# Patient Record
Sex: Male | Born: 1950 | Hispanic: Refuse to answer | Marital: Married | Smoking: Former smoker
Health system: Southern US, Community
[De-identification: ages and names within clinical notes are randomized; demographics above are authoritative.]

## PROBLEM LIST (undated history)

## (undated) DIAGNOSIS — H269 Unspecified cataract: Secondary | ICD-10-CM

## (undated) DIAGNOSIS — I2510 Atherosclerotic heart disease of native coronary artery without angina pectoris: Secondary | ICD-10-CM

## (undated) DIAGNOSIS — E785 Hyperlipidemia, unspecified: Secondary | ICD-10-CM

## (undated) DIAGNOSIS — D649 Anemia, unspecified: Secondary | ICD-10-CM

## (undated) DIAGNOSIS — B1920 Unspecified viral hepatitis C without hepatic coma: Secondary | ICD-10-CM

## (undated) DIAGNOSIS — J45909 Unspecified asthma, uncomplicated: Secondary | ICD-10-CM

## (undated) DIAGNOSIS — F209 Schizophrenia, unspecified: Secondary | ICD-10-CM

## (undated) DIAGNOSIS — T7840XA Allergy, unspecified, initial encounter: Secondary | ICD-10-CM

## (undated) DIAGNOSIS — F419 Anxiety disorder, unspecified: Secondary | ICD-10-CM

## (undated) DIAGNOSIS — K219 Gastro-esophageal reflux disease without esophagitis: Secondary | ICD-10-CM

## (undated) DIAGNOSIS — I10 Essential (primary) hypertension: Secondary | ICD-10-CM

## (undated) DIAGNOSIS — Z87891 Personal history of nicotine dependence: Secondary | ICD-10-CM

## (undated) HISTORY — DX: Gastro-esophageal reflux disease without esophagitis: K21.9

## (undated) HISTORY — DX: Unspecified cataract: H26.9

## (undated) HISTORY — DX: Anxiety disorder, unspecified: F41.9

## (undated) HISTORY — DX: Allergy, unspecified, initial encounter: T78.40XA

## (undated) HISTORY — DX: Atherosclerotic heart disease of native coronary artery without angina pectoris: I25.10

## (undated) HISTORY — DX: Personal history of nicotine dependence: Z87.891

## (undated) HISTORY — DX: Unspecified viral hepatitis C without hepatic coma: B19.20

## (undated) HISTORY — DX: Anemia, unspecified: D64.9

## (undated) HISTORY — DX: Unspecified asthma, uncomplicated: J45.909

---
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
## 1976-05-18 DIAGNOSIS — F209 Schizophrenia, unspecified: Secondary | ICD-10-CM

## 1976-05-18 HISTORY — DX: Schizophrenia, unspecified: F20.9

## 1992-05-18 HISTORY — PX: HERNIA REPAIR: SHX51

## 1995-06-09 HISTORY — PX: CORONARY ANGIOPLASTY WITH STENT PLACEMENT: SHX49

## 1997-11-21 ENCOUNTER — Emergency Department (HOSPITAL_COMMUNITY): Admission: EM | Admit: 1997-11-21 | Discharge: 1997-11-21 | Payer: Self-pay | Admitting: Emergency Medicine

## 1997-12-03 ENCOUNTER — Ambulatory Visit (HOSPITAL_COMMUNITY): Admission: RE | Admit: 1997-12-03 | Discharge: 1997-12-03 | Payer: Self-pay | Admitting: *Deleted

## 1999-05-19 HISTORY — PX: CARDIAC CATHETERIZATION: SHX172

## 1999-11-20 ENCOUNTER — Encounter: Payer: Self-pay | Admitting: *Deleted

## 1999-11-20 ENCOUNTER — Ambulatory Visit (HOSPITAL_COMMUNITY): Admission: RE | Admit: 1999-11-20 | Discharge: 1999-11-20 | Payer: Self-pay | Admitting: *Deleted

## 2000-09-06 ENCOUNTER — Ambulatory Visit (HOSPITAL_COMMUNITY): Admission: RE | Admit: 2000-09-06 | Discharge: 2000-09-06 | Payer: Self-pay | Admitting: *Deleted

## 2000-09-06 ENCOUNTER — Encounter (INDEPENDENT_AMBULATORY_CARE_PROVIDER_SITE_OTHER): Payer: Self-pay

## 2004-07-08 ENCOUNTER — Ambulatory Visit (HOSPITAL_COMMUNITY): Admission: RE | Admit: 2004-07-08 | Discharge: 2004-07-08 | Payer: Self-pay | Admitting: *Deleted

## 2004-07-22 ENCOUNTER — Ambulatory Visit: Payer: Self-pay | Admitting: Gastroenterology

## 2004-09-23 ENCOUNTER — Ambulatory Visit: Payer: Self-pay | Admitting: Internal Medicine

## 2006-10-08 ENCOUNTER — Ambulatory Visit: Payer: Self-pay | Admitting: Gastroenterology

## 2006-11-04 ENCOUNTER — Ambulatory Visit: Payer: Self-pay | Admitting: Gastroenterology

## 2006-11-17 ENCOUNTER — Ambulatory Visit: Payer: Self-pay | Admitting: Gastroenterology

## 2007-04-05 ENCOUNTER — Encounter: Admission: RE | Admit: 2007-04-05 | Discharge: 2007-04-05 | Payer: Self-pay | Admitting: Internal Medicine

## 2007-04-27 ENCOUNTER — Encounter: Admission: RE | Admit: 2007-04-27 | Discharge: 2007-05-18 | Payer: Self-pay | Admitting: Neurosurgery

## 2007-05-20 ENCOUNTER — Encounter: Admission: RE | Admit: 2007-05-20 | Discharge: 2007-08-18 | Payer: Self-pay | Admitting: Neurosurgery

## 2008-11-13 HISTORY — PX: TRANSTHORACIC ECHOCARDIOGRAM: SHX275

## 2009-04-12 ENCOUNTER — Emergency Department (HOSPITAL_BASED_OUTPATIENT_CLINIC_OR_DEPARTMENT_OTHER): Admission: EM | Admit: 2009-04-12 | Discharge: 2009-04-12 | Payer: Self-pay | Admitting: Emergency Medicine

## 2010-06-08 ENCOUNTER — Encounter: Payer: Self-pay | Admitting: *Deleted

## 2010-10-03 NOTE — Cardiovascular Report (Signed)
[place]  Patient:    Adam Peters, Adam Peters                  MRN: [id number] Proc. Date: 11/20/99 Adm. Date:  [id number] Attending:  Darlin Priestly CC:         Madaline Savage, M.D.                        Cardiac Catheterization  PROCEDURES: 1. Left heart catheterization. 2. Coronary angiography. 3. Left ventriculogram.  ATTENDING FOR THE CASE:  Lenise Herald, M.D.  COMPLICATIONS:  None.  INDICATIONS:  [name] is a 60 year old black male patient of Dr. Madaline Savage with a history of hypercholesterolemia, borderline hypertension, history of CAD, status post PTCA and stenting of his LAD January 1997. Recently, the patient has complained of intermittent palpitations and mild left shoulder discomfort.  He subsequently underwent a stress Cardiolite, which revealed ______ evidence of inferior wall scar and mild lateral wall ischemia with EF of 54%.  He is now referred for cardiac catheterization to reevaluate his coronaries.  DESCRIPTION OF OPERATION:  After giving informed written consent, the patient brought to the cardiac catheterization laboratory, where right and left groins are shaved, prepped and draped in usual sterile fashion.  ECG monitoring was established.  Using modified Seldinger technique, a #6-French arterial sheath was inserted into the right femoral artery without complications.  A 6-French diagnostic catheter was then used to performed diagnostic angiography.  This revealed a medium sized left main with no significant disease.  LAD was a large vessel, which coursed to the apex and gave rise to one bifurcating diagonal.  LAD was noted to have a 20% ostial lesion.  The proximal Palmaz-Schatz stent was noted to be patent with no evidence of significant in-stent restenosis.  There was a 50% stenotic lesion just beyond the stented segment.  There was an additional 40% stenotic segment in the mid LAD.  The remainder  of the LAD was coursely irregular, but has no significant disease. The first diagonal was a medium sized vessel, which bifurcated in its mid portion and has no significant disease.  The left circumflex was a medium sized vessel, which coursed in the A-V groove and gave rise to a large bifurcating obtuse marginal branch.  A-V groove circumflex has a 30% stenotic lesion.  The first OM was a large vessel, which bifurcated in its mid portion and is noted to have a 30% stenotic lesion in the upper branch of bifurcation and 20% lesion in the lower branch of the bifurcation.  The right coronary artery was a large vessel, which was dominant and gave rise to both PDA, as well as, a posterolateral branch.  The RCA was noted to have 40% mid vessel stenotic lesion.  PDA and posterolateral branch had no significant disease.  Left ventriculogram revealed preserved EF at 50%.  There was mild anterolateral and apical hypokinesis.  CONCLUSIONS: 1. No significant coronary artery disease. 2. Normal left ventricular systolic function with wall motion abnormalities    noted as above. 3. Mild systemic hypertension. DD:  11/20/99 TD:  11/20/99 Job: [id number]

## 2010-10-03 NOTE — Op Note (Signed)
NAME:  Adam Peters, Adam Peters           ACCOUNT NO.:  [id number]   MEDICAL RECORD NO.:  [id number]          PATIENT TYPE:  AMB   LOCATION:  ENDO                         FACILITY:  [facility]   PHYSICIAN:  Georgiana Spinner, M.D.    DATE OF BIRTH:  [birth year]   DATE OF PROCEDURE:  07/08/2004  DATE OF DISCHARGE:                                 OPERATIVE REPORT   PROCEDURE PERFORMED:  Upper endoscopy.   ENDOSCOPIST:  Georgiana Spinner, M.D.  we entered into the stomach.  Fundus, body antrum, duodenal bulb all  appeared normal.  The scope was slowly withdrawn taking circumferential  views of duodenum __________ placed on retroflexion to view the stomach from  below.  Endoscope was straightened and withdrawn taking circumferential  views ___________ Patient's vital signs and pulse oximeter remained stable.  The patient tolerated the procedure without apparent complication.   FINDINGS:  Other than a strap of the gastroesophageal junction, __________  examination.   PLAN:  The patient has liver biopsy scheduled for next week and followup  will be subsequent to that.      GMO/MEDQ  D:  07/08/2004  T:  07/08/2004  Job:  [id number]   cc:   Jonita Albee, M.D.  [facility]  [address]  [place]  [place] [address]  Fax: [phone]   Madaline Savage, M.D.  [phone] [address], [address]  [place]  [place] [address]  Fax: [phone]

## 2010-10-03 NOTE — Procedures (Signed)
[facility]  Patient:    Adam Peters, Adam Peters                  MRN: [id number] Proc. Date: 09/06/00 Adm. Date:  [id number] Attending:  Sabino Gasser                           Procedure Report  PROCEDURE:  Colonoscopy.  INDICATIONS:  Rectal bleeding.  ANESTHESIA:  Demerol 60 mg, Versed 6 mg.  DESCRIPTION OF PROCEDURE:  With the patient mildly sedated in the left lateral decubitus position, the Olympus videoscopic colonoscope was inserted into the rectum and passed under direct vision to the cecum, after a normal rectal examination.  The cecum was identified by the ileocecal valve and appendiceal orifice, both of which were photographed.  From this point the colonoscope was slowly withdrawn, taking circumferential views of the entire colonic mucosa and stopping only in the rectum; which appeared normal in direct view, other than a small polyp which was photographed and removed using hot biopsy forceps technique at settings of 3/3 blended current.  The endoscope was then placed in retroflexion to view the anal canal from above.  This appeared normal.  The endoscope was straightened and withdrawn.  The patients vital signs and pulse oximetry remained stable.  The patient tolerated the procedure well and without apparent complications.  FINDINGS:  Small polyp of the rectum, biopsied.  Await biopsy report.  Rare diverticulum of the sigmoid; otherwise unremarkable examination.  The patient will call me for results and follow up with me as an outpatient.  PLAN:  The patient will call in for results and follow up with me as an outpatient. DD:  09/06/00 TD:  09/06/00 Job: [id number]

## 2011-01-29 ENCOUNTER — Other Ambulatory Visit: Payer: Self-pay | Admitting: Gastroenterology

## 2011-01-29 ENCOUNTER — Ambulatory Visit (INDEPENDENT_AMBULATORY_CARE_PROVIDER_SITE_OTHER): Payer: Self-pay | Admitting: Gastroenterology

## 2011-01-29 DIAGNOSIS — B182 Chronic viral hepatitis C: Secondary | ICD-10-CM

## 2011-01-30 LAB — CBC WITH DIFFERENTIAL/PLATELET
Basophils Absolute: 0 10*3/uL (ref 0.0–0.1)
Basophils Relative: 0 % (ref 0–1)
Eosinophils Absolute: 0.1 10*3/uL (ref 0.0–0.7)
Eosinophils Relative: 2 % (ref 0–5)
HCT: 41.9 % (ref 39.0–52.0)
Hemoglobin: 14.3 g/dL (ref 13.0–17.0)
Lymphocytes Relative: 26 % (ref 12–46)
Lymphs Abs: 1.2 10*3/uL (ref 0.7–4.0)
MCH: 31.2 pg (ref 26.0–34.0)
MCHC: 34.1 g/dL (ref 30.0–36.0)
MCV: 91.5 fL (ref 78.0–100.0)
Monocytes Absolute: 0.3 10*3/uL (ref 0.1–1.0)
Monocytes Relative: 5 % (ref 3–12)
Neutro Abs: 3.1 10*3/uL (ref 1.7–7.7)
Neutrophils Relative %: 67 % (ref 43–77)
Platelets: 123 10*3/uL — ABNORMAL LOW (ref 150–400)
RBC: 4.58 MIL/uL (ref 4.22–5.81)
RDW: 12.3 % (ref 11.5–15.5)
WBC: 4.6 10*3/uL (ref 4.0–10.5)

## 2011-01-30 LAB — COMPREHENSIVE METABOLIC PANEL
ALT: 28 U/L (ref 0–53)
AST: 30 U/L (ref 0–37)
Albumin: 4.2 g/dL (ref 3.5–5.2)
Alkaline Phosphatase: 71 U/L (ref 39–117)
BUN: 14 mg/dL (ref 6–23)
CO2: 21 mEq/L (ref 19–32)
Calcium: 9.8 mg/dL (ref 8.4–10.5)
Chloride: 106 mEq/L (ref 96–112)
Creat: 0.83 mg/dL (ref 0.50–1.35)
Glucose, Bld: 92 mg/dL (ref 70–99)
Potassium: 4 mEq/L (ref 3.5–5.3)
Sodium: 139 mEq/L (ref 135–145)
Total Bilirubin: 0.9 mg/dL (ref 0.3–1.2)
Total Protein: 7.6 g/dL (ref 6.0–8.3)

## 2011-01-30 LAB — PROTIME-INR
INR: 1.07 (ref <1.50)
Prothrombin Time: 14.3 seconds (ref 11.6–15.2)

## 2011-01-30 LAB — IRON: Iron: 142 ug/dL (ref 42–165)

## 2011-01-30 LAB — AFP TUMOR MARKER: AFP-Tumor Marker: 4.3 ng/mL (ref 0.0–8.0)

## 2011-01-30 LAB — ANA: Anti Nuclear Antibody(ANA): NEGATIVE

## 2011-01-30 LAB — HEPATITIS A ANTIBODY, TOTAL: Hep A Total Ab: POSITIVE — AB

## 2011-01-30 LAB — TSH: TSH: 0.487 u[IU]/mL (ref 0.350–4.500)

## 2011-02-02 LAB — INTERLEUKIN 28B POLYMORPHISM GENOTYPE, RT-PCR

## 2011-02-06 LAB — HEPATITIS C GENOTYPE

## 2011-02-12 ENCOUNTER — Encounter: Payer: Self-pay | Admitting: Gastroenterology
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
## 2011-02-27 ENCOUNTER — Other Ambulatory Visit: Payer: Self-pay | Admitting: Gastroenterology

## 2011-03-10 LAB — LIVER FIBROSIS PANEL
GGT: 21 U/L (ref 3–70)
METAVIR F2: 2.6 %
METAVIR F3: 2.5 %
Total Bilirubin: 0.7 mg/dL (ref 0.2–1.2)

## 2011-05-04 ENCOUNTER — Encounter (INDEPENDENT_AMBULATORY_CARE_PROVIDER_SITE_OTHER): Payer: No Typology Code available for payment source | Admitting: Internal Medicine

## 2011-05-04 DIAGNOSIS — E782 Mixed hyperlipidemia: Secondary | ICD-10-CM

## 2011-05-04 DIAGNOSIS — I2510 Atherosclerotic heart disease of native coronary artery without angina pectoris: Secondary | ICD-10-CM

## 2011-05-04 DIAGNOSIS — Z23 Encounter for immunization: Secondary | ICD-10-CM

## 2011-05-04 DIAGNOSIS — B182 Chronic viral hepatitis C: Secondary | ICD-10-CM

## 2011-05-04 DIAGNOSIS — Z0000 Encounter for general adult medical examination without abnormal findings: Secondary | ICD-10-CM

## 2011-05-07 ENCOUNTER — Ambulatory Visit (INDEPENDENT_AMBULATORY_CARE_PROVIDER_SITE_OTHER): Payer: No Typology Code available for payment source | Admitting: Gastroenterology

## 2011-05-07 DIAGNOSIS — B182 Chronic viral hepatitis C: Secondary | ICD-10-CM

## 2011-05-14 NOTE — Progress Notes (Signed)
NAME:  Adam Peters, Adam Peters  MR#:  [id number]      DATE:  05/07/2011  DOB:  Jul 17, 1950    cc: Referring Physician:  Celedonio Savage, MD, [facility], [address], [place], [place] [address], Fax [phone]    REASON FOR VISIT:  Follow up of genotype 1a, IL-28B CT, hepatitis C.    History:  The patient returns today unaccompanied. Since last being seen on 01/29/2011, he has not had any symptoms referable to his history of  hepatitis C. There are no symptoms to suggest cryoglobulin mediated or decompensated liver disease.   PAST MEDICAL HISTORY:  Significant for coronary artery disease. He reports he underwent a stress test since last seen by [place], but I have not received any results from his primary physician regarding this.   CURRENT MEDICATIONS:  Aspirin 81 mg p.o. daily, which he sometimes misses a dose if he has GI upset, Allegra 180 mg daily, fish oil 1000 mg daily, Cialis p.r.n., Viagra p.r.n.   ALLERGIES:  Denies.   HABITS:  Quit smoking at the age of 46. Alcohol, consumes approximately 3 glasses of wine per week but no liquor or beer.   REVIEW OF SYSTEMS:  All 10 systems reviewed today with the patient and they are negative other than which was mentioned above. He did not complete the CES-D form.   PHYSICAL EXAMINATION:   Constitutional:  Well-appearing without stigmata of chronic liver disease. Vital signs: Height 74 inches, weight 228 pounds, blood pressure 151/98, pulse of 55, temperature 97.5 Fahrenheit.  Ears,  nose, mouth and throat:  Unremarkable oropharynx.  No thyromegaly or neck masses.  Chest:  Resonant to percussion.  Clear to auscultation.  Cardiovascular:  Heart sounds normal S1, S2 without murmurs or rubs.   There is no peripheral edema.  Abdominal:  Normal bowel sounds.  No masses or tenderness.  I could not appreciate a liver edge or spleen tip.  I could not appreciate any hernias.  Lymphatics:  No cervical or  inguinal lymphadenopathy.   Central Nervous System:  No asterixis or focal neurologic findings.  Dermatologic:  Anicteric without palmar  erythema or spider angiomata.  Eyes:  Anicteric sclerae.  Pupils are equal and reactive to light.   ASSESSMENT:  The patient is a 60 year old gentleman with genotype 1a hepatitis C, IL-28B CT, with a liver fibrosis panel on 02/27/2011, showing an equivalent of the Metavir score of F0 to F1. This indicates minimal  fibrosis. Given the lack of significant fibrosis on the equivalent of a biopsy and the fact that better tolerated and dosed therapy are expected within the latter half of 2013, I think the patient can  afford to wait until then to be treated.   In my discussion today with the patient, we discussed the results of all his lab testing, and the significance thereof. I have explained to him that given the previous lab testing on his hepatitis C that he can  afford to wait for the availability of something such as simeprevir, which will be dosed once daily, in addition to pegylated interferon and ribavirin rather than 3 times daily protease inhibitors as they  are dosed now. The side effect profile of simeprevir may be better than that of telaprevir or boceprevir. We also discussed all oral therapies, which made deliver better response rates with shorter  durations of therapy, which may be available in 2014 to 2016. The patient was in favor of waiting given what we discussed.  plan:  1. Hepatitis A and B immune. 2. I would ask that Dr. Jamas Lav' office fax [place] a copy of the stress test results, which I have not seen, to [phone]. 3. I expect to see the patient in a year's time. 4. I have suggested that he go back to [email or web address] to review that web site for the latest developments in hepatitis C treatments. He previously reviewed that website but not in the last few months and certainly not since the liver meetings when a lot of significant developments were announced.             Brooke Dare, MD   [phone]  D:  Thu Dec 20 16:41:33 2012 ; T:  Thu Dec 20 22:15:37 2012  Job #:  [id number]

## 2011-08-17 ENCOUNTER — Ambulatory Visit (INDEPENDENT_AMBULATORY_CARE_PROVIDER_SITE_OTHER): Payer: No Typology Code available for payment source | Admitting: Physician Assistant

## 2011-08-17 VITALS — BP 108/73 | HR 72 | Temp 99.5°F | Resp 18 | Ht 76.0 in | Wt 230.0 lb

## 2011-08-17 DIAGNOSIS — I2510 Atherosclerotic heart disease of native coronary artery without angina pectoris: Secondary | ICD-10-CM

## 2011-08-17 DIAGNOSIS — B9789 Other viral agents as the cause of diseases classified elsewhere: Secondary | ICD-10-CM

## 2011-08-17 DIAGNOSIS — R05 Cough: Secondary | ICD-10-CM

## 2011-08-17 DIAGNOSIS — R509 Fever, unspecified: Secondary | ICD-10-CM

## 2011-08-17 LAB — POCT CBC
Hemoglobin: 14.6 g/dL (ref 14.1–18.1)
Lymph, poc: 1 (ref 0.6–3.4)
MCHC: 31.7 g/dL — AB (ref 31.8–35.4)
MID (cbc): 0.4 (ref 0–0.9)
MPV: 8.6 fL (ref 0–99.8)
POC Granulocyte: 7.6 — AB (ref 2–6.9)
POC MID %: 4.8 %M (ref 0–12)
Platelet Count, POC: 217 10*3/uL (ref 142–424)
RBC: 4.84 M/uL (ref 4.69–6.13)

## 2011-08-17 LAB — POCT INFLUENZA A/B: Influenza A, POC: NEGATIVE

## 2011-08-17 MED ORDER — ACETAMINOPHEN 325 MG PO TABS: 1000.0000 mg | ORAL_TABLET | Freq: Once | ORAL | Status: DC

## 2011-08-17 MED ORDER — PROMETHAZINE-DM 6.25-15 MG/5ML PO SYRP: ORAL_SOLUTION | ORAL | Status: DC

## 2011-08-17 NOTE — Progress Notes (Signed)
  Subjective:    Patient ID: Adam Peters, male    DOB: [birth year], 61 y.o.   MRN: [id number]  HPI 61 yo AAM presents with acute onset fever and chills, sneezing, coughing, nausea, and body aches that began yesterday. (all clear mucus).   He had a flu shot in September 2012.  Review of Systems  All other systems reviewed and are negative.        Objective:   Physical Exam  Constitutional: He is oriented to person, place, and time. He appears well-developed and well-nourished.  HENT:  Head: Normocephalic and atraumatic.  Mouth/Throat: Oropharynx is clear and moist. No oropharyngeal exudate.       Clear rhinorrhea, throat is erythematous <2+  Neck: Normal range of motion. Neck supple.  Cardiovascular: Normal rate, regular rhythm and normal heart sounds.  Exam reveals no gallop and no friction rub.   No murmur heard. Pulmonary/Chest: Effort normal and breath sounds normal. No respiratory distress. He has no wheezes. He has no rales.  Lymphadenopathy:    He has no cervical adenopathy.  Neurological: He is alert and oriented to person, place, and time.  Skin: Skin is warm.  Psychiatric: He has a normal mood and affect. His behavior is normal.   Results for orders placed in visit on 08/17/11  POCT CBC      Component Value Range   WBC 9.0  4.6 - 10.2 (K/uL)   Lymph, poc 1.0  0.6 - 3.4    POC LYMPH PERCENT 11.0  10 - 50 (%L)   MID (cbc) 0.4  0 - 0.9    POC MID % 4.8  0 - 12 (%M)   POC Granulocyte 7.6 (*) 2 - 6.9    Granulocyte percent 84.2 (*) 37 - 80 (%G)   RBC 4.84  4.69 - 6.13 (M/uL)   Hemoglobin 14.6  14.1 - 18.1 (g/dL)   HCT, POC 14.7  82.9 - 53.7 (%)   MCV 95.1  80 - 97 (fL)   MCH, POC 30.2  27 - 31.2 (pg)   MCHC 31.7 (*) 31.8 - 35.4 (g/dL)   RDW, POC 56.2     Platelet Count, POC 217  142 - 424 (K/uL)   MPV 8.6  0 - 99.8 (fL)  POCT INFLUENZA A/B      Component Value Range   Influenza A, POC Negative     Influenza B, POC Negative            Assessment &  Plan:  Likely viral syndrome-flu type illness Fluids, rest.  OOW X 3 days

## 2011-08-17 NOTE — Patient Instructions (Signed)
Fluids, rest, tylenol or advil per pkg directions.  Return To Clinic if worsens at all!!!

## 2011-09-02 ENCOUNTER — Telehealth: Payer: Self-pay

## 2011-09-07 ENCOUNTER — Other Ambulatory Visit: Payer: Self-pay | Admitting: Internal Medicine

## 2011-09-07 NOTE — Telephone Encounter (Signed)
Patient seen recently, is this ok?

## 2011-09-28 ENCOUNTER — Ambulatory Visit (INDEPENDENT_AMBULATORY_CARE_PROVIDER_SITE_OTHER): Payer: No Typology Code available for payment source | Admitting: Family Medicine

## 2011-09-28 VITALS — BP 154/91 | HR 66 | Temp 97.7°F | Resp 18 | Ht 74.0 in | Wt 233.0 lb

## 2011-09-28 DIAGNOSIS — M25519 Pain in unspecified shoulder: Secondary | ICD-10-CM

## 2011-09-28 DIAGNOSIS — R059 Cough, unspecified: Secondary | ICD-10-CM

## 2011-09-28 DIAGNOSIS — R05 Cough: Secondary | ICD-10-CM

## 2011-09-28 DIAGNOSIS — J40 Bronchitis, not specified as acute or chronic: Secondary | ICD-10-CM

## 2011-09-28 DIAGNOSIS — M542 Cervicalgia: Secondary | ICD-10-CM

## 2011-09-28 MED ORDER — CYCLOBENZAPRINE HCL 5 MG PO TABS: ORAL_TABLET | ORAL | Status: AC

## 2011-09-28 MED ORDER — AZITHROMYCIN 250 MG PO TABS: ORAL_TABLET | ORAL | Status: AC

## 2011-09-28 MED ORDER — MELOXICAM 7.5 MG PO TABS: 7.5000 mg | ORAL_TABLET | Freq: Every day | ORAL | Status: DC

## 2011-09-28 NOTE — Patient Instructions (Addendum)
Start meloxicam as needed for pain/inflammation.  Continue mucinex for cough.  Azithromycin if not improving in few days. Elevated Blood Pressure today - check out of office - if remains above 140/90, stop meloxicam and return to clinic.  Recheck in the next 4- 6 weeks.   Return to the clinic or go to the nearest emergency room if any of your symptoms worsen or new symptoms occur.     Impingement Syndrome, Rotator Cuff, Bursitis with Rehab Impingement syndrome is a condition that involves inflammation of the tendons of the rotator cuff and the subacromial bursa, that causes pain in the shoulder. The rotator cuff consists of four tendons and muscles that control much of the shoulder and upper arm function. The subacromial bursa is a fluid filled sac that helps reduce friction between the rotator cuff and one of the bones of the shoulder (acromion). Impingement syndrome is usually an overuse injury that causes swelling of the bursa (bursitis), swelling of the tendon (tendonitis), and/or a tear of the tendon (strain). Strains are classified into three categories. Grade 1 strains cause pain, but the tendon is not lengthened. Grade 2 strains include a lengthened ligament, due to the ligament being stretched or partially ruptured. With grade 2 strains there is still function, although the function may be decreased. Grade 3 strains include a complete tear of the tendon or muscle, and function is usually impaired. SYMPTOMS   Pain around the shoulder, often at the outer portion of the upper arm.   Pain that gets worse with shoulder function, especially when reaching overhead or lifting.   Sometimes, aching when not using the arm.   Pain that wakes you up at night.   Sometimes, tenderness, swelling, warmth, or redness over the affected area.   Loss of strength.   Limited motion of the shoulder, especially reaching behind the back (to the back pocket or to unhook bra) or across your body.    Crackling sound (crepitation) when moving the arm.   Biceps tendon pain and inflammation (in the front of the shoulder). Worse when bending the elbow or lifting.  CAUSES  Impingement syndrome is often an overuse injury, in which chronic (repetitive) motions cause the tendons or bursa to become inflamed. A strain occurs when a force is paced on the tendon or muscle that is greater than it can withstand. Common mechanisms of injury include: Stress from sudden increase in duration, frequency, or intensity of training.  Direct hit (trauma) to the shoulder.   Aging, erosion of the tendon with normal use.   Bony bump on shoulder (acromial spur).  RISK INCREASES WITH:  Contact sports (football, wrestling, boxing).   Throwing sports (baseball, tennis, volleyball).   Weightlifting and bodybuilding.   Heavy labor.   Previous injury to the rotator cuff, including impingement.   Poor shoulder strength and flexibility.   Failure to warm up properly before activity.   Inadequate protective equipment.   Old age.   Bony bump on shoulder (acromial spur).  PREVENTION   Warm up and stretch properly before activity.   Allow for adequate recovery between workouts.   Maintain physical fitness:   Strength, flexibility, and endurance.   Cardiovascular fitness.   Learn and use proper exercise technique.  PROGNOSIS  If treated properly, impingement syndrome usually goes away within 6 weeks. Sometimes surgery is required.  RELATED COMPLICATIONS   Longer healing time if not properly treated, or if not given enough time to heal.   Recurring symptoms, that result in  a chronic condition.   Shoulder stiffness, frozen shoulder, or loss of motion.   Rotator cuff tendon tear.   Recurring symptoms, especially if activity is resumed too soon, with overuse, with a direct blow, or when using poor technique.  TREATMENT  Treatment first involves the use of ice and medicine, to reduce pain and  inflammation. The use of strengthening and stretching exercises may help reduce pain with activity. These exercises may be performed at home or with a therapist. If non-surgical treatment is unsuccessful after more than 6 months, surgery may be advised. After surgery and rehabilitation, activity is usually possible in 3 months.  MEDICATION  If pain medicine is needed, nonsteroidal anti-inflammatory medicines (aspirin and ibuprofen), or other minor pain relievers (acetaminophen), are often advised.   Do not take pain medicine for 7 days before surgery.   Prescription pain relievers may be given, if your caregiver thinks they are needed. Use only as directed and only as much as you need.   Corticosteroid injections may be given by your caregiver. These injections should be reserved for the most serious cases, because they may only be given a certain number of times.  HEAT AND COLD  Cold treatment (icing) should be applied for 10 to 15 minutes every 2 to 3 hours for inflammation and pain, and immediately after activity that aggravates your symptoms. Use ice packs or an ice massage.   Heat treatment may be used before performing stretching and strengthening activities prescribed by your caregiver, physical therapist, or athletic trainer. Use a heat pack or a warm water soak.  SEEK MEDICAL CARE IF:   Symptoms get worse or do not improve in 4 to 6 weeks, despite treatment.   New, unexplained symptoms develop. (Drugs used in treatment may produce side effects.)  EXERCISES  RANGE OF MOTION (ROM) AND STRETCHING EXERCISES - Impingement Syndrome (Rotator Cuff  Tendinitis, Bursitis) These exercises may help you when beginning to rehabilitate your injury. Your symptoms may go away with or without further involvement from your physician, physical therapist or athletic trainer. While completing these exercises, remember:   Restoring tissue flexibility helps normal motion to return to the joints. This  allows healthier, less painful movement and activity.   An effective stretch should be held for at least 30 seconds.   A stretch should never be painful. You should only feel a gentle lengthening or release in the stretched tissue.  STRETCH - Flexion, Standing  Stand with good posture. With an underhand grip on your right / left hand, and an overhand grip on the opposite hand, grasp a broomstick or cane so that your hands are a little more than shoulder width apart.   Keeping your right / left elbow straight and shoulder muscles relaxed, push the stick with your opposite hand, to raise your right / left arm in front of your body and then overhead. Raise your arm until you feel a stretch in your right / left shoulder, but before you have increased shoulder pain.   Try to avoid shrugging your right / left shoulder as your arm rises, by keeping your shoulder blade tucked down and toward your mid-back spine. Hold for __________ seconds.   Slowly return to the starting position.  Repeat __________ times. Complete this exercise __________ times per day. STRETCH - Abduction, Supine  Lie on your back. With an underhand grip on your right / left hand and an overhand grip on the opposite hand, grasp a broomstick or cane so that your  hands are a little more than shoulder width apart.   Keeping your right / left elbow straight and your shoulder muscles relaxed, push the stick with your opposite hand, to raise your right / left arm out to the side of your body and then overhead. Raise your arm until you feel a stretch in your right / left shoulder, but before you have increased shoulder pain.   Try to avoid shrugging your right / left shoulder as your arm rises, by keeping your shoulder blade tucked down and toward your mid-back spine. Hold for __________ seconds.   Slowly return to the starting position.  Repeat __________ times. Complete this exercise __________ times per day. ROM - Flexion,  Active-Assisted  Lie on your back. You may bend your knees for comfort.   Grasp a broomstick or cane so your hands are about shoulder width apart. Your right / left hand should grip the end of the stick, so that your hand is positioned "thumbs-up," as if you were about to shake hands.   Using your healthy arm to lead, raise your right / left arm overhead, until you feel a gentle stretch in your shoulder. Hold for __________ seconds.   Use the stick to assist in returning your right / left arm to its starting position.  Repeat __________ times. Complete this exercise __________ times per day.  ROM - Internal Rotation, Supine   Lie on your back on a firm surface. Place your right / left elbow about 60 degrees away from your side. Elevate your elbow with a folded towel, so that the elbow and shoulder are the same height.   Using a broomstick or cane and your strong arm, pull your right / left hand toward your body until you feel a gentle stretch, but no increase in your shoulder pain. Keep your shoulder and elbow in place throughout the exercise.   Hold for __________ seconds. Slowly return to the starting position.  Repeat __________ times. Complete this exercise __________ times per day. STRETCH - Internal Rotation  Place your right / left hand behind your back, palm up.   Throw a towel or belt over your opposite shoulder. Grasp the towel with your right / left hand.   While keeping an upright posture, gently pull up on the towel, until you feel a stretch in the front of your right / left shoulder.   Avoid shrugging your right / left shoulder as your arm rises, by keeping your shoulder blade tucked down and toward your mid-back spine.   Hold for __________ seconds. Release the stretch, by lowering your healthy hand.  Repeat __________ times. Complete this exercise __________ times per day. ROM - Internal Rotation   Using an underhand grip, grasp a stick behind your back with both  hands.   While standing upright with good posture, slide the stick up your back until you feel a mild stretch in the front of your shoulder.   Hold for __________ seconds. Slowly return to your starting position.  Repeat __________ times. Complete this exercise __________ times per day.  STRETCH - Posterior Shoulder Capsule   Stand or sit with good posture. Grasp your right / left elbow and draw it across your chest, keeping it at the same height as your shoulder.   Pull your elbow, so your upper arm comes in closer to your chest. Pull until you feel a gentle stretch in the back of your shoulder.   Hold for __________ seconds.  Repeat __________  times. Complete this exercise __________ times per day. STRENGTHENING EXERCISES - Impingement Syndrome (Rotator Cuff Tendinitis, Bursitis) These exercises may help you when beginning to rehabilitate your injury. They may resolve your symptoms with or without further involvement from your physician, physical therapist or athletic trainer. While completing these exercises, remember:  Muscles can gain both the endurance and the strength needed for everyday activities through controlled exercises.   Complete these exercises as instructed by your physician, physical therapist or athletic trainer. Increase the resistance and repetitions only as guided.   You may experience muscle soreness or fatigue, but the pain or discomfort you are trying to eliminate should never worsen during these exercises. If this pain does get worse, stop and make sure you are following the directions exactly. If the pain is still present after adjustments, discontinue the exercise until you can discuss the trouble with your clinician.   During your recovery, avoid activity or exercises which involve actions that place your injured hand or elbow above your head or behind your back or head. These positions stress the tissues which you are trying to heal.  STRENGTH - Scapular  Depression and Adduction   With good posture, sit on a firm chair. Support your arms in front of you, with pillows, arm rests, or on a table top. Have your elbows in line with the sides of your body.   Gently draw your shoulder blades down and toward your mid-back spine. Gradually increase the tension, without tensing the muscles along the top of your shoulders and the back of your neck.   Hold for __________ seconds. Slowly release the tension and relax your muscles completely before starting the next repetition.   After you have practiced this exercise, remove the arm support and complete the exercise in standing as well as sitting position.  Repeat __________ times. Complete this exercise __________ times per day.  STRENGTH - Shoulder Abductors, Isometric  With good posture, stand or sit about 4-6 inches from a wall, with your right / left side facing the wall.   Bend your right / left elbow. Gently press your right / left elbow into the wall. Increase the pressure gradually, until you are pressing as hard as you can, without shrugging your shoulder or increasing any shoulder discomfort.   Hold for __________ seconds.   Release the tension slowly. Relax your shoulder muscles completely before you begin the next repetition.  Repeat __________ times. Complete this exercise __________ times per day.  STRENGTH - External Rotators, Isometric  Keep your right / left elbow at your side and bend it 90 degrees.   Step into a door frame so that the outside of your right / left wrist can press against the door frame without your upper arm leaving your side.   Gently press your right / left wrist into the door frame, as if you were trying to swing the back of your hand away from your stomach. Gradually increase the tension, until you are pressing as hard as you can, without shrugging your shoulder or increasing any shoulder discomfort.   Hold for __________ seconds.   Release the tension slowly.  Relax your shoulder muscles completely before you begin the next repetition.  Repeat __________ times. Complete this exercise __________ times per day.  STRENGTH - Supraspinatus   Stand or sit with good posture. Grasp a __________ weight, or an exercise band or tubing, so that your hand is "thumbs-up," like you are shaking hands.   Slowly lift your  right / left arm in a "V" away from your thigh, diagonally into the space between your side and straight ahead. Lift your hand to shoulder height or as far as you can, without increasing any shoulder pain. At first, many people do not lift their hands above shoulder height.   Avoid shrugging your right / left shoulder as your arm rises, by keeping your shoulder blade tucked down and toward your mid-back spine.   Hold for __________ seconds. Control the descent of your hand, as you slowly return to your starting position.  Repeat __________ times. Complete this exercise __________ times per day.  STRENGTH - External Rotators  Secure a rubber exercise band or tubing to a fixed object (table, pole) so that it is at the same height as your right / left elbow when you are standing or sitting on a firm surface.   Stand or sit so that the secured exercise band is at your uninjured side.   Bend your right / left elbow 90 degrees. Place a folded towel or small pillow under your right / left arm, so that your elbow is a few inches away from your side.   Keeping the tension on the exercise band, pull it away from your body, as if pivoting on your elbow. Be sure to keep your body steady, so that the movement is coming only from your rotating shoulder.   Hold for __________ seconds. Release the tension in a controlled manner, as you return to the starting position.  Repeat __________ times. Complete this exercise __________ times per day.  STRENGTH - Internal Rotators   Secure a rubber exercise band or tubing to a fixed object (table, pole) so that it is at  the same height as your right / left elbow when you are standing or sitting on a firm surface.   Stand or sit so that the secured exercise band is at your right / left side.   Bend your elbow 90 degrees. Place a folded towel or small pillow under your right / left arm so that your elbow is a few inches away from your side.   Keeping the tension on the exercise band, pull it across your body, toward your stomach. Be sure to keep your body steady, so that the movement is coming only from your rotating shoulder.   Hold for __________ seconds. Release the tension in a controlled manner, as you return to the starting position.  Repeat __________ times. Complete this exercise __________ times per day.  STRENGTH - Scapular Protractors, Standing   Stand arms length away from a wall. Place your hands on the wall, keeping your elbows straight.   Begin by dropping your shoulder blades down and toward your mid-back spine.   To strengthen your protractors, keep your shoulder blades down, but slide them forward on your rib cage. It will feel as if you are lifting the back of your rib cage away from the wall. This is a subtle motion and can be challenging to complete. Ask your caregiver for further instruction, if you are not sure you are doing the exercise correctly.   Hold for __________ seconds. Slowly return to the starting position, resting the muscles completely before starting the next repetition.  Repeat __________ times. Complete this exercise __________ times per day. STRENGTH - Scapular Protractors, Supine  Lie on your back on a firm surface. Extend your right / left arm straight into the air while holding a __________ weight in your hand.  Keeping your head and back in place, lift your shoulder off the floor.   Hold for __________ seconds. Slowly return to the starting position, and allow your muscles to relax completely before starting the next repetition.  Repeat __________ times. Complete  this exercise __________ times per day. STRENGTH - Scapular Protractors, Quadruped  Get onto your hands and knees, with your shoulders directly over your hands (or as close as you can be, comfortably).   Keeping your elbows locked, lift the back of your rib cage up into your shoulder blades, so your mid-back rounds out. Keep your neck muscles relaxed.   Hold this position for __________ seconds. Slowly return to the starting position and allow your muscles to relax completely before starting the next repetition.  Repeat __________ times. Complete this exercise __________ times per day.  STRENGTH - Scapular Retractors  Secure a rubber exercise band or tubing to a fixed object (table, pole), so that it is at the height of your shoulders when you are either standing, or sitting on a firm armless chair.   With a palm down grip, grasp an end of the band in each hand. Straighten your elbows and lift your hands straight in front of you, at shoulder height. Step back, away from the secured end of the band, until it becomes tense.   Squeezing your shoulder blades together, draw your elbows back toward your sides, as you bend them. Keep your upper arms lifted away from your body throughout the exercise.   Hold for __________ seconds. Slowly ease the tension on the band, as you reverse the directions and return to the starting position.  Repeat __________ times. Complete this exercise __________ times per day. STRENGTH - Shoulder Extensors   Secure a rubber exercise band or tubing to a fixed object (table, pole) so that it is at the height of your shoulders when you are either standing, or sitting on a firm armless chair.   With a thumbs-up grip, grasp an end of the band in each hand. Straighten your elbows and lift your hands straight in front of you, at shoulder height. Step back, away from the secured end of the band, until it becomes tense.   Squeezing your shoulder blades together, pull your hands  down to the sides of your thighs. Do not allow your hands to go behind you.   Hold for __________ seconds. Slowly ease the tension on the band, as you reverse the directions and return to the starting position.  Repeat __________ times. Complete this exercise __________ times per day.  STRENGTH - Scapular Retractors and External Rotators   Secure a rubber exercise band or tubing to a fixed object (table, pole) so that it is at the height as your shoulders, when you are either standing, or sitting on a firm armless chair.   With a palm down grip, grasp an end of the band in each hand. Bend your elbows 90 degrees and lift your elbows to shoulder height, at your sides. Step back, away from the secured end of the band, until it becomes tense.   Squeezing your shoulder blades together, rotate your shoulders so that your upper arms and elbows remain stationary, but your fists travel upward to head height.   Hold for __________ seconds. Slowly ease the tension on the band, as you reverse the directions and return to the starting position.  Repeat __________ times. Complete this exercise __________ times per day.  STRENGTH - Scapular Retractors and External Rotators, Rowing  Secure a rubber exercise band or tubing to a fixed object (table, pole) so that it is at the height of your shoulders, when you are either standing, or sitting on a firm armless chair.   With a palm down grip, grasp an end of the band in each hand. Straighten your elbows and lift your hands straight in front of you, at shoulder height. Step back, away from the secured end of the band, until it becomes tense.   Step 1: Squeeze your shoulder blades together. Bending your elbows, draw your hands to your chest, as if you are rowing a boat. At the end of this motion, your hands and elbow should be at shoulder height and your elbows should be out to your sides.   Step 2: Rotate your shoulders, to raise your hands above your head. Your  forearms should be vertical and your upper arms should be horizontal.   Hold for __________ seconds. Slowly ease the tension on the band, as you reverse the directions and return to the starting position.  Repeat __________ times. Complete this exercise __________ times per day.  STRENGTH - Scapular Depressors  Find a sturdy chair without wheels, such as a dining room chair.   Keeping your feet on the floor, and your hands on the chair arms, lift your bottom up from the seat, and lock your elbows.   Keeping your elbows straight, allow gravity to pull your body weight down. Your shoulders will rise toward your ears.   Raise your body against gravity by drawing your shoulder blades down your back, shortening the distance between your shoulders and ears. Although your feet should always maintain contact with the floor, your feet should progressively support less body weight, as you get stronger.   Hold for __________ seconds. In a controlled and slow manner, lower your body weight to begin the next repetition.  Repeat __________ times. Complete this exercise __________ times per day.  Document Released: 05/04/2005 Document Revised: 04/23/2011 Document Reviewed: 08/16/2008 [facility] Patient Information 2012 [place], [place].

## 2011-09-28 NOTE — Progress Notes (Signed)
Subjective:    Patient ID: QUADRE BRISTOL, male    DOB: 04-Nov-1950, 61 y.o.   MRN: [id number]  HPI ANSEL FERRALL is a 61 y.o. male Chest congestion -  Watery nose, itchy burning eyes, chest congestion by about a week and a half.  No fever.  Dry cough.  Tx:  mucinex this am - slightly improved. Marland Kitchen  otc allegra - past 6 months.  On flonase - off for about a week.    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  Noticed Left shoulder felt weaker past few weeks. About a year ago - did reach back and hold onto ~700 pound unit while towing.  Does not remember any pain or injury.   Noticed some increased ache/pain with driving [place] this weekend.  Sore with certain positions. No prior shoulder surgery or injection.  R hand dominant.  No recent change in job activities. No tx's .  No hx PUD or intolerance to NSAids.  L great toe pain.  Few months. No recent change.     Review of Systems  Constitutional: Negative for fever and chills.  Respiratory: Positive for cough. Negative for shortness of breath.   Musculoskeletal: Positive for arthralgias.  and as in HPI     Objective:   Physical Exam  Constitutional: He is oriented to person, place, and time. He appears well-developed and well-nourished.  HENT:  Head: Normocephalic and atraumatic.  Right Ear: Tympanic membrane, external ear and ear canal normal.  Left Ear: Tympanic membrane, external ear and ear canal normal.  Nose: Mucosal edema present. No rhinorrhea. Right sinus exhibits no maxillary sinus tenderness and no frontal sinus tenderness. Left sinus exhibits no maxillary sinus tenderness and no frontal sinus tenderness.  Mouth/Throat: Oropharynx is clear and moist and mucous membranes are normal. No oropharyngeal exudate or posterior oropharyngeal erythema.  Eyes: Conjunctivae are normal. Pupils are equal, round, and reactive to light.  Neck: Neck supple.  Cardiovascular: Normal rate, regular rhythm,  normal heart sounds and intact distal pulses.   No murmur heard. Pulmonary/Chest: Effort normal and breath sounds normal. He has no wheezes. He has no rhonchi. He has no rales.  Abdominal: Soft. There is no tenderness.  Musculoskeletal:       Left shoulder: He exhibits spasm. He exhibits normal range of motion and no deformity.       Cervical back: He exhibits decreased range of motion. He exhibits no tenderness and no bony tenderness.       Back:       Full rtc strength.  Negative empty can,  positive Neer and Hawkins.   Able to reproduce some of pain (burning) with Neck extension and L lateral flexion.  full/equal arm strength.  Lymphadenopathy:    He has no cervical adenopathy.  Neurological: He is alert and oriented to person, place, and time.  Reflex Scores:      Tricep reflexes are 1+ on the right side and 1+ on the left side.      Bicep reflexes are 1+ on the right side and 1+ on the left side.      Brachioradialis reflexes are 1+ on the right side and 1+ on the left side. Skin: Skin is warm and dry. No rash noted.  Psychiatric: He has a normal mood and affect. His behavior is normal.      L great toe.  Thickened nail min ttp medial nail fold but no erythema  or discharge.    Assessment & Plan:  TREASURE INGRUM is a 61 y.o. male  Cough - allergic rhinitis with secondary bronchitis/URi.  Cont mucinex, sx care.  Zpak in next few days if not improving.  RTC precautions.  Neck-L shoulder pain - likely combo of OA - neck with radiculopathy and RTC tendinosis with impingement/bursitis. Trial mobic 7.5mg  QD prn, HEP per handout. Neck care manual. Consider subacromial injection if not improving.  Xrays deferred today, but if not improving - consider L shoulder and C spine next ov if needed. OOW today.  Elevated BP today - check out of office - if remains above 140/90, stop Mobic and return to clinic.  L great toe pain - recurrent - may be due to ingrown nail vs gout - plan on  recheck next few weeks, sooner if any redness or worsening.

## 2011-10-13 ENCOUNTER — Ambulatory Visit: Payer: No Typology Code available for payment source

## 2011-10-13 ENCOUNTER — Ambulatory Visit (INDEPENDENT_AMBULATORY_CARE_PROVIDER_SITE_OTHER): Payer: No Typology Code available for payment source | Admitting: Internal Medicine

## 2011-10-13 VITALS — BP 124/84 | HR 73 | Temp 98.0°F | Resp 16 | Ht 74.0 in | Wt 234.6 lb

## 2011-10-13 DIAGNOSIS — Z131 Encounter for screening for diabetes mellitus: Secondary | ICD-10-CM

## 2011-10-13 DIAGNOSIS — M4712 Other spondylosis with myelopathy, cervical region: Secondary | ICD-10-CM

## 2011-10-13 DIAGNOSIS — M25512 Pain in left shoulder: Secondary | ICD-10-CM

## 2011-10-13 DIAGNOSIS — M25519 Pain in unspecified shoulder: Secondary | ICD-10-CM

## 2011-10-13 DIAGNOSIS — M4722 Other spondylosis with radiculopathy, cervical region: Secondary | ICD-10-CM

## 2011-10-13 DIAGNOSIS — R202 Paresthesia of skin: Secondary | ICD-10-CM

## 2011-10-13 DIAGNOSIS — R209 Unspecified disturbances of skin sensation: Secondary | ICD-10-CM

## 2011-10-13 MED ORDER — PREDNISONE 10 MG PO TABS: ORAL_TABLET | ORAL | Status: DC

## 2011-10-13 MED ORDER — METHYLPREDNISOLONE ACETATE 80 MG/ML IJ SUSP
120.0000 mg | Freq: Once | INTRAMUSCULAR | Status: AC
  Administered 2011-10-13: 120 mg via INTRAMUSCULAR

## 2011-10-13 MED ORDER — OMEPRAZOLE 20 MG PO CPDR: 20.0000 mg | DELAYED_RELEASE_CAPSULE | Freq: Every day | ORAL | Status: DC

## 2011-10-13 MED ORDER — METAXALONE 800 MG PO TABS: 800.0000 mg | ORAL_TABLET | Freq: Three times a day (TID) | ORAL | Status: AC

## 2011-10-13 NOTE — Progress Notes (Signed)
  Subjective:    Patient ID: Adam Peters, male    DOB: [birth year], 61 y.o.   MRN: [id number]  HPI See last OV.  Patient was here ~2weeks ago and was treated for shoulder pain.  He has continued to have significant L shoulder pain.  He also has paresthesias in his L arm that come and go.  Certain movement exacerbates the paresthesias.  Also certain neck movements cause the paresthesias.  He has not had any weakness.  There was no specific injury that he knows of, but he does a lot of repetitive work and lifting/moving boxes at this job. He has not noticed any weakness or dropping things.  The paresthesia go from his L trapezius down to his fingertips.  Review of Systems  All other systems reviewed and are negative.       Objective:   Physical Exam  Nursing note and vitals reviewed. Constitutional: He is oriented to person, place, and time. He appears well-developed and well-nourished.  HENT:  Head: Normocephalic and atraumatic.  Neck: Neck supple.  Cardiovascular: Normal rate, regular rhythm and normal heart sounds.   Pulmonary/Chest: Effort normal and breath sounds normal.  Musculoskeletal:       Left shoulder: He exhibits decreased range of motion (unable to lift more than 90 degrees without pain), pain and spasm (L trapezius spasm). He exhibits no tenderness, no bony tenderness, no swelling, no effusion, no crepitus, no deformity, normal pulse and normal strength.  Neurological: He is alert and oriented to person, place, and time. He has normal strength. He displays no atrophy. He exhibits normal muscle tone.  Reflex Scores:      Tricep reflexes are 1+ on the right side and 1+ on the left side.      Bicep reflexes are 0 on the right side and 1+ on the left side.      Brachioradialis reflexes are 1+ on the right side and 1+ on the left side. Skin: Skin is warm and dry.  Normal grip B.  L shoulder joint is stable without rotator cuff signs.    Results for orders placed in  visit on 10/13/11  GLUCOSE, POCT (MANUAL RESULT ENTRY)      Component Value Range   POC Glucose 95  70 - 99 (mg/dl)    [facility] reading (PRIMARY) by  Dr. Regan Rakers w/th cervical spondylosis and degenerative changes.  Shoulder-NAD       Assessment & Plan:  Cervical spondylosis with radiculopathy.  Patient was seen and examined by myself and Dr. Perrin Maltese.  We both performed physical and neurological exams to delineate whether his problem was coming from his neck.  We discussed the pathophysiology of this diagnosis at length.  Spent >45 mins face to face with patient. We are going to try conservative management and light duty work.  If any weakness develops, he should RTC immediately and proceed to MRI C-spine.

## 2011-10-27 ENCOUNTER — Ambulatory Visit (INDEPENDENT_AMBULATORY_CARE_PROVIDER_SITE_OTHER): Payer: No Typology Code available for payment source | Admitting: Internal Medicine

## 2011-10-27 VITALS — BP 157/89 | HR 60 | Temp 97.6°F | Resp 16 | Ht 75.0 in | Wt 232.0 lb

## 2011-10-27 DIAGNOSIS — M542 Cervicalgia: Secondary | ICD-10-CM

## 2011-10-27 DIAGNOSIS — B1920 Unspecified viral hepatitis C without hepatic coma: Secondary | ICD-10-CM

## 2011-10-27 NOTE — Progress Notes (Signed)
  Subjective:    Patient ID: Adam Peters, male    DOB: May 24, 1950, 61 y.o.   MRN: [id number]  HPI  Neck pain no better after course of steroids.  Review of Systems     Objective:   Physical Exam Unchanged Positive spurling test       Assessment & Plan:  MRI cspine

## 2011-11-01 ENCOUNTER — Ambulatory Visit
Admission: RE | Admit: 2011-11-01 | Discharge: 2011-11-01 | Disposition: A | Discharge status: Home / Self Care | Payer: No Typology Code available for payment source | Source: Ambulatory Visit | Attending: Internal Medicine | Admitting: Internal Medicine

## 2011-11-01 DIAGNOSIS — M542 Cervicalgia: Secondary | ICD-10-CM

## 2011-11-06 ENCOUNTER — Other Ambulatory Visit: Payer: Self-pay | Admitting: Internal Medicine

## 2011-11-06 DIAGNOSIS — M542 Cervicalgia: Secondary | ICD-10-CM

## 2011-11-23 ENCOUNTER — Ambulatory Visit: Payer: No Typology Code available for payment source | Admitting: Physical Therapy

## 2011-12-02 ENCOUNTER — Ambulatory Visit: Payer: No Typology Code available for payment source | Attending: Neurosurgery | Admitting: Physical Therapy

## 2011-12-02 DIAGNOSIS — M5412 Radiculopathy, cervical region: Secondary | ICD-10-CM | POA: Insufficient documentation

## 2011-12-02 DIAGNOSIS — M5030 Other cervical disc degeneration, unspecified cervical region: Secondary | ICD-10-CM | POA: Insufficient documentation

## 2011-12-02 DIAGNOSIS — M47812 Spondylosis without myelopathy or radiculopathy, cervical region: Secondary | ICD-10-CM | POA: Insufficient documentation

## 2011-12-02 DIAGNOSIS — M542 Cervicalgia: Secondary | ICD-10-CM | POA: Insufficient documentation

## 2011-12-02 DIAGNOSIS — M2569 Stiffness of other specified joint, not elsewhere classified: Secondary | ICD-10-CM | POA: Insufficient documentation

## 2011-12-02 DIAGNOSIS — IMO0001 Reserved for inherently not codable concepts without codable children: Secondary | ICD-10-CM | POA: Insufficient documentation

## 2011-12-07 ENCOUNTER — Ambulatory Visit: Payer: No Typology Code available for payment source | Admitting: Rehabilitation

## 2011-12-10 ENCOUNTER — Ambulatory Visit: Payer: No Typology Code available for payment source | Admitting: Rehabilitation

## 2011-12-11 ENCOUNTER — Ambulatory Visit: Payer: No Typology Code available for payment source | Admitting: Rehabilitation

## 2011-12-14 ENCOUNTER — Ambulatory Visit: Payer: No Typology Code available for payment source | Admitting: Rehabilitation

## 2011-12-16 ENCOUNTER — Ambulatory Visit: Payer: No Typology Code available for payment source | Admitting: Physical Therapy

## 2011-12-18 ENCOUNTER — Encounter: Payer: No Typology Code available for payment source | Admitting: Rehabilitation

## 2011-12-19 ENCOUNTER — Other Ambulatory Visit: Payer: Self-pay | Admitting: Physician Assistant

## 2011-12-21 ENCOUNTER — Ambulatory Visit: Payer: No Typology Code available for payment source | Attending: Neurosurgery | Admitting: Rehabilitation

## 2011-12-21 DIAGNOSIS — M2569 Stiffness of other specified joint, not elsewhere classified: Secondary | ICD-10-CM | POA: Insufficient documentation

## 2011-12-21 DIAGNOSIS — M5412 Radiculopathy, cervical region: Secondary | ICD-10-CM | POA: Insufficient documentation

## 2011-12-21 DIAGNOSIS — IMO0001 Reserved for inherently not codable concepts without codable children: Secondary | ICD-10-CM | POA: Insufficient documentation

## 2011-12-21 DIAGNOSIS — M542 Cervicalgia: Secondary | ICD-10-CM | POA: Insufficient documentation

## 2011-12-21 DIAGNOSIS — M47812 Spondylosis without myelopathy or radiculopathy, cervical region: Secondary | ICD-10-CM | POA: Insufficient documentation

## 2011-12-21 DIAGNOSIS — M5030 Other cervical disc degeneration, unspecified cervical region: Secondary | ICD-10-CM | POA: Insufficient documentation

## 2011-12-23 ENCOUNTER — Encounter: Payer: No Typology Code available for payment source | Admitting: Rehabilitation

## 2011-12-23 ENCOUNTER — Ambulatory Visit: Payer: No Typology Code available for payment source | Admitting: Rehabilitation

## 2011-12-24 ENCOUNTER — Ambulatory Visit: Payer: No Typology Code available for payment source | Admitting: Rehabilitation

## 2011-12-25 ENCOUNTER — Ambulatory Visit: Payer: No Typology Code available for payment source | Admitting: Rehabilitation

## 2011-12-28 ENCOUNTER — Ambulatory Visit: Payer: No Typology Code available for payment source | Admitting: Rehabilitation

## 2011-12-30 ENCOUNTER — Ambulatory Visit: Payer: No Typology Code available for payment source | Admitting: Physical Therapy

## 2012-01-01 ENCOUNTER — Ambulatory Visit: Payer: No Typology Code available for payment source | Admitting: Rehabilitation

## 2012-01-07 ENCOUNTER — Ambulatory Visit: Payer: No Typology Code available for payment source | Admitting: Rehabilitation

## 2012-01-23 ENCOUNTER — Other Ambulatory Visit: Payer: Self-pay | Admitting: Physician Assistant

## 2012-02-26 ENCOUNTER — Encounter: Payer: Self-pay | Admitting: Family Medicine

## 2012-02-26 DIAGNOSIS — M5030 Other cervical disc degeneration, unspecified cervical region: Secondary | ICD-10-CM

## 2012-03-17 ENCOUNTER — Encounter: Payer: Self-pay | Admitting: *Deleted

## 2012-03-21 ENCOUNTER — Other Ambulatory Visit: Payer: Self-pay | Admitting: Physician Assistant

## 2012-04-08 ENCOUNTER — Ambulatory Visit (INDEPENDENT_AMBULATORY_CARE_PROVIDER_SITE_OTHER): Payer: No Typology Code available for payment source | Admitting: Physician Assistant

## 2012-04-08 VITALS — BP 128/82 | HR 86 | Temp 98.6°F | Resp 18 | Ht 75.25 in | Wt 235.2 lb

## 2012-04-08 DIAGNOSIS — K602 Anal fissure, unspecified: Secondary | ICD-10-CM

## 2012-04-08 DIAGNOSIS — H409 Unspecified glaucoma: Secondary | ICD-10-CM | POA: Insufficient documentation

## 2012-04-08 MED ORDER — LIDOCAINE (ANORECTAL) 5 % EX GEL: 1.0000 "application " | Freq: Three times a day (TID) | CUTANEOUS | Status: DC

## 2012-04-08 MED ORDER — DILTIAZEM GEL 2 %: 1.0000 "application " | Freq: Two times a day (BID) | CUTANEOUS | Status: DC

## 2012-04-08 NOTE — Patient Instructions (Signed)
Anal Fissure, Adult  An anal fissure is a small tear or crack in the skin around the anus. Bleeding from a fissure usually stops on its own within a few minutes. However, bleeding will often reoccur with each bowel movement until the crack heals.   CAUSES    Passing large, hard stools.   Frequent diarrheal stools.   Constipation.   Inflammatory bowel disease (Crohn's disease or ulcerative colitis).   Infections.   Anal sex.  SYMPTOMS    Small amounts of blood seen on your stools, on toilet paper, or in the toilet after a bowel movement.   Rectal bleeding.   Painful bowel movements.   Itching or irritation around the anus.  DIAGNOSIS  Your caregiver will examine the anal area. An anal fissure can usually be seen with careful inspection. A rectal exam may be performed and a short tube (anoscope) may be used to examine the anal canal.  TREATMENT    You may be instructed to take fiber supplements. These supplements can soften your stool to help make bowel movements easier.   Sitz baths may be recommended to help heal the tear. Do not use soap in the sitz baths.   A medicated cream or ointment may be prescribed to lessen discomfort.  HOME CARE INSTRUCTIONS    Maintain a diet high in fruits, whole grains, and vegetables. Avoid constipating foods like bananas and dairy products.   Take sitz baths as directed by your caregiver.   Drink enough fluids to keep your urine clear or pale yellow.   Only take over-the-counter or prescription medicines for pain, discomfort, or fever as directed by your caregiver. Do not take aspirin as this may increase bleeding.   Do not use ointments containing numbing medications (anesthetics) or hydrocortisone. They could slow healing.  SEEK MEDICAL CARE IF:    Your fissure is not completely healed within 3 days.   You have further bleeding.   You have a fever.   You have diarrhea mixed with blood.   You have pain.   Your problem is getting worse rather than  better.  MAKE SURE YOU:    Understand these instructions.   Will watch your condition.   Will get help right away if you are not doing well or get worse.  Document Released: 05/04/2005 Document Revised: 07/27/2011 Document Reviewed: 10/19/2010  Exit[employer] Patient Information 2013 [employer].

## 2012-04-08 NOTE — Progress Notes (Signed)
   [address] [place] [address]   Phone [phone]  Subjective:    Patient ID: Adam Peters, male    DOB: March 11, 1951, 61 y.o.   MRN: [id number]  HPI  Pt presents to clinic with concerns regarding hemorrhoids.  He has had for the last 6 months - and he concerned because they are not getting better.  She has been using OTC prep H but he is still having problems.  He has seen blood on toilet tissue and he has pain with BMs.  He thinks his problem is from rough toliet paper at work.  He has not had problems with constipation.  Review of Systems  Gastrointestinal: Positive for blood in stool (on toliet tissue) and anal bleeding. Negative for diarrhea and constipation.       Objective:   Physical Exam  Vitals reviewed. Constitutional: He is oriented to person, place, and time. He appears well-developed and well-nourished.  HENT:  Head: Normocephalic and atraumatic.  Right Ear: External ear normal.  Left Ear: External ear normal.  Cardiovascular: Normal rate, regular rhythm and normal heart sounds.   Pulmonary/Chest: Effort normal.  Genitourinary: Rectal exam shows fissure (posterior area at 6 o'clock). Rectal exam shows no external hemorrhoid.  Neurological: He is alert and oriented to person, place, and time.  Skin: Skin is warm and dry.  Psychiatric: He has a normal mood and affect. His behavior is normal. Judgment and thought content normal.       Assessment & Plan:   1. Anal fissure  Lidocaine, Anorectal, 5 % GEL, diltiazem 2 % GEL   D/w pt to use moist wipes to decrease friction from rough toilet paper.  Use above gels to help with treatment.  Stop prep H. F/u if problems.

## 2012-05-09 ENCOUNTER — Encounter: Payer: No Typology Code available for payment source | Admitting: Internal Medicine

## 2012-05-15 ENCOUNTER — Other Ambulatory Visit: Payer: Self-pay | Admitting: Physician Assistant

## 2012-06-06 ENCOUNTER — Encounter: Payer: No Typology Code available for payment source | Admitting: Internal Medicine

## 2012-06-19 ENCOUNTER — Other Ambulatory Visit: Payer: Self-pay | Admitting: Physician Assistant

## 2012-07-08 ENCOUNTER — Ambulatory Visit (INDEPENDENT_AMBULATORY_CARE_PROVIDER_SITE_OTHER): Payer: No Typology Code available for payment source | Admitting: Family Medicine

## 2012-07-08 ENCOUNTER — Encounter: Payer: Self-pay | Admitting: Family Medicine

## 2012-07-08 VITALS — BP 140/80 | HR 58 | Temp 98.1°F | Resp 16 | Ht 75.0 in | Wt 239.0 lb

## 2012-07-08 DIAGNOSIS — N529 Male erectile dysfunction, unspecified: Secondary | ICD-10-CM

## 2012-07-08 DIAGNOSIS — Z760 Encounter for issue of repeat prescription: Secondary | ICD-10-CM

## 2012-07-08 DIAGNOSIS — Z0000 Encounter for general adult medical examination without abnormal findings: Secondary | ICD-10-CM

## 2012-07-08 DIAGNOSIS — Z13220 Encounter for screening for lipoid disorders: Secondary | ICD-10-CM

## 2012-07-08 DIAGNOSIS — Z23 Encounter for immunization: Secondary | ICD-10-CM

## 2012-07-08 LAB — IFOBT (OCCULT BLOOD): IFOBT: NEGATIVE

## 2012-07-08 LAB — POCT URINALYSIS DIPSTICK
Ketones, UA: NEGATIVE
Protein, UA: 30
Spec Grav, UA: >=1.03
Urobilinogen, UA: 1
pH, UA: 5.5

## 2012-07-08 MED ORDER — TRIAMCINOLONE ACETONIDE 0.025 % EX CREA: TOPICAL_CREAM | Freq: Two times a day (BID) | CUTANEOUS | Status: DC

## 2012-07-08 MED ORDER — SILDENAFIL CITRATE 100 MG PO TABS: 100.0000 mg | ORAL_TABLET | Freq: Every day | ORAL | Status: DC | PRN

## 2012-07-08 NOTE — Patient Instructions (Addendum)
Keeping you healthy  Get these tests  Blood pressure- Have your blood pressure checked once a year by your healthcare provider.  Normal blood pressure is 120/80  Weight- Have your body mass index (BMI) calculated to screen for obesity.  BMI is a measure of body fat based on height and weight. You can also calculate your own BMI at [email or web address].  Cholesterol- Have your cholesterol checked every year.  Diabetes- Have your blood sugar checked regularly if you have high blood pressure, high cholesterol, have a family history of diabetes or if you are overweight.  Screening for Colon Cancer- Colonoscopy starting at age 67.  Screening may begin sooner depending on your family history and other health conditions. Follow up colonoscopy as directed by your Gastroenterologist.  Screening for Prostate Cancer- Both blood work (PSA) and a rectal exam help screen for Prostate Cancer.  Screening begins at age 6 with African-American men and at age 26 with Caucasian men.  Screening may begin sooner depending on your family history.  Take these medicines  Aspirin- One aspirin daily can help prevent Heart disease and Stroke.  Flu shot- Every fall.  Tetanus- Every 10 years. You received Tdap today; next Tetanus is due in 2024.  Zostavax- Once after the age of 28 to prevent Shingles. You have had this vaccine already.  Pneumonia shot- Once after the age of 85; if you are younger than 52, ask your healthcare provider if you need a Pneumonia shot.  Take these steps  Don't smoke- If you do smoke, talk to your doctor about quitting.  For tips on how to quit, go to www.smokefree.gov or call 1-800-QUIT-NOW.  Be physically active- Exercise 5 days a week for at least 30 minutes.  If you are not already physically active start slow and gradually work up to 30 minutes of moderate physical activity.  Examples of moderate activity include walking briskly, mowing the yard, dancing, swimming, bicycling,  etc.  Eat a healthy diet- Eat a variety of healthy food such as fruits, vegetables, low fat milk, low fat cheese, yogurt, lean meant, poultry, fish, beans, tofu, etc. For more information go to www.thenutritionsource.org  Drink alcohol in moderation- Limit alcohol intake to less than two drinks a day. Never drink and drive.  Dentist- Brush and floss twice daily; visit your dentist twice a year.  Depression- Your emotional health is as important as your physical health. If you're feeling down, or losing interest in things you would normally enjoy please talk to your healthcare provider.  Eye exam- Visit your eye doctor every year.  Safe sex- If you may be exposed to a sexually transmitted infection, use a condom.  Seat belts- Seat belts can save your life; always wear one.  Smoke/Carbon Monoxide detectors- These detectors need to be installed on the appropriate level of your home.  Replace batteries at least once a year.  Skin cancer- When out in the sun, cover up and use sunscreen 15 SPF or higher.  Violence- If anyone is threatening you, please tell your healthcare provider.  Living Will/ Health care power of attorney- Speak with your healthcare provider and family.

## 2012-07-08 NOTE — Progress Notes (Signed)
Subjective:    Patient ID: Adam Peters, male    DOB: Jul 26, 1950, 62 y.o.   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 AA male w/ a hx of CAD, s/p stenting of  major artery in 1995. He stopped smoking at that time. He has chronic Hep C, followed by  Dr. Jacqualine Mau.  He has had colonoscopy x 2, last one at age 62 (normal) with [facility].  He had ECG done at Cardiology visit ~ 1 month ago.    He is married and enjoys regular fitness routine     Review of Systems  HENT: Positive for dental problem.   All other systems reviewed and are negative.       Objective:   Physical Exam  Nursing note and vitals reviewed. Constitutional: He is oriented to person, place, and time. Vital signs are normal. He appears well-developed and well-nourished. No distress.  HENT:  Head: Normocephalic and atraumatic.  Right Ear: Tympanic membrane, external ear and ear canal normal.  Left Ear: Hearing, tympanic membrane, external ear and ear canal normal.  Nose: Nose normal. No nasal deformity or septal deviation.  Mouth/Throat: Uvula is midline, oropharynx is clear and moist and mucous membranes are normal. No oral lesions. Abnormal dentition. No dental abscesses.  EACs- excess cerumen.  Eyes: Conjunctivae, EOM and lids are normal. Pupils are equal, round, and reactive to light. No scleral icterus.  Muddy sclerae; fundi not well visualized.  Neck: Normal range of motion. Neck supple. No JVD present. Thyromegaly present.  Cardiovascular: Normal rate, regular rhythm, normal heart sounds and intact distal pulses.  Exam reveals no gallop and no friction rub.   No murmur heard. Pulmonary/Chest: Effort normal and breath sounds normal. No respiratory distress. He exhibits no tenderness.  Abdominal: Soft. Normal appearance and normal aorta. He exhibits no distension, no pulsatile midline mass and no mass. Bowel sounds are decreased. There is no hepatosplenomegaly. There is no  tenderness. There is no guarding and no CVA tenderness.  Genitourinary: Rectum normal and prostate normal. Rectal exam shows no external hemorrhoid, no fissure, no mass, no tenderness and anal tone normal. Guaiac negative stool.  Musculoskeletal: Normal range of motion. He exhibits no edema and no tenderness.  Lymphadenopathy:    He has no cervical adenopathy.       Right: No inguinal adenopathy present.       Left: No inguinal adenopathy present.  Neurological: He is alert and oriented to person, place, and time. He has normal strength. No cranial nerve deficit or sensory deficit. He exhibits normal muscle tone. Coordination and gait normal.  Reflex Scores:      Tricep reflexes are 1+ on the right side and 1+ on the left side.      Bicep reflexes are 1+ on the right side and 1+ on the left side.      Patellar reflexes are 1+ on the right side and 1+ on the left side. Skin: Skin is warm and dry. No rash noted. No erythema.  Psychiatric: He has a normal mood and affect. His behavior is normal. Judgment and thought content normal.    Results for orders placed in visit on 07/08/12  IFOBT (OCCULT BLOOD)      Result Value Range   IFOBT Negative    POCT URINALYSIS DIPSTICK      Result Value Range   Color, UA yellow     Clarity, UA clear  Glucose, UA neg     Bilirubin, UA small     Ketones, UA neg     Spec Grav, UA >=1.030     Blood, UA trace     pH, UA 5.5     Protein, UA 30     Urobilinogen, UA 1.0     Nitrite, UA neg     Leukocytes, UA Negative      ECG: Sinus bradycardia; otherwise normal. No ST-TW changes, no ectopy.       Assessment & Plan:  Routine general medical examination at a health care facility - overall general health is good.  Plan: IFOBT POC (occult bld, rslt in office), Basic metabolic panel, EKG 12-Lead, PSA, TSH, POCT urinalysis dipstick  Need for prophylactic vaccination with combined diphtheria-tetanus-pertussis (DTP) vaccine - Plan: Tdap vaccine greater  than or equal to 7yo IM  Issue of repeat prescriptions  Need for lipid screening - Plan: Lipid panel  Erectile dysfunction  Meds ordered this encounter  Medications         . triamcinolone (KENALOG) 0.025 % cream    Sig: Apply topically 2 (two) times daily.    Dispense:  454 g    Refill:  2  . sildenafil (VIAGRA) 100 MG tablet    Sig: Take 1 tablet (100 mg total) by mouth daily as needed for erectile dysfunction.    Dispense:  10 tablet    Refill:  5

## 2012-07-09 LAB — BASIC METABOLIC PANEL
BUN: 15 mg/dL (ref 6–23)
Chloride: 108 mEq/L (ref 96–112)
Glucose, Bld: 105 mg/dL — ABNORMAL HIGH (ref 70–99)
Potassium: 4.2 mEq/L (ref 3.5–5.3)
Sodium: 139 mEq/L (ref 135–145)

## 2012-07-09 LAB — LIPID PANEL: Cholesterol: 189 mg/dL (ref 0–200)

## 2012-07-10 ENCOUNTER — Encounter: Payer: Self-pay | Admitting: Family Medicine

## 2012-07-11 ENCOUNTER — Telehealth: Payer: Self-pay | Admitting: Family Medicine

## 2012-07-11 ENCOUNTER — Encounter: Payer: Self-pay | Admitting: Family Medicine

## 2012-07-11 NOTE — Telephone Encounter (Signed)
I like this system. I can access you at anytime. Thank you Dr. Audria Nine and I look forward to seeing you again. I did not get an appointment for my next visit. Sent from my iPad. (via pt advice request)

## 2013-01-26 ENCOUNTER — Ambulatory Visit (INDEPENDENT_AMBULATORY_CARE_PROVIDER_SITE_OTHER): Payer: No Typology Code available for payment source | Admitting: Emergency Medicine

## 2013-01-26 VITALS — BP 144/80 | HR 70 | Temp 99.1°F | Resp 18 | Ht 75.0 in | Wt 233.4 lb

## 2013-01-26 DIAGNOSIS — R030 Elevated blood-pressure reading, without diagnosis of hypertension: Secondary | ICD-10-CM

## 2013-01-26 NOTE — Patient Instructions (Addendum)

## 2013-01-26 NOTE — Progress Notes (Signed)
[facility] [address] [place] [address] [phone]  Date:  01/26/2013   Name:  Adam Peters   DOB:  06/30/1950   MRN:  [id number]  PCP:  No primary provider on file.    Chief Complaint: Blood Pressure Check   History of Present Illness:  Adam Peters is a 62 y.o. very pleasant male patient who presents with the following:  Concerned that he has had elevated blood pressure on a public machine.  No history of documented elevated pressure looking back on his notes.  No symptoms of end organ injury. Non smoker.  Works out daily and is retired.  No improvement with over the counter medications or other home remedies. Denies other complaint or health concern today.   Patient Active Problem List   Diagnosis Date Noted  . History of tobacco use 07/08/2012  . Glaucoma 04/08/2012  . DDD (degenerative disc disease), cervical   . Hepatitis C 10/27/2011  . CAD (coronary artery disease) 08/17/2011    Past Medical History  Diagnosis Date  . Arthritis   . DDD (degenerative disc disease), cervical 01/12/2012    Past Surgical History  Procedure Laterality Date  . Appendectomy    . Coronary angioplasty with stent placement  1995  . Hernia repair      History  Substance Use Topics  . Smoking status: Former Smoker -- 1.00 packs/day    Types: Cigarettes    Quit date: 05/18/1993  . Smokeless tobacco: Never Used  . Alcohol Use: 1.8 oz/week    3 Glasses of wine per week     Comment: SOCIAL    Family History  Problem Relation Age of Onset  . Asthma Mother   . Crohn's disease Mother   . Hepatitis C Brother     No Known Allergies  Medication list has been reviewed and updated.  Current Outpatient Prescriptions on File Prior to Visit  Medication Sig Dispense Refill  . fexofenadine (ALLEGRA) 180 MG tablet Take 180 mg by mouth daily.      . fluticasone (FLONASE) 50 MCG/ACT nasal spray USE 2 SPRAYS IN EACH NOSTRIL EVERY DAY  16 g  5  .  omeprazole (PRILOSEC) 20 MG capsule TAKE 1 CAPSULE (20 MG TOTAL) BY MOUTH DAILY.  30 capsule  5  . sildenafil (VIAGRA) 100 MG tablet Take 1 tablet (100 mg total) by mouth daily as needed for erectile dysfunction.  10 tablet  5  . timolol (BETIMOL) 0.25 % ophthalmic solution 1-2 drops 2 (two) times daily.      Marland Kitchen triamcinolone (KENALOG) 0.025 % cream Apply topically 2 (two) times daily.  454 g  2  . aspirin 81 MG tablet Take 81 mg by mouth daily.      . Lidocaine, Anorectal, 5 % GEL Apply 1 application topically 3 (three) times daily.  30 g  0   Current Facility-Administered Medications on File Prior to Visit  Medication Dose Route Frequency Provider Last Rate Last Dose  . acetaminophen (TYLENOL) tablet 975 mg  975 mg Oral Once Anders Simmonds, PA-C        Review of Systems:  As per HPI, otherwise negative.    Physical Examination: Filed Vitals:   01/26/13 1717  BP: 144/80  Pulse: 70  Temp: 99.1 F (37.3 C)  Resp: 18   Filed Vitals:   01/26/13 1717  Height: 6\' 3"  (1.905 m)  Weight: 233 lb 6.4 oz (105.87 kg)   Body mass  index is 29.17 kg/(m^2). Ideal Body Weight: Weight in (lb) to have BMI = 25: 199.6  GEN: WDWN, NAD, Non-toxic, A & O x 3 HEENT: Atraumatic, Normocephalic. Neck supple. No masses, No LAD. Ears and Nose: No external deformity. CV: RRR, No M/G/R. No JVD. No thrill. No extra heart sounds. PULM: CTA B, no wheezes, crackles, rhonchi. No retractions. No resp. distress. No accessory muscle use. ABD: S, NT, ND, +BS. No rebound. No HSM. EXTR: No c/c/e NEURO Normal gait.  PSYCH: Normally interactive. Conversant. Not depressed or anxious appearing.  Calm demeanor.    Assessment and Plan: Elevated blood pressure   Signed,  Phillips Odor, MD

## 2013-02-10 ENCOUNTER — Encounter: Payer: Self-pay | Admitting: *Deleted

## 2013-02-15 ENCOUNTER — Ambulatory Visit (INDEPENDENT_AMBULATORY_CARE_PROVIDER_SITE_OTHER): Payer: No Typology Code available for payment source | Admitting: Internal Medicine

## 2013-02-15 ENCOUNTER — Telehealth: Payer: Self-pay | Admitting: Internal Medicine

## 2013-02-15 ENCOUNTER — Encounter: Payer: Self-pay | Admitting: Internal Medicine

## 2013-02-15 VITALS — BP 140/88 | HR 57 | Ht 75.0 in | Wt 236.1 lb

## 2013-02-15 DIAGNOSIS — Z79899 Other long term (current) drug therapy: Secondary | ICD-10-CM

## 2013-02-15 DIAGNOSIS — I2510 Atherosclerotic heart disease of native coronary artery without angina pectoris: Secondary | ICD-10-CM

## 2013-02-15 DIAGNOSIS — I10 Essential (primary) hypertension: Secondary | ICD-10-CM

## 2013-02-15 LAB — BASIC METABOLIC PANEL
CO2: 21 mEq/L (ref 19–32)
Calcium: 9.7 mg/dL (ref 8.4–10.5)
Creat: 0.97 mg/dL (ref 0.50–1.35)
Glucose, Bld: 96 mg/dL (ref 70–99)

## 2013-02-15 MED ORDER — IRBESARTAN 150 MG PO TABS: 150.0000 mg | ORAL_TABLET | Freq: Every day | ORAL | Status: DC

## 2013-02-15 NOTE — Progress Notes (Addendum)
OFFICE NOTE  Chief Complaint:  No complaints  Primary Care Physician: Tally Due, MD  HPI:  Adam Peters is a 62 year old gentleman who I have been following for history of coronary disease status post PCI of the LAD in 1997 (with a Palmaz-Schatz stent). He has history of smoking in the past but has discontinued that. Echo in 2010 showed a low normal EF and he had a stress test which was negative. Recently, this past fall he was contemplating treatment for hepatitis C and underwent a stress test which was negative for ischemia. At that time, EF was 51%. He, unfortunately, did not undergo treatment at that time; however, has since been set up to see Dr. Jacqualine Mau at [facility] by you who was contemplating treatment starting in December. He is also contemplating retirement at the end of January of next year. He continues to be active and is exercising without limitations. A review of his medications indicate he is only taking aspirin for his coronary disease. He is no longer on a statin due to his liver problems and did not feel that any other medication was necessary. Of note, recently has noted that his blood pressure is running slightly higher and in the office today his blood pressure was elevated. I did go ahead and look back and other blood pressures which were notably either high normal or slightly elevated.  PMHx:  Past Medical History  Diagnosis Date  . Arthritis   . DDD (degenerative disc disease), cervical 01/12/2012  . Coronary artery disease     s/p PCI to LAD (1997) - Dr. Mervyn Skeeters. [place]  . History of tobacco use   . Hepatitis C   . History of nuclear stress test 04/15/2011    bruce protocol myoview; negative for ischemia, low risk     Past Surgical History  Procedure Laterality Date  . Appendectomy  1965  . Coronary angioplasty with stent placement  06/09/1995    prox LAD stenting 80% to 0% (Dr. Langston Reusing) - repeat cath in 11/1999 showed no significant coronary disease &  normal systolic function (Dr. Laurell Josephs)  . Hernia repair  1994  . Knife wound repair  1969  . Transthoracic echocardiogram  11/13/2008    EF 50-55%, normal LV systolic function; RA mildly dilated; trace MR/TR/PR    FAMHx:  Family History  Problem Relation Age of Onset  . Asthma Mother   . Crohn's disease Mother   . Hepatitis C Brother   . Dementia Father     SOCHx:   reports that he quit smoking about 19 years ago. His smoking use included Cigarettes. He smoked 1.00 pack per day. He has never used smokeless tobacco. He reports that he drinks about 1.8 ounces of alcohol per week. He reports that he does not use illicit drugs.  ALLERGIES:  Allergies  Allergen Reactions  . Amoxicillin Diarrhea    GI upset.    ROS: A comprehensive review of systems was negative.  HOME MEDS: Current Outpatient Prescriptions  Medication Sig Dispense Refill  . aspirin 81 MG tablet Take 81 mg by mouth daily as needed.       . fluticasone (FLONASE) 50 MCG/ACT nasal spray USE 2 SPRAYS IN EACH NOSTRIL EVERY DAY  16 g  5  . omeprazole (PRILOSEC) 20 MG capsule TAKE 1 CAPSULE (20 MG TOTAL) BY MOUTH DAILY.  30 capsule  5  . sildenafil (VIAGRA) 100 MG tablet Take 1 tablet (100 mg total) by mouth daily  as needed for erectile dysfunction.  10 tablet  5  . timolol (BETIMOL) 0.5 % ophthalmic solution Place 1 drop into both eyes daily.      Marland Kitchen triamcinolone (KENALOG) 0.025 % cream Apply topically 2 (two) times daily.  454 g  2  . triamcinolone (KENALOG) 0.025 % ointment Apply 1 application topically as needed.      . irbesartan (AVAPRO) 150 MG tablet Take 1 tablet (150 mg total) by mouth at bedtime.  30 tablet  11   Current Facility-Administered Medications  Medication Dose Route Frequency Provider Last Rate Last Dose  . acetaminophen (TYLENOL) tablet 975 mg  975 mg Oral Once Anders Simmonds, PA-C        LABS/IMAGING: Results for orders placed in visit on 02/15/13 (from the past 48 hour(s))  BASIC  METABOLIC PANEL     Status: None   Collection Time    02/15/13  9:13 AM      Result Value Range   Sodium 139  135 - 145 mEq/L   Potassium 3.7  3.5 - 5.3 mEq/L   Chloride 110  96 - 112 mEq/L   CO2 21  19 - 32 mEq/L   Glucose, Bld 96  70 - 99 mg/dL   BUN 12  6 - 23 mg/dL   Creat 1.61  0.96 - 0.45 mg/dL   Calcium 9.7  8.4 - 40.9 mg/dL   No results found.  VITALS: BP 140/88  Pulse 57  Ht 6\' 3"  (1.905 m)  Wt 236 lb 1.6 oz (107.094 kg)  BMI 29.51 kg/m2  EXAM: General appearance: alert and no distress Neck: no adenopathy, no carotid bruit, no JVD, supple, symmetrical, trachea midline and thyroid not enlarged, symmetric, no tenderness/mass/nodules Lungs: clear to auscultation bilaterally Heart: regular rate and rhythm, S1, S2 normal, no murmur, click, rub or gallop Abdomen: soft, non-tender; bowel sounds normal; no masses,  no organomegaly Extremities: extremities normal, atraumatic, no cyanosis or edema Pulses: 2+ and symmetric Skin: Skin color, texture, turgor normal. No rashes or lesions Neurologic: Grossly normal Psych: Mood, affect normal  EKG: Sinus bradycardia at 57 with PACs  ASSESSMENT: 1. Coronary artery disease status post PCI in 1997 2. Hepatitis C-untreated 3. Dyslipidemia-not on a statin due to hepatitis  PLAN: 1.   Mr. Sliwa appears to have at least borderline hypertension or possibly mild hypertension. He also is on very little treatment for coronary disease. He's done well after his stent in 97, but aspirin alone is not good enough therapy to reduce his risk of recurrent heart attack her LV remodeling. I would recommend starting low-dose ARB which should be tolerable and a recheck of his metabolic profile. I offered to check his lipid profile as well but he wants to defer this to his visit with Dr. Perrin Maltese in a few weeks. However recommend he has a lipid NMR test - to evaluate particle number. Hopefully he will continue on to get treatment for his hepatitis C,  but he is continuing to hold out for promising new medications.  Plan to have him back in 2 weeks for blood pressure check.  [name], MD, [facility] Attending Cardiologist [facility]  Aki Abalos C 02/15/2013, 4:30 PM

## 2013-02-15 NOTE — Telephone Encounter (Signed)
Please call-concerning his blood work,

## 2013-02-15 NOTE — Patient Instructions (Addendum)
Your physician recommends that you return for lab work in about 1 week.  Start taking irbesartan 150mg  daily for blood pressure.   Please return for a blood pressure check 2-3 weeks with Baxter Hire (pharmacist) Please bring a list of blood pressure readings.   Your physician wants you to follow-up in: 1 year with Dr. Rennis Golden. You will receive a reminder letter in the mail two months in advance. If you don't receive a letter, please call our office to schedule the follow-up appointment.  Omron blood pressure cuff

## 2013-02-15 NOTE — Telephone Encounter (Signed)
Returned patient's call. Patient stated that he had blood drawn today and then saw instructions to have blood drawn in 1 week after starting medication. New lab order placed and lab slip mailed to patient.

## 2013-02-23 ENCOUNTER — Encounter: Payer: Self-pay | Admitting: Internal Medicine

## 2013-02-23 LAB — BASIC METABOLIC PANEL
Calcium: 9.8 mg/dL (ref 8.4–10.5)
Creat: 0.87 mg/dL (ref 0.50–1.35)
Sodium: 137 mEq/L (ref 135–145)

## 2013-03-02 ENCOUNTER — Ambulatory Visit (INDEPENDENT_AMBULATORY_CARE_PROVIDER_SITE_OTHER)
Payer: No Typology Code available for payment source | Admitting: Pharmacist Clinician (PhC)/ Clinical Pharmacy Specialist

## 2013-03-02 ENCOUNTER — Encounter: Payer: Self-pay | Admitting: Pharmacist Clinician (PhC)/ Clinical Pharmacy Specialist

## 2013-03-02 VITALS — BP 140/82 | HR 80

## 2013-03-02 DIAGNOSIS — I10 Essential (primary) hypertension: Secondary | ICD-10-CM

## 2013-03-02 NOTE — Patient Instructions (Signed)
Your blood pressure today is good at 140/82. Check your blood pressure at home 4-5 times per week and keep record of the readings.  Take your BP meds as follows:   PM: irbesartan 150mg    Bring all of your meds, your BP cuff and your record of home blood pressures to your next appointment.  Exercise as you're able, try to walk approximately 30 minutes per day.  Keep salt intake to a minimum, especially watch canned and prepared boxed foods.  Eat more fresh fruits and vegetables and fewer canned items.  Avoid eating in fast food restaurants.     Return for follow up blood pressure check in 6-8 weeks  HOW TO TAKE YOUR BLOOD PRESSURE:   Rest 5 minutes before taking your blood pressure.    Don't smoke or drink caffeinated beverages for at least 30 minutes before.   Take your blood pressure before (not after) you eat.   Sit comfortably with your back supported and both feet on the floor (don't cross your legs).   Elevate your arm to heart level on a table or a desk.   Use the proper sized cuff. It should fit smoothly and snugly around your bare upper arm. There should be enough room to slip a fingertip under the cuff. The bottom edge of the cuff should be 1 inch above the crease of the elbow.   Ideally, take 3 measurements at one sitting and record the average.

## 2013-03-02 NOTE — Progress Notes (Signed)
03/02/2013 [name] Sep 05, 1950 [id number]   HPI:  Adam Peters is a 62 y.o. male with a PMH below who presents today for a follow up blood pressure check.  He saw Dr. Rennis Golden about 2 weeks ago and was started on irbesartan 150mg  qd.  A BMET showed no change in serum creatinine, BUN or potassium levels.  He is not terribly excited to be taking a medication for his blood pressure and would like to know what he can do as far as sodium restrictions and exercise.  He checks his pressure at home and has been seeing systolic 130-160s and diastolics 90-100.  [facility] and is trying to get himself back on track with daily workouts, hopefull with the bikes as the treadmill bothers his knee.  He also states he bought some [employer] yesterday and is hoping to replace the salt.  His diet is fairly good, although he will often get a meal at the cafeteria and take it back home.  He prefers to get a salad with some chicken or fish on the side.  He does admit that the meats are usually fried.     Current Outpatient Prescriptions  Medication Sig Dispense Refill  . aspirin 81 MG tablet Take 81 mg by mouth daily as needed.       . fluticasone (FLONASE) 50 MCG/ACT nasal spray USE 2 SPRAYS IN EACH NOSTRIL EVERY DAY  16 g  5  . irbesartan (AVAPRO) 150 MG tablet Take 1 tablet (150 mg total) by mouth at bedtime.  30 tablet  11  . omeprazole (PRILOSEC) 20 MG capsule TAKE 1 CAPSULE (20 MG TOTAL) BY MOUTH DAILY.  30 capsule  5  . sildenafil (VIAGRA) 100 MG tablet Take 1 tablet (100 mg total) by mouth daily as needed for erectile dysfunction.  10 tablet  5  . timolol (BETIMOL) 0.5 % ophthalmic solution Place 1 drop into both eyes daily.      Marland Kitchen triamcinolone (KENALOG) 0.025 % cream Apply topically 2 (two) times daily.  454 g  2  . triamcinolone (KENALOG) 0.025 % ointment Apply 1 application topically as needed.       Current Facility-Administered Medications  Medication  Dose Route Frequency Provider Last Rate Last Dose  . acetaminophen (TYLENOL) tablet 975 mg  975 mg Oral Once Anders Simmonds, PA-C        Allergies  Allergen Reactions  . Amoxicillin Diarrhea    GI upset.    Past Medical History  Diagnosis Date  . Arthritis   . DDD (degenerative disc disease), cervical 01/12/2012  . Coronary artery disease     s/p PCI to LAD (1997) - Dr. Mervyn Skeeters. [facility]  . History of tobacco use   . Hepatitis C   . History of nuclear stress test 04/15/2011    bruce protocol myoview; negative for ischemia, low risk     Blood pressure 140/82, pulse 80.   ASSESSMENT AND PLAN:  His BP today is within acceptable limits and I assured him that we would not add to his medication regimen today.  His blood sugar on his last BMET was 114, although he is not a diagnosed diabetic.  I will continue to watch glucose levels on his labs, but at this point will not treat him to diabetic goals.  He is very eager to adjust his diet and we mostly talked about ways to minimize sodium intake.  He needs to avoid the fried meats and eat more grilled/baked meats instead.  We also talked about avoiding canned and boxed foods, and to watch food labels for things that might be high in sodium.  I explained that with changes to sodium intake and exercise we can hope to drop his BP by 5-15 points, which is enough to keep him from needing more medications.  I also explained that for most hypertensive patients 2-3 meds is the average for keeping it controlled and that he may need to eventually take a second med, regardless of how well he keeps up with the diet and exercise.  I have asked him to work on these things and I will see him back in 6-8 weeks for a follow up appointment.  Phillips Hay PharmD CPP  [facility]

## 2013-03-22 ENCOUNTER — Ambulatory Visit (INDEPENDENT_AMBULATORY_CARE_PROVIDER_SITE_OTHER): Payer: No Typology Code available for payment source | Admitting: Internal Medicine

## 2013-03-22 ENCOUNTER — Ambulatory Visit: Payer: No Typology Code available for payment source

## 2013-03-22 VITALS — BP 122/68 | HR 68 | Temp 98.0°F | Resp 16 | Ht 75.0 in | Wt 236.0 lb

## 2013-03-22 DIAGNOSIS — J209 Acute bronchitis, unspecified: Secondary | ICD-10-CM

## 2013-03-22 DIAGNOSIS — R05 Cough: Secondary | ICD-10-CM

## 2013-03-22 LAB — POCT CBC
Granulocyte percent: 70.1 %G (ref 37–80)
HCT, POC: 43.8 % (ref 43.5–53.7)
Hemoglobin: 13.7 g/dL — AB (ref 14.1–18.1)
Lymph, poc: 1.2 (ref 0.6–3.4)
MCHC: 31.3 g/dL — AB (ref 31.8–35.4)
MCV: 99.2 fL — AB (ref 80–97)
MID (cbc): 0.3 (ref 0–0.9)
POC Granulocyte: 3.5 (ref 2–6.9)
POC LYMPH PERCENT: 23.4 %L (ref 10–50)
Platelet Count, POC: 212 10*3/uL (ref 142–424)
RDW, POC: 13.2 %

## 2013-03-22 MED ORDER — HYDROCODONE-ACETAMINOPHEN 7.5-325 MG/15ML PO SOLN: 5.0000 mL | Freq: Four times a day (QID) | ORAL | Status: DC | PRN

## 2013-03-22 MED ORDER — AZITHROMYCIN 500 MG PO TABS: 500.0000 mg | ORAL_TABLET | Freq: Every day | ORAL | Status: DC

## 2013-03-22 NOTE — Progress Notes (Signed)
  Subjective:    Patient ID: Adam Peters, male    DOB: 05/06/1951, 62 y.o.   MRN: [id number]  HPI Patient presents today with a persistent cough. It has been going on for 3 weeks. He went on a cruise and the cough had started before the cruise. He did get better while on the cruise ship. He has a history of allergies and he thought it was just allergies at first. The cough has gotten worse since he has been back in [place]. He has tried OTC Cough DM. He has not had a fever, no SOB., no cp but has yellow sputum. Partner is sick with same.  Hep C restarting tx soon.  Review of Systems Hep C, CAD    Objective:   Physical Exam  Vitals reviewed. Constitutional: He is oriented to person, place, and time. He appears well-developed and well-nourished. No distress.  Cardiovascular: Normal rate, regular rhythm and normal heart sounds.   Pulmonary/Chest: Effort normal. No respiratory distress. He has wheezes. He has rhonchi. He has no rales. He exhibits no tenderness.  Musculoskeletal: Normal range of motion.  Neurological: He is alert and oriented to person, place, and time. No cranial nerve deficit. He exhibits normal muscle tone. Coordination normal.  Skin: No rash noted.  Psychiatric: He has a normal mood and affect.    [facility] reading (PRIMARY) by  Dr Perrin Maltese normal chest Results for orders placed in visit on 03/22/13  POCT CBC      Result Value Range   WBC 5.0  4.6 - 10.2 K/uL   Lymph, poc 1.2  0.6 - 3.4   POC LYMPH PERCENT 23.4  10 - 50 %L   MID (cbc) 0.3  0 - 0.9   POC MID % 6.5  0 - 12 %M   POC Granulocyte 3.5  2 - 6.9   Granulocyte percent 70.1  37 - 80 %G   RBC 4.42 (*) 4.69 - 6.13 M/uL   Hemoglobin 13.7 (*) 14.1 - 18.1 g/dL   HCT, POC 65.7  84.6 - 53.7 %   MCV 99.2 (*) 80 - 97 fL   MCH, POC 31.0  27 - 31.2 pg   MCHC 31.3 (*) 31.8 - 35.4 g/dL   RDW, POC 96.2     Platelet Count, POC 212  142 - 424 K/uL   MPV 8.6  0 - 99.8 fL           Assessment & Plan:   Bronchitis Zpak/Lortab elixir

## 2013-03-22 NOTE — Patient Instructions (Signed)

## 2013-03-23 ENCOUNTER — Other Ambulatory Visit: Payer: Self-pay

## 2013-03-26 ENCOUNTER — Encounter: Payer: Self-pay | Admitting: Internal Medicine

## 2013-04-05 NOTE — Telephone Encounter (Signed)
Error

## 2013-04-06 ENCOUNTER — Encounter: Payer: Self-pay | Admitting: Internal Medicine

## 2013-04-06 NOTE — Telephone Encounter (Signed)
Reply sent

## 2013-04-27 ENCOUNTER — Ambulatory Visit (INDEPENDENT_AMBULATORY_CARE_PROVIDER_SITE_OTHER): Payer: No Typology Code available for payment source | Admitting: Family Medicine

## 2013-04-27 ENCOUNTER — Encounter
Payer: No Typology Code available for payment source | Admitting: Pharmacist Clinician (PhC)/ Clinical Pharmacy Specialist

## 2013-04-27 ENCOUNTER — Ambulatory Visit (INDEPENDENT_AMBULATORY_CARE_PROVIDER_SITE_OTHER)
Payer: No Typology Code available for payment source | Admitting: Pharmacist Clinician (PhC)/ Clinical Pharmacy Specialist

## 2013-04-27 ENCOUNTER — Encounter: Payer: Self-pay | Admitting: Pharmacist Clinician (PhC)/ Clinical Pharmacy Specialist

## 2013-04-27 VITALS — BP 160/92 | HR 101 | Temp 98.1°F | Resp 18 | Wt 242.0 lb

## 2013-04-27 VITALS — BP 142/72 | HR 60 | Ht 76.0 in | Wt 243.4 lb

## 2013-04-27 DIAGNOSIS — I10 Essential (primary) hypertension: Secondary | ICD-10-CM

## 2013-04-27 DIAGNOSIS — J45909 Unspecified asthma, uncomplicated: Secondary | ICD-10-CM

## 2013-04-27 DIAGNOSIS — J329 Chronic sinusitis, unspecified: Secondary | ICD-10-CM

## 2013-04-27 DIAGNOSIS — J4520 Mild intermittent asthma, uncomplicated: Secondary | ICD-10-CM

## 2013-04-27 MED ORDER — CEFDINIR 300 MG PO CAPS: 600.0000 mg | ORAL_CAPSULE | Freq: Every day | ORAL | Status: DC

## 2013-04-27 MED ORDER — IRBESARTAN 300 MG PO TABS: 300.0000 mg | ORAL_TABLET | Freq: Every day | ORAL | Status: DC

## 2013-04-27 MED ORDER — ALBUTEROL SULFATE HFA 108 (90 BASE) MCG/ACT IN AERS: 2.0000 | INHALATION_SPRAY | RESPIRATORY_TRACT | Status: DC | PRN

## 2013-04-27 NOTE — Patient Instructions (Signed)
Your blood pressure today is slightly elevated at 142/72  Check your blood pressure at home daily (if able) and keep record of the readings.  Take your BP meds as follows:   PM: increase irbesartan to 300mg  each evening   Bring all of your meds, your BP cuff and your record of home blood pressures to your next appointment.  Exercise as you're able, try to walk approximately 30 minutes per day.  Keep salt intake to a minimum, especially watch canned and prepared boxed foods.  Eat more fresh fruits and vegetables and fewer canned items.  Avoid eating in fast food restaurants.    HOW TO TAKE YOUR BLOOD PRESSURE:   Rest 5 minutes before taking your blood pressure.    Don't smoke or drink caffeinated beverages for at least 30 minutes before.   Take your blood pressure before (not after) you eat.   Sit comfortably with your back supported and both feet on the floor (don't cross your legs).   Elevate your arm to heart level on a table or a desk.   Use the proper sized cuff. It should fit smoothly and snugly around your bare upper arm. There should be enough room to slip a fingertip under the cuff. The bottom edge of the cuff should be 1 inch above the crease of the elbow.   Ideally, take 3 measurements at one sitting and record the average.

## 2013-04-27 NOTE — Progress Notes (Signed)
Subjective: 62 year old man who has been having problems with a cough for a couple of weeks. About 3 weeks ago he had a flu shot and Pneumovax. He does not smoke. He has coughed up some phlegm, usually before bedtime. He doesn't feel real sick, but doesn't feel well. He recently was on a cruise.  Objective:  TMs partially occluded by cerumen bilaterally but look okay. Throat clear. He does have a little postnasal drainage. Neck supple without significant nodes. Chest clear to auscultation. Heart regular without any murmurs.  Assessment: Asthmatic bronchitis Sinusitis  Plan: Omnicef (discussed the amoxicillin which caused diarrhea.) Albuterol  Return if worse

## 2013-04-27 NOTE — Progress Notes (Signed)
04/27/2013 Adam Peters 30-Aug-1950 [id number]   HPI:  Adam Peters is a 62 y.o. male with a PMH below who presents today for a follow up blood pressure check. He was last seen for a blood pressure check on October 16 with a BP of 140/82.  As the patient is not diabetic this was WNL.  Since that time he has continued to check his BP at home, where a majority of the readins are between 150-170/90-103.  He has recently increased his exercise, to include weight training as well as swimming at the [facility].  He has made an effort to decrease sodium in his diet, eating more salads and fruits and cutting out fried foods almost completely.  He has been compliant with his irbesartan 150mg  once daily, and his last BMET in October was normal except for a glucose of 114 (unknown if pt was fasting).   He quit smoking in 1995.    Current Outpatient Prescriptions  Medication Sig Dispense Refill  . aspirin 81 MG tablet Take 81 mg by mouth daily as needed.       . fluticasone (FLONASE) 50 MCG/ACT nasal spray USE 2 SPRAYS IN EACH NOSTRIL EVERY DAY  16 g  5  . irbesartan (AVAPRO) 300 MG tablet Take 1 tablet (300 mg total) by mouth daily.  30 tablet  3  . omeprazole (PRILOSEC) 20 MG capsule TAKE 1 CAPSULE (20 MG TOTAL) BY MOUTH DAILY.  30 capsule  5  . sildenafil (VIAGRA) 100 MG tablet Take 1 tablet (100 mg total) by mouth daily as needed for erectile dysfunction.  10 tablet  5  . timolol (BETIMOL) 0.5 % ophthalmic solution Place 1 drop into both eyes daily.      Marland Kitchen triamcinolone (KENALOG) 0.025 % cream Apply topically 2 (two) times daily.  454 g  2  . triamcinolone (KENALOG) 0.025 % ointment Apply 1 application topically as needed.       Current Facility-Administered Medications  Medication Dose Route Frequency Provider Last Rate Last Dose  . acetaminophen (TYLENOL) tablet 975 mg  975 mg Oral Once Anders Simmonds, PA-C        Allergies  Allergen Reactions  . Amoxicillin Diarrhea    GI  upset.    Past Medical History  Diagnosis Date  . Arthritis   . DDD (degenerative disc disease), cervical 01/12/2012  . Coronary artery disease     s/p PCI to LAD (1997) - Dr. Mervyn Skeeters. [place]  . History of tobacco use   . Hepatitis C   . History of nuclear stress test 04/15/2011    bruce protocol myoview; negative for ischemia, low risk     Blood pressure 142/72, pulse 60, height 6\' 4"  (1.93 m), weight 243 lb 6.4 oz (110.406 kg).   ASSESSMENT AND PLAN:  His blood pressure is essentially unchanged from his last visit on October 16.  He did bring his BP cuff into the office for calibration today and it was found to be accurate within 5 points.  While his in office reading today was within JNC-8 guidelines, I would still prefer to keep his goals to <140/80 as he is still quite healthy and active.  With his home readings being significantly higher than in office readings, I am going to increase his irbesartan to 300mg  once daily.  I have asked him to continue with low sodium choices and the healthy diet he is working on, as well as encouraged him to continue  his routines at the [facility].  I will see him again in 4-6 weeks to determine if the increase in irbesartan has sufficiently decreased his home BP readings.     Phillips Hay PharmD CPP [facility]

## 2013-04-27 NOTE — Patient Instructions (Signed)
Drink plenty of fluids  Get enough rest  Use the albuterol 2 inhalations every 4-6 hours  Take the Omnicef (cefdinir) one twice daily  Return if not improving

## 2013-05-02 ENCOUNTER — Encounter: Payer: Self-pay | Admitting: Pharmacist Clinician (PhC)/ Clinical Pharmacy Specialist

## 2013-06-01 ENCOUNTER — Encounter: Payer: Self-pay | Admitting: Pharmacist Clinician (PhC)/ Clinical Pharmacy Specialist

## 2013-06-01 ENCOUNTER — Ambulatory Visit (INDEPENDENT_AMBULATORY_CARE_PROVIDER_SITE_OTHER)
Payer: No Typology Code available for payment source | Admitting: Pharmacist Clinician (PhC)/ Clinical Pharmacy Specialist

## 2013-06-01 VITALS — BP 150/88 | HR 76 | Ht 75.0 in | Wt 241.2 lb

## 2013-06-01 DIAGNOSIS — I10 Essential (primary) hypertension: Secondary | ICD-10-CM

## 2013-06-01 MED ORDER — AMLODIPINE BESYLATE 5 MG PO TABS: 5.0000 mg | ORAL_TABLET | Freq: Every day | ORAL | Status: DC

## 2013-06-01 NOTE — Patient Instructions (Addendum)
Return for a follow up blood pressure check in 4-6 weeks   Your blood pressure today is 150/88  Check your blood pressure at home daily (if able) and keep record of the readings.  Take your BP meds as follows:  AM:  Amlodipine 5mg    PM: Irbesartan 300mg    Bring all of your meds, your BP cuff and your record of home blood pressures to your next appointment.  Exercise as you're able, try to walk approximately 30 minutes per day.  Keep salt intake to a minimum, especially watch canned and prepared boxed foods.  Eat more fresh fruits and vegetables and fewer canned items.  Avoid eating in fast food restaurants.    HOW TO TAKE YOUR BLOOD PRESSURE:   Rest 5 minutes before taking your blood pressure.    Don't smoke or drink caffeinated beverages for at least 30 minutes before.   Take your blood pressure before (not after) you eat.   Sit comfortably with your back supported and both feet on the floor (don't cross your legs).   Elevate your arm to heart level on a table or a desk.   Use the proper sized cuff. It should fit smoothly and snugly around your bare upper arm. There should be enough room to slip a fingertip under the cuff. The bottom edge of the cuff should be 1 inch above the crease of the elbow.   Ideally, take 3 measurements at one sitting and record the average.

## 2013-06-01 NOTE — Progress Notes (Signed)
06/01/2013 KAZIMIR HARTNETT 10-08-1950 [id number]   HPI:  Adam Peters is a 63 y.o. male patient of Dr Debara Pickett, with a PMH below who presents today for a blood pressure check.  I have seen him on several occasions, and with each change we have seen only small improvements in his blood pressure.  Today he states that he has greatly changed his eating habits, in that now he eats most meals at home, making salads and oatmeal, and eating more fruit.  He continues to swim at the [facility], 5 times per week.  He has not checked his home BP readings for the past month, as he assumed that he was doing well with the increase in medications.  He currently takes irbesartan 300mg  once daily, although he admits that the time of day can vary by 3-4 hours, depending on when he goes to bed.     Current Outpatient Prescriptions  Medication Sig Dispense Refill  . albuterol (PROVENTIL HFA;VENTOLIN HFA) 108 (90 BASE) MCG/ACT inhaler Inhale 2 puffs into the lungs every 4 (four) hours as needed for wheezing or shortness of breath (cough, shortness of breath or wheezing.).  1 Inhaler  1  . aspirin 81 MG tablet Take 81 mg by mouth daily as needed.       . cefdinir (OMNICEF) 300 MG capsule Take 2 capsules (600 mg total) by mouth daily.  20 capsule  0  . dorzolamide-timolol (COSOPT) 22.3-6.8 MG/ML ophthalmic solution Place 1 drop into both eyes 2 (two) times daily.      . fluticasone (FLONASE) 50 MCG/ACT nasal spray USE 2 SPRAYS IN EACH NOSTRIL EVERY DAY  16 g  5  . irbesartan (AVAPRO) 300 MG tablet Take 1 tablet (300 mg total) by mouth daily.  30 tablet  3  . sildenafil (VIAGRA) 100 MG tablet Take 1 tablet (100 mg total) by mouth daily as needed for erectile dysfunction.  10 tablet  5  . timolol (BETIMOL) 0.5 % ophthalmic solution Place 1 drop into both eyes daily.      Marland Kitchen triamcinolone (KENALOG) 0.025 % cream Apply topically 2 (two) times daily.  454 g  2  . triamcinolone (KENALOG) 0.025 % ointment Apply 1  application topically as needed.       Current Facility-Administered Medications  Medication Dose Route Frequency Provider Last Rate Last Dose  . acetaminophen (TYLENOL) tablet 975 mg  975 mg Oral Once Argentina Donovan, PA-C        Allergies  Allergen Reactions  . Amoxicillin Diarrhea    GI upset.    Past Medical History  Diagnosis Date  . Arthritis   . DDD (degenerative disc disease), cervical 01/12/2012  . Coronary artery disease     s/p PCI to LAD (1997) - Dr. Loni Muse. [facility]  . History of tobacco use   . Hepatitis C   . History of nuclear stress test 04/15/2011    bruce protocol myoview; negative for ischemia, low risk     Blood pressure 150/88, pulse 76, height 6\' 3"  (1.905 m), weight 241 lb 3.2 oz (109.408 kg).   ASSESSMENT AND PLAN:  Today his pressure is acceptable by the newest JNC guidelines, however as he is still a young and active 41, I would prefer to see his BP below the 140/90 goals.  Therefore I will add amlodipine 5mg  once daily and have asked that he take it in the mornings, apart from his irbesartan dose.   He is concerned about  compliance, as he tends to be a night owl, going to bed sometime between 12-3am and getting up late.  He currently uses eye drops twice daily for glaucoma, so I asked that he take each of the BP meds with doses of eye-drops.  I explained that we would see better 24 hour control of his BP by dividing the doses.  Pt states he will do his best to make this work, as he would like to see his BP lower as well.  I will see him back in 4-6 weeks for a follow up appointment.  Tommy Medal PharmD CPP [facility]

## 2013-06-29 ENCOUNTER — Ambulatory Visit (INDEPENDENT_AMBULATORY_CARE_PROVIDER_SITE_OTHER): Payer: No Typology Code available for payment source | Admitting: Emergency Medicine

## 2013-06-29 ENCOUNTER — Encounter: Payer: Self-pay | Admitting: Internal Medicine

## 2013-06-29 VITALS — BP 148/80 | HR 72 | Temp 98.8°F | Resp 16 | Ht 75.0 in | Wt 238.0 lb

## 2013-06-29 DIAGNOSIS — J329 Chronic sinusitis, unspecified: Secondary | ICD-10-CM

## 2013-06-29 DIAGNOSIS — J40 Bronchitis, not specified as acute or chronic: Secondary | ICD-10-CM

## 2013-06-29 MED ORDER — HYDROCOD POLST-CHLORPHEN POLST 10-8 MG/5ML PO LQCR: 5.0000 mL | Freq: Two times a day (BID) | ORAL | Status: DC | PRN

## 2013-06-29 MED ORDER — MUCINEX DM MAXIMUM STRENGTH 60-1200 MG PO TB12: 1.0000 | ORAL_TABLET | Freq: Two times a day (BID) | ORAL | Status: DC

## 2013-06-29 MED ORDER — AMOXICILLIN-POT CLAVULANATE 875-125 MG PO TABS: 1.0000 | ORAL_TABLET | Freq: Two times a day (BID) | ORAL | Status: DC

## 2013-06-29 NOTE — Patient Instructions (Signed)
Bronchospasm, Adult A bronchospasm is a spasm or tightening of the airways going into the lungs. During a bronchospasm breathing becomes more difficult because the airways get smaller. When this happens there can be coughing, a whistling sound when breathing (wheezing), and difficulty breathing. Bronchospasm is often associated with asthma, but not all patients who experience a bronchospasm have asthma. CAUSES  A bronchospasm is caused by inflammation or irritation of the airways. The inflammation or irritation may be triggered by:   Allergies (such as to animals, pollen, food, or mold). Allergens that cause bronchospasm may cause wheezing immediately after exposure or many hours later.   Infection. Viral infections are believed to be the most common cause of bronchospasm.   Exercise.   Irritants (such as pollution, cigarette smoke, strong odors, aerosol sprays, and paint fumes).   Weather changes. Winds increase molds and pollens in the air. Rain refreshes the air by washing irritants out. Cold air may cause inflammation.   Stress and emotional upset.  SIGNS AND SYMPTOMS   Wheezing.   Excessive nighttime coughing.   Frequent or severe coughing with a simple cold.   Chest tightness.   Shortness of breath.  DIAGNOSIS  Bronchospasm is usually diagnosed through a history and physical exam. Tests, such as chest X-rays, are sometimes done to look for other conditions. TREATMENT   Inhaled medicines can be given to open up your airways and help you breathe. The medicines can be given using either an inhaler or a nebulizer machine.  Corticosteroid medicines may be given for severe bronchospasm, usually when it is associated with asthma. HOME CARE INSTRUCTIONS   Always have a plan prepared for seeking medical care. Know when to call your health care provider and local emergency services (911 in the [place] Know where you can access local emergency care.  Only take medicines as  directed by your health care provider.  If you were prescribed an inhaler or nebulizer machine, ask your health care provider to explain how to use it correctly. Always use a spacer with your inhaler if you were given one.  It is necessary to remain calm during an attack. Try to relax and breathe more slowly.  Control your home environment in the following ways:   Change your heating and air conditioning filter at least once a month.   Limit your use of fireplaces and wood stoves.  Do not smoke and do not allow smoking in your home.   Avoid exposure to perfumes and fragrances.   Get rid of pests (such as roaches and mice) and their droppings.   Throw away plants if you see mold on them.   Keep your house clean and dust free.   Replace carpet with wood, tile, or vinyl flooring. Carpet can trap dander and dust.   Use allergy-proof pillows, mattress covers, and box spring covers.   Wash bed sheets and blankets every week in hot water and dry them in a dryer.   Use blankets that are made of polyester or cotton.   Wash hands frequently. SEEK MEDICAL CARE IF:   You have muscle aches.   You have chest pain.   The sputum changes from clear or white to yellow, green, gray, or bloody.   The sputum you cough up gets thicker.   There are problems that may be related to the medicine you are given, such as a rash, itching, swelling, or trouble breathing.  SEEK IMMEDIATE MEDICAL CARE IF:   You have worsening wheezing and coughing   even after taking your prescribed medicines.   You have increased difficulty breathing.   You develop severe chest pain. MAKE SURE YOU:   Understand these instructions.  Will watch your condition.  Will get help right away if you are not doing well or get worse. Document Released: 05/07/2003 Document Revised: 01/04/2013 Document Reviewed: 10/24/2012 [facility] Patient Information 2014 [place].

## 2013-06-29 NOTE — Progress Notes (Signed)
[facility] [address] [place] [address] [phone]  Date:  06/29/2013   Name:  Adam Peters   DOB:  08/15/1950   MRN:  [id number]  PCP:  Kennon Portela, MD    Chief Complaint: Illness   History of Present Illness:  Adam Peters is a 63 y.o. very pleasant male patient who presents with the following:  History of asthma and allergies.  Having acute nasal congestion and drainage that is purulent in nature.  Has increased bronchospasm but not using inhaler.  Has a cough productive of purulent sputum.  No fever or chills.  No nausea or vomiting.  No shortness of breath.  No improvement with over the counter medications or other home remedies. Denies other complaint or health concern today.   Patient Active Problem List   Diagnosis Date Noted  . History of tobacco use 07/08/2012  . Glaucoma 04/08/2012  . DDD (degenerative disc disease), cervical   . Hepatitis C 10/27/2011  . CAD (coronary artery disease) 08/17/2011    Past Medical History  Diagnosis Date  . Arthritis   . DDD (degenerative disc disease), cervical 01/12/2012  . Coronary artery disease     s/p PCI to LAD (1997) - Dr. Loni Muse. [place]  . History of tobacco use   . Hepatitis C   . History of nuclear stress test 04/15/2011    bruce protocol myoview; negative for ischemia, low risk     Past Surgical History  Procedure Laterality Date  . Appendectomy  1965  . Coronary angioplasty with stent placement  06/09/1995    prox LAD stenting 80% to 0% (Dr. Rockne Menghini) - repeat cath in 11/1999 showed no significant coronary disease & normal systolic function (Dr. Gerrie Nordmann)  . Hernia repair  1994  . Knife wound repair  1969  . Transthoracic echocardiogram  11/13/2008    EF 50-55%, normal LV systolic function; RA mildly dilated; trace MR/TR/PR    History  Substance Use Topics  . Smoking status: Former Smoker -- 1.00 packs/day    Types: Cigarettes    Quit date: 05/18/1993  .  Smokeless tobacco: Never Used  . Alcohol Use: 1.8 oz/week    3 Glasses of wine per week     Comment: SOCIAL    Family History  Problem Relation Age of Onset  . Asthma Mother   . Crohn's disease Mother   . Hepatitis C Brother   . Dementia Father     Allergies  Allergen Reactions  . Amoxicillin Diarrhea    GI upset.    Medication list has been reviewed and updated.  Current Outpatient Prescriptions on File Prior to Visit  Medication Sig Dispense Refill  . amLODipine (NORVASC) 5 MG tablet Take 1 tablet (5 mg total) by mouth daily.  30 tablet  3  . aspirin 81 MG tablet Take 81 mg by mouth daily as needed.       . dorzolamide-timolol (COSOPT) 22.3-6.8 MG/ML ophthalmic solution Place 1 drop into both eyes 2 (two) times daily.      . fluticasone (FLONASE) 50 MCG/ACT nasal spray USE 2 SPRAYS IN EACH NOSTRIL EVERY DAY  16 g  5  . irbesartan (AVAPRO) 300 MG tablet Take 1 tablet (300 mg total) by mouth daily.  30 tablet  3  . triamcinolone (KENALOG) 0.025 % cream Apply topically 2 (two) times daily.  454 g  2  . triamcinolone (KENALOG) 0.025 % ointment Apply 1 application topically  as needed.      Marland Kitchen albuterol (PROVENTIL HFA;VENTOLIN HFA) 108 (90 BASE) MCG/ACT inhaler Inhale 2 puffs into the lungs every 4 (four) hours as needed for wheezing or shortness of breath (cough, shortness of breath or wheezing.).  1 Inhaler  1  . sildenafil (VIAGRA) 100 MG tablet Take 1 tablet (100 mg total) by mouth daily as needed for erectile dysfunction.  10 tablet  5   Current Facility-Administered Medications on File Prior to Visit  Medication Dose Route Frequency Provider Last Rate Last Dose  . acetaminophen (TYLENOL) tablet 975 mg  975 mg Oral Once Argentina Donovan, PA-C        Review of Systems:  As per HPI, otherwise negative.    Physical Examination: Filed Vitals:   06/29/13 1816  BP: 148/80  Pulse: 72  Temp: 98.8 F (37.1 C)  Resp: 16   Filed Vitals:   06/29/13 1816  Height: 6\' 3"   (1.905 m)  Weight: 238 lb (107.956 kg)   Body mass index is 29.75 kg/(m^2). Ideal Body Weight: Weight in (lb) to have BMI = 25: 199.6  GEN: WDWN, NAD, Non-toxic, A & O x 3 HEENT: Atraumatic, Normocephalic. Neck supple. No masses, No LAD. Ears and Nose: No external deformity.  Purulent nasal drainage CV: RRR, No M/G/R. No JVD. No thrill. No extra heart sounds. PULM: CTA B, no wheezes, crackles, rhonchi. No retractions. No resp. distress. No accessory muscle use. ABD: S, NT, ND, +BS. No rebound. No HSM. EXTR: No c/c/e NEURO Normal gait.  PSYCH: Normally interactive. Conversant. Not depressed or anxious appearing.  Calm demeanor.    Assessment and Plan: Sinusitis Bronchitis with bronchospasm Continue MDI augementin mucinex   Signed,  Ellison Carwin, MD

## 2013-07-06 ENCOUNTER — Encounter
Payer: No Typology Code available for payment source | Admitting: Pharmacist Clinician (PhC)/ Clinical Pharmacy Specialist

## 2013-07-06 ENCOUNTER — Ambulatory Visit (INDEPENDENT_AMBULATORY_CARE_PROVIDER_SITE_OTHER)
Payer: No Typology Code available for payment source | Admitting: Pharmacist Clinician (PhC)/ Clinical Pharmacy Specialist

## 2013-07-06 ENCOUNTER — Encounter: Payer: Self-pay | Admitting: Pharmacist Clinician (PhC)/ Clinical Pharmacy Specialist

## 2013-07-06 VITALS — BP 150/80 | HR 76 | Ht 75.0 in | Wt 240.7 lb

## 2013-07-06 DIAGNOSIS — I10 Essential (primary) hypertension: Secondary | ICD-10-CM

## 2013-07-06 NOTE — Progress Notes (Signed)
07/06/2013 Adam Peters 1950/12/13 [id number]   HPI:  Adam Peters is a 63 y.o. male patient of Dr Debara Pickett, with a PMH below who presents today for a blood pressure check.  I have seen Adam Peters several times now and his BP is always in the 140-150/80s.  At his last visit we added amlodipine 5mg , which I asked him to take at the opposite time of day from his irbesartan.  Pt states compliance with medications today.  He also currently is suffering from a sinus infection and taking augmentin, mucinex and tussionex in addition to his regular medications.  He has not been to the gym for swimming in the past 2 weeks, mostly due to the sinus infection.  He hopes to get back to the pool in the next 7-10 days, once he is sure the infection is gone.  Adam Peters has been checking his BP at home and states the readings are consistently in the 130/80 range, with nothing as high as 093 systolic.  We checked his BP cuff at an appointment several months ago and found it to be accurate within 5 points.  Of note, he does check his BP while standing, I'm not sure why.  His diet has been much improved over the past few months, and he continues to make healthy, low sodium food choices.   Current Outpatient Prescriptions  Medication Sig Dispense Refill  . albuterol (PROVENTIL HFA;VENTOLIN HFA) 108 (90 BASE) MCG/ACT inhaler Inhale 2 puffs into the lungs every 4 (four) hours as needed for wheezing or shortness of breath (cough, shortness of breath or wheezing.).  1 Inhaler  1  . amLODipine (NORVASC) 5 MG tablet Take 1 tablet (5 mg total) by mouth daily.  30 tablet  3  . amoxicillin-clavulanate (AUGMENTIN) 875-125 MG per tablet Take 1 tablet by mouth 2 (two) times daily.  20 tablet  0  . aspirin 81 MG tablet Take 81 mg by mouth daily as needed.       . chlorpheniramine-HYDROcodone (TUSSIONEX PENNKINETIC ER) 10-8 MG/5ML LQCR Take 5 mLs by mouth every 12 (twelve) hours as needed.  60 mL  0  .  Dextromethorphan-Guaifenesin (MUCINEX DM MAXIMUM STRENGTH) 60-1200 MG TB12 Take 1 tablet by mouth every 12 (twelve) hours.  20 each  0  . dorzolamide-timolol (COSOPT) 22.3-6.8 MG/ML ophthalmic solution Place 1 drop into both eyes 2 (two) times daily.      . fluticasone (FLONASE) 50 MCG/ACT nasal spray USE 2 SPRAYS IN EACH NOSTRIL EVERY DAY  16 g  5  . irbesartan (AVAPRO) 300 MG tablet Take 1 tablet (300 mg total) by mouth daily.  30 tablet  3  . sildenafil (VIAGRA) 100 MG tablet Take 1 tablet (100 mg total) by mouth daily as needed for erectile dysfunction.  10 tablet  5  . triamcinolone (KENALOG) 0.025 % cream Apply topically 2 (two) times daily.  454 g  2  . triamcinolone (KENALOG) 0.025 % ointment Apply 1 application topically as needed.       Current Facility-Administered Medications  Medication Dose Route Frequency Provider Last Rate Last Dose  . acetaminophen (TYLENOL) tablet 975 mg  975 mg Oral Once Argentina Donovan, PA-C        Allergies  Allergen Reactions  . Amoxicillin Diarrhea    GI upset.    Past Medical History  Diagnosis Date  . Arthritis   . DDD (degenerative disc disease), cervical 01/12/2012  . Coronary artery disease  s/p PCI to LAD (1997) - Dr. Loni Muse. [facility]  . History of tobacco use   . Hepatitis C   . History of nuclear stress test 04/15/2011    bruce protocol myoview; negative for ischemia, low risk     Blood pressure 150/80, pulse 76, height 6\' 3"  (1.905 m), weight 240 lb 11.2 oz (109.181 kg).  Standing 140/74   ASSESSMENT AND PLAN:  While his BP in the office appears unchanged from the last visit, he is getting much better readings at home.  His blood pressure dropped 10 points from sitting to standing while in the office, so I would guess that his home BP readings in a seated position would probably be closer to 929 systolic.  However at this time I am not going to make any changes in his medications.  He is to continue with his healthy eating and get back  to the pool once his sinus infection clears.  I have asked him to continue with home BP checks several times per week (seated) and to please call if he notices the readings trending toward 244 systolic.  I will see him again on an as needed basis.  Tommy Medal PharmD CPP [facility]

## 2013-07-06 NOTE — Patient Instructions (Addendum)
Your blood pressure today is 150/80 in th office  Check your blood pressure at home several times per week and keep record of the readings.  Take your BP meds as follows: irbesartan 300mg  and amlodipine 5mg   Call if you see your BP rise to >150/90 on a regular basis  Bring all of your meds, your BP cuff and your record of home blood pressures to your next appointment.  Exercise as you're able, try to walk approximately 30 minutes per day.  Keep salt intake to a minimum, especially watch canned and prepared boxed foods.  Eat more fresh fruits and vegetables and fewer canned items.  Avoid eating in fast food restaurants.    HOW TO TAKE YOUR BLOOD PRESSURE:   Rest 5 minutes before taking your blood pressure.    Don't smoke or drink caffeinated beverages for at least 30 minutes before.   Take your blood pressure before (not after) you eat.   Sit comfortably with your back supported and both feet on the floor (don't cross your legs).   Elevate your arm to heart level on a table or a desk.   Use the proper sized cuff. It should fit smoothly and snugly around your bare upper arm. There should be enough room to slip a fingertip under the cuff. The bottom edge of the cuff should be 1 inch above the crease of the elbow.   Ideally, take 3 measurements at one sitting and record the average.

## 2013-07-28 ENCOUNTER — Encounter (INDEPENDENT_AMBULATORY_CARE_PROVIDER_SITE_OTHER): Payer: No Typology Code available for payment source | Admitting: Internal Medicine

## 2013-07-28 NOTE — Progress Notes (Signed)
This encounter was created in error - please disregard.

## 2013-07-31 ENCOUNTER — Ambulatory Visit (INDEPENDENT_AMBULATORY_CARE_PROVIDER_SITE_OTHER): Payer: No Typology Code available for payment source | Admitting: Family Medicine

## 2013-07-31 ENCOUNTER — Ambulatory Visit: Payer: No Typology Code available for payment source

## 2013-07-31 VITALS — BP 126/80 | HR 69 | Temp 97.8°F | Resp 16 | Ht 75.0 in | Wt 239.0 lb

## 2013-07-31 DIAGNOSIS — R0789 Other chest pain: Secondary | ICD-10-CM

## 2013-07-31 DIAGNOSIS — M25569 Pain in unspecified knee: Secondary | ICD-10-CM

## 2013-07-31 DIAGNOSIS — M5430 Sciatica, unspecified side: Secondary | ICD-10-CM

## 2013-07-31 DIAGNOSIS — I25810 Atherosclerosis of coronary artery bypass graft(s) without angina pectoris: Secondary | ICD-10-CM

## 2013-07-31 LAB — POCT CBC
Granulocyte percent: 65.2 % (ref 37–80)
HCT, POC: 44.1 % (ref 43.5–53.7)
Hemoglobin: 13.9 g/dL — AB (ref 14.1–18.1)
Lymph, poc: 1.4 (ref 0.6–3.4)
MCH, POC: 30.7 pg (ref 27–31.2)
MCHC: 31.5 g/dL — AB (ref 31.8–35.4)
MCV: 97.3 fL — AB (ref 80–97)
MID (cbc): 0.3 (ref 0–0.9)
MPV: 8.8 fL (ref 0–99.8)
POC Granulocyte: 3.2 (ref 2–6.9)
POC LYMPH PERCENT: 27.7 %L (ref 10–50)
POC MID %: 7.1 % (ref 0–12)
Platelet Count, POC: 191 10*3/uL (ref 142–424)
RBC: 4.53 M/uL — AB (ref 4.69–6.13)
RDW, POC: 13.1 %
WBC: 4.9 10*3/uL (ref 4.6–10.2)

## 2013-07-31 LAB — COMPREHENSIVE METABOLIC PANEL
ALT: 24 U/L (ref 0–53)
AST: 25 U/L (ref 0–37)
Albumin: 4.1 g/dL (ref 3.5–5.2)
CO2: 24 mEq/L (ref 19–32)
Calcium: 9.6 mg/dL (ref 8.4–10.5)
Glucose, Bld: 80 mg/dL (ref 70–99)
Sodium: 137 mEq/L (ref 135–145)
Total Bilirubin: 0.7 mg/dL (ref 0.2–1.2)
Total Protein: 7.9 g/dL (ref 6.0–8.3)

## 2013-07-31 LAB — COMPREHENSIVE METABOLIC PANEL WITH GFR
Alkaline Phosphatase: 81 U/L (ref 39–117)
BUN: 12 mg/dL (ref 6–23)
Chloride: 107 meq/L (ref 96–112)
Creat: 0.91 mg/dL (ref 0.50–1.35)
Potassium: 4.1 meq/L (ref 3.5–5.3)

## 2013-07-31 MED ORDER — CYCLOBENZAPRINE HCL 10 MG PO TABS: ORAL_TABLET | ORAL | Status: DC

## 2013-07-31 NOTE — Patient Instructions (Signed)
Chest Pain (Nonspecific) °It is often hard to give a specific diagnosis for the cause of chest pain. There is always a chance that your pain could be related to something serious, such as a heart attack or a blood clot in the lungs. You need to follow up with your caregiver for further evaluation. °CAUSES  °· Heartburn. °· Pneumonia or bronchitis. °· Anxiety or stress. °· Inflammation around your heart (pericarditis) or lung (pleuritis or pleurisy). °· A blood clot in the lung. °· A collapsed lung (pneumothorax). It can develop suddenly on its own (spontaneous pneumothorax) or from injury (trauma) to the chest. °· Shingles infection (herpes zoster virus). °The chest wall is composed of bones, muscles, and cartilage. Any of these can be the source of the pain. °· The bones can be bruised by injury. °· The muscles or cartilage can be strained by coughing or overwork. °· The cartilage can be affected by inflammation and become sore (costochondritis). °DIAGNOSIS  °Lab tests or other studies, such as X-rays, electrocardiography, stress testing, or cardiac imaging, may be needed to find the cause of your pain.  °TREATMENT  °· Treatment depends on what may be causing your chest pain. Treatment may include: °· Acid blockers for heartburn. °· Anti-inflammatory medicine. °· Pain medicine for inflammatory conditions. °· Antibiotics if an infection is present. °· You may be advised to change lifestyle habits. This includes stopping smoking and avoiding alcohol, caffeine, and chocolate. °· You may be advised to keep your head raised (elevated) when sleeping. This reduces the chance of acid going backward from your stomach into your esophagus. °· Most of the time, nonspecific chest pain will improve within 2 to 3 days with rest and mild pain medicine. °HOME CARE INSTRUCTIONS  °· If antibiotics were prescribed, take your antibiotics as directed. Finish them even if you start to feel better. °· For the next few days, avoid physical  activities that bring on chest pain. Continue physical activities as directed. °· Do not smoke. °· Avoid drinking alcohol. °· Only take over-the-counter or prescription medicine for pain, discomfort, or fever as directed by your caregiver. °· Follow your caregiver's suggestions for further testing if your chest pain does not go away. °· Keep any follow-up appointments you made. If you do not go to an appointment, you could develop lasting (chronic) problems with pain. If there is any problem keeping an appointment, you must call to reschedule. °SEEK MEDICAL CARE IF:  °· You think you are having problems from the medicine you are taking. Read your medicine instructions carefully. °· Your chest pain does not go away, even after treatment. °· You develop a rash with blisters on your chest. °SEEK IMMEDIATE MEDICAL CARE IF:  °· You have increased chest pain or pain that spreads to your arm, neck, jaw, back, or abdomen. °· You develop shortness of breath, an increasing cough, or you are coughing up blood. °· You have severe back or abdominal pain, feel nauseous, or vomit. °· You develop severe weakness, fainting, or chills. °· You have a fever. °THIS IS AN EMERGENCY. Do not wait to see if the pain will go away. Get medical help at once. Call your local emergency services (911 in [place] Do not drive yourself to the hospital. °MAKE SURE YOU:  °· Understand these instructions. °· Will watch your condition. °· Will get help right away if you are not doing well or get worse. °Document Released: 02/11/2005 Document Revised: 07/27/2011 Document Reviewed: 12/08/2007 °[employer]® Patient Information ©2014 [employer]   [employer]. ° °

## 2013-07-31 NOTE — Progress Notes (Signed)
Chief Complaint:  Chief Complaint  Patient presents with  . Extremity Weakness    Feels a burning and weakness in right leg; believes it to be sciatica  . chest tightness    noticed last friday after excercise     HPI: Adam Peters is a 63 y.o. male who is here for:   He has sciatica and drives with his wallet in his pocket and he has pain going down his right leg from that. He went to [facility] about 4 weeks ago he went to bed and walked aorund th ecompund and he had burning pain and weakness and his legs was gong to give away. He has has a history of lumbar DJD. This has improved after doing exercised but he thinks he may have had injured himself during exercise. He did exercises where he was doing tchest stretches  In a cobra position on the floor and lifting himself off the mat, now he has discomfort in his chest. HE denies pain. He has discomfort in bilateral chest, bilateral arms and worse with movement of his arms. He does have discomfort when he walks   If he is pulling on the handrails or using his arms or movement of his arms then he has chest discomfort and has pain radiating down both arms. He has a h/o CAD s/p stent. No weakness, diaphoresis, Jaw pain. Denies SOB  Or wheezing. No HA. 1/10 pressure bilateral lateral chest , no pain.    History of coronary disease status post PCI of the LAD in 1997 (with a Palmaz-Schatz stent). He has history of smoking in the past but has discontinued that. Echo in 2010 showed a low normal EF and he had a stress test which was negative. 2012 he was contemplating treatment for hepatitis C and underwent a stress test which was negative for ischemia. At that time, EF was 51%. He is only taking asa 81 mg every other day for his coronary disease since it hurts his stomach. He is no longer on a statin due to his liver problems and did not feel that any other medication was necessary. Blood pressure has been higher in the past but has been normal.    He has seen Dr Patsy Baltimore at [facility] and he was supposed to et the new treatment for Hep C. He is waiting to get insurance authorization. Othewise he considered he will get to the [place]    Past Medical History  Diagnosis Date  . Arthritis   . DDD (degenerative disc disease), cervical 01/12/2012  . Coronary artery disease     s/p PCI to LAD (1997) - Dr. Loni Muse. [place]  . History of tobacco use   . Hepatitis C   . History of nuclear stress test 04/15/2011    bruce protocol myoview; negative for ischemia, low risk    Past Surgical History  Procedure Laterality Date  . Appendectomy  1965  . Coronary angioplasty with stent placement  06/09/1995    prox LAD stenting 80% to 0% (Dr. Rockne Menghini) - repeat cath in 11/1999 showed no significant coronary disease & normal systolic function (Dr. Gerrie Nordmann)  . Hernia repair  1994  . Knife wound repair  1969  . Transthoracic echocardiogram  11/13/2008    EF 50-55%, normal LV systolic function; RA mildly dilated; trace MR/TR/PR   History   Social History  . Marital Status: Married    Spouse Name: N/A    Number of Children: N/A  .  Years of Education: B.A.   Occupational History  .  [employer]   Social History Main Topics  . Smoking status: Former Smoker -- 1.00 packs/day    Types: Cigarettes    Quit date: 05/18/1993  . Smokeless tobacco: Never Used  . Alcohol Use: 1.8 oz/week    3 Glasses of wine per week     Comment: SOCIAL  . Drug Use: No  . Sexual Activity: None   Other Topics Concern  . None   Social History Narrative  . None   Family History  Problem Relation Age of Onset  . Asthma Mother   . Crohn's disease Mother   . Hepatitis C Brother   . Dementia Father    Allergies  Allergen Reactions  . Amoxicillin Diarrhea    GI upset.   Prior to Admission medications   Medication Sig Start Date End Date Taking? Authorizing Provider  albuterol (PROVENTIL HFA;VENTOLIN HFA) 108 (90 BASE) MCG/ACT inhaler Inhale 2 puffs into the  lungs every 4 (four) hours as needed for wheezing or shortness of breath (cough, shortness of breath or wheezing.). 04/27/13  Yes Posey Boyer, MD  amLODipine (NORVASC) 5 MG tablet Take 1 tablet (5 mg total) by mouth daily. 06/01/13  Yes Tommy Medal, RPH-CPP  aspirin 81 MG tablet Take 81 mg by mouth daily as needed.    Yes Historical Provider, MD  dorzolamide-timolol (COSOPT) 22.3-6.8 MG/ML ophthalmic solution Place 1 drop into both eyes 2 (two) times daily. 05/03/13  Yes Historical Provider, MD  fluticasone (FLONASE) 50 MCG/ACT nasal spray USE 2 SPRAYS IN EACH NOSTRIL EVERY DAY 06/19/12  Yes Dionne Bucy McClung, PA-C  triamcinolone (KENALOG) 0.025 % cream Apply topically 2 (two) times daily. 07/08/12  Yes Barton Fanny, MD  triamcinolone (KENALOG) 0.025 % ointment Apply 1 application topically as needed.   Yes Historical Provider, MD  amoxicillin-clavulanate (AUGMENTIN) 875-125 MG per tablet Take 1 tablet by mouth 2 (two) times daily. 06/29/13   Ellison Carwin, MD  chlorpheniramine-HYDROcodone (TUSSIONEX PENNKINETIC ER) 10-8 MG/5ML LQCR Take 5 mLs by mouth every 12 (twelve) hours as needed. 06/29/13   Ellison Carwin, MD  Dextromethorphan-Guaifenesin (MUCINEX DM MAXIMUM STRENGTH) 60-1200 MG TB12 Take 1 tablet by mouth every 12 (twelve) hours. 06/29/13   Ellison Carwin, MD  irbesartan (AVAPRO) 300 MG tablet Take 1 tablet (300 mg total) by mouth daily. 04/27/13   Tommy Medal, RPH-CPP  sildenafil (VIAGRA) 100 MG tablet Take 1 tablet (100 mg total) by mouth daily as needed for erectile dysfunction. 07/08/12   Barton Fanny, MD     ROS: The patient denies fevers, chills, night sweats, unintentional weight loss, palpitations, wheezing, dyspnea on exertion, nausea, vomiting, abdominal pain, dysuria, hematuria, melena, numbness, weakness, or tingling.   All other systems have been reviewed and were otherwise negative with the exception of those mentioned in the HPI and as above.     PHYSICAL EXAM: Filed Vitals:   07/31/13 1233  BP: 126/80  Pulse: 69  Temp: 97.8 F (36.6 C)  Resp: 16  SPo2 98% Filed Vitals:   07/31/13 1233  Height: 6\' 3"  (1.905 m)  Weight: 239 lb (108.41 kg)   Body mass index is 29.87 kg/(m^2).  General: Alert, no acute distress HEENT:  Normocephalic, atraumatic, oropharynx patent. EOMI, PERRLA Cardiovascular:  Sinus brady, no rubs murmurs or gallops.  No Carotid bruits, radial pulse intact. No pedal edema. He states chest and arms are tender when I move his arm above his head  Respiratory: Clear to auscultation bilaterally.  No wheezes, rales, or rhonchi.  No cyanosis, no use of accessory musculature GI: No organomegaly, abdomen is soft and non-tender, positive bowel sounds.  No masses. Skin: No rashes. Neurologic: Facial musculature symmetric. Psychiatric: Patient is appropriate throughout our interaction. Lymphatic: No cervical lymphadenopathy Musculoskeletal: Gait intact.   LABS: Results for orders placed in visit on 07/31/13  POCT CBC      Result Value Ref Range   WBC 4.9  4.6 - 10.2 K/uL   Lymph, poc 1.4  0.6 - 3.4   POC LYMPH PERCENT 27.7  10 - 50 %L   MID (cbc) 0.3  0 - 0.9   POC MID % 7.1  0 - 12 %M   POC Granulocyte 3.2  2 - 6.9   Granulocyte percent 65.2  37 - 80 %G   RBC 4.53 (*) 4.69 - 6.13 M/uL   Hemoglobin 13.9 (*) 14.1 - 18.1 g/dL   HCT, POC 44.1  43.5 - 53.7 %   MCV 97.3 (*) 80 - 97 fL   MCH, POC 30.7  27 - 31.2 pg   MCHC 31.5 (*) 31.8 - 35.4 g/dL   RDW, POC 13.1     Platelet Count, POC 191  142 - 424 K/uL   MPV 8.8  0 - 99.8 fL     EKG/XRAY:   Primary read interpreted by Dr. Marin Comment at [facility]. No acute cardiopulmonary process Sinus brady at 54, no St changes  ASSESSMENT/PLAN: Encounter Diagnoses  Name Primary?  . Chest discomfort Yes  . CAD (coronary artery disease) of artery bypass graft   . Pain in joint, lower leg   . Sciatic pain    I think this is more msk in origin rather than cardiovascular  since he has bialteral chest discomfort and arm discomfort with movement after doing new exercises for his back and leg pain, however he does have CAD s/p stent  EKG and CXR are WNL, CBC was also WNL He is supposed to take a baby apirin but he does not take it everyday, aggravates his GERD I will give him some flexeril for msk pain, if troponins are negtive he may take  Troponin pending, make appt with cardiologist Will f/u with troponin results Go to ER prn  Gross sideeffects, risk and benefits, and alternatives of medications d/w patient. Patient is aware that all medications have potential sideeffects and we are unable to predict every sideeffect or drug-drug interaction that may occur.  Tiffeny Minchew, [place], DO 07/31/2013 2:36 PM   08/01/2013--@ 12:30  Spoke to patient about labs. He has appt with Dr Debara Pickett today at 1:30. He has no worsening sxs, his chest discomfort and arm discomfort are currently not present Patient took flexeril about 3 hrs ago  Called Dr [facility] office to inform about slight elevation in Tropnin

## 2013-08-01 ENCOUNTER — Encounter: Payer: Self-pay | Admitting: Internal Medicine

## 2013-08-01 ENCOUNTER — Ambulatory Visit (INDEPENDENT_AMBULATORY_CARE_PROVIDER_SITE_OTHER): Payer: No Typology Code available for payment source | Admitting: Internal Medicine

## 2013-08-01 ENCOUNTER — Telehealth: Payer: Self-pay | Admitting: Physician Assistant

## 2013-08-01 ENCOUNTER — Telehealth: Payer: Self-pay | Admitting: Internal Medicine

## 2013-08-01 VITALS — BP 112/72 | HR 68 | Ht 74.0 in | Wt 240.5 lb

## 2013-08-01 DIAGNOSIS — D689 Coagulation defect, unspecified: Secondary | ICD-10-CM

## 2013-08-01 DIAGNOSIS — Z01818 Encounter for other preprocedural examination: Secondary | ICD-10-CM

## 2013-08-01 DIAGNOSIS — I214 Non-ST elevation (NSTEMI) myocardial infarction: Secondary | ICD-10-CM | POA: Insufficient documentation

## 2013-08-01 DIAGNOSIS — R5381 Other malaise: Secondary | ICD-10-CM

## 2013-08-01 DIAGNOSIS — R5383 Other fatigue: Secondary | ICD-10-CM

## 2013-08-01 DIAGNOSIS — R079 Chest pain, unspecified: Secondary | ICD-10-CM | POA: Insufficient documentation

## 2013-08-01 DIAGNOSIS — I2510 Atherosclerotic heart disease of native coronary artery without angina pectoris: Secondary | ICD-10-CM

## 2013-08-01 LAB — CBC
HCT: 39.1 % (ref 39.0–52.0)
Hemoglobin: 13.6 g/dL (ref 13.0–17.0)
MCH: 31.1 pg (ref 26.0–34.0)
MCHC: 34.8 g/dL (ref 30.0–36.0)
MCV: 89.3 fL (ref 78.0–100.0)
Platelets: 192 10*3/uL (ref 150–400)
RBC: 4.38 MIL/uL (ref 4.22–5.81)
RDW: 12.7 % (ref 11.5–15.5)
WBC: 4.2 10*3/uL (ref 4.0–10.5)

## 2013-08-01 LAB — BASIC METABOLIC PANEL
BUN: 11 mg/dL (ref 6–23)
CALCIUM: 9.4 mg/dL (ref 8.4–10.5)
CO2: 25 meq/L (ref 19–32)
CREATININE: 0.94 mg/dL (ref 0.50–1.35)
Chloride: 106 mEq/L (ref 96–112)
Glucose, Bld: 96 mg/dL (ref 70–99)
Potassium: 3.8 mEq/L (ref 3.5–5.3)
Sodium: 139 mEq/L (ref 135–145)

## 2013-08-01 LAB — PROTIME-INR
INR: 1.03 (ref <1.50)
Prothrombin Time: 13.4 seconds (ref 11.6–15.2)

## 2013-08-01 LAB — TROPONIN I
Troponin I: 0.21 ng/mL — ABNORMAL HIGH (ref <0.06)
Troponin I: 0.14 ng/mL — ABNORMAL HIGH (ref <0.06)

## 2013-08-01 LAB — APTT: aPTT: 28 seconds (ref 24–37)

## 2013-08-01 LAB — TSH: TSH: 1.066 u[IU]/mL (ref 0.350–4.500)

## 2013-08-01 NOTE — Telephone Encounter (Signed)
[facility] labs called to reports abnormal troponin.  0.14.  Less than previous.  Chele Cornell, PAC 9:17 PM

## 2013-08-01 NOTE — Patient Instructions (Addendum)
Your physician has requested that you have a cardiac catheterization TOMORROW 08/02/13 at [facility]. Cardiac catheterization is used to diagnose and/or treat various heart conditions. Doctors may recommend this procedure for a number of different reasons. The most common reason is to evaluate chest pain. Chest pain can be a symptom of coronary artery disease (CAD), and cardiac catheterization can show whether plaque is narrowing or blocking your heart's arteries. This procedure is also used to evaluate the valves, as well as measure the blood flow and oxygen levels in different parts of your heart. For further information please visit [email or web address]. Please follow instruction sheet, as given.   You will need to have blood work TODAY.   You will need to be FASTING at midnight TONIGHT

## 2013-08-01 NOTE — Progress Notes (Signed)
OFFICE NOTE  Chief Complaint:  Chest pain, dyspnea on exertion  Primary Care Physician: Kennon Portela, MD  HPI:  Adam Peters is a 63 year old gentleman who I have been following for history of coronary disease status post PCI of the LAD in 1997 (with a Palmaz-Schatz stent). He has history of smoking in the past but has discontinued that. Echo in 2010 showed a low normal EF and he had a stress test which was negative. Recently, this past fall he was contemplating treatment for hepatitis C and underwent a stress test which was negative for ischemia. At that time, EF was 51%. He, unfortunately, did not undergo treatment at that time; however, has since been set up to see Dr. Patsy Baltimore at [facility] by you who was contemplating treatment starting in December. He is also contemplating retirement at the end of January of next year.   When I last saw him about 6 months ago he was doing fairly well. I understand he was seen just a few days ago in urgent care. He had been doing some new exercise routine and had noted tightness after exercise in his chest. The symptoms were certainly worse with exertion and ultimately did relieve themselves mostly at rest. Since that time he's had worsening shortness of breath and discomfort in his chest especially when walking up stairs or doing most activities. He appears visibly disturbed by this today. He did have a chest x-ray performed which showed no acute disease. An EKG performed at his primary care provider's office showed sinus bradycardia with no ischemic changes. A troponin was checked and was positive at 0.21.  PMHx:  Past Medical History  Diagnosis Date  . Arthritis   . DDD (degenerative disc disease), cervical 01/12/2012  . Coronary artery disease     s/p PCI to LAD (1997) - Dr. Loni Muse. [place]  . History of tobacco use   . Hepatitis C   . History of nuclear stress test 04/15/2011    bruce protocol myoview; negative for ischemia, low risk      Past Surgical History  Procedure Laterality Date  . Appendectomy  1965  . Coronary angioplasty with stent placement  06/09/1995    prox LAD stenting 80% to 0% (Dr. Rockne Menghini) - repeat cath in 11/1999 showed no significant coronary disease & normal systolic function (Dr. Gerrie Nordmann)  . Hernia repair  1994  . Knife wound repair  1969  . Transthoracic echocardiogram  11/13/2008    EF 50-55%, normal LV systolic function; RA mildly dilated; trace MR/TR/PR    FAMHx:  Family History  Problem Relation Age of Onset  . Asthma Mother   . Crohn's disease Mother   . Hepatitis C Brother   . Dementia Father     SOCHx:   reports that he quit smoking about 20 years ago. His smoking use included Cigarettes. He smoked 1.00 pack per day. He has never used smokeless tobacco. He reports that he drinks about 1.8 ounces of alcohol per week. He reports that he does not use illicit drugs.  ALLERGIES:  Allergies  Allergen Reactions  . Amoxicillin Diarrhea    GI upset.    ROS: A comprehensive review of systems was negative except for: Cardiovascular: positive for dyspnea and exertional chest pressure/discomfort  HOME MEDS: Current Outpatient Prescriptions  Medication Sig Dispense Refill  . amLODipine (NORVASC) 5 MG tablet Take 1 tablet (5 mg total) by mouth daily.  30 tablet  3  . aspirin 81  MG tablet Take 81 mg by mouth daily as needed.       . cyclobenzaprine (FLEXERIL) 10 MG tablet Take 1/2-1 tab po TID prn for muscles spasms  30 tablet  0  . dorzolamide-timolol (COSOPT) 22.3-6.8 MG/ML ophthalmic solution Place 1 drop into both eyes 2 (two) times daily.      . fluticasone (FLONASE) 50 MCG/ACT nasal spray USE 2 SPRAYS IN EACH NOSTRIL EVERY DAY  16 g  5  . irbesartan (AVAPRO) 300 MG tablet Take 1 tablet (300 mg total) by mouth daily.  30 tablet  3  . sildenafil (VIAGRA) 100 MG tablet Take 1 tablet (100 mg total) by mouth daily as needed for erectile dysfunction.  10 tablet  5  . triamcinolone  (KENALOG) 0.025 % cream Apply topically 2 (two) times daily.  454 g  2  . triamcinolone (KENALOG) 0.025 % ointment Apply 1 application topically as needed.      Marland Kitchen albuterol (PROVENTIL HFA;VENTOLIN HFA) 108 (90 BASE) MCG/ACT inhaler Inhale 2 puffs into the lungs every 4 (four) hours as needed for wheezing or shortness of breath (cough, shortness of breath or wheezing.).  1 Inhaler  1   No current facility-administered medications for this visit.    LABS/IMAGING: Results for orders placed in visit on 07/31/13 (from the past 48 hour(s))  COMPREHENSIVE METABOLIC PANEL     Status: None   Collection Time    07/31/13  2:11 PM      Result Value Ref Range   Sodium 137  135 - 145 mEq/L   Potassium 4.1  3.5 - 5.3 mEq/L   Chloride 107  96 - 112 mEq/L   CO2 24  19 - 32 mEq/L   Glucose, Bld 80  70 - 99 mg/dL   BUN 12  6 - 23 mg/dL   Creat 0.91  0.50 - 1.35 mg/dL   Total Bilirubin 0.7  0.2 - 1.2 mg/dL   Alkaline Phosphatase 81  39 - 117 U/L   AST 25  0 - 37 U/L   ALT 24  0 - 53 U/L   Total Protein 7.9  6.0 - 8.3 g/dL   Albumin 4.1  3.5 - 5.2 g/dL   Calcium 9.6  8.4 - 10.5 mg/dL  TROPONIN I     Status: Abnormal   Collection Time    07/31/13  2:11 PM      Result Value Ref Range   Troponin I 0.21 (*) <0.06 ng/mL   Comment: Persistently increased troponin values in the range of 0.04 - 0.49     ng/mL can be seen in:           Unstable Angina           Congestive Heart Failure           Myocarditis           Chest Trauma           Arrhythmias           Late Presenting MI           COPD     ** Clinical Follow-up is recommended. **  POCT CBC     Status: Abnormal   Collection Time    07/31/13  2:14 PM      Result Value Ref Range   WBC 4.9  4.6 - 10.2 K/uL   Lymph, poc 1.4  0.6 - 3.4   POC LYMPH PERCENT 27.7  10 - 50 %L  MID (cbc) 0.3  0 - 0.9   POC MID % 7.1  0 - 12 %M   POC Granulocyte 3.2  2 - 6.9   Granulocyte percent 65.2  37 - 80 %G   RBC 4.53 (*) 4.69 - 6.13 M/uL   Hemoglobin  13.9 (*) 14.1 - 18.1 g/dL   HCT, POC 44.1  43.5 - 53.7 %   MCV 97.3 (*) 80 - 97 fL   MCH, POC 30.7  27 - 31.2 pg   MCHC 31.5 (*) 31.8 - 35.4 g/dL   RDW, POC 13.1     Platelet Count, POC 191  142 - 424 K/uL   MPV 8.8  0 - 99.8 fL   Dg Chest 2 View  07/31/2013   CLINICAL DATA:  Chest discomfort  EXAM: CHEST  2 VIEW  COMPARISON:  DG CHEST 2V dated 03/22/2013  FINDINGS: The heart size and mediastinal contours are within normal limits. Both lungs are clear. The visualized skeletal structures are unremarkable. Mild hyperinflation may suggest emphysema, unchanged.  IMPRESSION: No active cardiopulmonary disease.   Electronically Signed   By: Conchita Paris M.D.   On: 07/31/2013 15:05    VITALS: BP 112/72  Pulse 68  Ht 6\' 2"  (1.88 m)  Wt 240 lb 8 oz (109.09 kg)  BMI 30.87 kg/m2  EXAM: General appearance: alert and appears mildly upset Neck: no adenopathy, no carotid bruit, no JVD, supple, symmetrical, trachea midline and thyroid not enlarged, symmetric, no tenderness/mass/nodules Lungs: clear to auscultation bilaterally Heart: regular rate and rhythm, S1, S2 normal, no murmur, click, rub or gallop Abdomen: soft, non-tender; bowel sounds normal; no masses,  no organomegaly Extremities: extremities normal, atraumatic, no cyanosis or edema Pulses: 2+ and symmetric Skin: Skin color, texture, turgor normal. No rashes or lesions Neurologic: Grossly normal Psych: Mood, affect normal  EKG: (3/16 at urgent care - Sinus bradycardia)  ASSESSMENT: 1. Crescendo chest pain - NSTEMI by troponin, DOE 2. Coronary artery disease status post PCI in 1997 3. Hepatitis C-untreated 4. Dyslipidemia-not on a statin due to hepatitis  PLAN: 1.   Mr. Loughmiller seems to be having classic symptoms of unstable angina and now has NSTEMI by troponin elevation.  He does look somewhat unwell and reports that he gets short of breath and has chest discomfort with walking up stairs and doing minimal activities. This is  at least CCS 3 angina. Based on his abnormal troponin, I am highly suspicious that he had a coronary event and may have significant new coronary disease. I would recommend cardiac catheterization at our earliest convenience. I will plan to schedule blood work today including a repeat troponin. Hopefully we can get him in the Cath Lab tomorrow. He does not describe any positional changes in his symptoms, however a myopericarditis cannot be excluded if his coronaries are nonobstructive.  Thank you so is for referring him back to me for the evaluation of his chest pain.  Pixie Casino, MD, [facility] Attending Cardiologist [facility] [place] [facility] 08/01/2013, 5:48 PM

## 2013-08-02 ENCOUNTER — Ambulatory Visit (HOSPITAL_COMMUNITY)
Admission: RE | Admit: 2013-08-02 | Discharge: 2013-08-03 | Disposition: A | Discharge status: Home / Self Care | Payer: No Typology Code available for payment source | Source: Ambulatory Visit | Attending: Cardiovascular Disease | Admitting: Cardiovascular Disease

## 2013-08-02 ENCOUNTER — Encounter (HOSPITAL_COMMUNITY)
Admission: RE | Disposition: A | Discharge status: Home / Self Care | Payer: Self-pay | Source: Ambulatory Visit | Attending: Cardiovascular Disease

## 2013-08-02 ENCOUNTER — Encounter (HOSPITAL_COMMUNITY): Payer: Self-pay | Admitting: General Practice

## 2013-08-02 DIAGNOSIS — I214 Non-ST elevation (NSTEMI) myocardial infarction: Secondary | ICD-10-CM

## 2013-08-02 DIAGNOSIS — Z9861 Coronary angioplasty status: Secondary | ICD-10-CM | POA: Insufficient documentation

## 2013-08-02 DIAGNOSIS — R079 Chest pain, unspecified: Secondary | ICD-10-CM

## 2013-08-02 DIAGNOSIS — B1920 Unspecified viral hepatitis C without hepatic coma: Secondary | ICD-10-CM

## 2013-08-02 DIAGNOSIS — Z955 Presence of coronary angioplasty implant and graft: Secondary | ICD-10-CM

## 2013-08-02 DIAGNOSIS — I2510 Atherosclerotic heart disease of native coronary artery without angina pectoris: Secondary | ICD-10-CM

## 2013-08-02 DIAGNOSIS — E785 Hyperlipidemia, unspecified: Secondary | ICD-10-CM | POA: Diagnosis present

## 2013-08-02 DIAGNOSIS — M5030 Other cervical disc degeneration, unspecified cervical region: Secondary | ICD-10-CM | POA: Insufficient documentation

## 2013-08-02 DIAGNOSIS — I10 Essential (primary) hypertension: Secondary | ICD-10-CM | POA: Diagnosis present

## 2013-08-02 DIAGNOSIS — Z87891 Personal history of nicotine dependence: Secondary | ICD-10-CM | POA: Insufficient documentation

## 2013-08-02 DIAGNOSIS — IMO0002 Reserved for concepts with insufficient information to code with codable children: Secondary | ICD-10-CM | POA: Insufficient documentation

## 2013-08-02 DIAGNOSIS — Z7982 Long term (current) use of aspirin: Secondary | ICD-10-CM | POA: Insufficient documentation

## 2013-08-02 HISTORY — DX: Hyperlipidemia, unspecified: E78.5

## 2013-08-02 HISTORY — PX: LEFT HEART CATHETERIZATION WITH CORONARY ANGIOGRAM: SHX5451

## 2013-08-02 HISTORY — DX: Schizophrenia, unspecified: F20.9

## 2013-08-02 HISTORY — DX: Atherosclerotic heart disease of native coronary artery without angina pectoris: I25.10

## 2013-08-02 HISTORY — DX: Essential (primary) hypertension: I10

## 2013-08-02 HISTORY — PX: CORONARY STENT PLACEMENT: SHX1402

## 2013-08-02 LAB — POCT ACTIVATED CLOTTING TIME
ACTIVATED CLOTTING TIME: 276 s
Activated Clotting Time: 204 seconds

## 2013-08-02 SURGERY — LEFT HEART CATHETERIZATION WITH CORONARY ANGIOGRAM
Anesthesia: LOCAL

## 2013-08-02 MED ORDER — HEART ATTACK BOUNCING BOOK
Freq: Once | Status: DC
  Filled 2013-08-02: qty 1

## 2013-08-02 MED ORDER — DORZOLAMIDE HCL-TIMOLOL MAL 2-0.5 % OP SOLN
1.0000 [drp] | Freq: Two times a day (BID) | OPHTHALMIC | Status: DC
  Administered 2013-08-02: 1 [drp] via OPHTHALMIC
  Filled 2013-08-02: qty 10

## 2013-08-02 MED ORDER — SODIUM CHLORIDE 0.9 % IV SOLN: 250.0000 mL | INTRAVENOUS | Status: DC | PRN

## 2013-08-02 MED ORDER — NITROGLYCERIN 0.2 MG/ML ON CALL CATH LAB
INTRAVENOUS | Status: AC
  Filled 2013-08-02: qty 1

## 2013-08-02 MED ORDER — SODIUM CHLORIDE 0.9 % IV SOLN
INTRAVENOUS | Status: DC
  Administered 2013-08-02: 09:00:00 via INTRAVENOUS

## 2013-08-02 MED ORDER — FENTANYL CITRATE 0.05 MG/ML IJ SOLN
INTRAMUSCULAR | Status: AC
  Filled 2013-08-02: qty 2

## 2013-08-02 MED ORDER — HEPARIN SODIUM (PORCINE) 1000 UNIT/ML IJ SOLN
INTRAMUSCULAR | Status: AC
  Filled 2013-08-02: qty 1

## 2013-08-02 MED ORDER — ASPIRIN 81 MG PO CHEW
81.0000 mg | CHEWABLE_TABLET | Freq: Every day | ORAL | Status: DC
  Administered 2013-08-03: 11:00:00 81 mg via ORAL
  Filled 2013-08-02: qty 1

## 2013-08-02 MED ORDER — FLUTICASONE PROPIONATE 50 MCG/ACT NA SUSP
2.0000 | Freq: Every day | NASAL | Status: DC
  Administered 2013-08-02: 2 via NASAL
  Filled 2013-08-02: qty 16

## 2013-08-02 MED ORDER — IRBESARTAN 300 MG PO TABS: 300.0000 mg | ORAL_TABLET | Freq: Every day | ORAL | Status: DC

## 2013-08-02 MED ORDER — LIDOCAINE HCL (PF) 1 % IJ SOLN
INTRAMUSCULAR | Status: AC
  Filled 2013-08-02: qty 30

## 2013-08-02 MED ORDER — HEPARIN (PORCINE) IN NACL 2-0.9 UNIT/ML-% IJ SOLN
INTRAMUSCULAR | Status: AC
  Filled 2013-08-02: qty 1000

## 2013-08-02 MED ORDER — OXYCODONE-ACETAMINOPHEN 5-325 MG PO TABS: 1.0000 | ORAL_TABLET | ORAL | Status: DC | PRN

## 2013-08-02 MED ORDER — SODIUM CHLORIDE 0.9 % IJ SOLN
3.0000 mL | Freq: Two times a day (BID) | INTRAMUSCULAR | Status: DC
  Administered 2013-08-02: 3 mL via INTRAVENOUS

## 2013-08-02 MED ORDER — TICAGRELOR 90 MG PO TABS
ORAL_TABLET | ORAL | Status: AC
  Administered 2013-08-02: 90 mg via ORAL
  Filled 2013-08-02: qty 1

## 2013-08-02 MED ORDER — TICAGRELOR 90 MG PO TABS
ORAL_TABLET | ORAL | Status: AC
Start: 2013-08-02 — End: 2013-08-02
  Filled 2013-08-02: qty 1

## 2013-08-02 MED ORDER — TRIAMCINOLONE ACETONIDE 0.025 % EX CREA
TOPICAL_CREAM | Freq: Two times a day (BID) | CUTANEOUS | Status: DC
  Filled 2013-08-02: qty 15

## 2013-08-02 MED ORDER — AMLODIPINE BESYLATE 5 MG PO TABS
5.0000 mg | ORAL_TABLET | Freq: Every day | ORAL | Status: DC
  Administered 2013-08-02 – 2013-08-03 (×2): 5 mg via ORAL
  Filled 2013-08-02 (×2): qty 1

## 2013-08-02 MED ORDER — SODIUM CHLORIDE 0.9 % IJ SOLN: 3.0000 mL | INTRAMUSCULAR | Status: DC | PRN

## 2013-08-02 MED ORDER — IRBESARTAN 300 MG PO TABS
300.0000 mg | ORAL_TABLET | Freq: Every day | ORAL | Status: DC
  Administered 2013-08-02: 300 mg via ORAL
  Filled 2013-08-02 (×2): qty 1

## 2013-08-02 MED ORDER — TICAGRELOR 90 MG PO TABS
90.0000 mg | ORAL_TABLET | Freq: Two times a day (BID) | ORAL | Status: DC
  Administered 2013-08-02 – 2013-08-03 (×2): 90 mg via ORAL
  Filled 2013-08-02 (×3): qty 1

## 2013-08-02 MED ORDER — DIAZEPAM 5 MG PO TABS
5.0000 mg | ORAL_TABLET | ORAL | Status: AC
  Administered 2013-08-02: 5 mg via ORAL
  Filled 2013-08-02: qty 1

## 2013-08-02 MED ORDER — CYCLOBENZAPRINE HCL 10 MG PO TABS: 10.0000 mg | ORAL_TABLET | Freq: Three times a day (TID) | ORAL | Status: DC | PRN

## 2013-08-02 MED ORDER — ASPIRIN 81 MG PO TABS: 81.0000 mg | ORAL_TABLET | Freq: Every day | ORAL | Status: DC

## 2013-08-02 MED ORDER — MIDAZOLAM HCL 2 MG/2ML IJ SOLN
INTRAMUSCULAR | Status: AC
  Filled 2013-08-02: qty 2

## 2013-08-02 MED ORDER — ALBUTEROL SULFATE (2.5 MG/3ML) 0.083% IN NEBU: 2.5000 mg | INHALATION_SOLUTION | RESPIRATORY_TRACT | Status: DC | PRN

## 2013-08-02 MED ORDER — SODIUM CHLORIDE 0.9 % IV SOLN
1.0000 mL/kg/h | INTRAVENOUS | Status: AC
  Administered 2013-08-02 (×2): 1 mL/kg/h via INTRAVENOUS

## 2013-08-02 MED ORDER — ASPIRIN 81 MG PO CHEW
81.0000 mg | CHEWABLE_TABLET | ORAL | Status: AC
  Administered 2013-08-02: 81 mg via ORAL
  Filled 2013-08-02: qty 1

## 2013-08-02 MED ORDER — VERAPAMIL HCL 2.5 MG/ML IV SOLN
INTRAVENOUS | Status: AC
  Filled 2013-08-02: qty 2

## 2013-08-02 NOTE — H&P (View-Only) (Signed)
OFFICE NOTE  Chief Complaint:  Chest pain, dyspnea on exertion  Primary Care Physician: Kennon Portela, MD  HPI:  Adam Peters is a 63 year old gentleman who I have been following for history of coronary disease status post PCI of the LAD in 1997 (with a Palmaz-Schatz stent). He has history of smoking in the past but has discontinued that. Echo in 2010 showed a low normal EF and he had a stress test which was negative. Recently, this past fall he was contemplating treatment for hepatitis C and underwent a stress test which was negative for ischemia. At that time, EF was 51%. He, unfortunately, did not undergo treatment at that time; however, has since been set up to see Dr. Patsy Baltimore at [facility] by you who was contemplating treatment starting in December. He is also contemplating retirement at the end of January of next year.   When I last saw him about 6 months ago he was doing fairly well. I understand he was seen just a few days ago in urgent care. He had been doing some new exercise routine and had noted tightness after exercise in his chest. The symptoms were certainly worse with exertion and ultimately did relieve themselves mostly at rest. Since that time he's had worsening shortness of breath and discomfort in his chest especially when walking up stairs or doing most activities. He appears visibly disturbed by this today. He did have a chest x-ray performed which showed no acute disease. An EKG performed at his primary care provider's office showed sinus bradycardia with no ischemic changes. A troponin was checked and was positive at 0.21.  PMHx:  Past Medical History  Diagnosis Date  . Arthritis   . DDD (degenerative disc disease), cervical 01/12/2012  . Coronary artery disease     s/p PCI to LAD (1997) - Dr. Loni Muse. Little  . History of tobacco use   . Hepatitis C   . History of nuclear stress test 04/15/2011    bruce protocol myoview; negative for ischemia, low risk      Past Surgical History  Procedure Laterality Date  . Appendectomy  1965  . Coronary angioplasty with stent placement  06/09/1995    prox LAD stenting 80% to 0% (Dr. Rockne Menghini) - repeat cath in 11/1999 showed no significant coronary disease & normal systolic function (Dr. Gerrie Nordmann)  . Hernia repair  1994  . Knife wound repair  1969  . Transthoracic echocardiogram  11/13/2008    EF 50-55%, normal LV systolic function; RA mildly dilated; trace MR/TR/PR    FAMHx:  Family History  Problem Relation Age of Onset  . Asthma Mother   . Crohn's disease Mother   . Hepatitis C Brother   . Dementia Father     SOCHx:   reports that he quit smoking about 20 years ago. His smoking use included Cigarettes. He smoked 1.00 pack per day. He has never used smokeless tobacco. He reports that he drinks about 1.8 ounces of alcohol per week. He reports that he does not use illicit drugs.  ALLERGIES:  Allergies  Allergen Reactions  . Amoxicillin Diarrhea    GI upset.    ROS: A comprehensive review of systems was negative except for: Cardiovascular: positive for dyspnea and exertional chest pressure/discomfort  HOME MEDS: Current Outpatient Prescriptions  Medication Sig Dispense Refill  . amLODipine (NORVASC) 5 MG tablet Take 1 tablet (5 mg total) by mouth daily.  30 tablet  3  . aspirin 81  MG tablet Take 81 mg by mouth daily as needed.       . cyclobenzaprine (FLEXERIL) 10 MG tablet Take 1/2-1 tab po TID prn for muscles spasms  30 tablet  0  . dorzolamide-timolol (COSOPT) 22.3-6.8 MG/ML ophthalmic solution Place 1 drop into both eyes 2 (two) times daily.      . fluticasone (FLONASE) 50 MCG/ACT nasal spray USE 2 SPRAYS IN EACH NOSTRIL EVERY DAY  16 g  5  . irbesartan (AVAPRO) 300 MG tablet Take 1 tablet (300 mg total) by mouth daily.  30 tablet  3  . sildenafil (VIAGRA) 100 MG tablet Take 1 tablet (100 mg total) by mouth daily as needed for erectile dysfunction.  10 tablet  5  . triamcinolone  (KENALOG) 0.025 % cream Apply topically 2 (two) times daily.  454 g  2  . triamcinolone (KENALOG) 0.025 % ointment Apply 1 application topically as needed.      Marland Kitchen albuterol (PROVENTIL HFA;VENTOLIN HFA) 108 (90 BASE) MCG/ACT inhaler Inhale 2 puffs into the lungs every 4 (four) hours as needed for wheezing or shortness of breath (cough, shortness of breath or wheezing.).  1 Inhaler  1   No current facility-administered medications for this visit.    LABS/IMAGING: Results for orders placed in visit on 07/31/13 (from the past 48 hour(s))  COMPREHENSIVE METABOLIC PANEL     Status: None   Collection Time    07/31/13  2:11 PM      Result Value Ref Range   Sodium 137  135 - 145 mEq/L   Potassium 4.1  3.5 - 5.3 mEq/L   Chloride 107  96 - 112 mEq/L   CO2 24  19 - 32 mEq/L   Glucose, Bld 80  70 - 99 mg/dL   BUN 12  6 - 23 mg/dL   Creat 0.91  0.50 - 1.35 mg/dL   Total Bilirubin 0.7  0.2 - 1.2 mg/dL   Alkaline Phosphatase 81  39 - 117 U/L   AST 25  0 - 37 U/L   ALT 24  0 - 53 U/L   Total Protein 7.9  6.0 - 8.3 g/dL   Albumin 4.1  3.5 - 5.2 g/dL   Calcium 9.6  8.4 - 10.5 mg/dL  TROPONIN I     Status: Abnormal   Collection Time    07/31/13  2:11 PM      Result Value Ref Range   Troponin I 0.21 (*) <0.06 ng/mL   Comment: Persistently increased troponin values in the range of 0.04 - 0.49     ng/mL can be seen in:           Unstable Angina           Congestive Heart Failure           Myocarditis           Chest Trauma           Arrhythmias           Late Presenting MI           COPD     ** Clinical Follow-up is recommended. **  POCT CBC     Status: Abnormal   Collection Time    07/31/13  2:14 PM      Result Value Ref Range   WBC 4.9  4.6 - 10.2 K/uL   Lymph, poc 1.4  0.6 - 3.4   POC LYMPH PERCENT 27.7  10 - 50 %L  MID (cbc) 0.3  0 - 0.9   POC MID % 7.1  0 - 12 %M   POC Granulocyte 3.2  2 - 6.9   Granulocyte percent 65.2  37 - 80 %G   RBC 4.53 (*) 4.69 - 6.13 M/uL   Hemoglobin  13.9 (*) 14.1 - 18.1 g/dL   HCT, POC 44.1  43.5 - 53.7 %   MCV 97.3 (*) 80 - 97 fL   MCH, POC 30.7  27 - 31.2 pg   MCHC 31.5 (*) 31.8 - 35.4 g/dL   RDW, POC 13.1     Platelet Count, POC 191  142 - 424 K/uL   MPV 8.8  0 - 99.8 fL   Dg Chest 2 View  07/31/2013   CLINICAL DATA:  Chest discomfort  EXAM: CHEST  2 VIEW  COMPARISON:  DG CHEST 2V dated 03/22/2013  FINDINGS: The heart size and mediastinal contours are within normal limits. Both lungs are clear. The visualized skeletal structures are unremarkable. Mild hyperinflation may suggest emphysema, unchanged.  IMPRESSION: No active cardiopulmonary disease.   Electronically Signed   By: Conchita Paris M.D.   On: 07/31/2013 15:05    VITALS: BP 112/72  Pulse 68  Ht 6\' 2"  (1.88 m)  Wt 240 lb 8 oz (109.09 kg)  BMI 30.87 kg/m2  EXAM: General appearance: alert and appears mildly upset Neck: no adenopathy, no carotid bruit, no JVD, supple, symmetrical, trachea midline and thyroid not enlarged, symmetric, no tenderness/mass/nodules Lungs: clear to auscultation bilaterally Heart: regular rate and rhythm, S1, S2 normal, no murmur, click, rub or gallop Abdomen: soft, non-tender; bowel sounds normal; no masses,  no organomegaly Extremities: extremities normal, atraumatic, no cyanosis or edema Pulses: 2+ and symmetric Skin: Skin color, texture, turgor normal. No rashes or lesions Neurologic: Grossly normal Psych: Mood, affect normal  EKG: (3/16 at urgent care - Sinus bradycardia)  ASSESSMENT: 1. Crescendo chest pain - NSTEMI by troponin, DOE 2. Coronary artery disease status post PCI in 1997 3. Hepatitis C-untreated 4. Dyslipidemia-not on a statin due to hepatitis  PLAN: 1.   Mr. Loughmiller seems to be having classic symptoms of unstable angina and now has NSTEMI by troponin elevation.  He does look somewhat unwell and reports that he gets short of breath and has chest discomfort with walking up stairs and doing minimal activities. This is  at least CCS 3 angina. Based on his abnormal troponin, I am highly suspicious that he had a coronary event and may have significant new coronary disease. I would recommend cardiac catheterization at our earliest convenience. I will plan to schedule blood work today including a repeat troponin. Hopefully we can get him in the Cath Lab tomorrow. He does not describe any positional changes in his symptoms, however a myopericarditis cannot be excluded if his coronaries are nonobstructive.  Thank you so is for referring him back to me for the evaluation of his chest pain.  Pixie Casino, MD, [facility] Attending Cardiologist [facility] 08/01/2013, 5:48 PM

## 2013-08-02 NOTE — CV Procedure (Signed)
Cardiac Catheterization Procedure Note  Name: Adam Peters MRN: [id number] DOB: November 09, 1950  Procedure: Left Heart Cath, Selective Coronary Angiography, LV angiography, PTCA and stenting of the RCA  Indication: Crescendo angina, NSTEMI (outpatient troponin elevated)  Procedural Details:  The right wrist was prepped, draped, and anesthetized with 1% lidocaine. Using the modified Seldinger technique, a 5 French sheath was introduced into the right radial artery. 3 mg of verapamil was administered through the sheath, weight-based unfractionated heparin was administered intravenously. Standard Judkins catheters were used for selective coronary angiography and left ventriculography. Catheter exchanges were performed over an exchange length guidewire.  PROCEDURAL FINDINGS Hemodynamics: AO 105/60 LV 111/17   Coronary angiography: Coronary dominance: right  Left mainstem: The left main is patent with moderate calcification. The vessel arises from the left coronary cusp. It divides into the LAD and left circumflex. There is 20% distal left main stem stenosis.  Left anterior descending (LAD): The LAD is moderately calcified. There is a stent in the mid LAD just after the first septal perforator. The stented segment is patent through the proximal and mid portions. The distal stent has 50% stenosis. The diagonal branches are patent. The mid LAD at the origin of the second diagonal has 40% stenosis. The LAD wraps around the left ventricular apex. There is no evidence of high-grade stenosis throughout the LAD distribution.  Left circumflex (LCx): There is a large intermediate branch that divides into twin vessels and essentially supplies an OM 1/OM 2 distribution. That vessel has diffuse irregularity without significant stenosis. The AV circumflex is tortuous and small without significant stenosis.  Right coronary artery (RCA): The right coronary artery is dominant. There is a 95-99% stenosis  in the mid vessel. TIMI-3 flow is present. There is diffuse irregularity in the distal vessel. The vessel gives off a PDA and PLA branch.  Left ventriculography: The basal inferior wall has mild hypokinesis. The other LV wall segments contract normally. The estimated LVEF is 55%.   PCI Note:  Following the diagnostic procedure, the decision was made to proceed with PCI. The patient's culprit lesion was clearly with a tight stenosis in the mid RCA. Since his enzymes were positive and clinical picture suggestive of non-ST elevation infarction, brilinta was used. He was anticoagulated with IV heparin. A total of 15,000 units was administered throughout the procedure. His peak ACT was 276. Once a therapeutic ACT was achieved, a 6 [place] JR 4 guide catheter was inserted.  A cougar coronary guidewire was used to cross the lesion.  The lesion was predilated with a 2.5 x 12 mm balloon.  The lesion was then stented with a 3.5 x 16 mm Promus DES stent.  The stent was postdilated with a 3.75 mm noncompliant balloon.  Following PCI, there was 0% residual stenosis and TIMI-3 flow. Final angiography confirmed an excellent result. The patient tolerated the procedure well. There were no immediate procedural complications. A TR band was used for radial hemostasis. The patient was transferred to the post catheterization recovery area for further monitoring.  PCI Data: Vessel - RCA/Segment - mid Percent Stenosis (pre)  95 TIMI-flow 3 Stent 3.5 x 16 mm Promus premier DES Percent Stenosis (post) 0 TIMI-flow (post) 3  Final Conclusions:   1. Severe single-vessel coronary artery disease with successful PCI of the RCA using a drug-eluting stent 2. Mild to moderate LAD stenosis 3. Patent left circumflex with minor irregularity 4. Mild contraction abnormality the left ventricle with preserved overall LV function   Recommendations:  Dual antiplatelet therapy with aspirin and brilinta for 12 months. Risk reduction measures  per Dr. Debara Pickett. Aniticipate discharge tomorrow morning.  Adam Peters 08/02/2013, 11:19 AM

## 2013-08-02 NOTE — Telephone Encounter (Signed)
Spoke with patient.

## 2013-08-02 NOTE — Interval H&P Note (Signed)
History and Physical Interval Note:  08/02/2013 9:47 AM  [name]  has presented today for surgery, with the diagnosis of Chest Pain  The various methods of treatment have been discussed with the patient and family. After consideration of risks, benefits and other options for treatment, the patient has consented to  Procedure(s): LEFT HEART CATHETERIZATION WITH CORONARY ANGIOGRAM (N/A) as a surgical intervention .  The patient's history has been reviewed, patient examined, no change in status, stable for surgery.  I have reviewed the patient's chart and labs.  Questions were answered to the patient's satisfaction.    Cath Lab Visit (complete for each Cath Lab visit)  Clinical Evaluation Leading to the Procedure:   ACS: yes  Non-ACS:    Anginal Classification: CCS IV  Anti-ischemic medical therapy: Minimal Therapy (1 class of medications)  Non-Invasive Test Results: No non-invasive testing performed  Prior CABG: No previous CABG       Adam Peters

## 2013-08-02 NOTE — Progress Notes (Signed)
TR BAND REMOVAL  LOCATION:    right radial  DEFLATED PER PROTOCOL:    yes  TIME BAND OFF / DRESSING APPLIED:    1530   SITE UPON ARRIVAL:    Level 0  SITE AFTER BAND REMOVAL:    Level 0  REVERSE ALLEN'S TEST:     positive  CIRCULATION SENSATION AND MOVEMENT:    Within Normal Limits   yes  COMMENTS:   Gauze dressing applied at 1530. Rechecked 1600 without change, dressing dry and intact, CSMs wnls, ulnar and radial pulses +2.

## 2013-08-03 ENCOUNTER — Telehealth: Payer: Self-pay | Admitting: Cardiology

## 2013-08-03 ENCOUNTER — Encounter (HOSPITAL_COMMUNITY): Payer: Self-pay | Admitting: Nurse Practitioner

## 2013-08-03 ENCOUNTER — Telehealth: Payer: Self-pay | Admitting: Internal Medicine

## 2013-08-03 DIAGNOSIS — E785 Hyperlipidemia, unspecified: Secondary | ICD-10-CM | POA: Diagnosis present

## 2013-08-03 DIAGNOSIS — I10 Essential (primary) hypertension: Secondary | ICD-10-CM | POA: Diagnosis present

## 2013-08-03 DIAGNOSIS — I214 Non-ST elevation (NSTEMI) myocardial infarction: Secondary | ICD-10-CM

## 2013-08-03 LAB — BASIC METABOLIC PANEL
BUN: 12 mg/dL (ref 6–23)
CHLORIDE: 106 meq/L (ref 96–112)
CO2: 21 meq/L (ref 19–32)
Calcium: 9.1 mg/dL (ref 8.4–10.5)
Creatinine, Ser: 0.95 mg/dL (ref 0.50–1.35)
GFR calc Af Amer: >90 mL/min (ref >90)
GFR calc non Af Amer: 87 mL/min — ABNORMAL LOW (ref >90)
GLUCOSE: 100 mg/dL — AB (ref 70–99)
POTASSIUM: 4.7 meq/L (ref 3.7–5.3)
SODIUM: 140 meq/L (ref 137–147)

## 2013-08-03 LAB — CBC
HCT: 36.8 % — ABNORMAL LOW (ref 39.0–52.0)
HEMOGLOBIN: 12.4 g/dL — AB (ref 13.0–17.0)
MCH: 31.3 pg (ref 26.0–34.0)
MCHC: 33.7 g/dL (ref 30.0–36.0)
MCV: 92.9 fL (ref 78.0–100.0)
Platelets: 180 10*3/uL (ref 150–400)
RBC: 3.96 MIL/uL — AB (ref 4.22–5.81)
RDW: 12.6 % (ref 11.5–15.5)
WBC: 5.3 10*3/uL (ref 4.0–10.5)

## 2013-08-03 MED ORDER — TICAGRELOR 90 MG PO TABS: 90.0000 mg | ORAL_TABLET | Freq: Two times a day (BID) | ORAL | Status: DC

## 2013-08-03 MED ORDER — ATORVASTATIN CALCIUM 10 MG PO TABS: 10.0000 mg | ORAL_TABLET | Freq: Every day | ORAL | Status: DC

## 2013-08-03 MED ORDER — NITROGLYCERIN 0.4 MG SL SUBL: 0.4000 mg | SUBLINGUAL_TABLET | SUBLINGUAL | Status: DC | PRN

## 2013-08-03 MED ORDER — METOPROLOL TARTRATE 12.5 MG HALF TABLET: 12.5000 mg | ORAL_TABLET | Freq: Two times a day (BID) | ORAL | Status: DC

## 2013-08-03 MED ORDER — METOPROLOL TARTRATE 25 MG PO TABS: 12.5000 mg | ORAL_TABLET | Freq: Two times a day (BID) | ORAL | Status: DC

## 2013-08-03 NOTE — Progress Notes (Signed)
    Subjective:  The patient feels much better this morning. No further chest pain. No shortness of breath.  Objective:  Vital Signs in the last 24 hours: Temp:  [97.2 F (36.2 C)-98.2 F (36.8 C)] 98.2 F (36.8 C) (03/19 0740) Pulse Rate:  [55-66] 66 (03/19 0740) Resp:  [18-20] 18 (03/19 0740) BP: (126-180)/(69-107) 143/82 mmHg (03/19 0740) SpO2:  [96 %-100 %] 98 % (03/19 0740) Weight:  [241 lb 6.5 oz (109.5 kg)] 241 lb 6.5 oz (109.5 kg) (03/19 0020)  Intake/Output from previous day: 03/18 0701 - 03/19 0700 In: 1560.1 [P.O.:580; I.V.:980.1] Out: 1000 [Urine:1000]  Physical Exam: Pt is alert and oriented, NAD HEENT: normal Neck: JVP - normal Lungs: CTA bilaterally CV: RRR without murmur or gallop Abd: soft, NT, Positive BS, no hepatomegaly Ext: no C/C/E, distal pulses intact and equal, right radial site clear Skin: warm/dry no rash   Lab Results:  Recent Labs  08/01/13 1443 08/03/13 0340  WBC 4.2 5.3  HGB 13.6 12.4*  PLT 192 180    Recent Labs  08/01/13 1443 08/03/13 0340  NA 139 140  K 3.8 4.7  CL 106 106  CO2 25 21  GLUCOSE 96 100*  BUN 11 12  CREATININE 0.94 0.95    Recent Labs  07/31/13 1411 08/01/13 1443  TROPONINI 0.21* 0.14*   Tele: Personally reviewed, no arrhythmia noted.  Assessment/Plan:  1. Non-ST elevation MI. The patient was noted to have critical stenosis of his right coronary artery, treated successfully with PCI using a drug-eluting stent. He should remain on dual antiplatelet therapy with aspirin and brilinta for at least 12 months. He does not think he will be able to tolerate aspirin for this long as it has caused significant GI upset in the past. He will do his best, but I think as long as he stays on brilinta his risk of stent thrombosis should be quite low. Will arrange followup with Dr. Debara Pickett or his PA/NP within 2 weeks.  2. Hepatitis C. Considering treatment options.  3. Dyslipidemia. Has not been on a statin drug because  of hepatitis. Will review risk/benefit with Dr. Debara Pickett when he sees him in followup.  Sherren Mocha, M.D. 08/03/2013, 9:02 AM

## 2013-08-03 NOTE — Care Management Note (Signed)
    Page 1 of 1   08/03/2013     1:11:50 PM   CARE MANAGEMENT NOTE 08/03/2013  Patient:  Adam Peters, Adam Peters   Account Number:  [id number]  Date Initiated:  08/03/2013  Documentation initiated by:  Fuller Mandril  Subjective/Objective Assessment:   63 yo male with unstable angina and now has NSTEMI by troponin elevation.//Home with spouse     Action/Plan:   Procedure: Left Heart Cath, Selective Coronary Angiography, LV angiography, PTCA and stenting of the RCA//benefits check for Brilinta   Anticipated DC Date:  08/03/2013   Anticipated DC Plan:  HOME/SELF CARE         Choice offered to / List presented to:             Status of service:  Completed, signed off Medicare Important Message given?   (If response is "NO", the following Medicare IM given date fields will be blank) Date Medicare IM given:   Date Additional Medicare IM given:    Discharge Disposition:    Per UR Regulation:    If discussed at Long Length of Stay Meetings, dates discussed:    Comments:  08/03/13 Fuller Mandril, RN, BSN, NCM [phone] Spoke with pt at bedside regarding benefits check for Brilinta.  Pt has brochure with 30 day free card and refill assistance card intact.  Pt utilizes [facility] in [place] for prescription needs.  NCM called pharmacy to confirm availability of medication.  Information relayed to pt.  Pt verbalizes importance of filling medication upon discharge.

## 2013-08-03 NOTE — Discharge Summary (Signed)
Discharge Summary   Patient ID: Adam Peters,  MRN: [id number], DOB/AGE: 05/20/1950 63 y.o.  Admit date: 08/02/2013 Discharge date: 08/03/2013  Primary Care Provider: Kennon Portela Primary Cardiologist: C. [name], MD   Discharge Diagnoses Principal Problem:   Acute myocardial infarction, subendocardial infarction, initial episode of care  **S/p PCI and drug-eluting stent placement to the RCA this admission.  Active Problems:   CAD (coronary artery disease)   Hypertension   Hyperlipidemia   Hepatitis C  Allergies Allergies  Allergen Reactions  . Amoxicillin Diarrhea    GI upset.   Procedures  Cardiac Catheterization and Percutaneous Coronary Intervention 3.18.2015  PROCEDURAL FINDINGS Hemodynamics: AO 105/60 LV 111/17              Coronary angiography: Coronary dominance: right  Left mainstem: The left main is patent with moderate calcification. The vessel arises from the left coronary cusp. It divides into the LAD and left circumflex. There is 20% distal left main stem stenosis. Left anterior descending (LAD): The LAD is moderately calcified. There is a stent in the mid LAD just after the first septal perforator. The stented segment is patent through the proximal and mid portions. The distal stent has 50% stenosis. The diagonal branches are patent. The mid LAD at the origin of the second diagonal has 40% stenosis. The LAD wraps around the left ventricular apex. There is no evidence of high-grade stenosis throughout the LAD distribution. Left circumflex (LCx): There is a large intermediate branch that divides into twin vessels and essentially supplies an OM 1/OM 2 distribution. That vessel has diffuse irregularity without significant stenosis. The AV circumflex is tortuous and small without significant stenosis. Right coronary artery (RCA): The right coronary artery is dominant. There is a 95-99% stenosis in the mid vessel. TIMI-3 flow is present. There is  diffuse irregularity in the distal vessel. The vessel gives off a PDA and PLA branch.   **The RCA was successfully stented using a 3.5 x 16 mm Promus premier DES.**  Left ventriculography: The basal inferior wall has mild hypokinesis. The other LV wall segments contract normally. The estimated LVEF is 55%.  _____________   History of Present Illness  63 year old male with a prior history of coronary artery disease status post remote bare-metal stenting of the LAD in 1997 who was in his usual state of health until recently when he began to experience exertional chest tightness and dyspnea while exercising. He was seen by his primary care provider on March 16, and an ECG showed sinus bradycardia without ischemic changes. A troponin was evaluated and was found to be elevated at 0.21. He was referred to cardiology and seen on March 17 at which point troponin was repeated and again returned elevated at 0.14. Arrangements were made for diagnostic catheterization.  Hospital Course  Patient underwent diagnostic cardiac catheterization on March 18, revealing severe mid right coronary artery disease with otherwise nonobstructive CAD and normal LV function. The right coronary artery was felt to be the culprit for his non-ST segment elevation myocardial infarction and this was subsequently stented using a 3.5 by 59mm Promus Premier drug-eluting stent. Patient tolerated the procedure well and post procedure has been doing without recurrent symptoms or limitations. He has been placed on aspirin, and brilinta, beta blocker, and statin therapy and will be discharged home today in good condition.  Notably, low-dose statin therapy has been chosen in the setting of h/o hepatitis C (normal LFT's this admission).  He will require f/u lipids/lft's in 6  wks.  Discharge Vitals Blood pressure 143/82, pulse 66, temperature 98.2 F (36.8 C), temperature source Oral, resp. rate 18, height 6\' 2"  (1.88 m), weight 241 lb 6.5 oz  (109.5 kg), SpO2 98.00%.  Filed Weights   08/02/13 0826 08/03/13 0020  Weight: 240 lb (108.863 kg) 241 lb 6.5 oz (109.5 kg)    Labs  CBC  Recent Labs  08/01/13 1443 08/03/13 0340  WBC 4.2 5.3  HGB 13.6 12.4*  HCT 39.1 36.8*  MCV 89.3 92.9  PLT 192 [id number]   Basic Metabolic Panel  Recent Labs  08/01/13 1443 08/03/13 0340  NA 139 140  K 3.8 4.7  CL 106 106  CO2 25 21  GLUCOSE 96 100*  BUN 11 12  CREATININE 0.94 0.95  CALCIUM 9.4 9.1   Liver Function Tests  Recent Labs  07/31/13 1411  AST 25  ALT 24  ALKPHOS 81  BILITOT 0.7  PROT 7.9  ALBUMIN 4.1   Cardiac Enzymes  Recent Labs  07/31/13 1411 08/01/13 1443  TROPONINI 0.21* 0.14*   Thyroid Function Tests  Recent Labs  08/01/13 1443  TSH 1.066   Disposition  Pt is being discharged home today in good condition.  Follow-up Plans & Appointments      Follow-up Information   Follow up with GUEST, Veneda Melter, MD. (as scheduled.)    Specialty:  Internal Medicine   Contact information:   [place] [id number] [phone]       Follow up with Erlene Quan, PA-C On 08/16/2013. (9:00 AM - Dr. Lysbeth Penner PA)    Specialty:  Cardiology   Contact information:   [address] [place] [address] [phone]      Discharge Medications    Medication List    STOP taking these medications       sildenafil 100 MG tablet  Commonly known as:  VIAGRA      TAKE these medications       albuterol 108 (90 BASE) MCG/ACT inhaler  Commonly known as:  PROVENTIL HFA;VENTOLIN HFA  Inhale 2 puffs into the lungs every 4 (four) hours as needed for wheezing or shortness of breath (cough, shortness of breath or wheezing.).     amLODipine 5 MG tablet  Commonly known as:  NORVASC  Take 1 tablet (5 mg total) by mouth daily.     aspirin 81 MG tablet  Take 81 mg by mouth daily as needed.     atorvastatin 10 MG tablet  Commonly known as:  LIPITOR  Take 1 tablet (10 mg total) by  mouth daily.     cyclobenzaprine 10 MG tablet  Commonly known as:  FLEXERIL  Take 1/2-1 tab po TID prn for muscles spasms     dorzolamide-timolol 22.3-6.8 MG/ML ophthalmic solution  Commonly known as:  COSOPT  Place 1 drop into both eyes 2 (two) times daily.     fluticasone 50 MCG/ACT nasal spray  Commonly known as:  FLONASE  USE 2 SPRAYS IN EACH NOSTRIL EVERY DAY     irbesartan 300 MG tablet  Commonly known as:  AVAPRO  Take 1 tablet (300 mg total) by mouth daily.     metoprolol tartrate 25 MG tablet  Commonly known as:  LOPRESSOR  Take 0.5 tablets (12.5 mg total) by mouth 2 (two) times daily.     nitroGLYCERIN 0.4 MG SL tablet  Commonly known as:  NITROSTAT  Place 1 tablet (0.4 mg total) under the tongue every 5 (five) minutes  as needed for chest pain.     Ticagrelor 90 MG Tabs tablet  Commonly known as:  BRILINTA  Take 1 tablet (90 mg total) by mouth 2 (two) times daily.     triamcinolone 0.025 % cream  Commonly known as:  KENALOG  Apply topically 2 (two) times daily.       Outstanding Labs/Studies  Lipids/lft's in 6 wks.  Duration of Discharge Encounter   Greater than 30 minutes including physician time.  Signed, Murray Hodgkins NP 08/03/2013, 11:07 AM

## 2013-08-03 NOTE — Telephone Encounter (Signed)
TCM calls made by [employer].  Message forwarded to L. Kilroy, PA-C.

## 2013-08-03 NOTE — Telephone Encounter (Signed)
TCM follow up. Pt doing well, he has all his medications and will keep his follow up.  Kerin Ransom PA-C 08/03/2013 2:07 PM

## 2013-08-03 NOTE — Plan of Care (Signed)
Problem: Consults Goal: MI Patient Education (See Patient Education module for education specifics.) Outcome: Progressing Post radial cath instruction sheet given and reviewed with pt.  Bouncing back from heart attack book and stent book given, pt watched MI educational video.  Discussed Brillinta, gave Brillinta informational packet with free 30 day and co-pay card, and discussed importance of medication compliance.  Pt states has had problems with reflux in past.  Encouraged pt to discuss this with MD and to NOT stop taking Brillinta until cardiologist advises him to.  Told pt to take Brillinta with food.  Informed him of risk of heart attack with possible death if not compliant with antiplatelet medication.  Pt voices understanding.  Discussed risk factors.  Pt already exercises regularly and follows heart healthy diet at home. Questions answered.  Denies pain.  Rt radial level 0.

## 2013-08-03 NOTE — Discharge Instructions (Signed)
**  PLEASE REMEMBER TO BRING ALL OF YOUR MEDICATIONS TO EACH OF YOUR FOLLOW-UP OFFICE VISITS. ° °NO HEAVY LIFTING X 2 WEEKS. °NO SEXUAL ACTIVITY X 2 WEEKS. °NO DRIVING X 1 WEEK. °NO SOAKING BATHS, HOT TUBS, POOLS, ETC., X 7 DAYS. ° °Radial Site Care °Refer to this sheet in the next few weeks. These instructions provide you with information on caring for yourself after your procedure. Your caregiver may also give you more specific instructions. Your treatment has been planned according to current medical practices, but problems sometimes occur. Call your caregiver if you have any problems or questions after your procedure. °HOME CARE INSTRUCTIONS °· You may shower the day after the procedure. Remove the bandage (dressing) and gently wash the site with plain soap and water. Gently pat the site dry.  °· Do not apply powder or lotion to the site.  °· Do not submerge the affected site in water for 3 to 5 days.  °· Inspect the site at least twice daily.  °· Do not flex or bend the affected arm for 24 hours.  °· No lifting over 5 pounds (2.3 kg) for 5 days after your procedure.  ° °What to expect: °· Any bruising will usually fade within 1 to 2 weeks.  °· Blood that collects in the tissue (hematoma) may be painful to the touch. It should usually decrease in size and tenderness within 1 to 2 weeks.  °SEEK IMMEDIATE MEDICAL CARE IF: °· You have unusual pain at the radial site.  °· You have redness, warmth, swelling, or pain at the radial site.  °· You have drainage (other than a small amount of blood on the dressing).  °· You have chills.  °· You have a fever or persistent symptoms for more than 72 hours.  °· You have a fever and your symptoms suddenly get worse.  °· Your arm becomes pale, cool, tingly, or numb.  °· You have heavy bleeding from the site. Hold pressure on the site.  ° °

## 2013-08-03 NOTE — Progress Notes (Signed)
CARDIAC REHAB PHASE I   PRE:  Rate/Rhythm: 7 SR  BP:  Supine: 143/82  Sitting:   Standing:    SaO2:   MODE:  Ambulation: 1000 ft   POST:  Rate/Rhythm: 76 SR  BP:  Supine:   Sitting: 157/88  Standing:    SaO2:  0740-0847 Pt walked 1000 ft on RA with steady gait. Tolerated well. No CP. Education completed with pt who voiced understanding. Discussed CRP 2 and pt gave permission to refer to [facility]. Has brilinta booklet.   Graylon Good, RN BSN  08/03/2013 8:42 AM

## 2013-08-03 NOTE — Telephone Encounter (Signed)
New message    7-14 days TCM per Gerald Stabs B pa.    appt with Lurena Joiner on 4/1 @ 9 am

## 2013-08-08 ENCOUNTER — Telehealth: Payer: Self-pay | Admitting: *Deleted

## 2013-08-08 NOTE — Telephone Encounter (Signed)
Faxed orders to [facility] - Phase II

## 2013-08-14 ENCOUNTER — Telehealth: Payer: Self-pay | Admitting: Cardiology

## 2013-08-14 NOTE — Telephone Encounter (Signed)
Please call,having problems catching hisbreath at times.

## 2013-08-14 NOTE — Telephone Encounter (Signed)
Returned call and pt verified x 2.  Pt stated every now and then he notices that he has to really concentrate and take a deep breath.  Stated he notices every now and then he is SOB.  Denied CP or swelling.    Dr. Sallyanne Kuster notified and advised likely SE of Brilinta.  It's annoying, but not life-threatening.  Advised pt keep f/u appt on Wednesday.    Pt informed and verbalized understanding.  Stated he thought he read that somewhere and agreed w/ plan.

## 2013-08-16 ENCOUNTER — Encounter: Payer: Self-pay | Admitting: Cardiology

## 2013-08-16 ENCOUNTER — Ambulatory Visit (INDEPENDENT_AMBULATORY_CARE_PROVIDER_SITE_OTHER): Payer: No Typology Code available for payment source | Admitting: Cardiology

## 2013-08-16 VITALS — BP 120/70 | HR 68 | Ht 74.0 in | Wt 233.0 lb

## 2013-08-16 DIAGNOSIS — E785 Hyperlipidemia, unspecified: Secondary | ICD-10-CM

## 2013-08-16 DIAGNOSIS — I2510 Atherosclerotic heart disease of native coronary artery without angina pectoris: Secondary | ICD-10-CM

## 2013-08-16 DIAGNOSIS — I214 Non-ST elevation (NSTEMI) myocardial infarction: Secondary | ICD-10-CM

## 2013-08-16 DIAGNOSIS — I10 Essential (primary) hypertension: Secondary | ICD-10-CM

## 2013-08-16 MED ORDER — OMEPRAZOLE 20 MG PO CPDR: 20.0000 mg | DELAYED_RELEASE_CAPSULE | Freq: Every day | ORAL | Status: DC

## 2013-08-16 NOTE — Assessment & Plan Note (Signed)
Nl LFTs °

## 2013-08-16 NOTE — Assessment & Plan Note (Signed)
Controlled.  

## 2013-08-16 NOTE — Assessment & Plan Note (Signed)
Low dose statin

## 2013-08-16 NOTE — Patient Instructions (Signed)
Your physician recommends that you schedule a follow-up appointment in: 3 months with Dr Debara Pickett We will refer you to cardiac rehab Start Omeprazole 20 mg daily

## 2013-08-16 NOTE — Progress Notes (Signed)
08/16/2013 [name]   May 29, 1950  [id number]  Primary Physicia GUEST, Veneda Melter, MD Primary Cardiologist: Dr Debara Pickett  HPI The pt is a 63 year old gentleman who has a history of coronary disease status post PCI of the LAD in 1997 (with a Palmaz-Schatz stent). He has history of smoking in the past but has discontinued that in '97. Echo in 2010 showed a low normal EF and he had a stress test which was negative. He was seen by Dr Debara Pickett 08/01/13 in the office. He had been doing some new exercise routine and had noted chest tightness after exercise.  An EKG performed at his primary care provider's office showed sinus bradycardia with no ischemic changes. A troponin was checked and was positive at 0.21. He was admitted on the 18th for cath and subsequently had an RCA DES placed. He is here today for follow up. He feels better, he does note some vague SOB.    Current Outpatient Prescriptions  Medication Sig Dispense Refill  . amLODipine (NORVASC) 5 MG tablet Take 1 tablet (5 mg total) by mouth daily.  30 tablet  3  . aspirin 81 MG tablet Take 81 mg by mouth daily as needed.       Marland Kitchen atorvastatin (LIPITOR) 10 MG tablet Take 1 tablet (10 mg total) by mouth daily.  30 tablet  6  . cyclobenzaprine (FLEXERIL) 10 MG tablet Take 1/2-1 tab po TID prn for muscles spasms  30 tablet  0  . dorzolamide-timolol (COSOPT) 22.3-6.8 MG/ML ophthalmic solution Place 1 drop into both eyes 2 (two) times daily.      . fluticasone (FLONASE) 50 MCG/ACT nasal spray USE 2 SPRAYS IN EACH NOSTRIL EVERY DAY  16 g  5  . irbesartan (AVAPRO) 300 MG tablet Take 1 tablet (300 mg total) by mouth daily.  30 tablet  3  . metoprolol tartrate (LOPRESSOR) 25 MG tablet Take 0.5 tablets (12.5 mg total) by mouth 2 (two) times daily.  30 tablet  3  . nitroGLYCERIN (NITROSTAT) 0.4 MG SL tablet Place 1 tablet (0.4 mg total) under the tongue every 5 (five) minutes as needed for chest pain.  25 tablet  3  . Ticagrelor (BRILINTA) 90 MG  TABS tablet Take 1 tablet (90 mg total) by mouth 2 (two) times daily.  60 tablet  6  . triamcinolone (KENALOG) 0.025 % cream Apply topically 2 (two) times daily.  454 g  2  . albuterol (PROVENTIL HFA;VENTOLIN HFA) 108 (90 BASE) MCG/ACT inhaler Inhale 2 puffs into the lungs every 4 (four) hours as needed for wheezing or shortness of breath (cough, shortness of breath or wheezing.).  1 Inhaler  1  . omeprazole (PRILOSEC) 20 MG capsule Take 1 capsule (20 mg total) by mouth daily.  90 capsule  3   No current facility-administered medications for this visit.    Allergies  Allergen Reactions  . Amoxicillin Diarrhea    GI upset.    History   Social History  . Marital Status: Married    Spouse Name: N/A    Number of Children: N/A  . Years of Education: B.A.   Occupational History  .  [employer]   Social History Main Topics  . Smoking status: Former Smoker -- 1.00 packs/day    Types: Cigarettes    Quit date: 05/18/1993  . Smokeless tobacco: Never Used  . Alcohol Use: 1.8 oz/week    3 Glasses of wine per week     Comment: SOCIAL  .  Drug Use: No  . Sexual Activity: Not on file   Other Topics Concern  . Not on file   Social History Narrative  . No narrative on file     Review of Systems: General: negative for chills, fever, night sweats or weight changes.  Cardiovascular: negative for chest pain, dyspnea on exertion, edema, orthopnea, palpitations, paroxysmal nocturnal dyspnea or shortness of breath Dermatological: negative for rash Respiratory: negative for cough or wheezing Urologic: negative for hematuria Abdominal: negative for nausea, vomiting, diarrhea, bright red blood per rectum, melena, or hematemesis Neurologic: negative for visual changes, syncope, or dizziness All other systems reviewed and are otherwise negative except as noted above.    Blood pressure 120/70, pulse 68, height 6\' 2"  (1.88 m), weight 233 lb (105.688 kg).  General appearance: alert,  cooperative and no distress Neck: no carotid bruit and no JVD Lungs: clear to auscultation bilaterally Heart: regular rate and rhythm    ASSESSMENT AND PLAN:   NSTEMI - 08/01/12 (Troponin 0.21) .  Hypertension Controlled  Hyperlipidemia Low dose statin  Hepatitis C Nl LFTs  CAD- LAD BMS '97, RCA DES 08/02/13 .    PLAN  I suspect his dyspnea id from Brilinta, he will let us know if he feels this is intolerable after a month of Rx. He asked about cardiac rehab and we will refer him. He can follow up with Dr Debara Pickett in 3 months.   Texie Tupou KPA-C 08/16/2013 9:24 AM

## 2013-08-24 ENCOUNTER — Encounter (HOSPITAL_COMMUNITY)
Admission: RE | Admit: 2013-08-24 | Discharge: 2013-08-24 | Disposition: A | Discharge status: Home / Self Care | Payer: No Typology Code available for payment source | Source: Ambulatory Visit | Attending: Internal Medicine | Admitting: Internal Medicine

## 2013-08-24 DIAGNOSIS — I214 Non-ST elevation (NSTEMI) myocardial infarction: Secondary | ICD-10-CM | POA: Insufficient documentation

## 2013-08-24 DIAGNOSIS — I2510 Atherosclerotic heart disease of native coronary artery without angina pectoris: Secondary | ICD-10-CM | POA: Insufficient documentation

## 2013-08-24 DIAGNOSIS — Z5189 Encounter for other specified aftercare: Secondary | ICD-10-CM | POA: Insufficient documentation

## 2013-08-24 DIAGNOSIS — Z9861 Coronary angioplasty status: Secondary | ICD-10-CM | POA: Insufficient documentation

## 2013-08-24 NOTE — Progress Notes (Signed)
Cardiac Rehab Medication Review by a Pharmacist  Does the patient  feel that his/her medications are working for him/her?  yes  Has the patient been experiencing any side effects to the medications prescribed?  yes  Does the patient measure his/her own blood pressure or blood glucose at home?  yes   Does the patient have any problems obtaining medications due to transportation or finances?   no  Understanding of regimen: excellent Understanding of indications: excellent Potential of compliance: excellent    Pharmacist comments: Patient seems to being doing well and states he feels the best he ever has.  He had several questions about erectile dysfunction.  I informed him that he would need to confirm with his cardiologist that his heart was healthy for sexual activity and discuss medication options with him.  We also discussed good sleep hygiene as he is napping during the day and unable to sleep at night. Overall he seems to be doing well.   Thank you, Vivia Ewing, PharmD Clinical Pharmacist - Resident Pager: [phone] Pharmacy: [phone] 08/24/2013 9:17 AM

## 2013-08-28 ENCOUNTER — Encounter (HOSPITAL_COMMUNITY): Payer: No Typology Code available for payment source

## 2013-08-28 ENCOUNTER — Encounter (HOSPITAL_COMMUNITY)
Admission: RE | Admit: 2013-08-28 | Discharge: 2013-08-28 | Disposition: A | Discharge status: Home / Self Care | Payer: No Typology Code available for payment source | Source: Ambulatory Visit | Attending: Internal Medicine | Admitting: Internal Medicine

## 2013-08-28 DIAGNOSIS — Z5189 Encounter for other specified aftercare: Secondary | ICD-10-CM | POA: Diagnosis not present

## 2013-08-28 DIAGNOSIS — I2510 Atherosclerotic heart disease of native coronary artery without angina pectoris: Secondary | ICD-10-CM | POA: Diagnosis not present

## 2013-08-28 DIAGNOSIS — Z9861 Coronary angioplasty status: Secondary | ICD-10-CM | POA: Diagnosis not present

## 2013-08-28 DIAGNOSIS — I214 Non-ST elevation (NSTEMI) myocardial infarction: Secondary | ICD-10-CM | POA: Diagnosis not present

## 2013-08-28 NOTE — Progress Notes (Signed)
Pt in today for his first day of exercise at the 11:15 exercise class.  Pt tolerated light exercise with no complaints.  Monitor showed SR with tall T wave noted which is similar to his most recent EKG.  PHQ2 score 0.  Pt has great support from his wife and had scores above 27% on his quality of life survey.   Pt short time goal is to improve sciatica, lose weight and return to the [facility].  Will plan to continue to monitor his progress toward this goal.  Will also plan to discuss home exercise in the next couple of exercise sessions so that pt will feel confident about his return to exercise at the [facility]. Pt long term goal is to lose 10 pounds which is highly achievable as a long term goal. Pt would also like to return to the [facility] like before- normal routine.  Will continue to reassess. Cherre Huger, BSN

## 2013-08-30 ENCOUNTER — Encounter (HOSPITAL_COMMUNITY): Payer: No Typology Code available for payment source

## 2013-08-30 ENCOUNTER — Encounter: Payer: Self-pay | Admitting: Family Medicine

## 2013-08-30 ENCOUNTER — Encounter (HOSPITAL_COMMUNITY)
Admission: RE | Admit: 2013-08-30 | Discharge: 2013-08-30 | Disposition: A | Discharge status: Home / Self Care | Payer: No Typology Code available for payment source | Source: Ambulatory Visit | Attending: Internal Medicine | Admitting: Internal Medicine

## 2013-08-30 DIAGNOSIS — Z5189 Encounter for other specified aftercare: Secondary | ICD-10-CM | POA: Diagnosis not present

## 2013-08-30 DIAGNOSIS — B1920 Unspecified viral hepatitis C without hepatic coma: Secondary | ICD-10-CM

## 2013-09-01 ENCOUNTER — Encounter (HOSPITAL_COMMUNITY): Payer: No Typology Code available for payment source

## 2013-09-01 ENCOUNTER — Encounter (HOSPITAL_COMMUNITY)
Admission: RE | Admit: 2013-09-01 | Discharge: 2013-09-01 | Disposition: A | Discharge status: Home / Self Care | Payer: No Typology Code available for payment source | Source: Ambulatory Visit | Attending: Internal Medicine | Admitting: Internal Medicine

## 2013-09-01 DIAGNOSIS — Z5189 Encounter for other specified aftercare: Secondary | ICD-10-CM | POA: Diagnosis not present

## 2013-09-04 ENCOUNTER — Encounter (HOSPITAL_COMMUNITY)
Admission: RE | Admit: 2013-09-04 | Discharge: 2013-09-04 | Disposition: A | Discharge status: Home / Self Care | Payer: No Typology Code available for payment source | Source: Ambulatory Visit | Attending: Internal Medicine | Admitting: Internal Medicine

## 2013-09-04 ENCOUNTER — Encounter (HOSPITAL_COMMUNITY): Payer: No Typology Code available for payment source

## 2013-09-04 DIAGNOSIS — Z5189 Encounter for other specified aftercare: Secondary | ICD-10-CM | POA: Diagnosis not present

## 2013-09-06 ENCOUNTER — Encounter (HOSPITAL_COMMUNITY)
Admission: RE | Admit: 2013-09-06 | Discharge: 2013-09-06 | Disposition: A | Discharge status: Home / Self Care | Payer: No Typology Code available for payment source | Source: Ambulatory Visit | Attending: Internal Medicine | Admitting: Internal Medicine

## 2013-09-06 ENCOUNTER — Encounter (HOSPITAL_COMMUNITY): Payer: No Typology Code available for payment source

## 2013-09-06 DIAGNOSIS — Z5189 Encounter for other specified aftercare: Secondary | ICD-10-CM | POA: Diagnosis not present

## 2013-09-06 NOTE — Progress Notes (Signed)
Reviewed home exercise with pt today.  Pt plans to continue walking and to return to [facility] for exercise.  Pt is interested in lifting weights again, clearance request sent to Dr. Debara Pickett.  Reviewed THR, pulse, RPE, sign and symptoms, NTG use, and when to call 911 or MD.  Pt voiced understanding. Alberteen Sam, MA, ACSM RCEP

## 2013-09-08 ENCOUNTER — Encounter (HOSPITAL_COMMUNITY)
Admission: RE | Admit: 2013-09-08 | Discharge: 2013-09-08 | Disposition: A | Discharge status: Home / Self Care | Payer: No Typology Code available for payment source | Source: Ambulatory Visit | Attending: Internal Medicine | Admitting: Internal Medicine

## 2013-09-08 ENCOUNTER — Encounter (HOSPITAL_COMMUNITY): Payer: No Typology Code available for payment source

## 2013-09-08 ENCOUNTER — Other Ambulatory Visit: Payer: Self-pay | Admitting: Pharmacist Clinician (PhC)/ Clinical Pharmacy Specialist

## 2013-09-08 DIAGNOSIS — Z5189 Encounter for other specified aftercare: Secondary | ICD-10-CM | POA: Diagnosis not present

## 2013-09-11 ENCOUNTER — Encounter (HOSPITAL_COMMUNITY)
Admission: RE | Admit: 2013-09-11 | Discharge: 2013-09-11 | Disposition: A | Discharge status: Home / Self Care | Payer: No Typology Code available for payment source | Source: Ambulatory Visit | Attending: Internal Medicine | Admitting: Internal Medicine

## 2013-09-11 ENCOUNTER — Encounter (HOSPITAL_COMMUNITY): Payer: No Typology Code available for payment source

## 2013-09-11 DIAGNOSIS — Z5189 Encounter for other specified aftercare: Secondary | ICD-10-CM | POA: Diagnosis not present

## 2013-09-11 NOTE — Telephone Encounter (Signed)
Rx refill sent to patients pharmacy  

## 2013-09-12 ENCOUNTER — Other Ambulatory Visit: Payer: No Typology Code available for payment source

## 2013-09-12 ENCOUNTER — Other Ambulatory Visit: Payer: Self-pay | Admitting: Internal Medicine

## 2013-09-12 DIAGNOSIS — B182 Chronic viral hepatitis C: Secondary | ICD-10-CM

## 2013-09-13 ENCOUNTER — Encounter (HOSPITAL_COMMUNITY)
Admission: RE | Admit: 2013-09-13 | Discharge: 2013-09-13 | Disposition: A | Discharge status: Home / Self Care | Payer: No Typology Code available for payment source | Source: Ambulatory Visit | Attending: Internal Medicine | Admitting: Internal Medicine

## 2013-09-13 ENCOUNTER — Encounter (HOSPITAL_COMMUNITY): Payer: No Typology Code available for payment source

## 2013-09-13 DIAGNOSIS — Z5189 Encounter for other specified aftercare: Secondary | ICD-10-CM | POA: Diagnosis not present

## 2013-09-13 NOTE — Progress Notes (Signed)
Adam Peters 63 y.o. male Nutrition Note Spoke with pt. Nutrition Survey reviewed with pt. Pt has little room for improvement. Pt is following Step 2 of the Therapeutic Lifestyle Changes diet. Pt wants to lose wt and has been losing weight. Pt's weight today was 100.8 kg. At the start of the program pt weighed 104.2 kg. Pt wt is down 3.4 kg (7.5 lb) over the past 3 weeks. Rate of wt loss slightly exceeds desired rate. Pt has been trying to lose wt by watching his portions sizes, exercising/walking more, and decreasing his saturated fat and trans fats intake as well as limiting his added sugars in his diet. Wt loss tips reviewed. Pt reports he has been mainly eating a lot of fish and limits his red meat intake. Pt has been increasing his fruit and vegetable intake and reports he has been making smoothies to incorporate more fruits in his diet. Pt questioned about what ice cream serving size is and how often he can have it. Pt was educated about appropriate serving sizes and to keep in mind of moderation. Pt expressed understanding of the information reviewed. Pt aware of nutrition education classes offered and plans on attending nutrition classes.  Nutrition Diagnosis   Food-and nutrition-related knowledge deficit related to lack of exposure to information as related to diagnosis of: ? CVD   Overweight related to excessive energy intake as evidenced by a BMI of 28.7  Nutrition Intervention   Benefits of adopting Therapeutic Lifestyle Changes discussed when Medficts reviewed.   Pt to attend the Portion Distortion class-- met 09/06/13   Pt to attend the  ? Nutrition I class                     ? Nutrition II class   Continue client-centered nutrition education by RD, as part of interdisciplinary care.  Goal(s)   Pt to describe the potential benefits of adopting Therapeutic Lifestyle Changes   Pt to identify food quantities necessary to achieve: ? wt loss to a goal wt of 205-223 lb (93.3-101 kg)  at graduation from cardiac rehab.    Pt to describe the benefit of including fruits, vegetables, whole grains, and low-fat dairy products in a heart healthy meal plan.  Monitor and Evaluate progress toward nutrition goal with team. Nutrition Risk:  [place] Intern   09/13/2013 12:14 PM  Derek Mound, M.Ed, RD, LDN, CDE 09/14/2013 11:22 AM

## 2013-09-14 LAB — HCV RNA QUANT RFLX ULTRA OR GENOTYP
HCV Quantitative Log: 5.11 {Log} — ABNORMAL HIGH (ref <1.18)
HCV Quantitative: 128977 IU/mL — ABNORMAL HIGH (ref <15)

## 2013-09-15 ENCOUNTER — Encounter (HOSPITAL_COMMUNITY): Payer: No Typology Code available for payment source

## 2013-09-15 ENCOUNTER — Encounter (HOSPITAL_COMMUNITY)
Admission: RE | Admit: 2013-09-15 | Discharge: 2013-09-15 | Disposition: A | Discharge status: Home / Self Care | Payer: No Typology Code available for payment source | Source: Ambulatory Visit | Attending: Internal Medicine | Admitting: Internal Medicine

## 2013-09-15 DIAGNOSIS — Z5189 Encounter for other specified aftercare: Secondary | ICD-10-CM | POA: Insufficient documentation

## 2013-09-15 DIAGNOSIS — I214 Non-ST elevation (NSTEMI) myocardial infarction: Secondary | ICD-10-CM | POA: Insufficient documentation

## 2013-09-15 DIAGNOSIS — Z9861 Coronary angioplasty status: Secondary | ICD-10-CM | POA: Insufficient documentation

## 2013-09-15 LAB — HEPATITIS C GENOTYPE

## 2013-09-18 ENCOUNTER — Encounter (HOSPITAL_COMMUNITY)
Admission: RE | Admit: 2013-09-18 | Discharge: 2013-09-18 | Disposition: A | Discharge status: Home / Self Care | Payer: No Typology Code available for payment source | Source: Ambulatory Visit | Attending: Internal Medicine | Admitting: Internal Medicine

## 2013-09-18 ENCOUNTER — Other Ambulatory Visit: Payer: Self-pay | Admitting: Pharmacist Clinician (PhC)/ Clinical Pharmacy Specialist

## 2013-09-18 ENCOUNTER — Encounter (HOSPITAL_COMMUNITY): Payer: No Typology Code available for payment source

## 2013-09-18 NOTE — Telephone Encounter (Signed)
Rx refill sent to patient pharmacy   

## 2013-09-20 ENCOUNTER — Encounter (HOSPITAL_COMMUNITY)
Admission: RE | Admit: 2013-09-20 | Discharge: 2013-09-20 | Disposition: A | Discharge status: Home / Self Care | Payer: No Typology Code available for payment source | Source: Ambulatory Visit | Attending: Internal Medicine | Admitting: Internal Medicine

## 2013-09-20 ENCOUNTER — Encounter (HOSPITAL_COMMUNITY): Payer: No Typology Code available for payment source

## 2013-09-22 ENCOUNTER — Encounter (HOSPITAL_COMMUNITY)
Admission: RE | Admit: 2013-09-22 | Discharge: 2013-09-22 | Disposition: A | Discharge status: Home / Self Care | Payer: No Typology Code available for payment source | Source: Ambulatory Visit | Attending: Internal Medicine | Admitting: Internal Medicine

## 2013-09-22 ENCOUNTER — Encounter (HOSPITAL_COMMUNITY): Payer: No Typology Code available for payment source

## 2013-09-25 ENCOUNTER — Encounter (HOSPITAL_COMMUNITY): Payer: No Typology Code available for payment source

## 2013-09-25 ENCOUNTER — Encounter (HOSPITAL_COMMUNITY)
Admission: RE | Admit: 2013-09-25 | Discharge: 2013-09-25 | Disposition: A | Discharge status: Home / Self Care | Payer: No Typology Code available for payment source | Source: Ambulatory Visit | Attending: Internal Medicine | Admitting: Internal Medicine

## 2013-09-27 ENCOUNTER — Encounter (HOSPITAL_COMMUNITY)
Admission: RE | Admit: 2013-09-27 | Discharge: 2013-09-27 | Disposition: A | Discharge status: Home / Self Care | Payer: No Typology Code available for payment source | Source: Ambulatory Visit | Attending: Internal Medicine | Admitting: Internal Medicine

## 2013-09-27 ENCOUNTER — Encounter (HOSPITAL_COMMUNITY): Payer: No Typology Code available for payment source

## 2013-09-29 ENCOUNTER — Encounter (HOSPITAL_COMMUNITY)
Admission: RE | Admit: 2013-09-29 | Discharge: 2013-09-29 | Disposition: A | Discharge status: Home / Self Care | Payer: No Typology Code available for payment source | Source: Ambulatory Visit | Attending: Internal Medicine | Admitting: Internal Medicine

## 2013-09-29 ENCOUNTER — Encounter (HOSPITAL_COMMUNITY): Payer: No Typology Code available for payment source

## 2013-10-02 ENCOUNTER — Encounter (HOSPITAL_COMMUNITY)
Admission: RE | Admit: 2013-10-02 | Discharge: 2013-10-02 | Disposition: A | Discharge status: Home / Self Care | Payer: No Typology Code available for payment source | Source: Ambulatory Visit | Attending: Internal Medicine | Admitting: Internal Medicine

## 2013-10-02 ENCOUNTER — Encounter (HOSPITAL_COMMUNITY): Payer: No Typology Code available for payment source

## 2013-10-04 ENCOUNTER — Encounter (HOSPITAL_COMMUNITY)
Admission: RE | Admit: 2013-10-04 | Discharge: 2013-10-04 | Disposition: A | Discharge status: Home / Self Care | Payer: No Typology Code available for payment source | Source: Ambulatory Visit | Attending: Internal Medicine | Admitting: Internal Medicine

## 2013-10-04 ENCOUNTER — Encounter (HOSPITAL_COMMUNITY): Payer: No Typology Code available for payment source

## 2013-10-06 ENCOUNTER — Encounter (HOSPITAL_COMMUNITY): Payer: No Typology Code available for payment source

## 2013-10-09 ENCOUNTER — Encounter (HOSPITAL_COMMUNITY): Payer: No Typology Code available for payment source

## 2013-10-11 ENCOUNTER — Encounter (HOSPITAL_COMMUNITY)
Admission: RE | Admit: 2013-10-11 | Discharge: 2013-10-11 | Disposition: A | Discharge status: Home / Self Care | Payer: No Typology Code available for payment source | Source: Ambulatory Visit | Attending: Internal Medicine | Admitting: Internal Medicine

## 2013-10-11 ENCOUNTER — Encounter (HOSPITAL_COMMUNITY): Payer: No Typology Code available for payment source

## 2013-10-13 ENCOUNTER — Encounter (HOSPITAL_COMMUNITY)
Admission: RE | Admit: 2013-10-13 | Discharge: 2013-10-13 | Disposition: A | Discharge status: Home / Self Care | Payer: No Typology Code available for payment source | Source: Ambulatory Visit | Attending: Internal Medicine | Admitting: Internal Medicine

## 2013-10-13 ENCOUNTER — Encounter (HOSPITAL_COMMUNITY): Payer: No Typology Code available for payment source

## 2013-10-16 ENCOUNTER — Encounter (HOSPITAL_COMMUNITY): Payer: No Typology Code available for payment source

## 2013-10-16 ENCOUNTER — Encounter (HOSPITAL_COMMUNITY)
Admission: RE | Admit: 2013-10-16 | Discharge: 2013-10-16 | Disposition: A | Discharge status: Home / Self Care | Payer: No Typology Code available for payment source | Source: Ambulatory Visit | Attending: Internal Medicine | Admitting: Internal Medicine

## 2013-10-16 DIAGNOSIS — I214 Non-ST elevation (NSTEMI) myocardial infarction: Secondary | ICD-10-CM | POA: Insufficient documentation

## 2013-10-16 DIAGNOSIS — Z5189 Encounter for other specified aftercare: Secondary | ICD-10-CM | POA: Insufficient documentation

## 2013-10-16 DIAGNOSIS — Z9861 Coronary angioplasty status: Secondary | ICD-10-CM | POA: Insufficient documentation

## 2013-10-18 ENCOUNTER — Encounter (HOSPITAL_COMMUNITY)
Admission: RE | Admit: 2013-10-18 | Discharge: 2013-10-18 | Disposition: A | Discharge status: Home / Self Care | Payer: No Typology Code available for payment source | Source: Ambulatory Visit | Attending: Internal Medicine | Admitting: Internal Medicine

## 2013-10-18 ENCOUNTER — Encounter (HOSPITAL_COMMUNITY): Payer: No Typology Code available for payment source

## 2013-10-20 ENCOUNTER — Encounter (HOSPITAL_COMMUNITY)
Admission: RE | Admit: 2013-10-20 | Discharge: 2013-10-20 | Disposition: A | Discharge status: Home / Self Care | Payer: No Typology Code available for payment source | Source: Ambulatory Visit | Attending: Internal Medicine | Admitting: Internal Medicine

## 2013-10-20 ENCOUNTER — Encounter (HOSPITAL_COMMUNITY): Payer: No Typology Code available for payment source

## 2013-10-23 ENCOUNTER — Encounter (HOSPITAL_COMMUNITY)
Admission: RE | Admit: 2013-10-23 | Discharge: 2013-10-23 | Disposition: A | Discharge status: Home / Self Care | Payer: No Typology Code available for payment source | Source: Ambulatory Visit | Attending: Internal Medicine | Admitting: Internal Medicine

## 2013-10-23 ENCOUNTER — Encounter (HOSPITAL_COMMUNITY): Payer: No Typology Code available for payment source

## 2013-10-23 NOTE — Progress Notes (Signed)
Nutrition Note Spoke with pt. Pt reports his wt is down to 213 lb (96.8 kg). Pt wt 10/20/13 216.9 lb (98.6 kg). Pt wt 10/20/13 is down 2.2 kg over the past month. Rate of wt loss appropriate. Pt wants to know "what I should weigh." Ideal body weight discussed. This Probation officer and pt agreed that a goal wt of 200-205 lb is reasonable and achievable for pt. Pt expressed understanding of the information reviewed. Continue client-centered nutrition education by RD as part of interdisciplinary care.  Monitor and evaluate progress toward nutrition goal with team.  Derek Mound, M.Ed, RD, LDN, CDE 10/23/2013 1:24 PM

## 2013-10-25 ENCOUNTER — Encounter (HOSPITAL_COMMUNITY)
Admission: RE | Admit: 2013-10-25 | Discharge: 2013-10-25 | Disposition: A | Discharge status: Home / Self Care | Payer: No Typology Code available for payment source | Source: Ambulatory Visit | Attending: Internal Medicine | Admitting: Internal Medicine

## 2013-10-25 ENCOUNTER — Encounter (HOSPITAL_COMMUNITY): Payer: No Typology Code available for payment source

## 2013-10-25 ENCOUNTER — Other Ambulatory Visit: Payer: Self-pay | Admitting: *Deleted

## 2013-10-25 MED ORDER — ATORVASTATIN CALCIUM 10 MG PO TABS: 10.0000 mg | ORAL_TABLET | Freq: Every day | ORAL | Status: DC

## 2013-10-25 MED ORDER — METOPROLOL TARTRATE 25 MG PO TABS: 12.5000 mg | ORAL_TABLET | Freq: Two times a day (BID) | ORAL | Status: DC

## 2013-10-25 MED ORDER — TICAGRELOR 90 MG PO TABS: 90.0000 mg | ORAL_TABLET | Freq: Two times a day (BID) | ORAL | Status: DC

## 2013-10-25 NOTE — Telephone Encounter (Signed)
Rx was sent to pharmacy electronically. 

## 2013-10-27 ENCOUNTER — Encounter (HOSPITAL_COMMUNITY): Payer: No Typology Code available for payment source

## 2013-10-27 ENCOUNTER — Encounter (HOSPITAL_COMMUNITY)
Admission: RE | Admit: 2013-10-27 | Discharge: 2013-10-27 | Disposition: A | Discharge status: Home / Self Care | Payer: No Typology Code available for payment source | Source: Ambulatory Visit | Attending: Internal Medicine | Admitting: Internal Medicine

## 2013-10-29 ENCOUNTER — Other Ambulatory Visit: Payer: Self-pay | Admitting: Physician Assistant

## 2013-10-30 ENCOUNTER — Encounter (HOSPITAL_COMMUNITY)
Admission: RE | Admit: 2013-10-30 | Discharge: 2013-10-30 | Disposition: A | Discharge status: Home / Self Care | Payer: No Typology Code available for payment source | Source: Ambulatory Visit | Attending: Internal Medicine | Admitting: Internal Medicine

## 2013-10-30 ENCOUNTER — Encounter (HOSPITAL_COMMUNITY): Payer: No Typology Code available for payment source

## 2013-11-01 ENCOUNTER — Encounter (HOSPITAL_COMMUNITY)
Admission: RE | Admit: 2013-11-01 | Discharge: 2013-11-01 | Disposition: A | Discharge status: Home / Self Care | Payer: No Typology Code available for payment source | Source: Ambulatory Visit | Attending: Internal Medicine | Admitting: Internal Medicine

## 2013-11-01 ENCOUNTER — Encounter (HOSPITAL_COMMUNITY): Payer: No Typology Code available for payment source

## 2013-11-03 ENCOUNTER — Encounter (HOSPITAL_COMMUNITY)
Admission: RE | Admit: 2013-11-03 | Discharge: 2013-11-03 | Disposition: A | Discharge status: Home / Self Care | Payer: No Typology Code available for payment source | Source: Ambulatory Visit | Attending: Internal Medicine | Admitting: Internal Medicine

## 2013-11-03 ENCOUNTER — Encounter (HOSPITAL_COMMUNITY): Payer: No Typology Code available for payment source

## 2013-11-04 ENCOUNTER — Other Ambulatory Visit: Payer: Self-pay | Admitting: Pharmacist Clinician (PhC)/ Clinical Pharmacy Specialist

## 2013-11-06 ENCOUNTER — Encounter (HOSPITAL_COMMUNITY): Payer: No Typology Code available for payment source

## 2013-11-06 NOTE — Telephone Encounter (Signed)
Rx was sent to pharmacy electronically. 

## 2013-11-08 ENCOUNTER — Encounter (HOSPITAL_COMMUNITY): Payer: No Typology Code available for payment source

## 2013-11-09 ENCOUNTER — Telehealth (HOSPITAL_COMMUNITY): Payer: Self-pay | Admitting: *Deleted

## 2013-11-09 NOTE — Telephone Encounter (Signed)
Called pt to inquire regarding absences.  Message left to please contact rehab staff.  Contact information provided.

## 2013-11-10 ENCOUNTER — Encounter (HOSPITAL_COMMUNITY): Admission: RE | Admit: 2013-11-10 | Payer: No Typology Code available for payment source | Source: Ambulatory Visit

## 2013-11-10 ENCOUNTER — Encounter (HOSPITAL_COMMUNITY): Payer: No Typology Code available for payment source

## 2013-11-13 ENCOUNTER — Ambulatory Visit (INDEPENDENT_AMBULATORY_CARE_PROVIDER_SITE_OTHER): Payer: No Typology Code available for payment source | Admitting: Physician Assistant

## 2013-11-13 ENCOUNTER — Encounter (HOSPITAL_COMMUNITY)
Admission: RE | Admit: 2013-11-13 | Discharge: 2013-11-13 | Disposition: A | Discharge status: Home / Self Care | Payer: No Typology Code available for payment source | Source: Ambulatory Visit | Attending: Internal Medicine | Admitting: Internal Medicine

## 2013-11-13 ENCOUNTER — Encounter (HOSPITAL_COMMUNITY): Payer: No Typology Code available for payment source

## 2013-11-13 VITALS — BP 122/78 | HR 60 | Temp 97.7°F | Resp 16 | Ht 74.5 in | Wt 213.6 lb

## 2013-11-13 DIAGNOSIS — R05 Cough: Secondary | ICD-10-CM

## 2013-11-13 DIAGNOSIS — J22 Unspecified acute lower respiratory infection: Secondary | ICD-10-CM

## 2013-11-13 DIAGNOSIS — R059 Cough, unspecified: Secondary | ICD-10-CM

## 2013-11-13 DIAGNOSIS — J988 Other specified respiratory disorders: Secondary | ICD-10-CM

## 2013-11-13 MED ORDER — AZITHROMYCIN 250 MG PO TABS: ORAL_TABLET | ORAL | Status: DC

## 2013-11-13 MED ORDER — HYDROCODONE-HOMATROPINE 5-1.5 MG/5ML PO SYRP: 5.0000 mL | ORAL_SOLUTION | Freq: Three times a day (TID) | ORAL | Status: DC | PRN

## 2013-11-13 NOTE — Progress Notes (Signed)
Subjective:      Patient ID: Adam Peters is a 63 y.o. male.  Chief Complaint: HPI 63 year old male presents for evaluation of 2 week history of coughing. Admits to a tickle in his throat with slight mucous production and cough.  Admits to a "tickle" in his throat and scratchy, irritated feeling. Does have a hx of allergies and takes Flonase consistently.  Not on any antihistamines currently.  Denies fever, chills, nausea, vomiting, chest pain, SOB, wheezing, otalgia, or sinus pain.   Had NSTEMI in 07/2013 - in cardiac rehab. Doing well. Lost about 15 pounds    Review of Systems  Constitution: Negative for fever.  HENT: Positive for congestion. Negative for ear pain and sore throat.   Cardiovascular: Negative for chest pain.  Gastrointestinal: Negative for nausea and vomiting.      Objective:    Physical Exam  Constitutional: He appears healthy. No distress.  HENT: Tympanic membranes normal.  Nose: Nose normal.  Mouth/Throat: Oropharynx is clear.  Eyes: Conjunctivae are normal.  Neck: Normal range of motion. Neck supple.  Pulmonary/Chest: Effort normal and breath sounds normal. He has no wheezes. He has no rales.  Musculoskeletal: Normal range of motion.  Neurological: He is alert and oriented to person, place, and time. He exhibits altered mental status.  Skin: Skin is warm.        Assessment and Plan:       Cough - Plan: HYDROcodone-homatropine (HYCODAN) 5-1.5 MG/5ML syrup  Lower respiratory infection (e.g., bronchitis, pneumonia, pneumonitis, pulmonitis) - Plan: azithromycin (ZITHROMAX) 250 MG tablet  Due to length of illness, will treat with Zpack as directed Hycodan q8hours prn cough. Push fluids Follow up if symptoms worsening or fail to improve.

## 2013-11-15 ENCOUNTER — Encounter (HOSPITAL_COMMUNITY)
Admission: RE | Admit: 2013-11-15 | Discharge: 2013-11-15 | Disposition: A | Discharge status: Home / Self Care | Payer: No Typology Code available for payment source | Source: Ambulatory Visit | Attending: Internal Medicine | Admitting: Internal Medicine

## 2013-11-15 ENCOUNTER — Encounter (HOSPITAL_COMMUNITY): Payer: No Typology Code available for payment source

## 2013-11-15 DIAGNOSIS — Z9861 Coronary angioplasty status: Secondary | ICD-10-CM | POA: Insufficient documentation

## 2013-11-15 DIAGNOSIS — I214 Non-ST elevation (NSTEMI) myocardial infarction: Secondary | ICD-10-CM | POA: Insufficient documentation

## 2013-11-15 DIAGNOSIS — Z5189 Encounter for other specified aftercare: Secondary | ICD-10-CM | POA: Insufficient documentation

## 2013-11-15 NOTE — Progress Notes (Signed)
Patient weight was noticed to be up 2.1 kg from Monday. Adam Peters denied shortness of breath but told me   he been seen by his primary care provider for an upper respiratory infection and is taking a Zpack. Upon assessment lung fields clear. No peripheral edema noted.  Blood pressure 122/64.  Oxygen saturation 99% on room air.  Cecilie Kicks FNP-C called and notified about weight gain. Patient also had some nonsustained PAC's with a compensatory pause. Heart rate noted at 47 while sitting. Sinus [name].  Patient remained asymptomatic. Cecilie Kicks FNP-c also notified about PAC's no new order received. Adam Peters exercised without complaints or difficulty. Will fax exercise flow sheets to Dr. Lysbeth Penner office for review with today's ECG tracings.

## 2013-11-17 ENCOUNTER — Encounter (HOSPITAL_COMMUNITY): Payer: No Typology Code available for payment source

## 2013-11-17 ENCOUNTER — Telehealth: Payer: Self-pay

## 2013-11-17 NOTE — Telephone Encounter (Signed)
Pt is not happy with the provider that he has now handling his hep c and would like to talk with someone about a referral to a new provider

## 2013-11-19 ENCOUNTER — Other Ambulatory Visit (HOSPITAL_COMMUNITY): Payer: Self-pay | Admitting: Nurse Practitioner

## 2013-11-19 NOTE — Telephone Encounter (Signed)
He lists Dr Elder Cyphers as his PCP, please advise.

## 2013-11-20 ENCOUNTER — Encounter (HOSPITAL_COMMUNITY)
Admission: RE | Admit: 2013-11-20 | Discharge: 2013-11-20 | Disposition: A | Discharge status: Home / Self Care | Payer: No Typology Code available for payment source | Source: Ambulatory Visit | Attending: Internal Medicine | Admitting: Internal Medicine

## 2013-11-20 ENCOUNTER — Encounter (HOSPITAL_COMMUNITY): Payer: No Typology Code available for payment source

## 2013-11-20 DIAGNOSIS — Z5189 Encounter for other specified aftercare: Secondary | ICD-10-CM | POA: Diagnosis not present

## 2013-11-20 NOTE — Telephone Encounter (Signed)
Rx was sent to pharmacy electronically. 

## 2013-11-22 ENCOUNTER — Encounter (HOSPITAL_COMMUNITY)
Admission: RE | Admit: 2013-11-22 | Discharge: 2013-11-22 | Disposition: A | Discharge status: Home / Self Care | Payer: No Typology Code available for payment source | Source: Ambulatory Visit | Attending: Internal Medicine | Admitting: Internal Medicine

## 2013-11-22 ENCOUNTER — Encounter (HOSPITAL_COMMUNITY): Payer: No Typology Code available for payment source

## 2013-11-22 DIAGNOSIS — Z5189 Encounter for other specified aftercare: Secondary | ICD-10-CM | POA: Diagnosis not present

## 2013-11-24 ENCOUNTER — Encounter (HOSPITAL_COMMUNITY): Payer: No Typology Code available for payment source

## 2013-11-24 ENCOUNTER — Encounter (HOSPITAL_COMMUNITY)
Admission: RE | Admit: 2013-11-24 | Discharge: 2013-11-24 | Disposition: A | Discharge status: Home / Self Care | Payer: No Typology Code available for payment source | Source: Ambulatory Visit | Attending: Internal Medicine | Admitting: Internal Medicine

## 2013-11-24 DIAGNOSIS — Z5189 Encounter for other specified aftercare: Secondary | ICD-10-CM | POA: Diagnosis not present

## 2013-11-27 ENCOUNTER — Encounter (HOSPITAL_COMMUNITY)
Admission: RE | Admit: 2013-11-27 | Discharge: 2013-11-27 | Disposition: A | Discharge status: Home / Self Care | Payer: No Typology Code available for payment source | Source: Ambulatory Visit | Attending: Internal Medicine | Admitting: Internal Medicine

## 2013-11-27 ENCOUNTER — Encounter (HOSPITAL_COMMUNITY): Payer: No Typology Code available for payment source

## 2013-11-27 DIAGNOSIS — Z5189 Encounter for other specified aftercare: Secondary | ICD-10-CM | POA: Diagnosis not present

## 2013-11-29 ENCOUNTER — Encounter (HOSPITAL_COMMUNITY)
Admission: RE | Admit: 2013-11-29 | Discharge: 2013-11-29 | Disposition: A | Discharge status: Home / Self Care | Payer: No Typology Code available for payment source | Source: Ambulatory Visit | Attending: Internal Medicine | Admitting: Internal Medicine

## 2013-11-29 ENCOUNTER — Encounter (HOSPITAL_COMMUNITY): Payer: No Typology Code available for payment source

## 2013-11-29 DIAGNOSIS — Z5189 Encounter for other specified aftercare: Secondary | ICD-10-CM | POA: Diagnosis not present

## 2013-12-01 ENCOUNTER — Encounter (HOSPITAL_COMMUNITY)
Admission: RE | Admit: 2013-12-01 | Discharge: 2013-12-01 | Disposition: A | Discharge status: Home / Self Care | Payer: No Typology Code available for payment source | Source: Ambulatory Visit | Attending: Internal Medicine | Admitting: Internal Medicine

## 2013-12-01 ENCOUNTER — Encounter (HOSPITAL_COMMUNITY): Payer: No Typology Code available for payment source

## 2013-12-01 DIAGNOSIS — Z5189 Encounter for other specified aftercare: Secondary | ICD-10-CM | POA: Diagnosis not present

## 2013-12-01 NOTE — Progress Notes (Signed)
Pt completed 36 exercise sessions in the phase II cardiac rehab program.  Pt made excellent progress with both exercise and education classes attended. Pt with increase in the MET level and increase in questions answered correctly post exercise knowledge test. Pt plans to continue home exercise at the [facility] at [place] along with his wife.  Repeat PHQ2 score 0.  Pt has met his goals and feels very postive about his experience. Cherre Huger, BSN

## 2013-12-04 ENCOUNTER — Other Ambulatory Visit: Payer: Self-pay | Admitting: Pharmacist Clinician (PhC)/ Clinical Pharmacy Specialist

## 2014-01-15 ENCOUNTER — Other Ambulatory Visit (HOSPITAL_COMMUNITY): Payer: Self-pay | Admitting: Nurse Practitioner

## 2014-01-16 NOTE — Telephone Encounter (Signed)
Dr [name] patient 

## 2014-02-26 ENCOUNTER — Other Ambulatory Visit: Payer: Self-pay | Admitting: Pharmacist Clinician (PhC)/ Clinical Pharmacy Specialist

## 2014-02-26 NOTE — Telephone Encounter (Signed)
Refilled #30 tablets with 10 refills 11/06/13

## 2014-03-14 ENCOUNTER — Telehealth: Payer: Self-pay

## 2014-03-14 DIAGNOSIS — Z1211 Encounter for screening for malignant neoplasm of colon: Secondary | ICD-10-CM

## 2014-03-14 NOTE — Telephone Encounter (Signed)
Pt is needing to talk with someone about being referred for a colonoscopy

## 2014-03-14 NOTE — Telephone Encounter (Signed)
Pt[facility] wants pt to have colonscopy, but they only do them in [place]. Pt wants to know if we can refer him to have one done in [place] since this is where he lives. Please advise. Thanks

## 2014-03-15 NOTE — Telephone Encounter (Signed)
Is this a screening colonoscopy or a diagnostic?

## 2014-03-15 NOTE — Telephone Encounter (Signed)
Spoke to pt- he needs a routine colonoscopy. He has not had one in about 12 years.

## 2014-03-16 ENCOUNTER — Telehealth: Payer: Self-pay | Admitting: Gastroenterology

## 2014-03-20 ENCOUNTER — Telehealth: Payer: Self-pay

## 2014-03-20 NOTE — Telephone Encounter (Signed)
Patient left voicemail on medical records voicemail stating that he had a colonoscopy in 2008 and the [facility] ([place] wants him to get another one. He wants to know if this is necessary and if so can he have it scheduled in [place]. Please call back at [phone]. His paper chart is [id number]. If you need chart pulled, please let medical records know. Thanks!

## 2014-03-20 NOTE — Telephone Encounter (Signed)
Per the last colon report the pt was referred back to Dr Lajoyce Corners for further evaluation, the pt has been given the information.

## 2014-03-21 ENCOUNTER — Telehealth: Payer: Self-pay

## 2014-03-21 ENCOUNTER — Telehealth: Payer: Self-pay | Admitting: Gastroenterology

## 2014-03-21 NOTE — Telephone Encounter (Signed)
The [facility] will be opening in [place] in  A couple of weeks. [place] can arrive at 2:30 to complete registration process.

## 2014-03-21 NOTE — Telephone Encounter (Signed)
Pt has been in contact with [facility] and has an appt with Dr. Rolla Plate for next month.

## 2014-03-21 NOTE — Telephone Encounter (Signed)
Patient came to 28 and presented with lab results that he had done at the [facility] hospital. Patient stated that he wants [place] to have a copy of this. I am giving this copy to our medical records department to give to the appropriate person. Patient also stated that the [facility] has scheduled an appointment for him to have a colonoscopy done in [place]. Patient does not want to go that far; can our referrals department schedule something in [place]?   [phone]

## 2014-03-21 NOTE — Telephone Encounter (Signed)
Just called Adam Peters; he had a referral 03/15/14 put in. They have his referral and notified patient when Dr. Ardis Hughs reviews his chart and when he is do for another colonoscopy. They will contact patient to schedule. They spoke to patient today at 1pm and told him this. Spoke to patient and he is aware now and voice understanding. Told him to give Dr. Ardis Hughs more time to review referral.

## 2014-03-22 NOTE — Telephone Encounter (Signed)
Really note sure if he is due; the last procedure report I see in EPIC is from 2002 (Dr. Lajoyce Corners colonoscopy, found small polyp, was a HP).  If patient is sure he had one in 2008 then we need to track that down to see what was found.  Please contact him about that ? 2008 colonoscopy.  If he did not have a colonoscopy since 2005 then he is due for routine cancer screening and needs one now.

## 2014-03-22 NOTE — Telephone Encounter (Signed)
Patient is sure he had a colonoscopy here at [facility] in 2008.

## 2014-03-26 NOTE — Telephone Encounter (Signed)
Paperwork for this was in my box, but I see that it has been taken care of.  Good work, team.

## 2014-04-25 ENCOUNTER — Telehealth: Payer: Self-pay

## 2014-04-25 ENCOUNTER — Telehealth: Payer: Self-pay | Admitting: Gastroenterology

## 2014-04-25 ENCOUNTER — Ambulatory Visit (INDEPENDENT_AMBULATORY_CARE_PROVIDER_SITE_OTHER): Payer: No Typology Code available for payment source | Admitting: Internal Medicine

## 2014-04-25 ENCOUNTER — Encounter: Payer: Self-pay | Admitting: Internal Medicine

## 2014-04-25 VITALS — BP 130/70 | HR 61 | Ht 74.0 in | Wt 214.4 lb

## 2014-04-25 DIAGNOSIS — I2583 Coronary atherosclerosis due to lipid rich plaque: Secondary | ICD-10-CM

## 2014-04-25 DIAGNOSIS — S46002A Unspecified injury of muscle(s) and tendon(s) of the rotator cuff of left shoulder, initial encounter: Secondary | ICD-10-CM

## 2014-04-25 DIAGNOSIS — I10 Essential (primary) hypertension: Secondary | ICD-10-CM

## 2014-04-25 DIAGNOSIS — I214 Non-ST elevation (NSTEMI) myocardial infarction: Secondary | ICD-10-CM

## 2014-04-25 DIAGNOSIS — E785 Hyperlipidemia, unspecified: Secondary | ICD-10-CM

## 2014-04-25 DIAGNOSIS — I2510 Atherosclerotic heart disease of native coronary artery without angina pectoris: Secondary | ICD-10-CM

## 2014-04-25 NOTE — Telephone Encounter (Signed)
Pt aware and recall in EPIC

## 2014-04-25 NOTE — Patient Instructions (Signed)
You have been referred to Dr. Onnie Graham ([place])  Your physician wants you to follow-up in: March 2016. You will receive a reminder letter in the mail two months in advance. If you don't receive a letter, please call our office to schedule the follow-up appointment.

## 2014-04-25 NOTE — Progress Notes (Signed)
OFFICE NOTE  Chief Complaint:  Left shoulder pain  Primary Care Physician: Adam Portela, MD  HPI:  Adam Peters is a 63 year old gentleman who I have been following for history of coronary disease status post PCI of the LAD in 1997 (with a Palmaz-Schatz stent). He has history of smoking in the past but has discontinued that. Echo in 2010 showed a low normal EF and he had a stress test which was negative. Recently, this past fall he was contemplating treatment for hepatitis C and underwent a stress test which was negative for ischemia. At that time, EF was 51%. He, unfortunately, did not undergo treatment at that time; however, has since been set up to see Dr. Patsy Baltimore at [facility] by you who was contemplating treatment starting in December. He is also contemplating retirement at the end of January of next year.   When I last saw him about 6 months ago he was doing fairly well. I understand he was seen just a few days ago in urgent care. He had been doing some new exercise routine and had noted tightness after exercise in his chest. The symptoms were certainly worse with exertion and ultimately did relieve themselves mostly at rest. Since that time he's had worsening shortness of breath and discomfort in his chest especially when walking up stairs or doing most activities. He appears visibly disturbed by this today. He did have a chest x-ray performed which showed no acute disease. An EKG performed at his primary care provider's office showed sinus bradycardia with no ischemic changes. A troponin was checked and was positive at 0.21.  Adam Peters was subsequently referred for cardiac catheterization and found to have acute right coronary artery occlusion. He underwent placement of a Promus Premier 3.5 x 16 mm drug-eluting stent to the mid right coronary artery. Since then he's had no significant anginal symptoms. He's been active and exercising. Unfortunately he has not been able to get  treatment for his hepatitis C due to a shortage of medications. He has however recently been having problems in his left shoulder and left upper chest. He was concerned about this being ischemia. The symptoms are worse when swimming particularly and he feels pain in his left shoulder when outstretching his arm.  PMHx:  Past Medical History  Diagnosis Date  . Arthritis   . DDD (degenerative disc disease), cervical 01/12/2012  . Coronary artery disease     s/p PCI to LAD (1997) - Dr. Loni Muse. [place]  . History of tobacco use   . Hepatitis C   . CAD (coronary artery disease)     a. 1997 PCI/BMS to distal LAD (J&J PS BMS);  b. 03/2011 Neg MV;  c. 07/2013 NSTEMI/PCI: LM 20d, LAD 45m, 50d isr, LCX nl, OM1/2 nl, RCA dom 95-66m (3.5x16 Promus premier DES), EF 55%, mild basal inf HK.  Marland Kitchen Hypertension   . Schizophrenia 1978  . Hyperlipidemia     Past Surgical History  Procedure Laterality Date  . Appendectomy  1965  . Coronary angioplasty with stent placement  06/09/1995    prox LAD stenting 80% to 0% (Dr. Rockne Menghini) - repeat cath in 11/1999 showed no significant coronary disease & normal systolic function (Dr. Gerrie Nordmann)  . Hernia repair  1994  . Knife wound repair  1969  . Transthoracic echocardiogram  11/13/2008    EF 50-55%, normal LV systolic function; RA mildly dilated; trace MR/TR/PR  . Coronary stent placement  08/02/2013  RCA   DES         DR COOPER  . Cardiac catheterization  2001    patent LAD stent (Dr Georges Lynch)    FAMHx:  Family History  Problem Relation Age of Onset  . Asthma Mother   . Crohn's disease Mother   . Hepatitis C Brother   . Dementia Father     SOCHx:   reports that he quit smoking about 20 years ago. His smoking use included Cigarettes. He smoked 1.00 pack per day. He has never used smokeless tobacco. He reports that he drinks about 1.8 oz of alcohol per week. He reports that he does not use illicit drugs.  ALLERGIES:  Allergies  Allergen Reactions  . Amoxicillin  Diarrhea    GI upset.    ROS: A comprehensive review of systems was negative except for: Musculoskeletal: positive for myalgias  HOME MEDS: Current Outpatient Prescriptions  Medication Sig Dispense Refill  . amLODipine (NORVASC) 5 MG tablet TAKE 1 TABLET (5 MG TOTAL) BY MOUTH DAILY. 30 tablet 10  . aspirin 81 MG tablet Take 81 mg by mouth daily.     Marland Kitchen atorvastatin (LIPITOR) 10 MG tablet Take 1 tablet (10 mg total) by mouth daily. 90 tablet 2  . dorzolamide-timolol (COSOPT) 22.3-6.8 MG/ML ophthalmic solution Place 1 drop into both eyes 2 (two) times daily.    . fluticasone (FLONASE) 50 MCG/ACT nasal spray INHALE 2 SPRAYS IN EACH NOSTRIL EVERY DAY 16 g 7  . irbesartan (AVAPRO) 300 MG tablet TAKE 1 TABLET BY MOUTH EVERY DAY 30 tablet 10  . metoprolol tartrate (LOPRESSOR) 25 MG tablet Take 0.5 tablets (12.5 mg total) by mouth 2 (two) times daily. 90 tablet 2  . NITROSTAT 0.4 MG SL tablet PLACE 1 TABLET (0.4 MG TOTAL) UNDER THE TONGUE EVERY 5 (FIVE) MINUTES AS NEEDED FOR CHEST PAIN. 25 tablet 3  . omeprazole (PRILOSEC) 20 MG capsule Take 1 capsule (20 mg total) by mouth daily. 90 capsule 3  . ticagrelor (BRILINTA) 90 MG TABS tablet Take 1 tablet (90 mg total) by mouth 2 (two) times daily. 180 tablet 2  . triamcinolone (KENALOG) 0.025 % cream Apply 1 application topically 2 (two) times daily as needed.     No current facility-administered medications for this visit.    LABS/IMAGING: No results found for this or any previous visit (from the past 48 hour(s)). No results found.  VITALS: BP 130/70 mmHg  Pulse 61  Ht 6\' 2"  (1.88 m)  Wt 214 lb 6.4 oz (97.251 kg)  BMI 27.52 kg/m2  EXAM: General appearance: alert and no distress Neck: no carotid bruit and no JVD Lungs: clear to auscultation bilaterally Heart: regular rate and rhythm, S1, S2 normal, no murmur, click, rub or gallop Abdomen: soft, non-tender; bowel sounds normal; no masses,  no organomegaly Extremities: there is left rotator  cuff impingment, pain and weakness with extension and external rotation Pulses: 2+ and symmetric Skin: Skin color, texture, turgor normal. No rashes or lesions Neurologic: Grossly normal Psych: Mood, affect normal  EKG: Normal sinus rhythm at 61  ASSESSMENT: 1. Left rotator cuff injury 2. Coronary artery disease status post PCI in 1997 (PS Stent to proximal LAD) and 2015 (Promus premier DES to RCA) 3. Hepatitis C-untreated 4. Dyslipidemia-not on a statin due to hepatitis  PLAN: 1.   Adam Peters is fortunately not describing angina. He has pain in his left shoulder which is worse with movement, external rotation and extension. There is weakness and this may  be consistent with rotator cuff injury. Hopefully this represents rotator cuff strain, however I will refer him to orthopedics and he may need an MRI to rule out a tear. From a coronary standpoint he's not describing any anginal symptoms. He is on appropriate medications. We cannot interrupt dual antiplatelet therapy until March 2016. He understands that if he were to require surgery, it would not be until that time. I've encouraged him to continue to pursue treatment of his hepatitis. Once that is treated we may want to consider statin therapy. Plan to see him back in March and we will determine if he could come off of dual antiplatelet therapy at that time.  Pixie Casino, MD, [facility] Attending Cardiologist [facility] 04/25/2014, 4:42 PM

## 2014-04-25 NOTE — Telephone Encounter (Signed)
OK, thanks         He had colonoscopy in 2003 Dr. Lajoyce Corners, small HP removed. He had colonoscopy by me 11/2006 that was normal. I recommended recall colonsocopy in 5-10 years, to be decided by Dr. Lajoyce Corners who was his primary GI at the time. Dr. Lajoyce Corners has since retired. Let him know he needs recall colonoscopy 11/2016 for routine risk screening.        Thanks            ----- Message -----     From: Barron Alvine, CMA     Sent: 04/24/2014  9:18 AM      To: Milus Banister, MD        I put this report on your desk, It is the one you were not sure about why you had it.Marland Kitchen

## 2014-04-26 ENCOUNTER — Encounter (HOSPITAL_COMMUNITY): Payer: Self-pay | Admitting: Cardiovascular Disease

## 2014-04-26 NOTE — Telephone Encounter (Signed)
The pt states he [facility] and wanted to know what he was to do about that, I advised him that he would need to contact the [place] and go through the proper channels with them.  I advised him to contact them and see what appts he needs and we would be glad to set up an appt with Dr Ardis Hughs if needed

## 2014-05-08 ENCOUNTER — Ambulatory Visit (INDEPENDENT_AMBULATORY_CARE_PROVIDER_SITE_OTHER): Payer: No Typology Code available for payment source

## 2014-05-08 ENCOUNTER — Ambulatory Visit (INDEPENDENT_AMBULATORY_CARE_PROVIDER_SITE_OTHER): Payer: No Typology Code available for payment source | Admitting: Emergency Medicine

## 2014-05-08 VITALS — BP 136/76 | HR 62 | Temp 97.6°F | Ht 74.5 in | Wt 214.2 lb

## 2014-05-08 DIAGNOSIS — J309 Allergic rhinitis, unspecified: Secondary | ICD-10-CM

## 2014-05-08 DIAGNOSIS — R05 Cough: Secondary | ICD-10-CM

## 2014-05-08 DIAGNOSIS — R059 Cough, unspecified: Secondary | ICD-10-CM

## 2014-05-08 DIAGNOSIS — R0989 Other specified symptoms and signs involving the circulatory and respiratory systems: Secondary | ICD-10-CM

## 2014-05-08 MED ORDER — AZITHROMYCIN 250 MG PO TABS: ORAL_TABLET | ORAL | Status: DC

## 2014-05-08 MED ORDER — FLUTICASONE PROPIONATE 50 MCG/ACT NA SUSP: NASAL | Status: DC

## 2014-05-08 MED ORDER — LORATADINE 10 MG PO TABS: 10.0000 mg | ORAL_TABLET | Freq: Every day | ORAL | Status: DC

## 2014-05-08 MED ORDER — DEXTROMETHORPHAN HBR 15 MG/5ML PO SYRP: 10.0000 mL | ORAL_SOLUTION | Freq: Four times a day (QID) | ORAL | Status: DC | PRN

## 2014-05-08 NOTE — Progress Notes (Signed)
Subjective:    Patient ID: Adam Peters, male    DOB: [birth year], 63 y.o.   MRN: [id number]  PCP: Kennon Portela, MD  Chief Complaint  Patient presents with  . Cough    yellow sputum  x 1 week   Patient Active Problem List   Diagnosis Date Noted  . Injury of left rotator cuff 04/25/2014  . Hypertension   . Hyperlipidemia   . Acute myocardial infarction, subendocardial infarction, initial episode of care 08/02/2013  . NSTEMI - 08/01/12 (Troponin 0.21) 08/01/2013  . Chest pain - CCS 3 08/01/2013  . History of tobacco use 07/08/2012  . Glaucoma 04/08/2012  . DDD (degenerative disc disease), cervical   . Hepatitis C 10/27/2011  . CAD- LAD BMS '97, RCA DES 08/02/13 08/17/2011   Prior to Admission medications   Medication Sig Start Date End Date Taking? Authorizing Provider  amLODipine (NORVASC) 5 MG tablet TAKE 1 TABLET (5 MG TOTAL) BY MOUTH DAILY.   Yes Pixie Casino, MD  aspirin 81 MG tablet Take 81 mg by mouth daily.    Yes Historical Provider, MD  atorvastatin (LIPITOR) 10 MG tablet Take 1 tablet (10 mg total) by mouth daily. 10/25/13  Yes Pixie Casino, MD  dorzolamide-timolol (COSOPT) 22.3-6.8 MG/ML ophthalmic solution Place 1 drop into both eyes 2 (two) times daily. 05/03/13  Yes Historical Provider, MD  fluticasone (FLONASE) 50 MCG/ACT nasal spray INHALE 2 SPRAYS IN EACH NOSTRIL EVERY DAY   Yes Heather M Marte, PA-C  irbesartan (AVAPRO) 300 MG tablet TAKE 1 TABLET BY MOUTH EVERY DAY   Yes Pixie Casino, MD  metoprolol tartrate (LOPRESSOR) 25 MG tablet Take 0.5 tablets (12.5 mg total) by mouth 2 (two) times daily. 10/25/13  Yes Pixie Casino, MD  NITROSTAT 0.4 MG SL tablet PLACE 1 TABLET (0.4 MG TOTAL) UNDER THE TONGUE EVERY 5 (FIVE) MINUTES AS NEEDED FOR CHEST PAIN. 01/16/14  Yes Pixie Casino, MD  omeprazole (PRILOSEC) 20 MG capsule Take 1 capsule (20 mg total) by mouth daily. 08/16/13  Yes Erlene Quan, PA-C  ticagrelor (BRILINTA) 90 MG TABS tablet Take  1 tablet (90 mg total) by mouth 2 (two) times daily. 10/25/13  Yes Pixie Casino, MD  triamcinolone (KENALOG) 0.025 % cream Apply 1 application topically 2 (two) times daily as needed. 07/08/12  Yes Barton Fanny, MD   Medications, allergies, past medical history, surgical history, family history, social history and problem list reviewed and updated.  HPI  36 yom with pmh cad, htn, high cholesterol, gerd, and allergic rhinitis presents with 2 wk h/o cough.  Sx started two wks ago with non-prod cough. Cough has persisted and slowly become prod with yellow/green sputum. Cough is throughout day but slightly more first thing in morning, with exertion, with talking, or while in warm shower. Feels like the cough is more due to congestion in his lungs rather than a tickle back of throat.   Denies any fever, chills, sore throat, otalgia, abd pain, n/v, diarrhea, cp, sob, syncope, palps.   Uses flonase most days for allergies. Does not use antihistamine. Has had itchy/watery eyes past [redacted] wks along with the cough. No hx asthma or wheezing.   Hx gerd taking prilosec 20 mg qd which has helped tremendously. Denies any reflux sx on prilosec.  Was seen here in 6/15 with 2 wk h/o cough, diagnosed with lrti and given azitrho and cough medication. Sx resolved with this tx. No return of sx  till 2 wks ago.    Review of Systems  No unintentional wt loss, no night sweats.     Objective:   Physical Exam  Constitutional: He is oriented to person, place, and time. He appears well-developed and well-nourished.  Non-toxic appearance. He does not have a sickly appearance. He does not appear ill. No distress.  BP 136/76 mmHg  Pulse 62  Temp(Src) 97.6 F (36.4 C) (Oral)  Ht 6' 2.5" (1.892 m)  Wt 214 lb 3.2 oz (97.16 kg)  BMI 27.14 kg/m2  SpO2 100%   HENT:  Right Ear: Tympanic membrane normal.  Left Ear: Tympanic membrane normal.  Nose: Mucosal edema present. No rhinorrhea.  Mouth/Throat: Uvula is  midline and oropharynx is clear and moist. No oropharyngeal exudate, posterior oropharyngeal edema, posterior oropharyngeal erythema or tonsillar abscesses.  Cardiovascular: Normal rate, regular rhythm and normal heart sounds.  Exam reveals no gallop.   No murmur heard. Pulmonary/Chest: Effort normal. No tachypnea. He has no decreased breath sounds. He has wheezes in the right upper field, the right middle field and the left upper field. He has rhonchi in the right upper field. He has no rales.  Lymphadenopathy:       Head (right side): No submental, no submandibular and no tonsillar adenopathy present.       Head (left side): No submental, no submandibular and no tonsillar adenopathy present.    He has no cervical adenopathy.  Neurological: He is alert and oriented to person, place, and time.  Psychiatric: He has a normal mood and affect. His speech is normal.   [facility] reading (PRIMARY) by  Dr. Ouida Sills. Findings: Normal. No acute findings.      Assessment & Plan:   57 yom with pmh cad, htn, high cholesterol, gerd, and allergic rhinitis presents with 2 wk h/o cough.  Cough - Plan: DG Chest 2 View, azithromycin (ZITHROMAX) 250 MG tablet, dextromethorphan 15 MG/5ML syrup Abnormal lung sounds - Plan: DG Chest 2 View, azithromycin (ZITHROMAX) 250 MG tablet --2 wk increasingly prod cough and abn lung sounds --azithro --could also be due to allergic rhinitis with post nasal drip, doubt gerd or asthma as cause --normal cxr, normal vitals --dextromethorphan prn every 6 hrs for cough --rtc if not improving 3-4 days  Allergic rhinitis, unspecified allergic rhinitis type - Plan: loratadine (CLARITIN) 10 MG tablet, fluticasone (FLONASE) 50 MCG/ACT nasal spray --allergic rhinitis with post-nasal drip could be cause of cough --claritin qd --flonase qd  Julieta Gutting, PA-C Physician Assistant-Certified [facility]  05/08/2014 5:53 PM

## 2014-05-08 NOTE — Patient Instructions (Addendum)
Your chest xray looked normal today but since you had some abnormal lung sounds along with this continuing cough for past couple weeks, we've prescribed azithromycin. Please take 2 pills the 1st day, then one daily for the next four days. For your allergies, please take the claritin daily and start using the flonase again every day. This will help to control the congestion and post-nasal drip that may be causing your cough.  Please take the dextromethorphan cough syrup every 4 hours as needed for the cough.  If you're not feeling better in the next 3-4 days, please return to clinic.

## 2014-05-17 ENCOUNTER — Other Ambulatory Visit: Payer: Self-pay | Admitting: *Deleted

## 2014-05-17 MED ORDER — NITROGLYCERIN 0.4 MG SL SUBL: 0.4000 mg | SUBLINGUAL_TABLET | SUBLINGUAL | Status: DC | PRN

## 2014-05-17 MED ORDER — AMLODIPINE BESYLATE 5 MG PO TABS: 5.0000 mg | ORAL_TABLET | Freq: Every day | ORAL | Status: DC

## 2014-07-05 ENCOUNTER — Other Ambulatory Visit: Payer: Self-pay | Admitting: Internal Medicine

## 2014-07-06 NOTE — Telephone Encounter (Signed)
Rx(s) sent to pharmacy electronically.  

## 2014-07-24 ENCOUNTER — Other Ambulatory Visit: Payer: Self-pay | Admitting: Cardiology

## 2014-07-25 ENCOUNTER — Encounter: Payer: Self-pay | Admitting: Internal Medicine

## 2014-07-25 ENCOUNTER — Ambulatory Visit (INDEPENDENT_AMBULATORY_CARE_PROVIDER_SITE_OTHER): Payer: No Typology Code available for payment source | Admitting: Internal Medicine

## 2014-07-25 VITALS — BP 112/64 | HR 63 | Ht 74.5 in | Wt 220.3 lb

## 2014-07-25 DIAGNOSIS — I2510 Atherosclerotic heart disease of native coronary artery without angina pectoris: Secondary | ICD-10-CM

## 2014-07-25 DIAGNOSIS — E785 Hyperlipidemia, unspecified: Secondary | ICD-10-CM

## 2014-07-25 DIAGNOSIS — I2583 Coronary atherosclerosis due to lipid rich plaque: Secondary | ICD-10-CM

## 2014-07-25 DIAGNOSIS — B182 Chronic viral hepatitis C: Secondary | ICD-10-CM | POA: Diagnosis not present

## 2014-07-25 NOTE — Progress Notes (Signed)
OFFICE NOTE  Chief Complaint:  No complaints  Primary Care Physician: Kennon Portela, MD  HPI:  Adam Peters is a 64 year old gentleman who I have been following for history of coronary disease status post PCI of the LAD in 1997 (with a Palmaz-Schatz stent). He has history of smoking in the past but has discontinued that. Echo in 2010 showed a low normal EF and he had a stress test which was negative. Recently, this past fall he was contemplating treatment for hepatitis C and underwent a stress test which was negative for ischemia. At that time, EF was 51%. He, unfortunately, did not undergo treatment at that time; however, has since been set up to see Dr. Patsy Baltimore at [facility] by you who was contemplating treatment starting in December. He is also contemplating retirement at the end of January of next year.   When I last saw him about 6 months ago he was doing fairly well. I understand he was seen just a few days ago in urgent care. He had been doing some new exercise routine and had noted tightness after exercise in his chest. The symptoms were certainly worse with exertion and ultimately did relieve themselves mostly at rest. Since that time he's had worsening shortness of breath and discomfort in his chest especially when walking up stairs or doing most activities. He appears visibly disturbed by this today. He did have a chest x-ray performed which showed no acute disease. An EKG performed at his primary care provider's office showed sinus bradycardia with no ischemic changes. A troponin was checked and was positive at 0.21.  Adam Peters was subsequently referred for cardiac catheterization and found to have acute right coronary artery occlusion. He underwent placement of a Promus Premier 3.5 x 16 mm drug-eluting stent to the mid right coronary artery. Since then he's had no significant anginal symptoms. He's been active and exercising. Unfortunately he has not been able to get  treatment for his hepatitis C due to a shortage of medications. He has however recently been having problems in his left shoulder and left upper chest. He was concerned about this being ischemia. The symptoms are worse when swimming particularly and he feels pain in his left shoulder when outstretching his arm.  Adam Peters returns today for follow-up. He is feeling quite well. He denies any chest pain or shortness of breath. He continues to be active and without complaints. He is scheduled to have treatment for his hepatitis C next month. Hopefully that can be cured. He's a wart a repeat check of his cholesterol. It's now been almost one year since his drug-eluting stent placement  PMHx:  Past Medical History  Diagnosis Date  . Arthritis   . DDD (degenerative disc disease), cervical 01/12/2012  . Coronary artery disease     s/p PCI to LAD (1997) - Dr. Loni Muse. Little  . History of tobacco use   . Hepatitis C   . CAD (coronary artery disease)     a. 1997 PCI/BMS to distal LAD (J&J PS BMS);  b. 03/2011 Neg MV;  c. 07/2013 NSTEMI/PCI: LM 20d, LAD 19m, 50d isr, LCX nl, OM1/2 nl, RCA dom 95-52m (3.5x16 Promus premier DES), EF 55%, mild basal inf HK.  Marland Kitchen Hypertension   . Schizophrenia 1978  . Hyperlipidemia   . Anemia     Past Surgical History  Procedure Laterality Date  . Appendectomy  1965  . Coronary angioplasty with stent placement  06/09/1995  prox LAD stenting 80% to 0% (Dr. Rockne Menghini) - repeat cath in 11/1999 showed no significant coronary disease & normal systolic function (Dr. Gerrie Nordmann)  . Hernia repair  1994  . Knife wound repair  1969  . Transthoracic echocardiogram  11/13/2008    EF 50-55%, normal LV systolic function; RA mildly dilated; trace MR/TR/PR  . Coronary stent placement  08/02/2013    RCA   DES         DR COOPER  . Cardiac catheterization  2001    patent LAD stent (Dr Georges Lynch)  . Left heart catheterization with coronary angiogram N/A 08/02/2013    Procedure: LEFT HEART  CATHETERIZATION WITH CORONARY ANGIOGRAM;  Surgeon: Blane Ohara, MD;  Location: [facility] CATH LAB;  Service: Cardiovascular;  Laterality: N/A;    FAMHx:  Family History  Problem Relation Age of Onset  . Asthma Mother   . Crohn's disease Mother   . Hepatitis C Brother   . Dementia Father     SOCHx:   reports that he quit smoking about 21 years ago. His smoking use included Cigarettes. He smoked 1.00 pack per day. He has never used smokeless tobacco. He reports that he drinks about 1.8 oz of alcohol per week. He reports that he does not use illicit drugs.  ALLERGIES:  Allergies  Allergen Reactions  . Amoxicillin Diarrhea    GI upset.    ROS: A comprehensive review of systems was negative.  HOME MEDS: Current Outpatient Prescriptions  Medication Sig Dispense Refill  . amLODipine (NORVASC) 5 MG tablet Take 1 tablet (5 mg total) by mouth daily. 30 tablet 10  . aspirin 81 MG tablet Take 81 mg by mouth daily.     Marland Kitchen atorvastatin (LIPITOR) 10 MG tablet Take 1 tablet (10 mg total) by mouth daily. 90 tablet 2  . dextromethorphan 15 MG/5ML syrup Take 10 mLs (30 mg total) by mouth 4 (four) times daily as needed for cough. 120 mL 0  . dorzolamide-timolol (COSOPT) 22.3-6.8 MG/ML ophthalmic solution Place 1 drop into both eyes 2 (two) times daily.    . fluticasone (FLONASE) 50 MCG/ACT nasal spray INHALE 2 SPRAYS IN EACH NOSTRIL EVERY DAY 16 g 7  . irbesartan (AVAPRO) 300 MG tablet TAKE 1 TABLET BY MOUTH EVERY DAY 30 tablet 10  . loratadine (CLARITIN) 10 MG tablet Take 1 tablet (10 mg total) by mouth daily. 30 tablet 11  . metoprolol tartrate (LOPRESSOR) 25 MG tablet Take 0.5 tablets (12.5 mg total) by mouth 2 (two) times daily. 30 tablet 10  . nitroGLYCERIN (NITROSTAT) 0.4 MG SL tablet Place 1 tablet (0.4 mg total) under the tongue every 5 (five) minutes as needed for chest pain. 25 tablet 3  . omeprazole (PRILOSEC) 20 MG capsule TAKE ONE CAPSULE BY MOUTH EVERY DAY 90 capsule 2  . ticagrelor  (BRILINTA) 90 MG TABS tablet Take 1 tablet (90 mg total) by mouth 2 (two) times daily. 180 tablet 2  . triamcinolone (KENALOG) 0.025 % cream Apply 1 application topically 2 (two) times daily as needed.     No current facility-administered medications for this visit.    LABS/IMAGING: No results found for this or any previous visit (from the past 48 hour(s)). No results found.  VITALS: BP 112/64 mmHg  Pulse 63  Ht 6' 2.5" (1.892 m)  Wt 220 lb 4.8 oz (99.927 kg)  BMI 27.92 kg/m2  EXAM: General appearance: alert and no distress Neck: no carotid bruit and no JVD Lungs: clear to  auscultation bilaterally Heart: regular rate and rhythm, S1, S2 normal, no murmur, click, rub or gallop Abdomen: soft, non-tender; bowel sounds normal; no masses,  no organomegaly Extremities: there is left rotator cuff impingment, pain and weakness with extension and external rotation Pulses: 2+ and symmetric Skin: Skin color, texture, turgor normal. No rashes or lesions Neurologic: Grossly normal Psych: Mood, affect normal  EKG: Normal sinus rhythm at 63  ASSESSMENT: 1. Coronary artery disease status post PCI in 1997 (PS Stent to proximal LAD) and 2015 (Promus premier DES to RCA) 2. Hepatitis C-untreated 3. Dyslipidemia-dose Lipitor  PLAN: 1.   Adam Peters is doing well from a cardiac standpoint. He is due for repeat cholesterol profile and will go ahead and check that. In addition he is now one year after stenting with a drug-eluting stent. At this point I feel that he could come off of his Brilinta as of 08/03/2014. He should continue on aspirin monotherapy. I've encouraged him to continue to get exercise is strongly encouraged him to seek treatment for his hepatitis C which she says should occur next month.  Overall is doing well we'll see him back in 6 months.  Pixie Casino, MD, [facility] Attending Cardiologist [facility] 07/25/2014, 3:19 PM

## 2014-07-25 NOTE — Patient Instructions (Signed)
Your physician recommends that you return for lab work at your convenience - fasting.   Your physician wants you to follow-up in: 6 months with Dr. Debara Pickett. You will receive a reminder letter in the mail two months in advance. If you don't receive a letter, please call our office to schedule the follow-up appointment.

## 2014-10-02 ENCOUNTER — Encounter: Payer: Self-pay | Admitting: Gastroenterology

## 2014-10-26 ENCOUNTER — Other Ambulatory Visit: Payer: Self-pay | Admitting: Internal Medicine

## 2014-10-29 NOTE — Telephone Encounter (Signed)
Rx(s) sent to pharmacy electronically.  

## 2014-11-20 ENCOUNTER — Encounter: Payer: Self-pay | Admitting: Gastroenterology

## 2014-11-20 ENCOUNTER — Ambulatory Visit (INDEPENDENT_AMBULATORY_CARE_PROVIDER_SITE_OTHER): Payer: No Typology Code available for payment source | Admitting: Gastroenterology

## 2014-11-20 ENCOUNTER — Other Ambulatory Visit (INDEPENDENT_AMBULATORY_CARE_PROVIDER_SITE_OTHER): Payer: No Typology Code available for payment source

## 2014-11-20 ENCOUNTER — Telehealth: Payer: Self-pay | Admitting: Internal Medicine

## 2014-11-20 VITALS — BP 124/76 | HR 68 | Ht 75.0 in | Wt 224.8 lb

## 2014-11-20 DIAGNOSIS — D509 Iron deficiency anemia, unspecified: Secondary | ICD-10-CM

## 2014-11-20 LAB — CBC WITH DIFFERENTIAL/PLATELET
Basophils Absolute: 0 10*3/uL (ref 0.0–0.1)
Basophils Relative: 0.4 % (ref 0.0–3.0)
Eosinophils Absolute: 0.1 10*3/uL (ref 0.0–0.7)
Eosinophils Relative: 2.7 % (ref 0.0–5.0)
HCT: 40.6 % (ref 39.0–52.0)
HEMOGLOBIN: 13.7 g/dL (ref 13.0–17.0)
LYMPHS ABS: 1.2 10*3/uL (ref 0.7–4.0)
LYMPHS PCT: 23.2 % (ref 12.0–46.0)
MCHC: 33.6 g/dL (ref 30.0–36.0)
MCV: 91.9 fl (ref 78.0–100.0)
MONOS PCT: 7.9 % (ref 3.0–12.0)
Monocytes Absolute: 0.4 10*3/uL (ref 0.1–1.0)
NEUTROS ABS: 3.3 10*3/uL (ref 1.4–7.7)
Neutrophils Relative %: 65.8 % (ref 43.0–77.0)
Platelets: 182 10*3/uL (ref 150.0–400.0)
RBC: 4.42 Mil/uL (ref 4.22–5.81)
RDW: 12.8 % (ref 11.5–15.5)
WBC: 5 10*3/uL (ref 4.0–10.5)

## 2014-11-20 NOTE — Progress Notes (Signed)
HPI: This is a very pleasant 64 year old man who was referred to me by Orma Flaming, MD  to evaluate  anemia    Chief complaint is anemia  He had colonoscopy in 2003 Dr. Lajoyce Corners, small HP removed. He had colonoscopy by me 11/2006 that was normal. I recommended recall colonsocopy in 5-10 years, to be decided by Dr. Lajoyce Corners who was his primary GI at the time. Dr. Lajoyce Corners has since retired. Routine risk colon cancer screening was recommended for 2018.  He has chronic hepatitis C and is currently seeing a hepatologist at [facility] who is considering eradication treatment.  His PCP at the [place] told him he was anemic [facility]  No overt GI bleeding.  He had no stool testing.  Feels absolutely fine. No bowel issues.  CAD with 2 drug eluting, stopped brlinta 3 months ago after 1 year course.  He stopped aspirin due to starting harvoni (started 6 weeks ago, run by Dr. Phillips Hay).  Anemia predates the harvoni   Review of systems: Pertinent positive and negative review of systems were noted in the above HPI section. Complete review of systems was performed and was otherwise normal.   Past Medical History  Diagnosis Date  . Arthritis   . DDD (degenerative disc disease), cervical 01/12/2012  . Coronary artery disease     s/p PCI to LAD (1997) - Dr. Loni Muse. Little  . History of tobacco use   . Hepatitis C   . CAD (coronary artery disease)     a. 1997 PCI/BMS to distal LAD (J&J PS BMS);  b. 03/2011 Neg MV;  c. 07/2013 NSTEMI/PCI: LM 20d, LAD 64m, 50d isr, LCX nl, OM1/2 nl, RCA dom 95-65m (3.5x16 Promus premier DES), EF 55%, mild basal inf HK.  Marland Kitchen Hypertension   . Schizophrenia 1978  . Hyperlipidemia   . Anemia     Past Surgical History  Procedure Laterality Date  . Appendectomy  1965  . Coronary angioplasty with stent placement  06/09/1995    prox LAD stenting 80% to 0% (Dr. Rockne Menghini) - repeat cath in 11/1999 showed no significant coronary disease & normal systolic function (Dr. Gerrie Nordmann)  . Hernia repair  1994  . Knife wound repair  1969  . Transthoracic echocardiogram  11/13/2008    EF 50-55%, normal LV systolic function; RA mildly dilated; trace MR/TR/PR  . Coronary stent placement  08/02/2013    RCA   DES         DR COOPER  . Cardiac catheterization  2001    patent LAD stent (Dr Georges Lynch)  . Left heart catheterization with coronary angiogram N/A 08/02/2013    Procedure: LEFT HEART CATHETERIZATION WITH CORONARY ANGIOGRAM;  Surgeon: Blane Ohara, MD;  Location: [facility] CATH LAB;  Service: Cardiovascular;  Laterality: N/A;    Current Outpatient Prescriptions  Medication Sig Dispense Refill  . amLODipine (NORVASC) 5 MG tablet Take 1 tablet (5 mg total) by mouth daily. 30 tablet 10  . atorvastatin (LIPITOR) 10 MG tablet Take 1 tablet (10 mg total) by mouth daily. 90 tablet 2  . dextromethorphan 15 MG/5ML syrup Take 10 mLs (30 mg total) by mouth 4 (four) times daily as needed for cough. 120 mL 0  . dorzolamide-timolol (COSOPT) 22.3-6.8 MG/ML ophthalmic solution Place 1 drop into both eyes 2 (two) times daily.    . fluticasone (FLONASE) 50 MCG/ACT nasal spray INHALE 2 SPRAYS IN EACH NOSTRIL EVERY DAY 16 g 7  . irbesartan (AVAPRO) 300 MG  tablet TAKE 1 TABLET BY MOUTH EVERY DAY 30 tablet 10  . loratadine (CLARITIN) 10 MG tablet Take 1 tablet (10 mg total) by mouth daily. 30 tablet 11  . metoprolol tartrate (LOPRESSOR) 25 MG tablet Take 0.5 tablets (12.5 mg total) by mouth 2 (two) times daily. 30 tablet 10  . nitroGLYCERIN (NITROSTAT) 0.4 MG SL tablet Place 1 tablet (0.4 mg total) under the tongue every 5 (five) minutes as needed for chest pain. 25 tablet 3  . triamcinolone (KENALOG) 0.025 % cream Apply 1 application topically 2 (two) times daily as needed.    Marland Kitchen aspirin 81 MG tablet Take 81 mg by mouth daily.      No current facility-administered medications for this visit.    Allergies as of 11/20/2014 - Review Complete 11/20/2014  Allergen Reaction Noted  . Amoxicillin  Diarrhea 02/15/2013    Family History  Problem Relation Age of Onset  . Asthma Mother   . Crohn's disease Mother   . Hepatitis C Brother   . Dementia Father     History   Social History  . Marital Status: Married    Spouse Name: N/A  . Number of Children: N/A  . Years of Education: B.A.   Occupational History  .  [employer]   Social History Main Topics  . Smoking status: Former Smoker -- 1.00 packs/day    Types: Cigarettes    Quit date: 05/18/1993  . Smokeless tobacco: Never Used  . Alcohol Use: 1.8 oz/week    3 Glasses of wine per week     Comment: SOCIAL  . Drug Use: No  . Sexual Activity: Not on file   Other Topics Concern  . Not on file   Social History Narrative     Physical Exam: BP 124/76 mmHg  Pulse 68  Ht 6\' 3"  (1.905 m)  Wt 224 lb 12.8 oz (101.969 kg)  BMI 28.10 kg/m2 Constitutional: generally well-appearing Psychiatric: alert and oriented x3 Eyes: extraocular movements intact Mouth: oral pharynx moist, no lesions Neck: supple no lymphadenopathy Cardiovascular: heart regular rate and rhythm Lungs: clear to auscultation bilaterally Abdomen: soft, nontender, nondistended, no obvious ascites, no peritoneal signs, normal bowel sounds Extremities: no lower extremity edema bilaterally Skin: no lesions on visible extremities   Assessment and plan: 64 y.o. male with  low blood counts, hepatitis C, coronary artery disease  First he was started on hepatitis C eradication treatment about 6 weeks ago. He was told by a [place] who is his primary care doctor that he should stop omeprazole and also aspirin while on the Harvoni. He has drug-eluting stents in place has had coronary artery disease significantly and I think it is imperative that he stay on aspirin. I recommended strongly that he talk to his hepatologist about the safety of him resuming once daily aspirin and daily proton pump inhibitor therapy as soon as possible. I will also forward this  result note to his cardiologist. We do not have any documentation about his blood counts recently. We will work to get that sent here by the [place]. He will also have heme occult testing of his stool I home kits. Also a blood count today. I will make a decision about necessity of further testing from a GI perspective after I see all those results.   Owens Loffler, MD [facility] 11/20/2014, 8:42 AM  Cc: Orma Flaming, MD

## 2014-11-20 NOTE — Patient Instructions (Signed)
Records from [place].  Specifically labs (cbc, is he anemic). Two sets of FOBT home testing. You will have labs checked today in the basement lab.  Please head down after you check out with the front desk  (cbc). Pending those results, will consider colonoscopy. You need to talk with Dr. Claiborne Rigg about getting back on aspirin and omeprazole (ASAP).

## 2014-11-20 NOTE — Telephone Encounter (Signed)
Patient reports his infectious disease MD Dr. Janalyn Harder with [facility] wants to prescribe Harvani for 12 weeks. He will need to come off aspirin and omeprazole for this (omeprazole interferes with absorption). Patient would like to know if this is OK with Dr. Debara Pickett. Informed patient I would send Dr. Debara Pickett the message to review and advise on.

## 2014-11-20 NOTE — Telephone Encounter (Signed)
Patient notified of Dr. Lysbeth Penner advice and he will continue aspirin 81mg  daily

## 2014-11-20 NOTE — Telephone Encounter (Signed)
Please call,concerning his medications.

## 2014-11-20 NOTE — Telephone Encounter (Signed)
He can come off of omeprazole, but I would be hesitant to have him completely off of low-dose aspirin for 12 weeks. I would advise he continue aspirin if at all possible.   Dr. Lemmie Evens

## 2014-11-26 ENCOUNTER — Other Ambulatory Visit: Payer: No Typology Code available for payment source

## 2014-11-26 ENCOUNTER — Other Ambulatory Visit (INDEPENDENT_AMBULATORY_CARE_PROVIDER_SITE_OTHER): Payer: No Typology Code available for payment source

## 2014-11-26 DIAGNOSIS — R188 Other ascites: Secondary | ICD-10-CM

## 2014-11-26 DIAGNOSIS — D509 Iron deficiency anemia, unspecified: Secondary | ICD-10-CM | POA: Diagnosis not present

## 2014-11-26 LAB — HEMOCCULT SLIDES (X 3 CARDS)
FECAL OCCULT BLD: NEGATIVE
FECAL OCCULT BLD: NEGATIVE
OCCULT 1: NEGATIVE
OCCULT 1: NEGATIVE
OCCULT 2: NEGATIVE
OCCULT 2: NEGATIVE
OCCULT 3: NEGATIVE
OCCULT 3: NEGATIVE
OCCULT 4: NEGATIVE
OCCULT 4: NEGATIVE
OCCULT 5: NEGATIVE
OCCULT 5: NEGATIVE

## 2014-12-01 ENCOUNTER — Other Ambulatory Visit: Payer: Self-pay | Admitting: Internal Medicine

## 2014-12-03 ENCOUNTER — Other Ambulatory Visit: Payer: Self-pay

## 2014-12-03 DIAGNOSIS — D509 Iron deficiency anemia, unspecified: Secondary | ICD-10-CM

## 2014-12-10 ENCOUNTER — Telehealth: Payer: Self-pay | Admitting: Gastroenterology

## 2014-12-10 NOTE — Telephone Encounter (Signed)
Labs from [place]: 11/30/14: cbcc normal (hb 13.5)

## 2015-01-23 ENCOUNTER — Ambulatory Visit (INDEPENDENT_AMBULATORY_CARE_PROVIDER_SITE_OTHER): Payer: No Typology Code available for payment source | Admitting: Internal Medicine

## 2015-01-23 ENCOUNTER — Encounter: Payer: Self-pay | Admitting: Internal Medicine

## 2015-01-23 VITALS — BP 128/78 | HR 50 | Ht 74.0 in | Wt 224.7 lb

## 2015-01-23 DIAGNOSIS — I214 Non-ST elevation (NSTEMI) myocardial infarction: Secondary | ICD-10-CM

## 2015-01-23 DIAGNOSIS — I2583 Coronary atherosclerosis due to lipid rich plaque: Secondary | ICD-10-CM

## 2015-01-23 DIAGNOSIS — I2510 Atherosclerotic heart disease of native coronary artery without angina pectoris: Secondary | ICD-10-CM | POA: Diagnosis not present

## 2015-01-23 DIAGNOSIS — Z79899 Other long term (current) drug therapy: Secondary | ICD-10-CM | POA: Diagnosis not present

## 2015-01-23 DIAGNOSIS — E785 Hyperlipidemia, unspecified: Secondary | ICD-10-CM | POA: Diagnosis not present

## 2015-01-23 DIAGNOSIS — I10 Essential (primary) hypertension: Secondary | ICD-10-CM | POA: Diagnosis not present

## 2015-01-23 NOTE — Progress Notes (Signed)
OFFICE NOTE  Chief Complaint:  No complaints  Primary Care Physician: PROVIDER NOT IN SYSTEM  HPI:  Adam Peters is a 64 year old gentleman who I have been following for history of coronary disease status post PCI of the LAD in 1997 (with a Palmaz-Schatz stent). He has history of smoking in the past but has discontinued that. Echo in 2010 showed a low normal EF and he had a stress test which was negative. Recently, this past fall he was contemplating treatment for hepatitis C and underwent a stress test which was negative for ischemia. At that time, EF was 51%. He, unfortunately, did not undergo treatment at that time; however, has since been set up to see Dr. Patsy Baltimore at [facility] by you who was contemplating treatment starting in December. He is also contemplating retirement at the end of January of next year.   When I last saw him about 6 months ago he was doing fairly well. I understand he was seen just a few days ago in urgent care. He had been doing some new exercise routine and had noted tightness after exercise in his chest. The symptoms were certainly worse with exertion and ultimately did relieve themselves mostly at rest. Since that time he's had worsening shortness of breath and discomfort in his chest especially when walking up stairs or doing most activities. He appears visibly disturbed by this today. He did have a chest x-ray performed which showed no acute disease. An EKG performed at his primary care provider's office showed sinus bradycardia with no ischemic changes. A troponin was checked and was positive at 0.21.  Adam Peters was subsequently referred for cardiac catheterization and found to have acute right coronary artery occlusion. He underwent placement of a Promus Premier 3.5 x 16 mm drug-eluting stent to the mid right coronary artery. Since then he's had no significant anginal symptoms. He's been active and exercising. Unfortunately he has not been able to get  treatment for his hepatitis C due to a shortage of medications. He has however recently been having problems in his left shoulder and left upper chest. He was concerned about this being ischemia. The symptoms are worse when swimming particularly and he feels pain in his left shoulder when outstretching his arm.  Adam Peters returns today for follow-up. He is feeling quite well. He denies any chest pain or shortness of breath. He continues to be active and without complaints. He is scheduled to have treatment for his hepatitis C next month. Hopefully that can be cured. He's a wart a repeat check of his cholesterol. It's now been almost one year since his drug-eluting stent placement  I saw Adam Peters back in the office today. He is doing exceedingly well. He denies any chest pain or shortness of breath. He recently had treatment of his hepatitis C which is cured by Harvoni. He was having some reflux symptoms but those have improved with an increase in his Prilosec to 40 mg daily. Blood pressure is well-controlled.  PMHx:  Past Medical History  Diagnosis Date  . Arthritis   . DDD (degenerative disc disease), cervical 01/12/2012  . Coronary artery disease     s/p PCI to LAD (1997) - Dr. Loni Muse. Little  . History of tobacco use   . Hepatitis C   . CAD (coronary artery disease)     a. 1997 PCI/BMS to distal LAD (J&J PS BMS);  b. 03/2011 Neg MV;  c. 07/2013 NSTEMI/PCI: LM 20d, LAD 15m, 50d  isr, LCX nl, OM1/2 nl, RCA dom 95-16m (3.5x16 Promus premier DES), EF 55%, mild basal inf HK.  Marland Kitchen Hypertension   . Schizophrenia 1978  . Hyperlipidemia   . Anemia     Past Surgical History  Procedure Laterality Date  . Appendectomy  1965  . Coronary angioplasty with stent placement  06/09/1995    prox LAD stenting 80% to 0% (Dr. Rockne Menghini) - repeat cath in 11/1999 showed no significant coronary disease & normal systolic function (Dr. Gerrie Nordmann)  . Hernia repair  1994  . Knife wound repair  1969  .  Transthoracic echocardiogram  11/13/2008    EF 50-55%, normal LV systolic function; RA mildly dilated; trace MR/TR/PR  . Coronary stent placement  08/02/2013    RCA   DES         DR COOPER  . Cardiac catheterization  2001    patent LAD stent (Dr Georges Lynch)  . Left heart catheterization with coronary angiogram N/A 08/02/2013    Procedure: LEFT HEART CATHETERIZATION WITH CORONARY ANGIOGRAM;  Surgeon: Blane Ohara, MD;  Location: [facility] CATH LAB;  Service: Cardiovascular;  Laterality: N/A;    FAMHx:  Family History  Problem Relation Age of Onset  . Asthma Mother   . Crohn's disease Mother   . Hepatitis C Brother   . Dementia Father     SOCHx:   reports that he quit smoking about 21 years ago. His smoking use included Cigarettes. He smoked 1.00 pack per day. He has never used smokeless tobacco. He reports that he drinks about 1.8 oz of alcohol per week. He reports that he does not use illicit drugs.  ALLERGIES:  Allergies  Allergen Reactions  . Amoxicillin Diarrhea    GI upset.    ROS: A comprehensive review of systems was negative.  HOME MEDS: Current Outpatient Prescriptions  Medication Sig Dispense Refill  . amLODipine (NORVASC) 5 MG tablet Take 1 tablet (5 mg total) by mouth daily. 30 tablet 10  . aspirin 81 MG tablet Take 81 mg by mouth daily.     Marland Kitchen atorvastatin (LIPITOR) 10 MG tablet TAKE 1 TABLET (10 MG TOTAL) BY MOUTH DAILY. 90 tablet 0  . budesonide-formoterol (SYMBICORT) 160-4.5 MCG/ACT inhaler Inhale 2 puffs into the lungs 2 (two) times daily.    . chlorpheniramine (CHLOR-TRIMETON) 4 MG tablet Take 4 mg by mouth. One at lunch. Two at bedtime.    Marland Kitchen dextromethorphan 15 MG/5ML syrup Take 10 mLs (30 mg total) by mouth 4 (four) times daily as needed for cough. 120 mL 0  . dorzolamide-timolol (COSOPT) 22.3-6.8 MG/ML ophthalmic solution Place 1 drop into both eyes 2 (two) times daily.    . fluticasone (FLONASE) 50 MCG/ACT nasal spray INHALE 2 SPRAYS IN EACH NOSTRIL EVERY DAY 16 g 7    . irbesartan (AVAPRO) 300 MG tablet TAKE 1 TABLET BY MOUTH EVERY DAY 30 tablet 10  . IRON PO Take 324 mg by mouth 2 (two) times daily.     . metoprolol tartrate (LOPRESSOR) 25 MG tablet Take 0.5 tablets (12.5 mg total) by mouth 2 (two) times daily. 30 tablet 10  . nitroGLYCERIN (NITROSTAT) 0.4 MG SL tablet Place 1 tablet (0.4 mg total) under the tongue every 5 (five) minutes as needed for chest pain. 25 tablet 3  . omeprazole (PRILOSEC) 20 MG capsule Take 40 mg by mouth daily.    Marland Kitchen triamcinolone (KENALOG) 0.025 % cream Apply 1 application topically 2 (two) times daily as needed.  No current facility-administered medications for this visit.    LABS/IMAGING: No results found for this or any previous visit (from the past 48 hour(s)). No results found.  VITALS: BP 128/78 mmHg  Pulse 50  Ht 6\' 2"  (1.88 m)  Wt 224 lb 11.2 oz (101.923 kg)  BMI 28.84 kg/m2  EXAM: General appearance: alert and no distress Neck: no carotid bruit and no JVD Lungs: clear to auscultation bilaterally Heart: regular rate and rhythm, S1, S2 normal, no murmur, click, rub or gallop Abdomen: soft, non-tender; bowel sounds normal; no masses,  no organomegaly Extremities: there is left rotator cuff impingment, pain and weakness with extension and external rotation Pulses: 2+ and symmetric Skin: Skin color, texture, turgor normal. No rashes or lesions Neurologic: Grossly normal Psych: Mood, affect normal  EKG: Normal sinus rhythm at 63  ASSESSMENT: 1. Coronary artery disease status post PCI in 1997 (PS Stent to proximal LAD) and 2015 (Promus premier DES to RCA) 2. Hepatitis C-now cured after treatment with Harvoni 3. Dyslipidemia-dose Lipitor  PLAN: 1.   Mr. Takahashi is doing well from a cardiac standpoint. He is due for repeat cholesterol profile and will go ahead and check that. I'm pleased that he has had a cure of his hepatitis C. He should continue on lifelong aspirin therapy. I've encouraged him to  continue to get exercise and eating healthy diet.  Overall is doing well we'll see him back in 6 to 12 months.  Pixie Casino, MD, [facility] Attending Cardiologist [place] 01/23/2015, 6:10 PM

## 2015-01-23 NOTE — Patient Instructions (Signed)
Your physician recommends that you return for lab work FASTING  Your physician wants you to follow-up in: 1 year with Dr. [name]. You will receive a reminder letter in the mail two months in advance. If you don't receive a letter, please call our office to schedule the follow-up appointment.  

## 2015-01-24 LAB — LIPID PANEL
CHOLESTEROL: 142 mg/dL (ref 125–200)
HDL: 42 mg/dL (ref >=40)
LDL CALC: 91 mg/dL (ref <130)
TRIGLYCERIDES: 44 mg/dL (ref <150)
Total CHOL/HDL Ratio: 3.4 Ratio (ref <=5.0)
VLDL: 9 mg/dL (ref <30)

## 2015-01-24 LAB — COMPREHENSIVE METABOLIC PANEL
ALT: 18 U/L (ref 9–46)
AST: 20 U/L (ref 10–35)
Albumin: 4.4 g/dL (ref 3.6–5.1)
Alkaline Phosphatase: 74 U/L (ref 40–115)
BILIRUBIN TOTAL: 0.7 mg/dL (ref 0.2–1.2)
BUN: 15 mg/dL (ref 7–25)
CO2: 27 mmol/L (ref 20–31)
CREATININE: 1.14 mg/dL (ref 0.70–1.25)
Calcium: 9.9 mg/dL (ref 8.6–10.3)
Chloride: 105 mmol/L (ref 98–110)
GLUCOSE: 103 mg/dL — AB (ref 65–99)
Potassium: 4.1 mmol/L (ref 3.5–5.3)
Sodium: 139 mmol/L (ref 135–146)
TOTAL PROTEIN: 7.9 g/dL (ref 6.1–8.1)

## 2015-01-31 ENCOUNTER — Other Ambulatory Visit: Payer: Self-pay | Admitting: Internal Medicine

## 2015-01-31 MED ORDER — OMEPRAZOLE 20 MG PO CPDR: 40.0000 mg | DELAYED_RELEASE_CAPSULE | Freq: Every day | ORAL | Status: DC

## 2015-01-31 MED ORDER — ATORVASTATIN CALCIUM 10 MG PO TABS: ORAL_TABLET | ORAL | Status: DC

## 2015-01-31 MED ORDER — IRBESARTAN 300 MG PO TABS: 300.0000 mg | ORAL_TABLET | Freq: Every day | ORAL | Status: DC

## 2015-01-31 MED ORDER — AMLODIPINE BESYLATE 5 MG PO TABS: 5.0000 mg | ORAL_TABLET | Freq: Every day | ORAL | Status: DC

## 2015-01-31 MED ORDER — METOPROLOL TARTRATE 25 MG PO TABS: 12.5000 mg | ORAL_TABLET | Freq: Two times a day (BID) | ORAL | Status: DC

## 2015-01-31 NOTE — Telephone Encounter (Signed)
[facility]'t find a [facility] with [facility] code of [place].  Called patient LMTCB

## 2015-01-31 NOTE — Telephone Encounter (Signed)
Pt called in wanting to see if he could start getting some medications that Dr. Debara Pickett has prescribed sent to the [facility] at [facility] for 90 days. Those are : Amlodipine         Atorvastatin         Irbasartan         Metoprolol         Omeprazole(new rx request)  I spoke with the lady at the [place] there and she said these could be e-scribed by putting [place] the [place] [phone] Any further question please contact the pt  Thanks

## 2015-01-31 NOTE — Telephone Encounter (Signed)
90 day supply sent to [facility]:  Amlodipine  Atorvastatin Irbesartan Metoprolol Omeprazole  Called and LVM patient that medications have been sent

## 2015-02-14 ENCOUNTER — Other Ambulatory Visit (INDEPENDENT_AMBULATORY_CARE_PROVIDER_SITE_OTHER): Payer: No Typology Code available for payment source

## 2015-02-14 DIAGNOSIS — D509 Iron deficiency anemia, unspecified: Secondary | ICD-10-CM

## 2015-02-14 LAB — CBC WITH DIFFERENTIAL/PLATELET
BASOS ABS: 0 10*3/uL (ref 0.0–0.1)
Basophils Relative: 0.7 % (ref 0.0–3.0)
EOS ABS: 0.1 10*3/uL (ref 0.0–0.7)
Eosinophils Relative: 2.7 % (ref 0.0–5.0)
HEMATOCRIT: 41.1 % (ref 39.0–52.0)
HEMOGLOBIN: 13.8 g/dL (ref 13.0–17.0)
LYMPHS PCT: 20 % (ref 12.0–46.0)
Lymphs Abs: 1 10*3/uL (ref 0.7–4.0)
MCHC: 33.7 g/dL (ref 30.0–36.0)
MCV: 90.8 fl (ref 78.0–100.0)
Monocytes Absolute: 0.4 10*3/uL (ref 0.1–1.0)
Monocytes Relative: 7.7 % (ref 3.0–12.0)
NEUTROS ABS: 3.4 10*3/uL (ref 1.4–7.7)
Neutrophils Relative %: 68.9 % (ref 43.0–77.0)
PLATELETS: 190 10*3/uL (ref 150.0–400.0)
RBC: 4.52 Mil/uL (ref 4.22–5.81)
RDW: 12.7 % (ref 11.5–15.5)
WBC: 4.9 10*3/uL (ref 4.0–10.5)

## 2015-04-23 ENCOUNTER — Other Ambulatory Visit: Payer: Self-pay | Admitting: Internal Medicine

## 2015-04-23 MED ORDER — METOPROLOL TARTRATE 25 MG PO TABS: 12.5000 mg | ORAL_TABLET | Freq: Two times a day (BID) | ORAL | Status: DC

## 2015-04-23 MED ORDER — IRBESARTAN 300 MG PO TABS: 300.0000 mg | ORAL_TABLET | Freq: Every day | ORAL | Status: DC

## 2015-04-23 MED ORDER — OMEPRAZOLE 20 MG PO CPDR
40.0000 mg | DELAYED_RELEASE_CAPSULE | Freq: Every day | ORAL | Status: DC
Start: 2015-04-23 — End: 2015-11-07

## 2015-04-23 MED ORDER — ATORVASTATIN CALCIUM 10 MG PO TABS: 10.0000 mg | ORAL_TABLET | Freq: Every day | ORAL | Status: DC

## 2015-04-23 MED ORDER — AMLODIPINE BESYLATE 5 MG PO TABS: 5.0000 mg | ORAL_TABLET | Freq: Every day | ORAL | Status: DC

## 2015-04-23 NOTE — Telephone Encounter (Signed)
°*  STAT* If patient is at the pharmacy, call can be transferred to refill team.   1. Which medications need to be refilled? (please list name of each medication and dose if known) Irbesartan, Amlodipine, Metoprolol tartrate, Atorvastatin, Omeprazole  2. Which pharmacy/location (including street and city if local pharmacy) is medication to be sent to?[facility] in [place]  [phone], [facility] NCP # [id number])  3. Do they need a 30 day or 90 day supply? [place]

## 2015-04-23 NOTE — Telephone Encounter (Signed)
Rx(s) sent to pharmacy electronically.  

## 2015-06-14 DIAGNOSIS — H02833 Dermatochalasis of right eye, unspecified eyelid: Secondary | ICD-10-CM | POA: Diagnosis not present

## 2015-06-14 DIAGNOSIS — H269 Unspecified cataract: Secondary | ICD-10-CM | POA: Diagnosis not present

## 2015-06-14 DIAGNOSIS — H401131 Primary open-angle glaucoma, bilateral, mild stage: Secondary | ICD-10-CM | POA: Diagnosis not present

## 2015-06-14 DIAGNOSIS — H18413 Arcus senilis, bilateral: Secondary | ICD-10-CM | POA: Diagnosis not present

## 2015-11-07 ENCOUNTER — Other Ambulatory Visit: Payer: Self-pay

## 2015-11-07 ENCOUNTER — Other Ambulatory Visit: Payer: Self-pay | Admitting: Internal Medicine

## 2015-11-07 MED ORDER — ATORVASTATIN CALCIUM 10 MG PO TABS: 10.0000 mg | ORAL_TABLET | Freq: Every day | ORAL | Status: DC

## 2015-11-07 MED ORDER — METOPROLOL TARTRATE 25 MG PO TABS: 12.5000 mg | ORAL_TABLET | Freq: Two times a day (BID) | ORAL | Status: DC

## 2015-11-07 MED ORDER — OMEPRAZOLE 20 MG PO CPDR: 40.0000 mg | DELAYED_RELEASE_CAPSULE | Freq: Two times a day (BID) | ORAL | Status: DC

## 2015-11-07 MED ORDER — AMLODIPINE BESYLATE 5 MG PO TABS: 5.0000 mg | ORAL_TABLET | Freq: Every day | ORAL | Status: DC

## 2015-11-07 MED ORDER — IRBESARTAN 300 MG PO TABS: 300.0000 mg | ORAL_TABLET | Freq: Every day | ORAL | Status: DC

## 2015-11-07 NOTE — Telephone Encounter (Signed)
Follow-up     *STAT* If patient is at the pharmacy, call can be transferred to refill team.   1. Which medications need to be refilled? (please list name of each medication and dose if known) Omeprazole 20 mg po twice daily, Amlodipine 5 mg po daily, Metoprolol 25 mg po daily, Atorvastatin 10 po daily  2. Which pharmacy/location (including street and city if local pharmacy) is medication to be sent to? Pt states he gets his medications through the mail  3. Do they need a 30 day or 90 day supply? 90 day supply

## 2015-11-07 NOTE — Telephone Encounter (Signed)
LM for patient to return call regarding which med is needing to be refilled

## 2015-11-07 NOTE — Telephone Encounter (Signed)
Rx(s) sent to pharmacy electronically.  

## 2015-11-07 NOTE — Telephone Encounter (Signed)
Called patient to verify pharmacy, [facility] due to [facility]

## 2015-11-07 NOTE — Telephone Encounter (Signed)
Follow-up     The pt is following up on his prescription refill, the came through last night and the pt was confirming it went through he did on line

## 2015-11-27 ENCOUNTER — Telehealth: Payer: Self-pay | Admitting: Internal Medicine

## 2015-11-27 MED ORDER — METOPROLOL TARTRATE 25 MG PO TABS: 12.5000 mg | ORAL_TABLET | Freq: Two times a day (BID) | ORAL | Status: DC

## 2015-11-27 MED ORDER — ATORVASTATIN CALCIUM 10 MG PO TABS: 10.0000 mg | ORAL_TABLET | Freq: Every day | ORAL | Status: DC

## 2015-11-27 NOTE — Telephone Encounter (Signed)
Rx(s) sent to pharmacy electronically.  

## 2015-11-27 NOTE — Telephone Encounter (Signed)
New message       *STAT* If patient is at the pharmacy, call can be transferred to refill team.   1. Which medications need to be refilled? (please list name of each medication and dose if known) atorvastatin 10mg  and metoprolol 25mg   2. Which pharmacy/location (including street and city if local pharmacy) is [facility] 3. Do they need a 30 day or 90 day supply? 90 day Patient has 5 pills left

## 2015-12-12 DIAGNOSIS — H02839 Dermatochalasis of unspecified eye, unspecified eyelid: Secondary | ICD-10-CM | POA: Diagnosis not present

## 2015-12-12 DIAGNOSIS — H18413 Arcus senilis, bilateral: Secondary | ICD-10-CM | POA: Diagnosis not present

## 2015-12-12 DIAGNOSIS — H401131 Primary open-angle glaucoma, bilateral, mild stage: Secondary | ICD-10-CM | POA: Diagnosis not present

## 2015-12-12 DIAGNOSIS — H02833 Dermatochalasis of right eye, unspecified eyelid: Secondary | ICD-10-CM | POA: Diagnosis not present

## 2016-01-06 IMAGING — CR DG CHEST 2V
2 series · 2 of 2 positions shown · non-contrast
Comparison: DG CHEST 2V dated 03/22/2013

CLINICAL DATA: Chest discomfort

EXAM:
CHEST  2 VIEW

[PA]
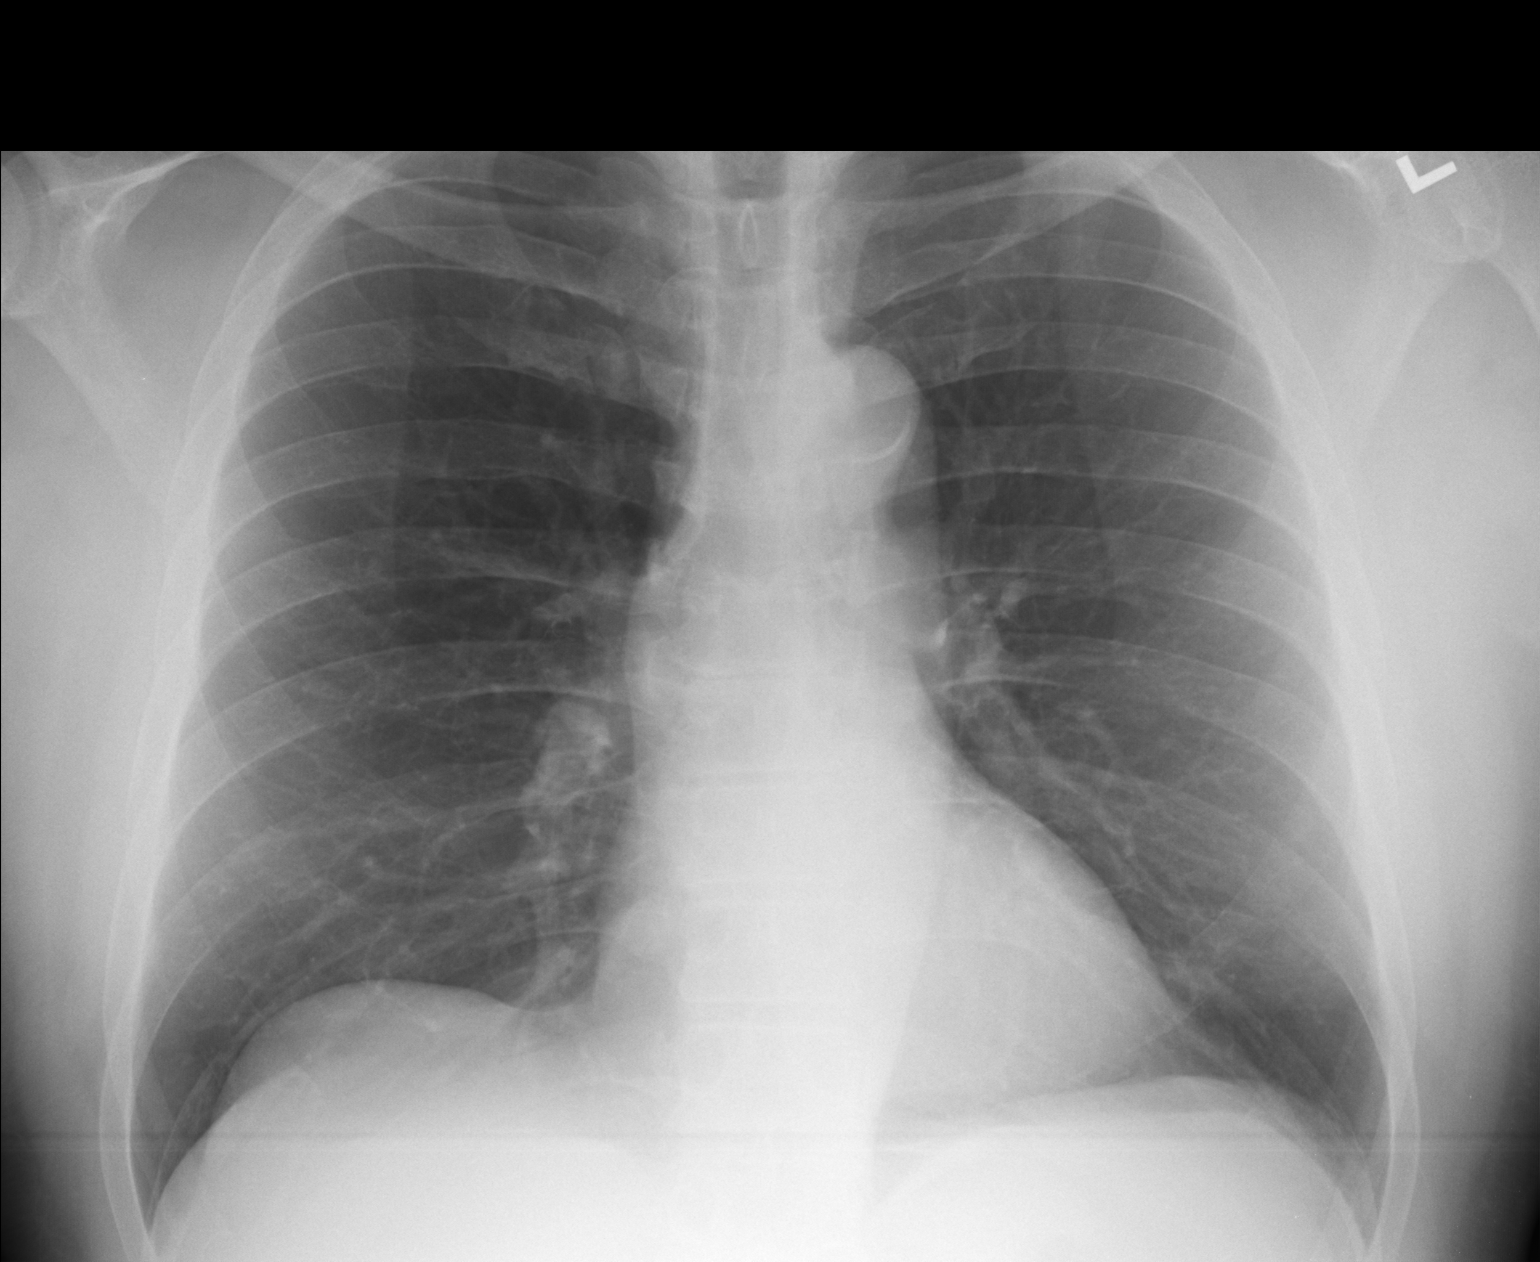

[lateral]
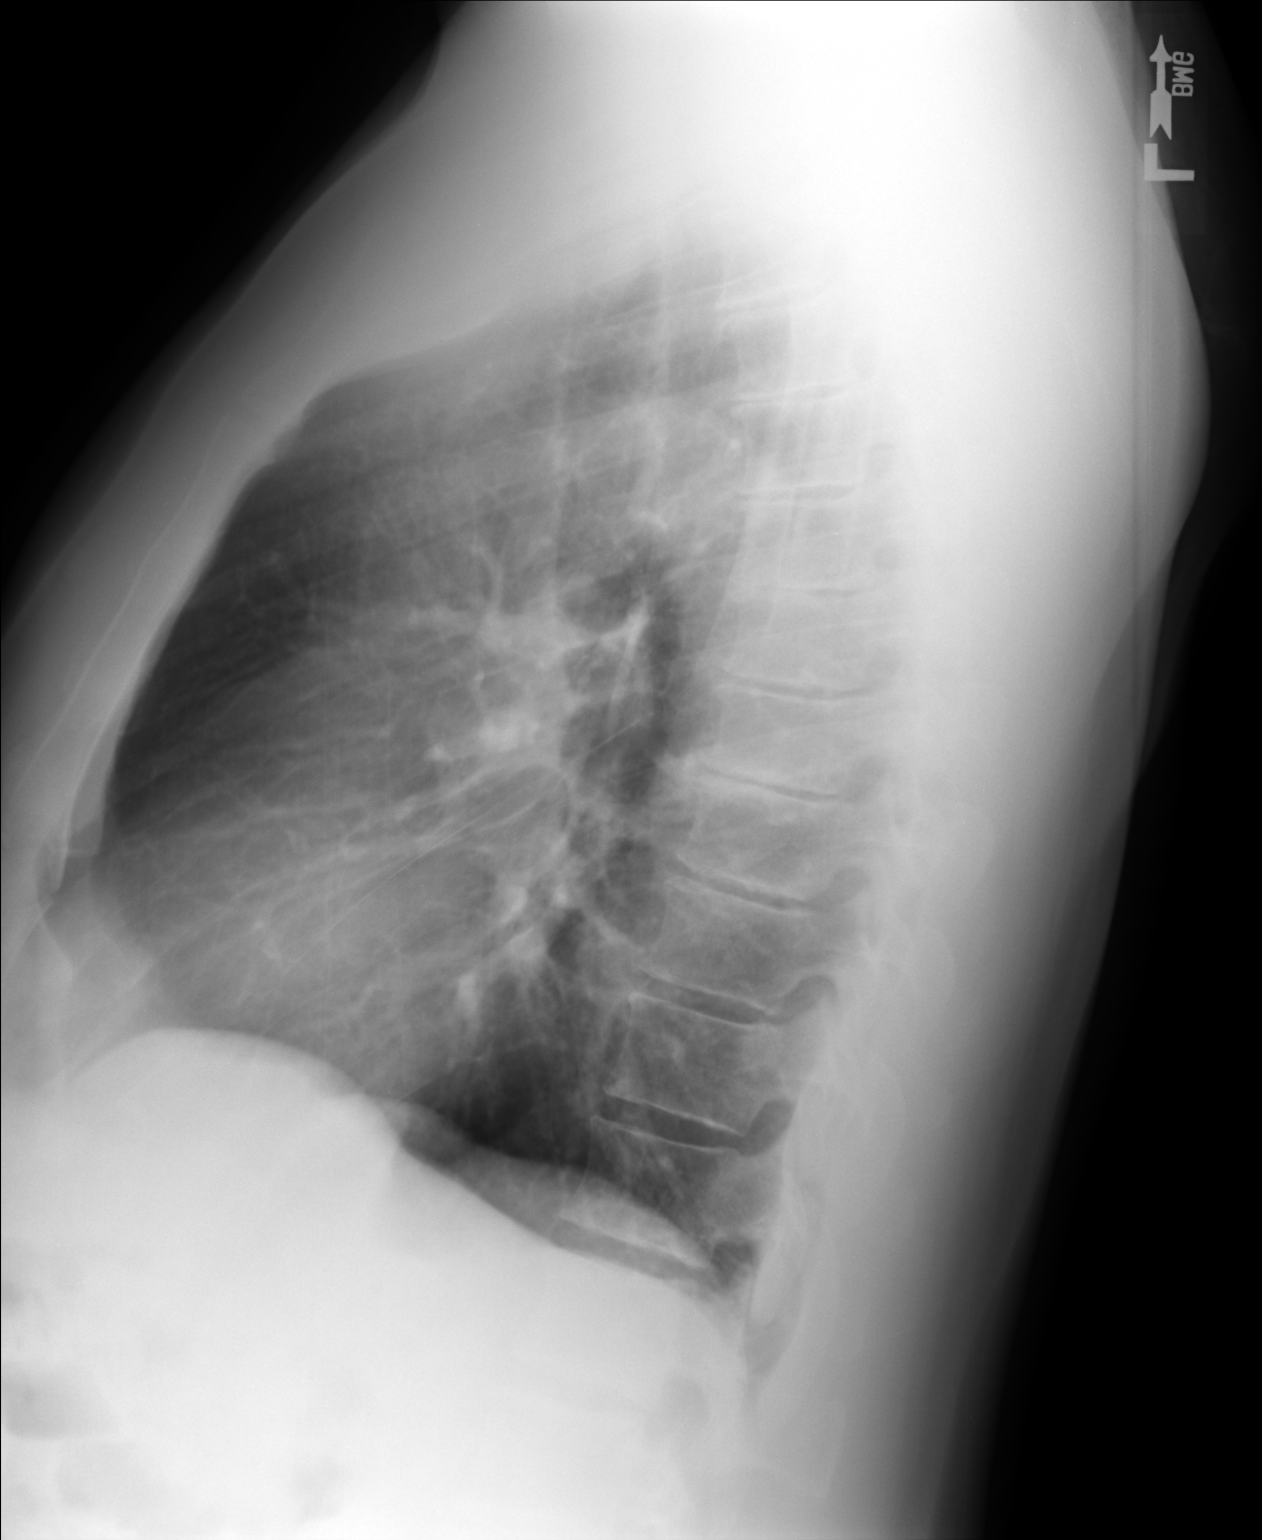

[2 of 2 positions shown; findings below may reference images not displayed]

FINDINGS: The heart size and mediastinal contours are within normal limits.
Both lungs are clear. The visualized skeletal structures are
unremarkable. Mild hyperinflation may suggest emphysema, unchanged.
IMPRESSION: No active cardiopulmonary disease.

## 2016-01-23 ENCOUNTER — Ambulatory Visit (INDEPENDENT_AMBULATORY_CARE_PROVIDER_SITE_OTHER): Payer: Medicare Other | Admitting: Internal Medicine

## 2016-01-23 ENCOUNTER — Encounter: Payer: Self-pay | Admitting: Internal Medicine

## 2016-01-23 VITALS — BP 114/70 | HR 48 | Ht 74.0 in | Wt 231.2 lb

## 2016-01-23 DIAGNOSIS — R001 Bradycardia, unspecified: Secondary | ICD-10-CM | POA: Diagnosis not present

## 2016-01-23 DIAGNOSIS — I2510 Atherosclerotic heart disease of native coronary artery without angina pectoris: Secondary | ICD-10-CM

## 2016-01-23 DIAGNOSIS — E785 Hyperlipidemia, unspecified: Secondary | ICD-10-CM | POA: Diagnosis not present

## 2016-01-23 DIAGNOSIS — R0609 Other forms of dyspnea: Secondary | ICD-10-CM | POA: Diagnosis not present

## 2016-01-23 NOTE — Patient Instructions (Addendum)
  Your physician recommends that you return for lab work FASTING (lipid, CMET)   Your physician has requested that you have en exercise stress myoview. For further information please visit [email or web address]. Please follow instruction sheet, as given.  Your physician recommends that you schedule a follow-up appointment after your stress test.
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
## 2016-01-29 DIAGNOSIS — E785 Hyperlipidemia, unspecified: Secondary | ICD-10-CM | POA: Diagnosis not present

## 2016-01-30 LAB — COMPREHENSIVE METABOLIC PANEL
ALK PHOS: 72 U/L (ref 40–115)
ALT: 11 U/L (ref 9–46)
AST: 16 U/L (ref 10–35)
Albumin: 4.2 g/dL (ref 3.6–5.1)
BILIRUBIN TOTAL: 0.7 mg/dL (ref 0.2–1.2)
BUN: 15 mg/dL (ref 7–25)
CO2: 23 mmol/L (ref 20–31)
Calcium: 9.3 mg/dL (ref 8.6–10.3)
Chloride: 106 mmol/L (ref 98–110)
Creat: 1.11 mg/dL (ref 0.70–1.25)
GLUCOSE: 94 mg/dL (ref 65–99)
Potassium: 3.9 mmol/L (ref 3.5–5.3)
Sodium: 140 mmol/L (ref 135–146)
Total Protein: 7.6 g/dL (ref 6.1–8.1)

## 2016-01-30 LAB — LIPID PANEL
CHOL/HDL RATIO: 3.6 ratio (ref <=5.0)
Cholesterol: 157 mg/dL (ref 125–200)
HDL: 44 mg/dL (ref >=40)
LDL CALC: 100 mg/dL (ref <130)
Triglycerides: 66 mg/dL (ref <150)
VLDL: 13 mg/dL (ref <30)

## 2016-02-05 ENCOUNTER — Telehealth (HOSPITAL_COMMUNITY): Payer: Self-pay

## 2016-02-05 ENCOUNTER — Other Ambulatory Visit: Payer: Self-pay | Admitting: *Deleted

## 2016-02-05 MED ORDER — METOPROLOL TARTRATE 25 MG PO TABS: 12.5000 mg | ORAL_TABLET | Freq: Two times a day (BID) | ORAL | 0 refills | Status: DC

## 2016-02-05 NOTE — Telephone Encounter (Signed)
Encounter complete. 

## 2016-02-06 ENCOUNTER — Encounter: Payer: Self-pay | Admitting: Internal Medicine

## 2016-02-06 ENCOUNTER — Ambulatory Visit (HOSPITAL_COMMUNITY)
Admission: RE | Admit: 2016-02-06 | Discharge: 2016-02-06 | Disposition: A | Discharge status: Home / Self Care | Payer: Medicare Other | Source: Ambulatory Visit | Attending: Cardiology | Admitting: Cardiology

## 2016-02-06 ENCOUNTER — Inpatient Hospital Stay (HOSPITAL_COMMUNITY): Admission: RE | Admit: 2016-02-06 | Payer: Medicare Other | Source: Ambulatory Visit

## 2016-02-06 DIAGNOSIS — I2510 Atherosclerotic heart disease of native coronary artery without angina pectoris: Secondary | ICD-10-CM | POA: Diagnosis not present

## 2016-02-06 DIAGNOSIS — Z87891 Personal history of nicotine dependence: Secondary | ICD-10-CM | POA: Insufficient documentation

## 2016-02-06 DIAGNOSIS — I10 Essential (primary) hypertension: Secondary | ICD-10-CM | POA: Diagnosis not present

## 2016-02-06 DIAGNOSIS — R0609 Other forms of dyspnea: Secondary | ICD-10-CM

## 2016-02-06 DIAGNOSIS — R9439 Abnormal result of other cardiovascular function study: Secondary | ICD-10-CM | POA: Diagnosis not present

## 2016-02-06 DIAGNOSIS — J449 Chronic obstructive pulmonary disease, unspecified: Secondary | ICD-10-CM | POA: Insufficient documentation

## 2016-02-06 LAB — MYOCARDIAL PERFUSION IMAGING
CHL CUP MPHR: 155 {beats}/min
CHL CUP RESTING HR STRESS: 48 {beats}/min
CHL RATE OF PERCEIVED EXERTION: 17
CSEPED: 11 min
CSEPEDS: 0 s
CSEPHR: 98 %
Estimated workload: 12.4 METS
LV dias vol: 124 mL (ref 62–150)
LV sys vol: 62 mL
Peak HR: 153 {beats}/min
SDS: 1
SRS: 3
SSS: 4
TID: 1.13

## 2016-02-06 MED ORDER — TECHNETIUM TC 99M TETROFOSMIN IV KIT
33.0000 | PACK | Freq: Once | INTRAVENOUS | Status: AC | PRN
  Administered 2016-02-06: 33 via INTRAVENOUS
  Filled 2016-02-06: qty 33

## 2016-02-06 MED ORDER — TECHNETIUM TC 99M TETROFOSMIN IV KIT
10.6000 | PACK | Freq: Once | INTRAVENOUS | Status: AC | PRN
  Administered 2016-02-06: 11 via INTRAVENOUS
  Filled 2016-02-06: qty 11

## 2016-02-07 ENCOUNTER — Encounter: Payer: Self-pay | Admitting: Internal Medicine

## 2016-02-21 ENCOUNTER — Ambulatory Visit (INDEPENDENT_AMBULATORY_CARE_PROVIDER_SITE_OTHER): Payer: Medicare Other | Admitting: Internal Medicine

## 2016-02-21 ENCOUNTER — Encounter: Payer: Self-pay | Admitting: Internal Medicine

## 2016-02-21 VITALS — BP 128/75 | HR 54 | Ht 74.0 in | Wt 235.2 lb

## 2016-02-21 DIAGNOSIS — I2510 Atherosclerotic heart disease of native coronary artery without angina pectoris: Secondary | ICD-10-CM | POA: Diagnosis not present

## 2016-02-21 DIAGNOSIS — I10 Essential (primary) hypertension: Secondary | ICD-10-CM | POA: Diagnosis not present

## 2016-02-21 DIAGNOSIS — R0609 Other forms of dyspnea: Secondary | ICD-10-CM | POA: Diagnosis not present

## 2016-02-21 DIAGNOSIS — R001 Bradycardia, unspecified: Secondary | ICD-10-CM | POA: Diagnosis not present

## 2016-02-21 NOTE — Patient Instructions (Signed)
Medication Instructions:  DECREASE- Metoprolol 12.5 mg daily for 1 week then STOP  Labwork: None Ordered  Testing/Procedures: None Ordered  Follow-Up: Your physician recommends that you schedule a follow-up appointment in: 1 Months Hypertension Clinic with Erasmo Downer or [place] physician wants you to follow-up in: 6 Months. You will receive a reminder letter in the mail two months in advance. If you don't receive a letter, please call our office to schedule the follow-up appointment.    Any Other Special Instructions Will Be Listed Below (If Applicable).   If you need a refill on your cardiac medications before your next appointment, please call your pharmacy.
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
## 2016-03-27 ENCOUNTER — Ambulatory Visit (INDEPENDENT_AMBULATORY_CARE_PROVIDER_SITE_OTHER): Payer: Medicare Other | Admitting: Pharmacist

## 2016-03-27 VITALS — BP 122/74 | HR 64

## 2016-03-27 DIAGNOSIS — I10 Essential (primary) hypertension: Secondary | ICD-10-CM

## 2016-03-27 DIAGNOSIS — I2510 Atherosclerotic heart disease of native coronary artery without angina pectoris: Secondary | ICD-10-CM

## 2016-03-27 NOTE — Patient Instructions (Addendum)
Return for a follow up appointment as scheduled with Dr. Debara Pickett  Check your blood pressure at home daily (if able) and keep record of the readings.  Take your BP meds as follows: Continue amlodipine 5mg  daily and irbesartan 300mg  daily  Bring all of your meds, your BP cuff and your record of home blood pressures to your next appointment.  Exercise as you're able, try to walk approximately 30 minutes per day.  Keep salt intake to a minimum, especially watch canned and prepared boxed foods.  Eat more fresh fruits and vegetables and fewer canned items.  Avoid eating in fast food restaurants.    HOW TO TAKE YOUR BLOOD PRESSURE: . Rest 5 minutes before taking your blood pressure. .  Don't smoke or drink caffeinated beverages for at least 30 minutes before. . Take your blood pressure before (not after) you eat. . Sit comfortably with your back supported and both feet on the floor (don't cross your legs). . Elevate your arm to heart level on a table or a desk. . Use the proper sized cuff. It should fit smoothly and snugly around your bare upper arm. There should be enough room to slip a fingertip under the cuff. The bottom edge of the cuff should be 1 inch above the crease of the elbow. . Ideally, take 3 measurements at one sitting and record the average.

## 2016-03-27 NOTE — Progress Notes (Signed)
Patient ID: Adam Peters                 DOB: 1950-10-11                      MRN: [id number]     HPI: Adam Peters is a 65 y.o. male patient of Dr. Debara Pickett with PMH below who presents today for hypertension evaluation. He recently had a negative stress test. At that time he requested to discontinue his beta blocker - he was advised to decrease to 12.5mg  BID for 1 week then discontinue altogether.   He decreased and stopped the metoprolol as directed. He reports no issues since stopping medication. He is wanting to further taper medications.   He reports no headaches.   Cardiac Hx: CAD, HTN, NSTEMI, HLD, hx of tobacco use, bradycardia  Current HTN meds:  Metoprolol tartrate 12.5mg  BID - discontinued a few weeks ago Irbesartan 300mg  daily Amlodipine 5mg  daily  BP goal: <150/90  Family History: He reports no known family history of hypertension.   Social History: He quit smoking when he was 65 years old. He denies other tobacco products and rarely drinks alcohol.   Diet: He eats mostly fast food when he eats out. His favorite fast food is Popeyes chicken, but he has been trying to cut back on this. He rarely cooks with salts and seasonings and never adds salt at the table. He rarely drinks coffee or tea.   Ex[facility], though he occasionally gets very motivated and will go every day for a 5 day stretch. He usually spends about an hour to hour and half doing treadmill, elliptical, and weight lifting.  Home BP readings:  He has a home arm cuff, but he has not been checking since he has seen Dr. Debara Pickett because he was scared his pressures might be elevated after stopping medication.   Wt Readings from Last 3 Encounters:  02/21/16 235 lb 3.2 oz (106.7 kg)  02/06/16 231 lb (104.8 kg)  01/23/16 231 lb 4 oz (104.9 kg)   BP Readings from Last 3 Encounters:  03/27/16 122/74  02/21/16 128/75  01/23/16 114/70   Pulse Readings from Last  3 Encounters:  03/27/16 64  02/21/16 (!) 54  01/23/16 (!) 48    Renal function: CrCl cannot be calculated (Patient's most recent lab result is older than the maximum 21 days allowed.).  Past Medical History:  Diagnosis Date  . Anemia   . Arthritis   . CAD (coronary artery disease)    a. 1997 PCI/BMS to distal LAD (J&J PS BMS);  b. 03/2011 Neg MV;  c. 07/2013 NSTEMI/PCI: LM 20d, LAD 50m, 50d isr, LCX nl, OM1/2 nl, RCA dom 95-55m (3.5x16 Promus premier DES), EF 55%, mild basal inf HK.  . Coronary artery disease    s/p PCI to LAD (1997) - Dr. Loni Muse. [facility]  . DDD (degenerative disc disease), cervical 01/12/2012  . Hepatitis C   . History of tobacco use   . Hyperlipidemia   . Hypertension   . Schizophrenia ([place]) 1978    Current Outpatient Prescriptions on File Prior to Visit  Medication Sig Dispense Refill  . amLODipine (NORVASC) 5 MG tablet Take 1 tablet (5 mg total) by mouth daily. 90 tablet 3  . aspirin 81 MG tablet Take 81 mg by mouth daily.     . calcipotriene (DOVONOX) 0.005 % cream Apply topically 2 (two)  times daily.    . cetirizine (ZYRTEC) 10 MG tablet Take 10 mg by mouth at bedtime.    . dorzolamide-timolol (COSOPT) 22.3-6.8 MG/ML ophthalmic solution Place 1 drop into both eyes 2 (two) times daily.    . irbesartan (AVAPRO) 300 MG tablet Take 1 tablet (300 mg total) by mouth daily. 90 tablet 3  . NONFORMULARY OR COMPOUNDED ITEM Asamax inhaler Take 2 puffs before bedtime daily    . NONFORMULARY OR COMPOUNDED ITEM Black seed oil take 1 tablespoon bid    . NONFORMULARY OR COMPOUNDED ITEM cla oil take 1 tab tid    . omeprazole (PRILOSEC) 20 MG capsule Take 2 capsules (40 mg total) by mouth 2 (two) times daily before a meal. 180 capsule 3  . atorvastatin (LIPITOR) 10 MG tablet Take 1 tablet (10 mg total) by mouth daily. 90 tablet 0  . ferrous sulfate 325 (65 FE) MG tablet Take 325 mg by mouth 2 (two) times daily with a meal.    . nitroGLYCERIN (NITROSTAT) 0.4 MG SL tablet Place 1  tablet (0.4 mg total) under the tongue every 5 (five) minutes as needed for chest pain. (Patient not taking: Reported on 03/27/2016) 25 tablet 3   No current facility-administered medications on file prior to visit.     Allergies  Allergen Reactions  . Amoxicillin Diarrhea    GI upset.    Blood pressure 122/74, pulse 64, SpO2 98 %.   Assessment/Plan: Hypertension: BP is at goal today. Continue amlodipine and irbesartan as prescribed. Asked that he keep a log and if pressures remained unchanged could consider tapering back additional medications. Follow up with Dr. Debara Pickett as scheduled and hypertension clinic as needed for additional dose taper.    Thank you, Lelan Pons. Menard Hammersmith, [facility]  03/27/2016 10:04 AM

## 2016-04-07 ENCOUNTER — Other Ambulatory Visit: Payer: Self-pay | Admitting: Internal Medicine

## 2016-05-28 ENCOUNTER — Other Ambulatory Visit: Payer: Self-pay | Admitting: *Deleted

## 2016-05-28 MED ORDER — ATORVASTATIN CALCIUM 10 MG PO TABS: 10.0000 mg | ORAL_TABLET | Freq: Every day | ORAL | 1 refills | Status: DC

## 2016-06-04 DIAGNOSIS — H02833 Dermatochalasis of right eye, unspecified eyelid: Secondary | ICD-10-CM | POA: Diagnosis not present

## 2016-06-04 DIAGNOSIS — H02839 Dermatochalasis of unspecified eye, unspecified eyelid: Secondary | ICD-10-CM | POA: Diagnosis not present

## 2016-06-04 DIAGNOSIS — H18413 Arcus senilis, bilateral: Secondary | ICD-10-CM | POA: Diagnosis not present

## 2016-06-04 DIAGNOSIS — H401131 Primary open-angle glaucoma, bilateral, mild stage: Secondary | ICD-10-CM | POA: Diagnosis not present

## 2016-06-18 ENCOUNTER — Telehealth: Payer: Self-pay | Admitting: Internal Medicine

## 2016-06-18 MED ORDER — SILDENAFIL CITRATE 100 MG PO TABS: 100.0000 mg | ORAL_TABLET | Freq: Every day | ORAL | 6 refills | Status: DC | PRN

## 2016-06-18 NOTE — Telephone Encounter (Signed)
New Message   *STAT* If patient is at the pharmacy, call can be transferred to refill team.   1. Which medications need to be refilled? (please list name of each medication and dose if known) Viagra 100 mg tablets  2. Which pharmacy/location (including street and city if local pharmacy) is medication to be sent to? [facility] [address] [place], [place]  3. Do they need a 30 day or 90 day supply? 30 day supply  Pt voiced he would like to know if there's a drug interaction with this medication.  Please f/u with pt

## 2016-06-18 NOTE — Telephone Encounter (Signed)
Spoke to patient. [facility] [place] Patient would like 5 tablets , patient states he will call if he would like to try [place]

## 2016-06-18 NOTE — Telephone Encounter (Signed)
I'm okay prescribing that. I usually prescribe revatio 20 mg (2-3 tablets prior to sexual activity) - he can get it cheaper from [facility] in [place]. If he wants brand name Viagra, that is fine too.  Dr. Lemmie Evens

## 2016-06-18 NOTE — Telephone Encounter (Signed)
Spoke to patient Patient states he would like a prescription for viagra  he is aware not use NTG sublingual at least 48 hours before or after usage.  patient aware need authorization from Dr Debara Pickett to prescribe.   will defer to  Dr Debara Pickett, for RX and dosage -

## 2016-08-13 ENCOUNTER — Other Ambulatory Visit: Payer: Self-pay | Admitting: *Deleted

## 2016-08-13 MED ORDER — IRBESARTAN 300 MG PO TABS: 300.0000 mg | ORAL_TABLET | Freq: Every day | ORAL | 0 refills | Status: DC

## 2016-08-13 MED ORDER — ATORVASTATIN CALCIUM 10 MG PO TABS: 10.0000 mg | ORAL_TABLET | Freq: Every day | ORAL | 0 refills | Status: DC

## 2016-08-17 ENCOUNTER — Telehealth: Payer: Self-pay | Admitting: *Deleted

## 2016-08-17 ENCOUNTER — Other Ambulatory Visit: Payer: Self-pay | Admitting: *Deleted

## 2016-08-17 MED ORDER — SILDENAFIL CITRATE 100 MG PO TABS: 100.0000 mg | ORAL_TABLET | Freq: Every day | ORAL | 6 refills | Status: DC | PRN

## 2016-08-17 NOTE — Telephone Encounter (Signed)
Received patient as a walk in requesting Rx (for Viagra) be faxed to [facility] outpatient clinic in [place] at [phone]. [facility] and is going to [facility] too much.  Rx filled by Dr. Rossie Muskrat Rx, signed by MD and faxed to # requested.

## 2016-08-24 ENCOUNTER — Encounter: Payer: Self-pay | Admitting: Internal Medicine

## 2016-08-24 ENCOUNTER — Ambulatory Visit (INDEPENDENT_AMBULATORY_CARE_PROVIDER_SITE_OTHER): Payer: Medicare Other | Admitting: Internal Medicine

## 2016-08-24 VITALS — BP 102/68 | HR 59 | Ht 74.0 in | Wt 208.0 lb

## 2016-08-24 DIAGNOSIS — E7849 Other hyperlipidemia: Secondary | ICD-10-CM

## 2016-08-24 DIAGNOSIS — I10 Essential (primary) hypertension: Secondary | ICD-10-CM

## 2016-08-24 DIAGNOSIS — I2510 Atherosclerotic heart disease of native coronary artery without angina pectoris: Secondary | ICD-10-CM

## 2016-08-24 DIAGNOSIS — Z79899 Other long term (current) drug therapy: Secondary | ICD-10-CM | POA: Diagnosis not present

## 2016-08-24 DIAGNOSIS — E784 Other hyperlipidemia: Secondary | ICD-10-CM | POA: Diagnosis not present

## 2016-08-24 MED ORDER — NITROGLYCERIN 0.4 MG SL SUBL: 0.4000 mg | SUBLINGUAL_TABLET | SUBLINGUAL | 3 refills | Status: DC | PRN

## 2016-08-24 NOTE — Patient Instructions (Addendum)
Your physician has recommended you make the following change in your medication:  -- decrease amlodipine to 2.5mg  daily  Your physician recommends that you return for lab work fasting to check cholesterol & CMET  Your physician wants you to follow-up in: 6 months with Dr. Debara Pickett. You will receive a reminder letter in the mail two months in advance. If you don't receive a letter, please call our office to schedule the follow-up appointment.
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
## 2016-08-25 ENCOUNTER — Telehealth: Payer: Self-pay | Admitting: Internal Medicine

## 2016-08-25 DIAGNOSIS — E784 Other hyperlipidemia: Secondary | ICD-10-CM | POA: Diagnosis not present

## 2016-08-25 DIAGNOSIS — Z79899 Other long term (current) drug therapy: Secondary | ICD-10-CM | POA: Diagnosis not present

## 2016-08-25 LAB — COMPREHENSIVE METABOLIC PANEL
ALBUMIN: 4.4 g/dL (ref 3.6–5.1)
ALK PHOS: 83 U/L (ref 40–115)
ALT: 11 U/L (ref 9–46)
AST: 14 U/L (ref 10–35)
BUN: 20 mg/dL (ref 7–25)
CHLORIDE: 107 mmol/L (ref 98–110)
CO2: 25 mmol/L (ref 20–31)
Calcium: 9.5 mg/dL (ref 8.6–10.3)
Creat: 1.24 mg/dL (ref 0.70–1.25)
Glucose, Bld: 99 mg/dL (ref 65–99)
POTASSIUM: 4.2 mmol/L (ref 3.5–5.3)
Sodium: 139 mmol/L (ref 135–146)
Total Bilirubin: 0.8 mg/dL (ref 0.2–1.2)
Total Protein: 7.5 g/dL (ref 6.1–8.1)

## 2016-08-25 LAB — LIPID PANEL
CHOLESTEROL: 124 mg/dL (ref <200)
HDL: 40 mg/dL — AB (ref >40)
LDL Cholesterol: 74 mg/dL (ref <100)
TRIGLYCERIDES: 48 mg/dL (ref <150)
Total CHOL/HDL Ratio: 3.1 Ratio (ref <5.0)
VLDL: 10 mg/dL (ref <30)

## 2016-08-25 MED ORDER — AMLODIPINE BESYLATE 2.5 MG PO TABS: 2.5000 mg | ORAL_TABLET | Freq: Every day | ORAL | 1 refills | Status: DC

## 2016-08-25 NOTE — Telephone Encounter (Signed)
New Message  Pt voiced would like to speak with nurse and would not go into detail.  Please f/u

## 2016-08-25 NOTE — Telephone Encounter (Signed)
Returned call to patient-reports his amlodipine was decreased from 5mg  to 2.5mg , therefore cutting the pill in half.  Reports the pill is so small it crumbles everytime he cuts it.  Patient asking for new rx with 2.5mg  tablets.    New rx sent to pharmacy for 2.5mg  tablets.    Patient aware and verbalized understanding.

## 2016-08-26 ENCOUNTER — Other Ambulatory Visit: Payer: Self-pay | Admitting: Internal Medicine

## 2016-08-26 DIAGNOSIS — E7849 Other hyperlipidemia: Secondary | ICD-10-CM

## 2016-08-26 DIAGNOSIS — Z79899 Other long term (current) drug therapy: Secondary | ICD-10-CM

## 2016-08-26 MED ORDER — SILDENAFIL CITRATE 100 MG PO TABS: 100.0000 mg | ORAL_TABLET | Freq: Every day | ORAL | 6 refills | Status: DC | PRN

## 2016-08-26 MED ORDER — ATORVASTATIN CALCIUM 20 MG PO TABS: 20.0000 mg | ORAL_TABLET | Freq: Every day | ORAL | 3 refills | Status: DC

## 2016-08-26 NOTE — Telephone Encounter (Signed)
Lm2cb rx was sent to sildenafil 100mg  #5-this is not enough medication to send to the mail in pharmacy(it is not 90 days).

## 2016-08-26 NOTE — Telephone Encounter (Signed)
Spoke with pt states that he can get sildenafil filled at mail in pharmacy for $7. [facility]   [facility]

## 2016-08-26 NOTE — Telephone Encounter (Signed)
Rx(s) sent to pharmacy electronically.  

## 2016-08-26 NOTE — Telephone Encounter (Signed)
Patient calling states that he would like his Viagra prescription switched to [place], [place]. The phone number is [phone]. Thanks.

## 2016-08-31 ENCOUNTER — Encounter: Payer: Self-pay | Admitting: Internal Medicine

## 2016-08-31 MED ORDER — SILDENAFIL CITRATE 100 MG PO TABS: 100.0000 mg | ORAL_TABLET | Freq: Every day | ORAL | 3 refills | Status: DC | PRN

## 2016-09-01 ENCOUNTER — Encounter: Payer: Self-pay | Admitting: Gastroenterology

## 2016-09-09 ENCOUNTER — Encounter: Payer: Self-pay | Admitting: Gastroenterology

## 2016-09-09 DIAGNOSIS — E784 Other hyperlipidemia: Secondary | ICD-10-CM | POA: Diagnosis not present

## 2016-09-09 DIAGNOSIS — Z79899 Other long term (current) drug therapy: Secondary | ICD-10-CM | POA: Diagnosis not present

## 2016-09-09 LAB — HEPATIC FUNCTION PANEL
ALBUMIN: 4.3 g/dL (ref 3.6–5.1)
ALK PHOS: 87 U/L (ref 40–115)
ALT: 12 U/L (ref 9–46)
AST: 17 U/L (ref 10–35)
Bilirubin, Direct: 0.2 mg/dL (ref <=0.2)
Indirect Bilirubin: 0.5 mg/dL (ref 0.2–1.2)
TOTAL PROTEIN: 7.7 g/dL (ref 6.1–8.1)
Total Bilirubin: 0.7 mg/dL (ref 0.2–1.2)

## 2016-09-18 ENCOUNTER — Encounter: Payer: Self-pay | Admitting: Internal Medicine

## 2016-09-26 ENCOUNTER — Other Ambulatory Visit: Payer: Self-pay | Admitting: Internal Medicine

## 2016-10-20 ENCOUNTER — Ambulatory Visit (AMBULATORY_SURGERY_CENTER): Payer: Self-pay | Admitting: *Deleted

## 2016-10-20 VITALS — Ht 74.0 in | Wt 222.0 lb

## 2016-10-20 DIAGNOSIS — Z1211 Encounter for screening for malignant neoplasm of colon: Secondary | ICD-10-CM

## 2016-10-20 MED ORDER — NA SULFATE-K SULFATE-MG SULF 17.5-3.13-1.6 GM/177ML PO SOLN: ORAL | 0 refills | Status: DC

## 2016-10-20 NOTE — Progress Notes (Signed)
Pt denies allergies to eggs or soy products. Denies difficulty with sedation or anesthesia. Denies any diet or weight loss medications. Denies use of supplemental oxygen.  Emmi instructions not given. 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

## 2016-11-03 ENCOUNTER — Encounter: Payer: Self-pay | Admitting: Gastroenterology

## 2016-11-03 ENCOUNTER — Ambulatory Visit (AMBULATORY_SURGERY_CENTER): Payer: Medicare Other | Admitting: Gastroenterology

## 2016-11-03 VITALS — BP 109/66 | HR 52 | Temp 97.1°F | Resp 26 | Ht 74.0 in | Wt 222.0 lb

## 2016-11-03 DIAGNOSIS — Z1212 Encounter for screening for malignant neoplasm of rectum: Secondary | ICD-10-CM | POA: Diagnosis not present

## 2016-11-03 DIAGNOSIS — Z1211 Encounter for screening for malignant neoplasm of colon: Secondary | ICD-10-CM | POA: Diagnosis not present

## 2016-11-03 DIAGNOSIS — K5730 Diverticulosis of large intestine without perforation or abscess without bleeding: Secondary | ICD-10-CM

## 2016-11-03 MED ORDER — SODIUM CHLORIDE 0.9 % IV SOLN: 500.0000 mL | INTRAVENOUS | Status: DC

## 2016-11-03 NOTE — Patient Instructions (Signed)
Discharge instructions given. Handouts on diverticulosis and a high fiber diet. Resume previous medications. YOU HAD AN ENDOSCOPIC PROCEDURE TODAY AT THE [facility]:   Refer to the procedure report that was given to you for any specific questions about what was found during the examination.  If the procedure report does not answer your questions, please call your gastroenterologist to clarify.  If you requested that your care partner not be given the details of your procedure findings, then the procedure report has been included in a sealed envelope for you to review at your convenience later.  YOU SHOULD EXPECT: Some feelings of bloating in the abdomen. Passage of more gas than usual.  Walking can help get rid of the air that was put into your GI tract during the procedure and reduce the bloating. If you had a lower endoscopy (such as a colonoscopy or flexible sigmoidoscopy) you may notice spotting of blood in your stool or on the toilet paper. If you underwent a bowel prep for your procedure, you may not have a normal bowel movement for a few days.  Please Note:  You might notice some irritation and congestion in your nose or some drainage.  This is from the oxygen used during your procedure.  There is no need for concern and it should clear up in a day or so.  SYMPTOMS TO REPORT IMMEDIATELY:   Following lower endoscopy (colonoscopy or flexible sigmoidoscopy):  Excessive amounts of blood in the stool  Significant tenderness or worsening of abdominal pains  Swelling of the abdomen that is new, acute  Fever of 100F or higher   For urgent or emergent issues, a gastroenterologist can be reached at any hour by calling [phone].   DIET:  We do recommend a small meal at first, but then you may proceed to your regular diet.  Drink plenty of fluids but you should avoid alcoholic beverages for 24 hours.  ACTIVITY:  You should plan to take it easy for the rest of today and you  should NOT DRIVE or use heavy machinery until tomorrow (because of the sedation medicines used during the test).    FOLLOW UP: Our staff will call the number listed on your records the next business day following your procedure to check on you and address any questions or concerns that you may have regarding the information given to you following your procedure. If we do not reach you, we will leave a message.  However, if you are feeling well and you are not experiencing any problems, there is no need to return our call.  We will assume that you have returned to your regular daily activities without incident.  If any biopsies were taken you will be contacted by phone or by letter within the next 1-3 weeks.  Please call us at [phone] if you have not heard about the biopsies in 3 weeks.    SIGNATURES/CONFIDENTIALITY: You and/or your care partner have signed paperwork which will be entered into your electronic medical record.  These signatures attest to the fact that that the information above on your After Visit Summary has been reviewed and is understood.  Full responsibility of the confidentiality of this discharge information lies with you and/or your care-partner. 

## 2016-11-03 NOTE — Progress Notes (Signed)
Spontaneous respirations throughout. VSS. Resting comfortably. To PACU on room air. Report to  Celia RN. 

## 2016-11-03 NOTE — Op Note (Signed)
[place] Patient Name: Adam Peters Procedure Date: 11/03/2016 10:54 AM MRN: [id number] Endoscopist: Milus Banister , MD Age: 66 Referring MD:  Date of Birth: 1951-02-02 Gender: Male Account #: [id number] Procedure:                Colonoscopy Indications:              Screening for colorectal malignant neoplasm Medicines:                Monitored Anesthesia Care Procedure:                Pre-Anesthesia Assessment:                           - Prior to the procedure, a History and Physical                            was performed, and patient medications and                            allergies were reviewed. The patient's tolerance of                            previous anesthesia was also reviewed. The risks                            and benefits of the procedure and the sedation                            options and risks were discussed with the patient.                            All questions were answered, and informed consent                            was obtained. Prior Anticoagulants: The patient has                            taken no previous anticoagulant or antiplatelet                            agents. ASA Grade Assessment: II - A patient with                            mild systemic disease. After reviewing the risks                            and benefits, the patient was deemed in                            satisfactory condition to undergo the procedure.                           After obtaining informed consent, the colonoscope  was passed under direct vision. Throughout the                            procedure, the patient's blood pressure, pulse, and                            oxygen saturations were monitored continuously. The                            Colonoscope was introduced through the anus and                            advanced to the the cecum, identified by                            appendiceal orifice  and ileocecal valve. The                            colonoscopy was performed without difficulty. The                            patient tolerated the procedure well. The quality                            of the bowel preparation was excellent. The                            ileocecal valve, appendiceal orifice, and rectum                            were photographed. Scope In: 11:51:33 AM Scope Out: 12:04:14 PM Scope Withdrawal Time: 0 hours 10 minutes 49 seconds  Total Procedure Duration: 0 hours 12 minutes 41 seconds  Findings:                 Multiple small-mouthed diverticula were found in                            the entire colon.                           The exam was otherwise without abnormality on                            direct and retroflexion views. Complications:            No immediate complications. Estimated Blood Loss:     Estimated blood loss: none. Impression:               - Diverticulosis in the entire examined colon.                           - The examination was otherwise normal on direct                            and retroflexion views.                           -  No polyps or cancers. Recommendation:           - Patient has a contact number available for                            emergencies. The signs and symptoms of potential                            delayed complications were discussed with the                            patient. Return to normal activities tomorrow.                            Written discharge instructions were provided to the                            patient.                           - Resume previous diet.                           - Continue present medications.                           - Repeat colonoscopy in 10 years for screening                            purposes. Milus Banister, MD 11/03/2016 12:06:25 PM This report has been signed electronically.

## 2016-11-03 NOTE — Progress Notes (Signed)
Pt's states no medical or surgical changes since previsit or office visit. 

## 2016-11-04 ENCOUNTER — Telehealth: Payer: Self-pay | Admitting: *Deleted

## 2016-11-04 NOTE — Telephone Encounter (Signed)
  Follow up Call-  Call back number 11/03/2016  Post procedure Call Back phone  # [phone]  Permission to leave phone message Yes  Some recent data might be hidden     Patient questions:  Do you have a fever, pain , or abdominal swelling? No. Pain Score  0 *  Have you tolerated food without any problems? Yes.    Have you been able to return to your normal activities? Yes.    Do you have any questions about your discharge instructions: Diet   No. Medications  No. Follow up visit  No.  Do you have questions or concerns about your Care? No.  Actions: * If pain score is 4 or above: No action needed, pain <4.

## 2016-11-16 ENCOUNTER — Encounter: Payer: Self-pay | Admitting: Emergency Medicine

## 2016-11-16 ENCOUNTER — Ambulatory Visit (INDEPENDENT_AMBULATORY_CARE_PROVIDER_SITE_OTHER): Payer: Medicare Other | Admitting: Emergency Medicine

## 2016-11-16 VITALS — BP 127/72 | HR 61 | Temp 98.2°F | Resp 16 | Ht 74.5 in | Wt 218.4 lb

## 2016-11-16 DIAGNOSIS — J209 Acute bronchitis, unspecified: Secondary | ICD-10-CM | POA: Diagnosis not present

## 2016-11-16 DIAGNOSIS — I2510 Atherosclerotic heart disease of native coronary artery without angina pectoris: Secondary | ICD-10-CM

## 2016-11-16 DIAGNOSIS — R05 Cough: Secondary | ICD-10-CM

## 2016-11-16 DIAGNOSIS — R059 Cough, unspecified: Secondary | ICD-10-CM

## 2016-11-16 MED ORDER — AZITHROMYCIN 250 MG PO TABS: ORAL_TABLET | ORAL | 0 refills | Status: DC

## 2016-11-16 NOTE — Patient Instructions (Addendum)
     IF you received an x-ray today, you will receive an invoice from [facility]. Please contact [facility] at [phone] with questions or concerns regarding your invoice.   IF you received labwork today, you will receive an invoice from [facility]. Please contact [facility] at [phone] with questions or concerns regarding your invoice.   Our billing staff will not be able to assist you with questions regarding bills from these companies.  You will be contacted with the lab results as soon as they are available. The fastest way to get your results is to activate your My Chart account. Instructions are located on the last page of this paperwork. If you have not heard from us regarding the results in 2 weeks, please contact this office.      Acute Bronchitis, Adult Acute bronchitis is when air tubes (bronchi) in the lungs suddenly get swollen. The condition can make it hard to breathe. It can also cause these symptoms:  A cough.  Coughing up clear, yellow, or green mucus.  Wheezing.  Chest congestion.  Shortness of breath.  A fever.  Body aches.  Chills.  A sore throat.  Follow these instructions at home: Medicines  Take over-the-counter and prescription medicines only as told by your doctor.  If you were prescribed an antibiotic medicine, take it as told by your doctor. Do not stop taking the antibiotic even if you start to feel better. General instructions  Rest.  Drink enough fluids to keep your pee (urine) clear or pale yellow.  Avoid smoking and secondhand smoke. If you smoke and you need help quitting, ask your doctor. Quitting will help your lungs heal faster.  Use an inhaler, cool mist vaporizer, or humidifier as told by your doctor.  Keep all follow-up visits as told by your doctor. This is important. How is this prevented? To lower your risk of getting this condition again:  Wash your hands often with soap and water. If you cannot  use soap and water, use hand sanitizer.  Avoid contact with people who have cold symptoms.  Try not to touch your hands to your mouth, nose, or eyes.  Make sure to get the flu shot every year.  Contact a doctor if:  Your symptoms do not get better in 2 weeks. Get help right away if:  You cough up blood.  You have chest pain.  You have very bad shortness of breath.  You become dehydrated.  You faint (pass out) or keep feeling like you are going to pass out.  You keep throwing up (vomiting).  You have a very bad headache.  Your fever or chills gets worse. This information is not intended to replace advice given to you by your health care provider. Make sure you discuss any questions you have with your health care provider. Document Released: 10/21/2007 Document Revised: 12/11/2015 Document Reviewed: 10/23/2015 Elsevier Interactive Patient Education  2017 Elsevier Inc.  

## 2016-11-16 NOTE — Progress Notes (Signed)
Adam Peters Reading 66 y.o.   Chief Complaint  Patient presents with  . Cough    ALL SXs - 2 1/2 - 3 weeks  . CHEST CONGESTION    HISTORY OF PRESENT ILLNESS: This is a 66 y.o. male complaining of 2-3 weeks of productive cough.  Cough  This is a new problem. The current episode started 1 to 4 weeks ago. The problem has been waxing and waning. The problem occurs constantly. The cough is productive of sputum. Associated symptoms include wheezing. Pertinent negatives include no chest pain, chills, fever, headaches, hemoptysis, nasal congestion, rash, rhinorrhea, sore throat, shortness of breath or sweats. His past medical history is significant for COPD.     Prior to Admission medications   Medication Sig Start Date End Date Taking? Authorizing Provider  amLODipine (NORVASC) 2.5 MG tablet Take 1 tablet (2.5 mg total) by mouth daily. 08/25/16  Yes [name], Nadean Corwin, MD  aspirin 81 MG tablet Take 81 mg by mouth daily.    Yes [provider]  atorvastatin (LIPITOR) 20 MG tablet Take 1 tablet (20 mg total) by mouth daily. 08/26/16  Yes [name], Nadean Corwin, MD  buPROPion (WELLBUTRIN) 75 MG tablet Take 75 mg by mouth 2 (two) times daily.   Yes [provider]  calcipotriene (DOVONOX) 0.005 % cream Apply topically 2 (two) times daily.   Yes [provider]  cetirizine (ZYRTEC) 10 MG tablet Take 10 mg by mouth at bedtime.   Yes [provider]  dorzolamide-timolol (COSOPT) 22.3-6.8 MG/ML ophthalmic solution Place 1 drop into both eyes 2 (two) times daily. 05/03/13  Yes [provider]  irbesartan (AVAPRO) 300 MG tablet Take 1 tablet (300 mg total) by mouth daily. 08/13/16  Yes [name], Nadean Corwin, MD  nitroGLYCERIN (NITROSTAT) 0.4 MG SL tablet Place 1 tablet (0.4 mg total) under the tongue every 5 (five) minutes as needed for chest pain. 08/24/16  Yes [name], Nadean Corwin, MD  omeprazole (PRILOSEC) 20 MG capsule TAKE 2 CAPSULES TWICE A DAY BEFORE MEALS 08/26/16  Yes  [name], Nadean Corwin, MD  sildenafil (VIAGRA) 100 MG tablet Take 1 tablet (100 mg total) by mouth daily as needed for erectile dysfunction. 08/31/16  Yes [name], Nadean Corwin, MD  NONFORMULARY OR COMPOUNDED ITEM Asamax inhaler Take 2 puffs before bedtime daily    [provider]    Allergies  Allergen Reactions  . Amoxicillin Diarrhea    GI upset.    Patient Active Problem List   Diagnosis Date Noted  . DOE (dyspnea on exertion) 01/23/2016  . Bradycardia 01/23/2016  . Injury of left rotator cuff 04/25/2014  . Essential hypertension   . Hyperlipidemia   . Acute myocardial infarction, subendocardial infarction, initial episode of care ([place]) 08/02/2013  . NSTEMI - 08/01/12 (Troponin 0.21) 08/01/2013  . Chest pain - CCS 3 08/01/2013  . History of tobacco use 07/08/2012  . Glaucoma 04/08/2012  . DDD (degenerative disc disease), cervical   . CAD- LAD BMS '97, RCA DES 08/02/13 08/17/2011    Past Medical History:  Diagnosis Date  . Allergy    seasonal  . Anemia   . Anxiety   . CAD (coronary artery disease)    a. 1997 PCI/BMS to distal LAD (J&J PS BMS);  b. 03/2011 Neg MV;  c. 07/2013 NSTEMI/PCI: LM 20d, LAD 42m, 50d isr, LCX nl, OM1/2 nl, RCA dom 95-20m (3.5x16 Promus premier DES), EF 55%, mild basal inf HK.  . Cataract   . Coronary artery disease  s/p PCI to LAD (1997) - Dr. Loni Muse. [place]  . GERD (gastroesophageal reflux disease)   . Hepatitis C   . History of tobacco use   . Hyperlipidemia   . Hypertension   . Schizophrenia ([place]) 1978    Past Surgical History:  Procedure Laterality Date  . APPENDECTOMY  1965  . CARDIAC CATHETERIZATION  2001   patent LAD stent (Dr Georges Lynch)  . CORONARY ANGIOPLASTY WITH STENT PLACEMENT  06/09/1995   prox LAD stenting 80% to 0% (Dr. Rockne Menghini) - repeat cath in 11/1999 showed no significant coronary disease & normal systolic function (Dr. Gerrie Nordmann)  . CORONARY STENT PLACEMENT  08/02/2013   RCA   DES         DR COOPER  . HERNIA REPAIR  1994  .  knife wound repair  1969  . LEFT HEART CATHETERIZATION WITH CORONARY ANGIOGRAM N/A 08/02/2013   Procedure: LEFT HEART CATHETERIZATION WITH CORONARY ANGIOGRAM;  Surgeon: Blane Ohara, MD;  Location: [facility] CATH LAB;  Service: Cardiovascular;  Laterality: N/A;  . TRANSTHORACIC ECHOCARDIOGRAM  11/13/2008   EF 50-55%, normal LV systolic function; RA mildly dilated; trace MR/TR/PR    Social History   Social History  . Marital status: Married    Spouse name: N/A  . Number of children: N/A  . Years of education: B.A.   Occupational History  .  [employer]   Social History Main Topics  . Smoking status: Former Smoker    Packs/day: 1.00    Types: Cigarettes    Quit date: 05/18/1993  . Smokeless tobacco: Never Used  . Alcohol use 1.8 oz/week    3 Glasses of wine per week     Comment: SOCIAL  . Drug use: No  . Sexual activity: Not on file   Other Topics Concern  . Not on file   Social History Narrative  . No narrative on file    Family History  Problem Relation Age of Onset  . Asthma Mother   . Crohn's disease Mother   . Heart disease Mother   . Hepatitis C Brother   . Testicular cancer Brother   . Dementia Father   . Diabetes Maternal Grandmother   . Emphysema Maternal Aunt   . Cancer Maternal Aunt   . Colon cancer Neg Hx      Review of Systems  Constitutional: Negative.  Negative for chills and fever.  HENT: Negative.  Negative for rhinorrhea and sore throat.   Eyes: Negative.   Respiratory: Positive for cough and wheezing. Negative for hemoptysis and shortness of breath.   Cardiovascular: Negative for chest pain and palpitations.  Gastrointestinal: Negative.  Negative for abdominal pain, diarrhea, nausea and vomiting.  Genitourinary: Negative.  Negative for dysuria and hematuria.       Loss of libido and sex drive; states he tested low for testosterone.  Skin: Negative.  Negative for rash.  Neurological: Negative for dizziness, sensory change, focal weakness and  headaches.  Endo/Heme/Allergies: Negative.   All other systems reviewed and are negative.    Vitals:   11/16/16 1148  BP: 127/72  Pulse: 61  Resp: 16  Temp: 98.2 F (36.8 C)     Physical Exam  Constitutional: He is oriented to person, place, and time. He appears well-developed and well-nourished.  HENT:  Head: Normocephalic and atraumatic.  Right Ear: External ear normal.  Left Ear: External ear normal.  Mouth/Throat: Oropharynx is clear and moist.  Eyes: Conjunctivae and EOM are normal. Pupils  are equal, round, and reactive to light.  Neck: Normal range of motion. Neck supple. No JVD present.  Cardiovascular: Normal rate, regular rhythm and normal heart sounds.   Pulmonary/Chest: Effort normal and breath sounds normal.  Abdominal: Soft. Bowel sounds are normal. He exhibits no distension. There is no tenderness.  Musculoskeletal: Normal range of motion.  Lymphadenopathy:    He has no cervical adenopathy.  Neurological: He is alert and oriented to person, place, and time. No sensory deficit. He exhibits normal muscle tone.  Skin: Skin is warm and dry. Capillary refill takes less than 2 seconds. No rash noted.  Psychiatric: He has a normal mood and affect. His behavior is normal.  Vitals reviewed.    ASSESSMENT & PLAN: Efrem was seen today for cough and chest congestion.  Diagnoses and all orders for this visit:  Acute bronchitis, unspecified organism  Cough  Other orders -     azithromycin (ZITHROMAX) 250 MG tablet; Sig as indicated    Patient Instructions       IF you received an x-ray today, you will receive an invoice from [facility]. Please contact [facility] at [phone] with questions or concerns regarding your invoice.   IF you received labwork today, you will receive an invoice from [facility]. Please contact [facility] at [phone] with questions or concerns regarding your invoice.   Our billing staff will not be able to  assist you with questions regarding bills from these companies.  You will be contacted with the lab results as soon as they are available. The fastest way to get your results is to activate your My Chart account. Instructions are located on the last page of this paperwork. If you have not heard from [place] regarding the results in 2 weeks, please contact this office.     Acute Bronchitis, Adult Acute bronchitis is when air tubes (bronchi) in the lungs suddenly get swollen. The condition can make it hard to breathe. It can also cause these symptoms:  A cough.  Coughing up clear, yellow, or green mucus.  Wheezing.  Chest congestion.  Shortness of breath.  A fever.  Body aches.  Chills.  A sore throat.  Follow these instructions at home: Medicines  Take over-the-counter and prescription medicines only as told by your doctor.  If you were prescribed an antibiotic medicine, take it as told by your doctor. Do not stop taking the antibiotic even if you start to feel better. General instructions  Rest.  Drink enough fluids to keep your pee (urine) clear or pale yellow.  Avoid smoking and secondhand smoke. If you smoke and you need help quitting, ask your doctor. Quitting will help your lungs heal faster.  Use an inhaler, cool mist vaporizer, or humidifier as told by your doctor.  Keep all follow-up visits as told by your doctor. This is important. How is this prevented? To lower your risk of getting this condition again:  Wash your hands often with soap and water. If you cannot use soap and water, use hand sanitizer.  Avoid contact with people who have cold symptoms.  Try not to touch your hands to your mouth, nose, or eyes.  Make sure to get the flu shot every year.  Contact a doctor if:  Your symptoms do not get better in 2 weeks. Get help right away if:  You cough up blood.  You have chest pain.  You have very bad shortness of breath.  You become  dehydrated.  You faint (pass out)  or keep feeling like you are going to pass out.  You keep throwing up (vomiting).  You have a very bad headache.  Your fever or chills gets worse. This information is not intended to replace advice given to you by your health care provider. Make sure you discuss any questions you have with your health care provider. Document Released: 10/21/2007 Document Revised: 12/11/2015 Document Reviewed: 10/23/2015 Elsevier Interactive Patient Education  2017 Elsevier Inc.      Agustina Caroli, MD [facility]

## 2016-11-23 ENCOUNTER — Encounter: Payer: Self-pay | Admitting: Emergency Medicine

## 2016-11-23 ENCOUNTER — Ambulatory Visit (INDEPENDENT_AMBULATORY_CARE_PROVIDER_SITE_OTHER): Payer: Medicare Other | Admitting: Emergency Medicine

## 2016-11-23 VITALS — BP 129/72 | HR 62 | Temp 97.4°F | Resp 16 | Ht 74.41 in | Wt 218.4 lb

## 2016-11-23 DIAGNOSIS — R05 Cough: Secondary | ICD-10-CM | POA: Diagnosis not present

## 2016-11-23 DIAGNOSIS — R059 Cough, unspecified: Secondary | ICD-10-CM | POA: Insufficient documentation

## 2016-11-23 DIAGNOSIS — J209 Acute bronchitis, unspecified: Secondary | ICD-10-CM | POA: Insufficient documentation

## 2016-11-23 DIAGNOSIS — I2510 Atherosclerotic heart disease of native coronary artery without angina pectoris: Secondary | ICD-10-CM | POA: Diagnosis not present
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
## 2016-11-23 NOTE — Patient Instructions (Addendum)
pc    IF you received an x-ray today, you will receive an invoice from [facility]. Please contact [facility] at [phone] with questions or concerns regarding your invoice.   IF you received labwork today, you will receive an invoice from [facility]. Please contact [facility] at [phone] with questions or concerns regarding your invoice.   Our billing staff will not be able to assist you with questions regarding bills from these companies.  You will be contacted with the lab results as soon as they are available. The fastest way to get your results is to activate your My Chart account. Instructions are located on the last page of this paperwork. If you have not heard from [place] regarding the results in 2 weeks, please contact this office.      Acute Bronchitis, Adult Acute bronchitis is when air tubes (bronchi) in the lungs suddenly get swollen. The condition can make it hard to breathe. It can also cause these symptoms:  A cough.  Coughing up clear, yellow, or green mucus.  Wheezing.  Chest congestion.  Shortness of breath.  A fever.  Body aches.  Chills.  A sore throat.  Follow these instructions at home: Medicines  Take over-the-counter and prescription medicines only as told by your doctor.  If you were prescribed an antibiotic medicine, take it as told by your doctor. Do not stop taking the antibiotic even if you start to feel better. General instructions  Rest.  Drink enough fluids to keep your pee (urine) clear or pale yellow.  Avoid smoking and secondhand smoke. If you smoke and you need help quitting, ask your doctor. Quitting will help your lungs heal faster.  Use an inhaler, cool mist vaporizer, or humidifier as told by your doctor.  Keep all follow-up visits as told by your doctor. This is important. How is this prevented? To lower your risk of getting this condition again:  Wash your hands often with soap and water. If you  cannot use soap and water, use hand sanitizer.  Avoid contact with people who have cold symptoms.  Try not to touch your hands to your mouth, nose, or eyes.  Make sure to get the flu shot every year.  Contact a doctor if:  Your symptoms do not get better in 2 weeks. Get help right away if:  You cough up blood.  You have chest pain.  You have very bad shortness of breath.  You become dehydrated.  You faint (pass out) or keep feeling like you are going to pass out.  You keep throwing up (vomiting).  You have a very bad headache.  Your fever or chills gets worse. This information is not intended to replace advice given to you by your health care provider. Make sure you discuss any questions you have with your health care provider. Document Released: 10/21/2007 Document Revised: 12/11/2015 Document Reviewed: 10/23/2015 Elsevier Interactive Patient Education  2017 [employer].

## 2016-12-03 ENCOUNTER — Other Ambulatory Visit: Payer: Self-pay | Admitting: Internal Medicine

## 2016-12-03 DIAGNOSIS — H2513 Age-related nuclear cataract, bilateral: Secondary | ICD-10-CM | POA: Diagnosis not present

## 2016-12-03 DIAGNOSIS — H401131 Primary open-angle glaucoma, bilateral, mild stage: Secondary | ICD-10-CM | POA: Diagnosis not present

## 2016-12-03 NOTE — Telephone Encounter (Signed)
REFILL 

## 2017-01-19 ENCOUNTER — Encounter: Payer: Self-pay | Admitting: Emergency Medicine

## 2017-01-19 ENCOUNTER — Ambulatory Visit (INDEPENDENT_AMBULATORY_CARE_PROVIDER_SITE_OTHER): Payer: Medicare Other | Admitting: Emergency Medicine

## 2017-01-19 ENCOUNTER — Other Ambulatory Visit: Payer: Self-pay | Admitting: Internal Medicine

## 2017-01-19 VITALS — BP 116/67 | HR 83 | Temp 97.6°F | Resp 18 | Ht 74.25 in | Wt 213.0 lb

## 2017-01-19 DIAGNOSIS — Z0271 Encounter for disability determination: Secondary | ICD-10-CM

## 2017-01-19 NOTE — Telephone Encounter (Signed)
Rx(s) sent to pharmacy electronically.  

## 2017-01-19 NOTE — Patient Instructions (Signed)
     IF you received an x-ray today, you will receive an invoice from [facility]. Please contact [facility] at [phone] with questions or concerns regarding your invoice.   IF you received labwork today, you will receive an invoice from [facility]. Please contact [facility] at [phone] with questions or concerns regarding your invoice.   Our billing staff will not be able to assist you with questions regarding bills from these companies.  You will be contacted with the lab results as soon as they are available. The fastest way to get your results is to activate your My Chart account. Instructions are located on the last page of this paperwork. If you have not heard from us regarding the results in 2 weeks, please contact this office.     

## 2017-01-20 NOTE — Progress Notes (Signed)
Not seen by me. Nurse encounter.

## 2017-02-04 ENCOUNTER — Other Ambulatory Visit: Payer: Self-pay | Admitting: *Deleted

## 2017-02-04 MED ORDER — AMLODIPINE BESYLATE 2.5 MG PO TABS: 2.5000 mg | ORAL_TABLET | Freq: Every day | ORAL | 0 refills | Status: DC

## 2017-02-19 ENCOUNTER — Other Ambulatory Visit: Payer: Self-pay | Admitting: Internal Medicine

## 2017-02-19 ENCOUNTER — Other Ambulatory Visit: Payer: Self-pay

## 2017-02-19 MED ORDER — AMLODIPINE BESYLATE 2.5 MG PO TABS: 2.5000 mg | ORAL_TABLET | Freq: Every day | ORAL | 0 refills | Status: DC

## 2017-02-22 ENCOUNTER — Ambulatory Visit (INDEPENDENT_AMBULATORY_CARE_PROVIDER_SITE_OTHER): Payer: Medicare Other | Admitting: Internal Medicine

## 2017-02-22 VITALS — BP 128/70 | HR 70 | Ht 74.25 in | Wt 215.0 lb

## 2017-02-22 DIAGNOSIS — I2510 Atherosclerotic heart disease of native coronary artery without angina pectoris: Secondary | ICD-10-CM | POA: Diagnosis not present

## 2017-02-22 DIAGNOSIS — I10 Essential (primary) hypertension: Secondary | ICD-10-CM

## 2017-02-22 DIAGNOSIS — E7849 Other hyperlipidemia: Secondary | ICD-10-CM
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
## 2017-02-22 NOTE — Patient Instructions (Signed)
Your physician recommends that you return for lab work FASTING to check cholesterol   Your physician wants you to follow-up in: ONE YEAR with Dr. [name]. You will receive a reminder letter in the mail two months in advance. If you don't receive a letter, please call our office to schedule the follow-up appointment.   

## 2017-02-25 ENCOUNTER — Encounter: Payer: Self-pay | Admitting: Internal Medicine

## 2017-02-25 DIAGNOSIS — E7849 Other hyperlipidemia: Secondary | ICD-10-CM | POA: Diagnosis not present

## 2017-02-25 LAB — LIPID PANEL
Chol/HDL Ratio: 2.6 ratio (ref 0.0–5.0)
Cholesterol, Total: 112 mg/dL (ref 100–199)
HDL: 43 mg/dL (ref >39)
LDL Calculated: 59 mg/dL (ref 0–99)
TRIGLYCERIDES: 49 mg/dL (ref 0–149)
VLDL Cholesterol Cal: 10 mg/dL (ref 5–40)

## 2017-03-01 ENCOUNTER — Encounter: Payer: Self-pay | Admitting: Internal Medicine

## 2017-03-25 ENCOUNTER — Telehealth: Payer: Self-pay | Admitting: Internal Medicine

## 2017-03-25 NOTE — Telephone Encounter (Signed)
New message    Patient calling, received letter regarding recall on irbesartan (AVAPRO) 300 MG tablet.  Pt c/o medication issue:  1. Name of Medication: irbesartan (AVAPRO) 300 MG tablet  2. How are you currently taking this medication (dosage and times per day)? As prescribed  3. Are you having a reaction (difficulty breathing--STAT)? no  4. What is your medication issue? Recall on medication, request medication change

## 2017-03-26 ENCOUNTER — Telehealth: Payer: Self-pay | Admitting: Internal Medicine

## 2017-03-26 MED ORDER — LISINOPRIL 40 MG PO TABS
40.0000 mg | ORAL_TABLET | Freq: Every day | ORAL | 3 refills | Status: DC
Start: 2017-03-26 — End: 2017-09-24

## 2017-03-26 NOTE — Telephone Encounter (Signed)
Close this encounter,already one open

## 2017-03-26 NOTE — Telephone Encounter (Signed)
Patient has been made aware of the switch to Lisinopril and the prescription has been called in for him. He will check his blood pressure and call back if he has any concerns.

## 2017-03-26 NOTE — Telephone Encounter (Signed)
Pt calling ,wants tp know what to take in the place of his Irbesartan?

## 2017-03-26 NOTE — Telephone Encounter (Signed)
Pt calling,wants to know what he should take in the place of his Irbesartan?

## 2017-03-26 NOTE — Telephone Encounter (Signed)
Switch patient to lisinopril 40 mg qd.  Have him check home BP regularly for 2-3 weeks and call back if any concerns

## 2017-04-29 ENCOUNTER — Other Ambulatory Visit: Payer: Self-pay | Admitting: Internal Medicine

## 2017-05-20 ENCOUNTER — Ambulatory Visit (INDEPENDENT_AMBULATORY_CARE_PROVIDER_SITE_OTHER): Payer: Medicare Other | Admitting: Physician Assistant

## 2017-05-20 ENCOUNTER — Encounter: Payer: Self-pay | Admitting: Physician Assistant

## 2017-05-20 ENCOUNTER — Other Ambulatory Visit: Payer: Self-pay

## 2017-05-20 VITALS — BP 126/78 | HR 68 | Temp 98.2°F | Resp 16 | Ht 74.0 in | Wt 220.0 lb

## 2017-05-20 DIAGNOSIS — J0140 Acute pansinusitis, unspecified: Secondary | ICD-10-CM

## 2017-05-20 MED ORDER — DOXYCYCLINE HYCLATE 100 MG PO CAPS: 100.0000 mg | ORAL_CAPSULE | Freq: Two times a day (BID) | ORAL | 0 refills | Status: DC

## 2017-05-20 MED ORDER — IPRATROPIUM BROMIDE 0.03 % NA SOLN: 2.0000 | Freq: Two times a day (BID) | NASAL | 0 refills | Status: DC

## 2017-05-20 MED ORDER — GUAIFENESIN ER 1200 MG PO TB12: 1.0000 | ORAL_TABLET | Freq: Two times a day (BID) | ORAL | 1 refills | Status: DC | PRN
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
## 2017-05-20 NOTE — Patient Instructions (Addendum)
Make sure you are hydrating well with 64 oz of water per day at this time.   Take medication as prescribed.     Sinusitis, Adult Sinusitis is soreness and inflammation of your sinuses. Sinuses are hollow spaces in the bones around your face. They are located:  Around your eyes.  In the middle of your forehead.  Behind your nose.  In your cheekbones.  Your sinuses and nasal passages are lined with a stringy fluid (mucus). Mucus normally drains out of your sinuses. When your nasal tissues get inflamed or swollen, the mucus can get trapped or blocked so air cannot flow through your sinuses. This lets bacteria, viruses, and funguses grow, and that leads to infection. Follow these instructions at home: Medicines  Take, use, or apply over-the-counter and prescription medicines only as told by your doctor. These may include nasal sprays.  If you were prescribed an antibiotic medicine, take it as told by your doctor. Do not stop taking the antibiotic even if you start to feel better. Hydrate and Humidify  Drink enough water to keep your pee (urine) clear or pale yellow.  Use a cool mist humidifier to keep the humidity level in your home above 50%.  Breathe in steam for 10-15 minutes, 3-4 times a day or as told by your doctor. You can do this in the bathroom while a hot shower is running.  Try not to spend time in cool or dry air. Rest  Rest as much as possible.  Sleep with your head raised (elevated).  Make sure to get enough sleep each night. General instructions  Put a warm, moist washcloth on your face 3-4 times a day or as told by your doctor. This will help with discomfort.  Wash your hands often with soap and water. If there is no soap and water, use hand sanitizer.  Do not smoke. Avoid being around people who are smoking (secondhand smoke).  Keep all follow-up visits as told by your doctor. This is important. Contact a doctor if:  You have a fever.  Your symptoms get  worse.  Your symptoms do not get better within 10 days. Get help right away if:  You have a very bad headache.  You cannot stop throwing up (vomiting).  You have pain or swelling around your face or eyes.  You have trouble seeing.  You feel confused.  Your neck is stiff.  You have trouble breathing. This information is not intended to replace advice given to you by your health care provider. Make sure you discuss any questions you have with your health care provider. Document Released: 10/21/2007 Document Revised: 12/29/2015 Document Reviewed: 02/27/2015 Elsevier Interactive Patient Education  2018 [employer].   IF you received an x-ray today, you will receive an invoice from [facility]. Please contact [facility] at [phone] with questions or concerns regarding your invoice.   IF you received labwork today, you will receive an invoice from [facility]. Please contact [facility] at [phone] with questions or concerns regarding your invoice.   Our billing staff will not be able to assist you with questions regarding bills from these companies.  You will be contacted with the lab results as soon as they are available. The fastest way to get your results is to activate your My Chart account. Instructions are located on the last page of this paperwork. If you have not heard from [place] regarding the results in 2 weeks, please contact this office.

## 2017-05-27 ENCOUNTER — Telehealth: Payer: Self-pay

## 2017-05-27 NOTE — Telephone Encounter (Signed)
Copied from [place] [id number]. Topic: Quick Communication - See Telephone Encounter >> May 27, 2017  2:20 PM Adam Peters wrote: CRM for notification. See Telephone encounter for:  05/27/17. Patient wanted Adam Peters to know the mucus have slowed up a great deal.  He has 16 tablets left of the Doxycycline Hyclate.  He also has 16 sprays of the Ipratropium Bromide.  He has used all of his Mucinex tablets.

## 2017-05-31 ENCOUNTER — Other Ambulatory Visit: Payer: Self-pay | Admitting: Internal Medicine

## 2017-05-31 ENCOUNTER — Telehealth: Payer: Self-pay | Admitting: Physician Assistant

## 2017-05-31 ENCOUNTER — Ambulatory Visit: Payer: Medicare Other | Admitting: Physician Assistant

## 2017-05-31 NOTE — Telephone Encounter (Signed)
Pt stating he spoke to someone at the office today regarding getting a prescription filled for cough syrup. Pt does not know the name of the medication. No documentation of call .Last OV 05/20/17 with Ivar Drape, PA

## 2017-05-31 NOTE — Telephone Encounter (Signed)
See previous notes.

## 2017-05-31 NOTE — Telephone Encounter (Signed)
Copied from [place] [phone]. Topic: General - Call Back - No Documentation >> May 31, 2017  5:38 PM Conception Chancy, NT wrote: Reason for CRM: patient is calling back and states he just talked to someone at the office and they were supposed to send in a RX [facility] and he states it is not at the pharmacy. He does not even know the name of the medication or who he spoke with. Please advise. >> May 31, 2017  6:20 PM Neva Seat wrote: Pt is needing cough syrup filled that Dr told him would be called in to help w/ his cough.   [address] [place] [phone]  Pt number [phone]

## 2017-06-01 ENCOUNTER — Other Ambulatory Visit: Payer: Self-pay | Admitting: Physician Assistant

## 2017-06-01 ENCOUNTER — Ambulatory Visit: Payer: Medicare Other | Admitting: Family Medicine

## 2017-06-01 MED ORDER — BENZONATATE 100 MG PO CAPS: 100.0000 mg | ORAL_CAPSULE | Freq: Three times a day (TID) | ORAL | 0 refills | Status: DC | PRN

## 2017-06-01 NOTE — Telephone Encounter (Signed)
Filled for tessalon perles.  Please advise patient and location of pharmacy

## 2017-06-01 NOTE — Telephone Encounter (Signed)
Pt has f/u visit with [place] today for cough.

## 2017-06-01 NOTE — Telephone Encounter (Signed)
Pt called and notified that prescription medication for cough was sent to requested pharmacy. Pt verbalized understanding. Pt has appt scheduled with Stephanie,PA on 1/16.

## 2017-06-02 ENCOUNTER — Other Ambulatory Visit: Payer: Self-pay | Admitting: Physician Assistant

## 2017-06-02 ENCOUNTER — Ambulatory Visit: Payer: Medicare Other | Admitting: Physician Assistant

## 2017-06-02 DIAGNOSIS — R05 Cough: Secondary | ICD-10-CM

## 2017-06-02 DIAGNOSIS — R059 Cough, unspecified: Secondary | ICD-10-CM

## 2017-06-02 MED ORDER — HYDROCOD POLST-CPM POLST ER 10-8 MG/5ML PO SUER: 5.0000 mL | Freq: Every evening | ORAL | 0 refills | Status: DC | PRN

## 2017-06-04 DIAGNOSIS — H2513 Age-related nuclear cataract, bilateral: Secondary | ICD-10-CM | POA: Diagnosis not present

## 2017-06-04 DIAGNOSIS — H40013 Open angle with borderline findings, low risk, bilateral: Secondary | ICD-10-CM | POA: Diagnosis not present

## 2017-07-05 ENCOUNTER — Other Ambulatory Visit: Payer: Self-pay | Admitting: Internal Medicine

## 2017-07-26 ENCOUNTER — Other Ambulatory Visit: Payer: Self-pay

## 2017-07-26 DIAGNOSIS — J0140 Acute pansinusitis, unspecified: Secondary | ICD-10-CM

## 2017-07-26 MED ORDER — IPRATROPIUM BROMIDE 0.03 % NA SOLN: 2.0000 | Freq: Two times a day (BID) | NASAL | 0 refills | Status: DC

## 2017-08-03 ENCOUNTER — Other Ambulatory Visit: Payer: Self-pay | Admitting: Internal Medicine

## 2017-08-25 ENCOUNTER — Encounter: Payer: Self-pay | Admitting: Physician Assistant

## 2017-09-24 ENCOUNTER — Other Ambulatory Visit: Payer: Self-pay

## 2017-09-24 ENCOUNTER — Telehealth: Payer: Self-pay | Admitting: Emergency Medicine

## 2017-09-24 ENCOUNTER — Ambulatory Visit (INDEPENDENT_AMBULATORY_CARE_PROVIDER_SITE_OTHER): Payer: Medicare Other

## 2017-09-24 ENCOUNTER — Encounter: Payer: Self-pay | Admitting: Emergency Medicine

## 2017-09-24 ENCOUNTER — Ambulatory Visit (INDEPENDENT_AMBULATORY_CARE_PROVIDER_SITE_OTHER): Payer: Medicare Other | Admitting: Emergency Medicine

## 2017-09-24 VITALS — BP 112/68 | HR 60 | Temp 97.6°F | Ht 74.25 in | Wt 217.0 lb

## 2017-09-24 DIAGNOSIS — T464X5A Adverse effect of angiotensin-converting-enzyme inhibitors, initial encounter: Secondary | ICD-10-CM | POA: Diagnosis not present

## 2017-09-24 DIAGNOSIS — R05 Cough: Secondary | ICD-10-CM

## 2017-09-24 DIAGNOSIS — R059 Cough, unspecified: Secondary | ICD-10-CM

## 2017-09-24 DIAGNOSIS — I10 Essential (primary) hypertension: Secondary | ICD-10-CM

## 2017-09-24 DIAGNOSIS — R058 Other specified cough: Secondary | ICD-10-CM

## 2017-09-24 MED ORDER — OLMESARTAN MEDOXOMIL 20 MG PO TABS: 20.0000 mg | ORAL_TABLET | Freq: Every day | ORAL | 0 refills | Status: DC

## 2017-09-24 MED ORDER — LISINOPRIL 40 MG PO TABS: 40.0000 mg | ORAL_TABLET | Freq: Every day | ORAL | 3 refills | Status: DC

## 2017-09-24 MED ORDER — BECLOMETHASONE DIPROP HFA 40 MCG/ACT IN AERB: 1.0000 | INHALATION_SPRAY | Freq: Two times a day (BID) | RESPIRATORY_TRACT | 11 refills | Status: DC

## 2017-09-24 MED ORDER — OLMESARTAN MEDOXOMIL 20 MG PO TABS: 20.0000 mg | ORAL_TABLET | Freq: Every day | ORAL | 3 refills | Status: DC

## 2017-09-24 NOTE — Telephone Encounter (Signed)
Copied from [place]  [phone]. Topic: Quick Communication - Rx Refill/Question >> Sep 24, 2017  5:17 PM Waylan Rocher, Lumin L wrote: Medication: olmesartan (BENICAR) 20 MG tablet, beclomethasone (QVAR REDIHALER) 40 MCG/ACT inhaler (90 days supply) Has the patient contacted their pharmacy? Yes.   (Agent: If no, request that the patient contact the pharmacy for the refill.) Preferred Pharmacy (with phone number or street name): [place], [place] [address] [place] [address] Phone: [phone] Fax: [phone] Agent: Please be advised that RX refills may take up to 3 business days. We ask that you follow-up with your pharmacy.   [facility]

## 2017-09-24 NOTE — Assessment & Plan Note (Signed)
Secondary to ACE inhibitor.  Will stop lisinopril and start Benicar 20 mg daily.  Will monitor and follow-up in 3 months.
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
## 2017-09-24 NOTE — Patient Instructions (Addendum)
     IF you received an x-ray today, you will receive an invoice from [facility]. Please contact [facility] at [phone] with questions or concerns regarding your invoice.   IF you received labwork today, you will receive an invoice from [facility]. Please contact [facility] at [phone] with questions or concerns regarding your invoice.   Our billing staff will not be able to assist you with questions regarding bills from these companies.  You will be contacted with the lab results as soon as they are available. The fastest way to get your results is to activate your My Chart account. Instructions are located on the last page of this paperwork. If you have not heard from us regarding the results in 2 weeks, please contact this office.     Cough, Adult A cough helps to clear your throat and lungs. A cough may last only 2-3 weeks (acute), or it may last longer than 8 weeks (chronic). Many different things can cause a cough. A cough may be a sign of an illness or another medical condition. Follow these instructions at home:  Pay attention to any changes in your cough.  Take medicines only as told by your doctor. ? If you were prescribed an antibiotic medicine, take it as told by your doctor. Do not stop taking it even if you start to feel better. ? Talk with your doctor before you try using a cough medicine.  Drink enough fluid to keep your pee (urine) clear or pale yellow.  If the air is dry, use a cold steam vaporizer or humidifier in your home.  Stay away from things that make you cough at work or at home.  If your cough is worse at night, try using extra pillows to raise your head up higher while you sleep.  Do not smoke, and try not to be around smoke. If you need help quitting, ask your doctor.  Do not have caffeine.  Do not drink alcohol.  Rest as needed. Contact a doctor if:  You have new problems (symptoms).  You cough up yellow fluid  (pus).  Your cough does not get better after 2-3 weeks, or your cough gets worse.  Medicine does not help your cough and you are not sleeping well.  You have pain that gets worse or pain that is not helped with medicine.  You have a fever.  You are losing weight and you do not know why.  You have night sweats. Get help right away if:  You cough up blood.  You have trouble breathing.  Your heartbeat is very fast. This information is not intended to replace advice given to you by your health care provider. Make sure you discuss any questions you have with your health care provider. Document Released: 01/15/2011 Document Revised: 10/10/2015 Document Reviewed: 07/11/2014 Elsevier Interactive Patient Education  2018 Elsevier Inc.  

## 2017-09-28 ENCOUNTER — Encounter: Payer: Self-pay | Admitting: Emergency Medicine

## 2017-09-28 ENCOUNTER — Encounter: Payer: Self-pay | Admitting: Internal Medicine

## 2017-09-29 ENCOUNTER — Telehealth: Payer: Self-pay | Admitting: *Deleted

## 2017-09-29 ENCOUNTER — Other Ambulatory Visit: Payer: Self-pay | Admitting: Internal Medicine

## 2017-09-29 ENCOUNTER — Other Ambulatory Visit: Payer: Self-pay

## 2017-09-29 MED ORDER — BECLOMETHASONE DIPROP HFA 40 MCG/ACT IN AERB: 1.0000 | INHALATION_SPRAY | Freq: Two times a day (BID) | RESPIRATORY_TRACT | 11 refills | Status: DC

## 2017-09-29 NOTE — Telephone Encounter (Signed)
Faxed Rx for IBREsartan tablets 150 mg to [facility], per Dr Mitchel Honour. Confirmation received at 5:04 pm.

## 2017-10-01 ENCOUNTER — Telehealth: Payer: Self-pay

## 2017-10-01 NOTE — Telephone Encounter (Signed)
Received PA for olmersartan - Medication cancelled and changed to ibresartan

## 2017-10-04 ENCOUNTER — Other Ambulatory Visit: Payer: Self-pay | Admitting: Emergency Medicine

## 2017-10-04 ENCOUNTER — Encounter: Payer: Self-pay | Admitting: Emergency Medicine

## 2017-10-04 NOTE — Telephone Encounter (Signed)
[phone] 

## 2017-10-04 NOTE — Telephone Encounter (Signed)
Not sure what is happening here.  I sent the prescription for Benicar to [facility] during the visit on 09/24/2017.  It was for 90 days with 3 refills.  Please look into this.  Thanks.

## 2017-10-07 ENCOUNTER — Encounter: Payer: Self-pay | Admitting: Internal Medicine

## 2017-10-08 ENCOUNTER — Encounter: Payer: Self-pay | Admitting: Emergency Medicine

## 2017-10-13 ENCOUNTER — Telehealth: Payer: Self-pay

## 2017-10-13 NOTE — Telephone Encounter (Signed)
Benicar is not covered by insurance without first trying Micardis, Ibersartan, or Losartan and showing failure of these medications. Please advise on change of medication for pt.

## 2017-10-13 NOTE — Telephone Encounter (Signed)
PA started for benicar. Burnett Harry Key: [id number] - PA Case ID: [id number]   Pt has tried ibersartan with side effects of side pain and not feeling well. Reported after PA started. Please resubmit if denial received.

## 2017-10-15 ENCOUNTER — Telehealth: Payer: Self-pay | Admitting: *Deleted

## 2017-10-15 NOTE — Telephone Encounter (Signed)
Faxed prescription for Candesartan 16 mg 90 days with 4 refills to [facility], per Dr Mitchel Honour. Confirmation page received at 5:50 pm.

## 2017-10-21 ENCOUNTER — Encounter: Payer: Self-pay | Admitting: Emergency Medicine

## 2017-10-21 ENCOUNTER — Encounter: Payer: Self-pay | Admitting: Internal Medicine

## 2017-11-09 ENCOUNTER — Encounter: Payer: Medicare Other | Admitting: Emergency Medicine

## 2017-11-10 ENCOUNTER — Ambulatory Visit (INDEPENDENT_AMBULATORY_CARE_PROVIDER_SITE_OTHER): Payer: Medicare Other | Admitting: Emergency Medicine

## 2017-11-10 ENCOUNTER — Encounter: Payer: Self-pay | Admitting: Emergency Medicine

## 2017-11-10 ENCOUNTER — Other Ambulatory Visit: Payer: Self-pay

## 2017-11-10 VITALS — BP 122/72 | HR 54 | Temp 97.9°F | Resp 16 | Ht 74.0 in | Wt 215.2 lb

## 2017-11-10 DIAGNOSIS — Z0000 Encounter for general adult medical examination without abnormal findings: Secondary | ICD-10-CM

## 2017-11-10 DIAGNOSIS — I10 Essential (primary) hypertension: Secondary | ICD-10-CM | POA: Diagnosis not present

## 2017-11-10 NOTE — Patient Instructions (Addendum)
   IF you received an x-ray today, you will receive an invoice from [facility]. Please contact  Radiology at [phone] with questions or concerns regarding your invoice.   IF you received labwork today, you will receive an invoice from [facility]. Please contact [facility] at [phone] with questions or concerns regarding your invoice.   Our billing staff will not be able to assist you with questions regarding bills from these companies.  You will be contacted with the lab results as soon as they are available. The fastest way to get your results is to activate your My Chart account. Instructions are located on the last page of this paperwork. If you have not heard from us regarding the results in 2 weeks, please contact this office.      Health Maintenance, Male A healthy lifestyle and preventive care is important for your health and wellness. Ask your health care provider about what schedule of regular examinations is right for you. What should I know about weight and diet? Eat a Healthy Diet  Eat plenty of vegetables, fruits, whole grains, low-fat dairy products, and lean protein.  Do not eat a lot of foods high in solid fats, added sugars, or salt.  Maintain a Healthy Weight Regular exercise can help you achieve or maintain a healthy weight. You should:  Do at least 150 minutes of exercise each week. The exercise should increase your heart rate and make you sweat (moderate-intensity exercise).  Do strength-training exercises at least twice a week.  Watch Your Levels of Cholesterol and Blood Lipids  Have your blood tested for lipids and cholesterol every 5 years starting at 68 years of age. If you are at high risk for heart disease, you should start having your blood tested when you are 68 years old. You may need to have your cholesterol levels checked more often if: ? Your lipid or cholesterol levels are high. ? You are older than 68 years of age. ? You  are at high risk for heart disease.  What should I know about cancer screening? Many types of cancers can be detected early and may often be prevented. Lung Cancer  You should be screened every year for lung cancer if: ? You are a current smoker who has smoked for at least 30 years. ? You are a former smoker who has quit within the past 15 years.  Talk to your health care provider about your screening options, when you should start screening, and how often you should be screened.  Colorectal Cancer  Routine colorectal cancer screening usually begins at 68 years of age and should be repeated every 5-10 years until you are 68 years old. You may need to be screened more often if early forms of precancerous polyps or small growths are found. Your health care provider may recommend screening at an earlier age if you have risk factors for colon cancer.  Your health care provider may recommend using home test kits to check for hidden blood in the stool.  A small camera at the end of a tube can be used to examine your colon (sigmoidoscopy or colonoscopy). This checks for the earliest forms of colorectal cancer.  Prostate and Testicular Cancer  Depending on your age and overall health, your health care provider may do certain tests to screen for prostate and testicular cancer.  Talk to your health care provider about any symptoms or concerns you have about testicular or prostate cancer.  Skin Cancer  Check your skin   from head to toe regularly.  Tell your health care provider about any new moles or changes in moles, especially if: ? There is a change in a mole's size, shape, or color. ? You have a mole that is larger than a pencil eraser.  Always use sunscreen. Apply sunscreen liberally and repeat throughout the day.  Protect yourself by wearing long sleeves, pants, a wide-brimmed hat, and sunglasses when outside.  What should I know about heart disease, diabetes, and high blood  pressure?  If you are 18-39 years of age, have your blood pressure checked every 3-5 years. If you are 40 years of age or older, have your blood pressure checked every year. You should have your blood pressure measured twice-once when you are at a hospital or clinic, and once when you are not at a hospital or clinic. Record the average of the two measurements. To check your blood pressure when you are not at a hospital or clinic, you can use: ? An automated blood pressure machine at a pharmacy. ? A home blood pressure monitor.  Talk to your health care provider about your target blood pressure.  If you are between 45-79 years old, ask your health care provider if you should take aspirin to prevent heart disease.  Have regular diabetes screenings by checking your fasting blood sugar level. ? If you are at a normal weight and have a low risk for diabetes, have this test once every three years after the age of 45. ? If you are overweight and have a high risk for diabetes, consider being tested at a younger age or more often.  A one-time screening for abdominal aortic aneurysm (AAA) by ultrasound is recommended for men aged 65-75 years who are current or former smokers. What should I know about preventing infection? Hepatitis B If you have a higher risk for hepatitis B, you should be screened for this virus. Talk with your health care provider to find out if you are at risk for hepatitis B infection. Hepatitis C Blood testing is recommended for:  Everyone born from 1945 through 1965.  Anyone with known risk factors for hepatitis C.  Sexually Transmitted Diseases (STDs)  You should be screened each year for STDs including gonorrhea and chlamydia if: ? You are sexually active and are younger than 68 years of age. ? You are older than 68 years of age and your health care provider tells you that you are at risk for this type of infection. ? Your sexual activity has changed since you were last  screened and you are at an increased risk for chlamydia or gonorrhea. Ask your health care provider if you are at risk.  Talk with your health care provider about whether you are at high risk of being infected with HIV. Your health care provider may recommend a prescription medicine to help prevent HIV infection.  What else can I do?  Schedule regular health, dental, and eye exams.  Stay current with your vaccines (immunizations).  Do not use any tobacco products, such as cigarettes, chewing tobacco, and e-cigarettes. If you need help quitting, ask your health care provider.  Limit alcohol intake to no more than 2 drinks per day. One drink equals 12 ounces of beer, 5 ounces of wine, or 1 ounces of hard liquor.  Do not use street drugs.  Do not share needles.  Ask your health care provider for help if you need support or information about quitting drugs.  Tell your health care   provider if you often feel depressed.  Tell your health care provider if you have ever been abused or do not feel safe at home. This information is not intended to replace advice given to you by your health care provider. Make sure you discuss any questions you have with your health care provider. Document Released: 10/31/2007 Document Revised: 01/01/2016 Document Reviewed: 02/05/2015 Elsevier Interactive Patient Education  2018 Elsevier Inc.  
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
## 2017-11-11 LAB — COMPREHENSIVE METABOLIC PANEL
A/G RATIO: 1.3 (ref 1.2–2.2)
ALT: 16 IU/L (ref 0–44)
AST: 19 IU/L (ref 0–40)
Albumin: 4.3 g/dL (ref 3.6–4.8)
Alkaline Phosphatase: 78 IU/L (ref 39–117)
BUN/Creatinine Ratio: 9 — ABNORMAL LOW (ref 10–24)
BUN: 10 mg/dL (ref 8–27)
Bilirubin Total: 0.6 mg/dL (ref 0.0–1.2)
CALCIUM: 9.7 mg/dL (ref 8.6–10.2)
CO2: 24 mmol/L (ref 20–29)
Chloride: 108 mmol/L — ABNORMAL HIGH (ref 96–106)
Creatinine, Ser: 1.1 mg/dL (ref 0.76–1.27)
GFR calc Af Amer: 80 mL/min/{1.73_m2} (ref >59)
GFR, EST NON AFRICAN AMERICAN: 69 mL/min/{1.73_m2} (ref >59)
GLUCOSE: 102 mg/dL — AB (ref 65–99)
Globulin, Total: 3.2 g/dL (ref 1.5–4.5)
POTASSIUM: 4.8 mmol/L (ref 3.5–5.2)
Sodium: 142 mmol/L (ref 134–144)
TOTAL PROTEIN: 7.5 g/dL (ref 6.0–8.5)

## 2017-11-11 LAB — LIPID PANEL
CHOL/HDL RATIO: 2.9 ratio (ref 0.0–5.0)
Cholesterol, Total: 143 mg/dL (ref 100–199)
HDL: 50 mg/dL (ref >39)
LDL Calculated: 83 mg/dL (ref 0–99)
TRIGLYCERIDES: 52 mg/dL (ref 0–149)
VLDL Cholesterol Cal: 10 mg/dL (ref 5–40)

## 2017-11-11 LAB — PSA: Prostate Specific Ag, Serum: 0.6 ng/mL (ref 0.0–4.0)

## 2017-11-12 ENCOUNTER — Encounter: Payer: Self-pay | Admitting: Emergency Medicine

## 2017-11-16 ENCOUNTER — Encounter: Payer: Self-pay | Admitting: Internal Medicine

## 2017-11-16 ENCOUNTER — Encounter: Payer: Self-pay | Admitting: *Deleted

## 2017-11-26 ENCOUNTER — Telehealth: Payer: Self-pay

## 2017-11-26 ENCOUNTER — Encounter: Payer: Self-pay | Admitting: Emergency Medicine
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
## 2017-11-26 NOTE — Telephone Encounter (Signed)
Good advise. Thanks.

## 2017-11-26 NOTE — Telephone Encounter (Signed)
If he's having speech problems he needs to go to ER and be evaluated for possible stroke.

## 2017-12-01 ENCOUNTER — Encounter: Payer: Self-pay | Admitting: Emergency Medicine

## 2017-12-02 NOTE — Telephone Encounter (Signed)
Needs OV.  

## 2017-12-03 DIAGNOSIS — H2513 Age-related nuclear cataract, bilateral: Secondary | ICD-10-CM | POA: Diagnosis not present

## 2017-12-03 DIAGNOSIS — H40013 Open angle with borderline findings, low risk, bilateral: Secondary | ICD-10-CM | POA: Diagnosis not present

## 2017-12-07 ENCOUNTER — Other Ambulatory Visit: Payer: Self-pay

## 2017-12-07 ENCOUNTER — Ambulatory Visit (INDEPENDENT_AMBULATORY_CARE_PROVIDER_SITE_OTHER): Payer: Medicare Other | Admitting: Emergency Medicine

## 2017-12-07 ENCOUNTER — Encounter: Payer: Self-pay | Admitting: Emergency Medicine

## 2017-12-07 VITALS — BP 130/66 | HR 63 | Temp 97.8°F | Resp 16 | Ht 74.5 in | Wt 215.0 lb

## 2017-12-07 DIAGNOSIS — R059 Cough, unspecified: Secondary | ICD-10-CM

## 2017-12-07 DIAGNOSIS — R05 Cough: Secondary | ICD-10-CM

## 2017-12-07 DIAGNOSIS — I10 Essential (primary) hypertension: Secondary | ICD-10-CM

## 2017-12-07 NOTE — Patient Instructions (Addendum)
   IF you received an x-ray today, you will receive an invoice from [facility]. Please contact [facility] at [phone] with questions or concerns regarding your invoice.   IF you received labwork today, you will receive an invoice from [facility]. Please contact [facility] at [phone] with questions or concerns regarding your invoice.   Our billing staff will not be able to assist you with questions regarding bills from these companies.  You will be contacted with the lab results as soon as they are available. The fastest way to get your results is to activate your My Chart account. Instructions are located on the last page of this paperwork. If you have not heard from us regarding the results in 2 weeks, please contact this office.      Health Maintenance, Male A healthy lifestyle and preventive care is important for your health and wellness. Ask your health care provider about what schedule of regular examinations is right for you. What should I know about weight and diet? Eat a Healthy Diet  Eat plenty of vegetables, fruits, whole grains, low-fat dairy products, and lean protein.  Do not eat a lot of foods high in solid fats, added sugars, or salt.  Maintain a Healthy Weight Regular exercise can help you achieve or maintain a healthy weight. You should:  Do at least 150 minutes of exercise each week. The exercise should increase your heart rate and make you sweat (moderate-intensity exercise).  Do strength-training exercises at least twice a week.  Watch Your Levels of Cholesterol and Blood Lipids  Have your blood tested for lipids and cholesterol every 5 years starting at 68 years of age. If you are at high risk for heart disease, you should start having your blood tested when you are 68 years old. You may need to have your cholesterol levels checked more often if: ? Your lipid or cholesterol levels are high. ? You are older than 68 years of age. ? You  are at high risk for heart disease.  What should I know about cancer screening? Many types of cancers can be detected early and may often be prevented. Lung Cancer  You should be screened every year for lung cancer if: ? You are a current smoker who has smoked for at least 30 years. ? You are a former smoker who has quit within the past 15 years.  Talk to your health care provider about your screening options, when you should start screening, and how often you should be screened.  Colorectal Cancer  Routine colorectal cancer screening usually begins at 68 years of age and should be repeated every 5-10 years until you are 68 years old. You may need to be screened more often if early forms of precancerous polyps or small growths are found. Your health care provider may recommend screening at an earlier age if you have risk factors for colon cancer.  Your health care provider may recommend using home test kits to check for hidden blood in the stool.  A small camera at the end of a tube can be used to examine your colon (sigmoidoscopy or colonoscopy). This checks for the earliest forms of colorectal cancer.  Prostate and Testicular Cancer  Depending on your age and overall health, your health care provider may do certain tests to screen for prostate and testicular cancer.  Talk to your health care provider about any symptoms or concerns you have about testicular or prostate cancer.  Skin Cancer  Check your skin   from head to toe regularly.  Tell your health care provider about any new moles or changes in moles, especially if: ? There is a change in a mole's size, shape, or color. ? You have a mole that is larger than a pencil eraser.  Always use sunscreen. Apply sunscreen liberally and repeat throughout the day.  Protect yourself by wearing long sleeves, pants, a wide-brimmed hat, and sunglasses when outside.  What should I know about heart disease, diabetes, and high blood  pressure?  If you are 18-39 years of age, have your blood pressure checked every 3-5 years. If you are 40 years of age or older, have your blood pressure checked every year. You should have your blood pressure measured twice-once when you are at a hospital or clinic, and once when you are not at a hospital or clinic. Record the average of the two measurements. To check your blood pressure when you are not at a hospital or clinic, you can use: ? An automated blood pressure machine at a pharmacy. ? A home blood pressure monitor.  Talk to your health care provider about your target blood pressure.  If you are between 45-79 years old, ask your health care provider if you should take aspirin to prevent heart disease.  Have regular diabetes screenings by checking your fasting blood sugar level. ? If you are at a normal weight and have a low risk for diabetes, have this test once every three years after the age of 45. ? If you are overweight and have a high risk for diabetes, consider being tested at a younger age or more often.  A one-time screening for abdominal aortic aneurysm (AAA) by ultrasound is recommended for men aged 65-75 years who are current or former smokers. What should I know about preventing infection? Hepatitis B If you have a higher risk for hepatitis B, you should be screened for this virus. Talk with your health care provider to find out if you are at risk for hepatitis B infection. Hepatitis C Blood testing is recommended for:  Everyone born from 1945 through 1965.  Anyone with known risk factors for hepatitis C.  Sexually Transmitted Diseases (STDs)  You should be screened each year for STDs including gonorrhea and chlamydia if: ? You are sexually active and are younger than 68 years of age. ? You are older than 68 years of age and your health care provider tells you that you are at risk for this type of infection. ? Your sexual activity has changed since you were last  screened and you are at an increased risk for chlamydia or gonorrhea. Ask your health care provider if you are at risk.  Talk with your health care provider about whether you are at high risk of being infected with HIV. Your health care provider may recommend a prescription medicine to help prevent HIV infection.  What else can I do?  Schedule regular health, dental, and eye exams.  Stay current with your vaccines (immunizations).  Do not use any tobacco products, such as cigarettes, chewing tobacco, and e-cigarettes. If you need help quitting, ask your health care provider.  Limit alcohol intake to no more than 2 drinks per day. One drink equals 12 ounces of beer, 5 ounces of wine, or 1 ounces of hard liquor.  Do not use street drugs.  Do not share needles.  Ask your health care provider for help if you need support or information about quitting drugs.  Tell your health care   provider if you often feel depressed.  Tell your health care provider if you have ever been abused or do not feel safe at home. This information is not intended to replace advice given to you by your health care provider. Make sure you discuss any questions you have with your health care provider. Document Released: 10/31/2007 Document Revised: 01/01/2016 Document Reviewed: 02/05/2015 Elsevier Interactive Patient Education  2018 Elsevier Inc.  
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
## 2017-12-08 ENCOUNTER — Encounter: Payer: Self-pay | Admitting: Emergency Medicine

## 2017-12-28 ENCOUNTER — Ambulatory Visit: Payer: Medicare Other | Admitting: Emergency Medicine

## 2018-01-04 ENCOUNTER — Encounter: Payer: Self-pay | Admitting: Emergency Medicine

## 2018-01-05 ENCOUNTER — Other Ambulatory Visit: Payer: Self-pay | Admitting: Emergency Medicine

## 2018-01-05 DIAGNOSIS — R21 Rash and other nonspecific skin eruption: Secondary | ICD-10-CM

## 2018-01-07 ENCOUNTER — Ambulatory Visit (INDEPENDENT_AMBULATORY_CARE_PROVIDER_SITE_OTHER): Payer: Medicare Other | Admitting: Internal Medicine

## 2018-01-07 ENCOUNTER — Encounter: Payer: Self-pay | Admitting: Internal Medicine

## 2018-01-07 VITALS — BP 126/64 | HR 54 | Ht 74.5 in | Wt 222.0 lb

## 2018-01-07 DIAGNOSIS — I10 Essential (primary) hypertension: Secondary | ICD-10-CM

## 2018-01-07 DIAGNOSIS — I2510 Atherosclerotic heart disease of native coronary artery without angina pectoris: Secondary | ICD-10-CM

## 2018-01-07 DIAGNOSIS — E785 Hyperlipidemia, unspecified: Secondary | ICD-10-CM | POA: Diagnosis not present

## 2018-01-07 MED ORDER — ATORVASTATIN CALCIUM 40 MG PO TABS: 40.0000 mg | ORAL_TABLET | Freq: Every day | ORAL | 3 refills | Status: DC

## 2018-01-07 NOTE — Patient Instructions (Addendum)
Your physician has recommended you make the following change in your medication:  -- increase atorvastatin to 40mg  daily  - you can take 2 of your 20mg  tablets daily until you run out  Your physician recommends that you return for lab work in 3 months (fasting) to check cholesterol   Your physician wants you to follow-up in: ONE YEAR with Dr. Debara Pickett. You will receive a reminder letter in the mail two months in advance. If you don't receive a letter, please call our office to schedule the follow-up appointment.
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
## 2018-01-10 DIAGNOSIS — R531 Weakness: Secondary | ICD-10-CM | POA: Diagnosis not present

## 2018-01-10 DIAGNOSIS — J208 Acute bronchitis due to other specified organisms: Secondary | ICD-10-CM | POA: Diagnosis not present

## 2018-01-10 DIAGNOSIS — J9811 Atelectasis: Secondary | ICD-10-CM | POA: Diagnosis not present

## 2018-01-11 ENCOUNTER — Encounter (HOSPITAL_COMMUNITY): Payer: Self-pay | Admitting: *Deleted

## 2018-01-11 ENCOUNTER — Other Ambulatory Visit: Payer: Self-pay

## 2018-01-11 ENCOUNTER — Observation Stay (HOSPITAL_COMMUNITY)
Admission: EM | Admit: 2018-01-11 | Discharge: 2018-01-12 | Disposition: A | Discharge status: Home / Self Care | Payer: Medicare Other | Attending: Internal Medicine | Admitting: Internal Medicine

## 2018-01-11 ENCOUNTER — Emergency Department (HOSPITAL_COMMUNITY): Payer: Medicare Other

## 2018-01-11 ENCOUNTER — Ambulatory Visit: Payer: Medicare Other | Admitting: Emergency Medicine

## 2018-01-11 DIAGNOSIS — K219 Gastro-esophageal reflux disease without esophagitis: Secondary | ICD-10-CM | POA: Diagnosis not present

## 2018-01-11 DIAGNOSIS — I2510 Atherosclerotic heart disease of native coronary artery without angina pectoris: Secondary | ICD-10-CM | POA: Insufficient documentation

## 2018-01-11 DIAGNOSIS — F419 Anxiety disorder, unspecified: Secondary | ICD-10-CM | POA: Insufficient documentation

## 2018-01-11 DIAGNOSIS — I252 Old myocardial infarction: Secondary | ICD-10-CM | POA: Diagnosis not present

## 2018-01-11 DIAGNOSIS — R17 Unspecified jaundice: Secondary | ICD-10-CM | POA: Diagnosis present

## 2018-01-11 DIAGNOSIS — Z955 Presence of coronary angioplasty implant and graft: Secondary | ICD-10-CM | POA: Diagnosis not present

## 2018-01-11 DIAGNOSIS — Z7982 Long term (current) use of aspirin: Secondary | ICD-10-CM | POA: Diagnosis not present

## 2018-01-11 DIAGNOSIS — J181 Lobar pneumonia, unspecified organism: Secondary | ICD-10-CM | POA: Diagnosis not present

## 2018-01-11 DIAGNOSIS — I10 Essential (primary) hypertension: Secondary | ICD-10-CM | POA: Diagnosis present

## 2018-01-11 DIAGNOSIS — Z87891 Personal history of nicotine dependence: Secondary | ICD-10-CM | POA: Insufficient documentation

## 2018-01-11 DIAGNOSIS — Z880 Allergy status to penicillin: Secondary | ICD-10-CM | POA: Diagnosis not present

## 2018-01-11 DIAGNOSIS — R531 Weakness: Secondary | ICD-10-CM

## 2018-01-11 DIAGNOSIS — N179 Acute kidney failure, unspecified: Secondary | ICD-10-CM | POA: Diagnosis present

## 2018-01-11 DIAGNOSIS — I700 Atherosclerosis of aorta: Secondary | ICD-10-CM | POA: Diagnosis not present

## 2018-01-11 DIAGNOSIS — F209 Schizophrenia, unspecified: Secondary | ICD-10-CM | POA: Diagnosis not present

## 2018-01-11 DIAGNOSIS — Z79899 Other long term (current) drug therapy: Secondary | ICD-10-CM | POA: Insufficient documentation

## 2018-01-11 DIAGNOSIS — D696 Thrombocytopenia, unspecified: Secondary | ICD-10-CM

## 2018-01-11 DIAGNOSIS — E785 Hyperlipidemia, unspecified: Secondary | ICD-10-CM | POA: Diagnosis present

## 2018-01-11 DIAGNOSIS — B1920 Unspecified viral hepatitis C without hepatic coma: Secondary | ICD-10-CM | POA: Diagnosis not present

## 2018-01-11 DIAGNOSIS — J189 Pneumonia, unspecified organism: Secondary | ICD-10-CM | POA: Diagnosis present

## 2018-01-11 DIAGNOSIS — R918 Other nonspecific abnormal finding of lung field: Secondary | ICD-10-CM | POA: Diagnosis not present

## 2018-01-11 LAB — URINALYSIS, ROUTINE W REFLEX MICROSCOPIC
Bacteria, UA: NONE SEEN
Bilirubin Urine: NEGATIVE
Glucose, UA: NEGATIVE mg/dL
Ketones, ur: NEGATIVE mg/dL
Leukocytes, UA: NEGATIVE
NITRITE: NEGATIVE
PH: 6 (ref 5.0–8.0)
Protein, ur: NEGATIVE mg/dL
SPECIFIC GRAVITY, URINE: 1.011 (ref 1.005–1.030)

## 2018-01-11 LAB — CBC
HEMATOCRIT: 41.3 % (ref 39.0–52.0)
Hemoglobin: 13.3 g/dL (ref 13.0–17.0)
MCH: 30.9 pg (ref 26.0–34.0)
MCHC: 32.2 g/dL (ref 30.0–36.0)
MCV: 96 fL (ref 78.0–100.0)
Platelets: 164 10*3/uL (ref 150–400)
RBC: 4.3 MIL/uL (ref 4.22–5.81)
RDW: 12 % (ref 11.5–15.5)
WBC: 20.6 10*3/uL — ABNORMAL HIGH (ref 4.0–10.5)

## 2018-01-11 LAB — HEMOGLOBIN A1C
HEMOGLOBIN A1C: 4.4 % — AB (ref 4.8–5.6)
Mean Plasma Glucose: 79.58 mg/dL

## 2018-01-11 LAB — DIFFERENTIAL
BASOS ABS: 0 10*3/uL (ref 0.0–0.1)
Basophils Relative: 0 %
Eosinophils Absolute: 0 10*3/uL (ref 0.0–0.7)
Eosinophils Relative: 0 %
LYMPHS ABS: 1 10*3/uL (ref 0.7–4.0)
LYMPHS PCT: 5 %
Monocytes Absolute: 1 10*3/uL (ref 0.1–1.0)
Monocytes Relative: 5 %
NEUTROS PCT: 90 %
Neutro Abs: 18.6 10*3/uL — ABNORMAL HIGH (ref 1.7–7.7)

## 2018-01-11 LAB — BASIC METABOLIC PANEL
Anion gap: 13 (ref 5–15)
BUN: 25 mg/dL — AB (ref 8–23)
CO2: 19 mmol/L — ABNORMAL LOW (ref 22–32)
CREATININE: 1.65 mg/dL — AB (ref 0.61–1.24)
Calcium: 9.8 mg/dL (ref 8.9–10.3)
Chloride: 103 mmol/L (ref 98–111)
GFR calc Af Amer: 48 mL/min — ABNORMAL LOW (ref >60)
GFR calc non Af Amer: 41 mL/min — ABNORMAL LOW (ref >60)
GLUCOSE: 131 mg/dL — AB (ref 70–99)
POTASSIUM: 4.2 mmol/L (ref 3.5–5.1)
Sodium: 135 mmol/L (ref 135–145)

## 2018-01-11 LAB — GLUCOSE, CAPILLARY
GLUCOSE-CAPILLARY: 107 mg/dL — AB (ref 70–99)
Glucose-Capillary: 128 mg/dL — ABNORMAL HIGH (ref 70–99)

## 2018-01-11 LAB — HEPATIC FUNCTION PANEL
ALBUMIN: 3.9 g/dL (ref 3.5–5.0)
ALT: 18 U/L (ref 0–44)
AST: 23 U/L (ref 15–41)
Alkaline Phosphatase: 55 U/L (ref 38–126)
Bilirubin, Direct: 0.3 mg/dL — ABNORMAL HIGH (ref 0.0–0.2)
Indirect Bilirubin: 1.5 mg/dL — ABNORMAL HIGH (ref 0.3–0.9)
Total Bilirubin: 1.8 mg/dL — ABNORMAL HIGH (ref 0.3–1.2)
Total Protein: 7.4 g/dL (ref 6.5–8.1)

## 2018-01-11 LAB — TROPONIN I: Troponin I: <0.03 ng/mL (ref <0.03)

## 2018-01-11 LAB — PROCALCITONIN: PROCALCITONIN: 5.13 ng/mL

## 2018-01-11 LAB — CBG MONITORING, ED: Glucose-Capillary: 96 mg/dL (ref 70–99)

## 2018-01-11 LAB — STREP PNEUMONIAE URINARY ANTIGEN: STREP PNEUMO URINARY ANTIGEN: NEGATIVE

## 2018-01-11 MED ORDER — SODIUM CHLORIDE 0.9 % IV SOLN
500.0000 mg | Freq: Once | INTRAVENOUS | Status: AC
  Administered 2018-01-11: 500 mg via INTRAVENOUS
  Filled 2018-01-11: qty 500

## 2018-01-11 MED ORDER — SODIUM CHLORIDE 0.9 % IV SOLN
INTRAVENOUS | Status: DC
  Administered 2018-01-11 – 2018-01-12 (×2): via INTRAVENOUS

## 2018-01-11 MED ORDER — SODIUM CHLORIDE 0.9 % IV SOLN
2.0000 g | INTRAVENOUS | Status: DC
  Administered 2018-01-12: 2 g via INTRAVENOUS
  Filled 2018-01-11: qty 20

## 2018-01-11 MED ORDER — DORZOLAMIDE HCL-TIMOLOL MAL 2-0.5 % OP SOLN
1.0000 [drp] | Freq: Two times a day (BID) | OPHTHALMIC | Status: DC
  Administered 2018-01-11 – 2018-01-12 (×3): 1 [drp] via OPHTHALMIC
  Filled 2018-01-11: qty 10

## 2018-01-11 MED ORDER — SODIUM CHLORIDE 0.9 % IV SOLN
500.0000 mg | INTRAVENOUS | Status: DC
  Filled 2018-01-11: qty 500

## 2018-01-11 MED ORDER — MONTELUKAST SODIUM 10 MG PO TABS
10.0000 mg | ORAL_TABLET | Freq: Every day | ORAL | Status: DC
  Administered 2018-01-11: 10 mg via ORAL
  Filled 2018-01-11: qty 1

## 2018-01-11 MED ORDER — ASPIRIN 81 MG PO CHEW
81.0000 mg | CHEWABLE_TABLET | Freq: Every day | ORAL | Status: DC
  Administered 2018-01-11 – 2018-01-12 (×2): 81 mg via ORAL
  Filled 2018-01-11 (×2): qty 1

## 2018-01-11 MED ORDER — SODIUM CHLORIDE 0.9 % IV SOLN
2.0000 g | Freq: Once | INTRAVENOUS | Status: AC
  Administered 2018-01-11: 2 g via INTRAVENOUS
  Filled 2018-01-11: qty 20

## 2018-01-11 MED ORDER — BUPROPION HCL 75 MG PO TABS
75.0000 mg | ORAL_TABLET | Freq: Every day | ORAL | Status: DC
  Administered 2018-01-11 – 2018-01-12 (×2): 75 mg via ORAL
  Filled 2018-01-11 (×2): qty 1

## 2018-01-11 MED ORDER — AMLODIPINE BESYLATE 5 MG PO TABS
2.5000 mg | ORAL_TABLET | Freq: Every day | ORAL | Status: DC
  Administered 2018-01-11 – 2018-01-12 (×2): 2.5 mg via ORAL
  Filled 2018-01-11 (×2): qty 1

## 2018-01-11 MED ORDER — INSULIN ASPART 100 UNIT/ML ~~LOC~~ SOLN: 0.0000 [IU] | Freq: Three times a day (TID) | SUBCUTANEOUS | Status: DC

## 2018-01-11 MED ORDER — LORATADINE 10 MG PO TABS
10.0000 mg | ORAL_TABLET | Freq: Every day | ORAL | Status: DC
  Administered 2018-01-11 – 2018-01-12 (×2): 10 mg via ORAL
  Filled 2018-01-11 (×2): qty 1

## 2018-01-11 MED ORDER — ENOXAPARIN SODIUM 40 MG/0.4ML ~~LOC~~ SOLN
40.0000 mg | SUBCUTANEOUS | Status: DC
  Administered 2018-01-12: 40 mg via SUBCUTANEOUS
  Filled 2018-01-11 (×2): qty 0.4

## 2018-01-11 MED ORDER — SODIUM CHLORIDE 0.9 % IV BOLUS
1000.0000 mL | Freq: Once | INTRAVENOUS | Status: AC
  Administered 2018-01-11: 1000 mL via INTRAVENOUS

## 2018-01-11 MED ORDER — PANTOPRAZOLE SODIUM 40 MG PO TBEC
40.0000 mg | DELAYED_RELEASE_TABLET | Freq: Every day | ORAL | Status: DC
  Administered 2018-01-11 – 2018-01-12 (×2): 40 mg via ORAL
  Filled 2018-01-11 (×2): qty 1

## 2018-01-11 MED ORDER — ASPIRIN-CALCIUM CARBONATE 81-777 MG PO TABS: 1.0000 | ORAL_TABLET | Freq: Every day | ORAL | Status: DC

## 2018-01-11 MED ORDER — ATORVASTATIN CALCIUM 40 MG PO TABS
40.0000 mg | ORAL_TABLET | Freq: Every day | ORAL | Status: DC
  Administered 2018-01-11 – 2018-01-12 (×2): 40 mg via ORAL
  Filled 2018-01-11: qty 1
  Filled 2018-01-11: qty 2

## 2018-01-11 MED ORDER — CALCIUM CARBONATE ANTACID 500 MG PO CHEW
1.5000 | CHEWABLE_TABLET | Freq: Every day | ORAL | Status: DC
  Administered 2018-01-11 – 2018-01-12 (×2): 300 mg via ORAL
  Filled 2018-01-11 (×2): qty 2
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
## 2018-01-11 NOTE — ED Notes (Signed)
Checked sugar, 96. RN notified

## 2018-01-11 NOTE — ED Provider Notes (Addendum)
9:00 AM Care assumed from Dr. Roxanne Mins.  At time of transfer care, patient is awaiting admission for weakness and fatigue as well as cough after the discovery of acute kidney injury, leukocytosis, and pneumonia.    Patient was given antibiotics for community associated pneumonia and will be admitted for further management.   Clinical Impression: 1. Community acquired pneumonia of right lower lobe of lung ([place])   2. Acute kidney injury (nontraumatic) ([place])   3. Total bilirubin, elevated     Disposition: Admit  This note was prepared with assistance of Dragon voice recognition software. Occasional wrong-word or sound-a-like substitutions may have occurred due to the inherent limitations of voice recognition software.     Tegeler, Gwenyth Allegra, MD 01/11/18 [phone]

## 2018-01-11 NOTE — ED Triage Notes (Signed)
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 Filled, wife states patient is weaker tonight

## 2018-01-11 NOTE — ED Notes (Signed)
Report taken from ongoing RN - care asumed at this time

## 2018-01-11 NOTE — ED Notes (Signed)
Report called to [facility] - ready to accept pt - phlebotomy in drawing BCs - pt to be transported after labs obtained

## 2018-01-12 DIAGNOSIS — I10 Essential (primary) hypertension: Secondary | ICD-10-CM

## 2018-01-12 DIAGNOSIS — J189 Pneumonia, unspecified organism: Secondary | ICD-10-CM

## 2018-01-12 DIAGNOSIS — D696 Thrombocytopenia, unspecified: Secondary | ICD-10-CM

## 2018-01-12 DIAGNOSIS — J181 Lobar pneumonia, unspecified organism: Secondary | ICD-10-CM | POA: Diagnosis not present

## 2018-01-12 DIAGNOSIS — N179 Acute kidney failure, unspecified: Secondary | ICD-10-CM | POA: Diagnosis not present

## 2018-01-12 LAB — CBC WITH DIFFERENTIAL/PLATELET
BASOS PCT: 0 %
Basophils Absolute: 0 10*3/uL (ref 0.0–0.1)
EOS PCT: 0 %
Eosinophils Absolute: 0 10*3/uL (ref 0.0–0.7)
HEMATOCRIT: 35.3 % — AB (ref 39.0–52.0)
Hemoglobin: 11.4 g/dL — ABNORMAL LOW (ref 13.0–17.0)
LYMPHS ABS: 1.3 10*3/uL (ref 0.7–4.0)
Lymphocytes Relative: 9 %
MCH: 30.6 pg (ref 26.0–34.0)
MCHC: 32.3 g/dL (ref 30.0–36.0)
MCV: 94.6 fL (ref 78.0–100.0)
MONO ABS: 0.9 10*3/uL (ref 0.1–1.0)
MONOS PCT: 6 %
NEUTROS ABS: 12.1 10*3/uL — AB (ref 1.7–7.7)
Neutrophils Relative %: 85 %
PLATELETS: 121 10*3/uL — AB (ref 150–400)
RBC: 3.73 MIL/uL — AB (ref 4.22–5.81)
RDW: 12 % (ref 11.5–15.5)
WBC: 14.3 10*3/uL — ABNORMAL HIGH (ref 4.0–10.5)

## 2018-01-12 LAB — BASIC METABOLIC PANEL
Anion gap: 6 (ref 5–15)
BUN: 17 mg/dL (ref 8–23)
CHLORIDE: 114 mmol/L — AB (ref 98–111)
CO2: 19 mmol/L — ABNORMAL LOW (ref 22–32)
CREATININE: 1.19 mg/dL (ref 0.61–1.24)
Calcium: 8.2 mg/dL — ABNORMAL LOW (ref 8.9–10.3)
GFR calc Af Amer: >60 mL/min (ref >60)
GFR calc non Af Amer: >60 mL/min (ref >60)
GLUCOSE: 95 mg/dL (ref 70–99)
POTASSIUM: 4.1 mmol/L (ref 3.5–5.1)
Sodium: 139 mmol/L (ref 135–145)

## 2018-01-12 LAB — GLUCOSE, CAPILLARY
Glucose-Capillary: 85 mg/dL (ref 70–99)
Glucose-Capillary: 144 mg/dL — ABNORMAL HIGH (ref 70–99)

## 2018-01-12 LAB — HIV ANTIBODY (ROUTINE TESTING W REFLEX): HIV Screen 4th Generation wRfx: NONREACTIVE

## 2018-01-12 MED ORDER — DOXYCYCLINE HYCLATE 100 MG PO TABS: 100.0000 mg | ORAL_TABLET | Freq: Two times a day (BID) | ORAL | Status: DC

## 2018-01-12 MED ORDER — AZITHROMYCIN 500 MG PO TABS
500.0000 mg | ORAL_TABLET | Freq: Every day | ORAL | Status: DC
  Administered 2018-01-12: 500 mg via ORAL
  Filled 2018-01-12: qty 1

## 2018-01-12 MED ORDER — DOXYCYCLINE HYCLATE 100 MG PO TABS: 100.0000 mg | ORAL_TABLET | Freq: Two times a day (BID) | ORAL | 0 refills | Status: AC

## 2018-01-12 NOTE — Progress Notes (Signed)
Patient discharged to home, AVS reviewed, IV removed, patient is aware of new meds and follow-up appointments. Left floor via wheelchair with volunteers.

## 2018-01-12 NOTE — Discharge Summary (Addendum)
Physician Discharge Summary  Adam Peters BHA:[id number] DOB: 1950-12-26 DOA: 01/11/2018  PCP: Horald Pollen, MD  Admit date: 01/11/2018 Discharge date: 01/12/2018  Admitted From: [facility]  Disposition:  Recommendations for Outpatient Follow-up:  1. Follow up with PCP this week, patient has already appt to monitor pneumonia till its resolution. 2. Please obtain BMP/CBC as needed by PCP    Home Health No Equipment/Devices: None   Discharge Condition: Stable   CODE STATUS: FULL   Diet recommendation:   Heart Healthy   Brief/Interim Summary:  67 yo male  Presenting on 8/27 with generalized malaise, white sputum production, and mild work of breathingsince Sunday. Prior to this admission he had been seen at [facility]. He had a negative CXR, given azithromycin which he never was able to start as symptoms worsened.    New CXR revealed patchy bilateral R>L suspicious for PNA. Received IVF, Azithromycin and Rocephin  IV and admitted for Observation.      Discharge Diagnoses:  Principal Problem:   CAP (community acquired pneumonia) Active Problems:   Essential hypertension   Hyperlipidemia   Weakness   Acute renal failure (ARF) (HCC)   Thrombocytopenia (HCC)  Pneumonia, likely community acquired. CXR revealed patchy bilateral R>L suspicious for PNA. Received IVF, Azithromycin and Rocephin  IV and admitted for Observation. Overall status improved WBC was 20 , now 14.6 . Blood and Sputum Cultures pending.  Discharge to home in stable condition  Follow with PCP this week, with labs and follow cultures as well as OP Start Doxycycline 100 mg bid oral  Acute Kidney Injury likely due to dehydration and active infection , diuretics were held, given generous  IVF and ACE I and NSAIDS were held, with improvement of Cr from 1.65 to 1.19   Lab Results  Component Value Date   CREATININE 1.19 01/12/2018   CREATININE 1.65 (H) 01/11/2018   CREATININE 1.10 11/10/2017  May  resume his ASA today  Recommended increase oral fluid intake    Hypertension BP 139/72   Pulse 70   Resp 20    Controlled Continue home anti-hypertensive medications with Norvasc  Hyperlipidemia Continue home  LIpitor   Generalized weakness likely secondary to infection . MRI brail  performed by EDP due to generalized weakness negative for acute findings. No unilateral weakness noted  Increase activity slowly with assistance  Thrombocytopenia Likely due to dilution and Infection,no acute  bleeding issues noted. Remote ho Hep C .PLt 121, was 146 .  Hold ASA if PLt count is less than 70 k as outpatient    Monitor counts closely as OP      Discharge Instructions  Discharge Instructions    Call MD for:  difficulty breathing, headache or visual disturbances   Complete by:  As directed    Call MD for:  extreme fatigue   Complete by:  As directed    Call MD for:  hives   Complete by:  As directed    Call MD for:  persistant dizziness or light-headedness   Complete by:  As directed    Call MD for:  persistant nausea and vomiting   Complete by:  As directed    Call MD for:  redness, tenderness, or signs of infection (pain, swelling, redness, odor or green/yellow discharge around incision site)   Complete by:  As directed    Call MD for:  severe uncontrolled pain   Complete by:  As directed    Call MD for:  temperature >100.4   Complete by:  As directed    Diet - low sodium heart healthy   Complete by:  As directed    Discharge instructions   Complete by:  As directed    Finish you course of antibiotics, follow up with your PCP this week.   Increase activity slowly   Complete by:  As directed    No wound care   Complete by:  As directed      Allergies as of 01/12/2018      Reactions   Amoxicillin Diarrhea   GI upset.      Medication List    TAKE these medications   alprostadil 20 MCG injection Commonly known as:  EDEX 20 mcg by Intracavitary route as needed for  erectile dysfunction. use no more than 3 times per week   amLODipine 2.5 MG tablet Commonly known as:  NORVASC TAKE 1 TABLET DAILY   Aspirin-Calcium Carbonate 81-777 MG Tabs Take 1 tablet by mouth daily.   atorvastatin 40 MG tablet Commonly known as:  LIPITOR Take 1 tablet (40 mg total) by mouth daily.   buPROPion 75 MG tablet Commonly known as:  WELLBUTRIN Take 75 mg by mouth daily.   calcipotriene 0.005 % cream Commonly known as:  DOVONOX Apply topically 2 (two) times daily.   cetirizine 10 MG tablet Commonly known as:  ZYRTEC Take 10 mg by mouth daily.   dorzolamide-timolol 22.3-6.8 MG/ML ophthalmic solution Commonly known as:  COSOPT Place 1 drop into both eyes 2 (two) times daily.   doxycycline 100 MG tablet Commonly known as:  VIBRA-TABS Take 1 tablet (100 mg total) by mouth every 12 (twelve) hours for 4 days.   montelukast 10 MG tablet Commonly known as:  SINGULAIR Take 10 mg by mouth at bedtime.   nitroGLYCERIN 0.4 MG SL tablet Commonly known as:  NITROSTAT DISSOLVE 1 TABLET UNDER THE TONGUE EVERY 5 MINUTES AS NEEDED FOR CHEST PAIN What changed:  See the new instructions.   omeprazole 20 MG capsule Commonly known as:  PRILOSEC TAKE 2 CAPSULES TWICE A DAY BEFORE MEALS What changed:  See the new instructions.      Follow-up Information    Horald Pollen, MD Follow up.   Specialty:  Internal Medicine Why:  Patient has appt scheduled already  Contact information: [place] [address] [phone]          Allergies  Allergen Reactions  . Amoxicillin Diarrhea    GI upset.    Consultations:   Physician/Group: None     Procedures/Studies: Dg Chest 2 View  Result Date: 01/11/2018 CLINICAL DATA:  67 y/o  M; weakness and chest discomfort. EXAM: CHEST - 2 VIEW COMPARISON:  09/24/2017 chest radiograph FINDINGS: Stable normal cardiac silhouette given projection and technique. Aortic atherosclerosis with calcification.  Ill-defined patchy opacities at the right greater than left lung bases. No pleural effusion or pneumothorax. No acute osseous abnormality is evident. IMPRESSION: Patchy right greater than left basilar opacities, suspected pneumonia. Electronically Signed   By: Kristine Garbe M.D.   On: 01/11/2018 06:30   [name] Contrast  Result Date: 01/11/2018 CLINICAL DATA:  Stroke suspected.  Generalized weakness. EXAM: MRI HEAD WITHOUT CONTRAST TECHNIQUE: Multiplanar, multiecho pulse sequences of the brain and surrounding structures were obtained without intravenous contrast. COMPARISON:  None. FINDINGS: Brain: No acute infarction, hemorrhage, hydrocephalus, extra-axial collection or mass lesion. Mild chronic small vessel ischemia in the cerebral white matter. 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 Vascular: Major flow voids are preserved Skull and upper cervical spine: No evidence of marrow lesion Sinuses/Orbits: Negative Other: Incidental left parietal scalp scarring. IMPRESSION: 1. No acute finding. 2. Overall mild chronic small vessel ischemia. Electronically Signed   By: Monte Fantasia M.D.   On: 01/11/2018 08:34      Subjective: Overall status improved. Reports feeling better.   Discharge Exam: Vitals:   01/12/18 0345 01/12/18 0803  BP: 131/71 139/72  Pulse: 72 70  Resp: 18 20  Temp: 99 F (37.2 C) 98 F (36.7 C)  SpO2: 98% 99%   Vitals:   01/11/18 2033 01/11/18 2315 01/12/18 0345 01/12/18 0803  BP: 123/64  131/71 139/72  Pulse: 79  72 70  Resp: 18  18 20   Temp: 98.9 F (37.2 C)  99 F (37.2 C) 98 F (36.7 C)  TempSrc: Oral  Oral Oral  SpO2: 98%  98% 99%  Weight:  100 kg    Height:  6\' 3"  (1.905 m)      General: Pt is alert, awake, not in acute distress Cardiovascular: RRR, S1/S2 +, no rubs, no gallops Respiratory: Essentlially CTA bilaterally, no wheezing, no rhonchi Abdominal: Soft, NT, ND, bowel sounds + Extremities: no edema, no cyanosis    The results  of significant diagnostics from this hospitalization (including imaging, microbiology, ancillary and laboratory) are listed below for reference.     Microbiology: Recent Results (from the past 240 hour(s))  Culture, blood (routine x 2) Call MD if unable to obtain prior to antibiotics being given     Status: None (Preliminary result)   Collection Time: 01/11/18 12:55 PM  Result Value Ref Range Status   Specimen Description BLOOD BLOOD LEFT WRIST  Final   Special Requests   Final    BOTTLES DRAWN AEROBIC AND ANAEROBIC Blood Culture results may not be optimal due to an inadequate volume of blood received in culture bottles   Culture   Final    NO GROWTH < 24 HOURS Performed at [facility], [place] [address], [place], [place] [address]    Report Status PENDING  Incomplete  Culture, blood (routine x 2) Call MD if unable to obtain prior to antibiotics being given     Status: None (Preliminary result)   Collection Time: 01/11/18  1:05 PM  Result Value Ref Range Status   Specimen Description BLOOD BLOOD RIGHT WRIST  Final   Special Requests   Final    BOTTLES DRAWN AEROBIC ONLY Blood Culture results may not be optimal due to an inadequate volume of blood received in culture bottles   Culture   Final    NO GROWTH < 24 HOURS Performed at [facility], [place] [address], [place], [place] [address]    Report Status PENDING  Incomplete     Labs: BNP (last 3 results) No results for input(s): BNP in the last 8760 hours. Basic Metabolic Panel: Recent Labs  Lab 01/11/18 0429 01/12/18 0335  NA 135 139  K 4.2 4.1  CL 103 114*  CO2 19* 19*  GLUCOSE 131* 95  BUN 25* 17  CREATININE 1.65* 1.19  CALCIUM 9.8 8.2*   Liver Function Tests: Recent Labs  Lab 01/11/18 0429  AST 23  ALT 18  ALKPHOS 55  BILITOT 1.8*  PROT 7.4  ALBUMIN 3.9   No results for input(s): LIPASE, AMYLASE in the last 168 hours. No results for input(s): AMMONIA in the last 168 hours. CBC: Recent Labs  Lab  01/11/18 0429 01/12/18 0335  WBC 20.6* 14.3*  NEUTROABS 18.6* 12.1*  HGB 13.3 11.4*  HCT 41.3 35.3*  MCV 96.0 94.6  PLT 164 121*   Cardiac Enzymes: Recent Labs  Lab 01/11/18 0429  TROPONINI <0.03   BNP: Invalid input(s): POCBNP CBG: Recent Labs  Lab 01/11/18 1248 01/11/18 1738 01/11/18 2219 01/12/18 0804 01/12/18 1130  GLUCAP 96 128* 107* 85 144*   D-Dimer No results for input(s): DDIMER in the last 72 hours. Hgb A1c Recent Labs    01/11/18 1344  HGBA1C 4.4*   Lipid Profile No results for input(s): CHOL, HDL, LDLCALC, TRIG, CHOLHDL, LDLDIRECT in the last 72 hours. Thyroid function studies No results for input(s): TSH, T4TOTAL, T3FREE, THYROIDAB in the last 72 hours.  Invalid input(s): FREET3 Anemia work up No results for input(s): VITAMINB12, FOLATE, FERRITIN, TIBC, IRON, RETICCTPCT in the last 72 hours. Urinalysis    Component Value Date/Time   COLORURINE YELLOW 01/11/2018 0429   APPEARANCEUR CLEAR 01/11/2018 0429   LABSPEC 1.011 01/11/2018 0429   PHURINE 6.0 01/11/2018 0429   GLUCOSEU NEGATIVE 01/11/2018 0429   HGBUR MODERATE (A) 01/11/2018 0429   BILIRUBINUR NEGATIVE 01/11/2018 0429   BILIRUBINUR small 07/08/2012 1705   KETONESUR NEGATIVE 01/11/2018 0429   PROTEINUR NEGATIVE 01/11/2018 0429   UROBILINOGEN 1.0 07/08/2012 1705   NITRITE NEGATIVE 01/11/2018 0429   LEUKOCYTESUR NEGATIVE 01/11/2018 0429   Sepsis Labs Invalid input(s): PROCALCITONIN,  WBC,  LACTICIDVEN Microbiology Recent Results (from the past 240 hour(s))  Culture, blood (routine x 2) Call MD if unable to obtain prior to antibiotics being given     Status: None (Preliminary result)   Collection Time: 01/11/18 12:55 PM  Result Value Ref Range Status   Specimen Description BLOOD BLOOD LEFT WRIST  Final   Special Requests   Final    BOTTLES DRAWN AEROBIC AND ANAEROBIC Blood Culture results may not be optimal due to an inadequate volume of blood received in culture bottles   Culture    Final    NO GROWTH < 24 HOURS Performed at [facility], [place] [address], [place], [place] [address]    Report Status PENDING  Incomplete  Culture, blood (routine x 2) Call MD if unable to obtain prior to antibiotics being given     Status: None (Preliminary result)   Collection Time: 01/11/18  1:05 PM  Result Value Ref Range Status   Specimen Description BLOOD BLOOD RIGHT WRIST  Final   Special Requests   Final    BOTTLES DRAWN AEROBIC ONLY Blood Culture results may not be optimal due to an inadequate volume of blood received in culture bottles   Culture   Final    NO GROWTH < 24 HOURS Performed at [facility], [place] [address] [place], [place] [address]    Report Status PENDING  Incomplete     Time coordinating discharge: Over 30 minutes  SIGNED:   Sharene Butters, MD  [facility] 01/12/2018, 12:10 PM   If 7PM-7AM, please contact night-coverage [email or web address] Password [email or web address]

## 2018-01-12 NOTE — Care Management Obs Status (Signed)
[facility] NOTIFICATION   Patient Details  Name: Adam Peters MRN: [id number] Date of Birth: [birth year]   Medicare Observation Status Notification Given:  Yes    Bethena Roys, RN 01/12/2018, 12:36 PM

## 2018-01-14 ENCOUNTER — Ambulatory Visit (INDEPENDENT_AMBULATORY_CARE_PROVIDER_SITE_OTHER): Payer: Medicare Other | Admitting: Emergency Medicine

## 2018-01-14 VITALS — BP 138/77 | HR 81 | Temp 98.0°F | Resp 16 | Ht 75.0 in | Wt 216.0 lb

## 2018-01-14 DIAGNOSIS — R05 Cough: Secondary | ICD-10-CM

## 2018-01-14 DIAGNOSIS — F4320 Adjustment disorder, unspecified: Secondary | ICD-10-CM | POA: Diagnosis not present

## 2018-01-14 DIAGNOSIS — N179 Acute kidney failure, unspecified: Secondary | ICD-10-CM

## 2018-01-14 DIAGNOSIS — I10 Essential (primary) hypertension: Secondary | ICD-10-CM | POA: Diagnosis not present

## 2018-01-14 DIAGNOSIS — R059 Cough, unspecified: Secondary | ICD-10-CM

## 2018-01-14 DIAGNOSIS — I2510 Atherosclerotic heart disease of native coronary artery without angina pectoris: Secondary | ICD-10-CM

## 2018-01-14 DIAGNOSIS — J189 Pneumonia, unspecified organism: Secondary | ICD-10-CM | POA: Diagnosis not present

## 2018-01-14 NOTE — Patient Instructions (Addendum)
Continue present medications. Community-Acquired Pneumonia, Adult Pneumonia is an infection of the lungs. One type of pneumonia can happen while a person is in a hospital. A different type can happen when a person is not in a hospital (community-acquired pneumonia). It is easy for this kind to spread from person to person. It can spread to you if you breathe near an infected person who coughs or sneezes. Some symptoms include:  A dry cough.  A wet (productive) cough.  Fever.  Sweating.  Chest pain.  Follow these instructions at home:  Take over-the-counter and prescription medicines only as told by your doctor. ? Only take cough medicine if you are losing sleep. ? If you were prescribed an antibiotic medicine, take it as told by your doctor. Do not stop taking the antibiotic even if you start to feel better.  Sleep with your head and neck raised (elevated). You can do this by putting a few pillows under your head, or you can sleep in a recliner.  Do not use tobacco products. These include cigarettes, chewing tobacco, and e-cigarettes. If you need help quitting, ask your doctor.  Drink enough water to keep your pee (urine) clear or pale yellow. A shot (vaccine) can help prevent pneumonia. Shots are often suggested for:  People older than 68 years of age.  People older than 68 years of age: ? Who are having cancer treatment. ? Who have long-term (chronic) lung disease. ? Who have problems with their body's defense system (immune system).  You may also prevent pneumonia if you take these actions:  Get the flu (influenza) shot every year.  Go to the dentist as often as told.  Wash your hands often. If soap and water are not available, use hand sanitizer.  Contact a doctor if:  You have a fever.  You lose sleep because your cough medicine does not help. Get help right away if:  You are short of breath and it gets worse.  You have more chest pain.  Your sickness gets  worse. This is very serious if: ? You are an older adult. ? Your body's defense system is weak.  You cough up blood. This information is not intended to replace advice given to you by your health care provider. Make sure you discuss any questions you have with your health care provider. Document Released: 10/21/2007 Document Revised: 10/10/2015 Document Reviewed: 08/29/2014 Elsevier Interactive Patient Education  [employer].    If you have lab work done today you will be contacted with your lab results within the next 2 weeks.  If you have not heard from [place] then please contact us. The fastest way to get your results is to register for My Chart.   IF you received an x-ray today, you will receive an invoice from [facility]. Please contact [facility] at [phone] with questions or concerns regarding your invoice.   IF you received labwork today, you will receive an invoice from [facility]. Please contact [facility] at [phone] with questions or concerns regarding your invoice.   Our billing staff will not be able to assist you with questions regarding bills from these companies.  You will be contacted with the lab results as soon as they are available. The fastest way to get your results is to activate your My Chart account. Instructions are located on the last page of this paperwork. If you have not heard from [place] regarding the results in 2 weeks, please contact this office.
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
## 2018-01-15 LAB — CBC WITH DIFFERENTIAL/PLATELET
BASOS: 0 %
Basophils Absolute: 0 10*3/uL (ref 0.0–0.2)
EOS (ABSOLUTE): 0.1 10*3/uL (ref 0.0–0.4)
EOS: 2 %
Hematocrit: 39.6 % (ref 37.5–51.0)
Hemoglobin: 12.6 g/dL — ABNORMAL LOW (ref 13.0–17.7)
IMMATURE GRANULOCYTES: 1 %
Immature Grans (Abs): 0.1 10*3/uL (ref 0.0–0.1)
LYMPHS ABS: 1.1 10*3/uL (ref 0.7–3.1)
LYMPHS: 19 %
MCH: 29.9 pg (ref 26.6–33.0)
MCHC: 31.8 g/dL (ref 31.5–35.7)
MCV: 94 fL (ref 79–97)
MONOS ABS: 0.5 10*3/uL (ref 0.1–0.9)
Monocytes: 8 %
Neutrophils Absolute: 4 10*3/uL (ref 1.4–7.0)
Neutrophils: 70 %
PLATELETS: 205 10*3/uL (ref 150–450)
RBC: 4.21 x10E6/uL (ref 4.14–5.80)
RDW: 12.8 % (ref 12.3–15.4)
WBC: 5.7 10*3/uL (ref 3.4–10.8)

## 2018-01-15 LAB — BASIC METABOLIC PANEL
BUN/Creatinine Ratio: 12 (ref 10–24)
BUN: 12 mg/dL (ref 8–27)
CALCIUM: 9.7 mg/dL (ref 8.6–10.2)
CHLORIDE: 106 mmol/L (ref 96–106)
CO2: 20 mmol/L (ref 20–29)
Creatinine, Ser: 0.97 mg/dL (ref 0.76–1.27)
GFR calc non Af Amer: 80 mL/min/{1.73_m2} (ref >59)
GFR, EST AFRICAN AMERICAN: 93 mL/min/{1.73_m2} (ref >59)
GLUCOSE: 89 mg/dL (ref 65–99)
Potassium: 4.1 mmol/L (ref 3.5–5.2)
Sodium: 142 mmol/L (ref 134–144)

## 2018-01-16 LAB — CULTURE, BLOOD (ROUTINE X 2)
CULTURE: NO GROWTH
Culture: NO GROWTH

## 2018-01-19 ENCOUNTER — Encounter: Payer: Self-pay | Admitting: *Deleted

## 2018-01-25 ENCOUNTER — Other Ambulatory Visit: Payer: Self-pay | Admitting: Internal Medicine

## 2018-01-31 ENCOUNTER — Encounter: Payer: Self-pay | Admitting: Emergency Medicine

## 2018-02-07 DIAGNOSIS — F4320 Adjustment disorder, unspecified: Secondary | ICD-10-CM | POA: Diagnosis not present

## 2018-02-22 ENCOUNTER — Encounter: Payer: Self-pay | Admitting: Emergency Medicine

## 2018-02-24 ENCOUNTER — Ambulatory Visit (INDEPENDENT_AMBULATORY_CARE_PROVIDER_SITE_OTHER): Payer: Medicare Other

## 2018-02-24 DIAGNOSIS — Z23 Encounter for immunization: Secondary | ICD-10-CM | POA: Diagnosis not present

## 2018-03-07 DIAGNOSIS — F209 Schizophrenia, unspecified: Secondary | ICD-10-CM | POA: Diagnosis not present

## 2018-03-31 ENCOUNTER — Other Ambulatory Visit: Payer: Self-pay | Admitting: Internal Medicine

## 2018-04-07 DIAGNOSIS — F209 Schizophrenia, unspecified: Secondary | ICD-10-CM | POA: Diagnosis not present

## 2018-04-07 DIAGNOSIS — E785 Hyperlipidemia, unspecified: Secondary | ICD-10-CM | POA: Diagnosis not present

## 2018-04-07 LAB — LIPID PANEL
CHOLESTEROL TOTAL: 136 mg/dL (ref 100–199)
Chol/HDL Ratio: 2.9 ratio (ref 0.0–5.0)
HDL: 47 mg/dL (ref >39)
LDL Calculated: 77 mg/dL (ref 0–99)
Triglycerides: 62 mg/dL (ref 0–149)
VLDL CHOLESTEROL CAL: 12 mg/dL (ref 5–40)

## 2018-04-28 DIAGNOSIS — F209 Schizophrenia, unspecified: Secondary | ICD-10-CM | POA: Diagnosis not present

## 2018-05-05 DIAGNOSIS — F4320 Adjustment disorder, unspecified: Secondary | ICD-10-CM | POA: Diagnosis not present

## 2018-06-02 DIAGNOSIS — F209 Schizophrenia, unspecified: Secondary | ICD-10-CM | POA: Diagnosis not present

## 2018-06-07 ENCOUNTER — Other Ambulatory Visit: Payer: Self-pay

## 2018-06-07 ENCOUNTER — Ambulatory Visit (INDEPENDENT_AMBULATORY_CARE_PROVIDER_SITE_OTHER): Payer: Medicare Other | Admitting: Emergency Medicine

## 2018-06-07 ENCOUNTER — Encounter: Payer: Self-pay | Admitting: Emergency Medicine

## 2018-06-07 VITALS — BP 116/72 | HR 69 | Temp 98.0°F | Resp 16 | Ht 75.0 in | Wt 214.0 lb

## 2018-06-07 DIAGNOSIS — I10 Essential (primary) hypertension: Secondary | ICD-10-CM | POA: Diagnosis not present

## 2018-06-07 DIAGNOSIS — Z23 Encounter for immunization: Secondary | ICD-10-CM

## 2018-06-07 DIAGNOSIS — Z8679 Personal history of other diseases of the circulatory system: Secondary | ICD-10-CM | POA: Diagnosis not present

## 2018-06-07 NOTE — Patient Instructions (Addendum)
   If you have lab work done today you will be contacted with your lab results within the next 2 weeks.  If you have not heard from us then please contact us. The fastest way to get your results is to register for My Chart.   IF you received an x-ray today, you will receive an invoice from [facility]. Please contact  Radiology at [phone] with questions or concerns regarding your invoice.   IF you received labwork today, you will receive an invoice from [facility]. Please contact [facility] at [phone] with questions or concerns regarding your invoice.   Our billing staff will not be able to assist you with questions regarding bills from these companies.  You will be contacted with the lab results as soon as they are available. The fastest way to get your results is to activate your My Chart account. Instructions are located on the last page of this paperwork. If you have not heard from us regarding the results in 2 weeks, please contact this office.       Hypertension Hypertension, commonly called high blood pressure, is when the force of blood pumping through the arteries is too strong. The arteries are the blood vessels that carry blood from the heart throughout the body. Hypertension forces the heart to work harder to pump blood and may cause arteries to become narrow or stiff. Having untreated or uncontrolled hypertension can cause heart attacks, strokes, kidney disease, and other problems. A blood pressure reading consists of a higher number over a lower number. Ideally, your blood pressure should be below 120/80. The first ("top") number is called the systolic pressure. It is a measure of the pressure in your arteries as your heart beats. The second ("bottom") number is called the diastolic pressure. It is a measure of the pressure in your arteries as the heart relaxes. What are the causes? The cause of this condition is not known. What increases the  risk? Some risk factors for high blood pressure are under your control. Others are not. Factors you can change  Smoking.  Having type 2 diabetes mellitus, high cholesterol, or both.  Not getting enough exercise or physical activity.  Being overweight.  Having too much fat, sugar, calories, or salt (sodium) in your diet.  Drinking too much alcohol. Factors that are difficult or impossible to change  Having chronic kidney disease.  Having a family history of high blood pressure.  Age. Risk increases with age.  Race. You may be at higher risk if you are African-American.  Gender. Men are at higher risk than women before age 45. After age 65, women are at higher risk than men.  Having obstructive sleep apnea.  Stress. What are the signs or symptoms? Extremely high blood pressure (hypertensive crisis) may cause:  Headache.  Anxiety.  Shortness of breath.  Nosebleed.  Nausea and vomiting.  Severe chest pain.  Jerky movements you cannot control (seizures). How is this diagnosed? This condition is diagnosed by measuring your blood pressure while you are seated, with your arm resting on a surface. The cuff of the blood pressure monitor will be placed directly against the skin of your upper arm at the level of your heart. It should be measured at least twice using the same arm. Certain conditions can cause a difference in blood pressure between your right and left arms. Certain factors can cause blood pressure readings to be lower or higher than normal (elevated) for a short period of time:    When your blood pressure is higher when you are in a health care provider's office than when you are at home, this is called white coat hypertension. Most people with this condition do not need medicines.  When your blood pressure is higher at home than when you are in a health care provider's office, this is called masked hypertension. Most people with this condition may need medicines  to control blood pressure. If you have a high blood pressure reading during one visit or you have normal blood pressure with other risk factors:  You may be asked to return on a different day to have your blood pressure checked again.  You may be asked to monitor your blood pressure at home for 1 week or longer. If you are diagnosed with hypertension, you may have other blood or imaging tests to help your health care provider understand your overall risk for other conditions. How is this treated? This condition is treated by making healthy lifestyle changes, such as eating healthy foods, exercising more, and reducing your alcohol intake. Your health care provider may prescribe medicine if lifestyle changes are not enough to get your blood pressure under control, and if:  Your systolic blood pressure is above 130.  Your diastolic blood pressure is above 80. Your personal target blood pressure may vary depending on your medical conditions, your age, and other factors. Follow these instructions at home: Eating and drinking   Eat a diet that is high in fiber and potassium, and low in sodium, added sugar, and fat. An example eating plan is called the DASH (Dietary Approaches to Stop Hypertension) diet. To eat this way: ? Eat plenty of fresh fruits and vegetables. Try to fill half of your plate at each meal with fruits and vegetables. ? Eat whole grains, such as whole wheat pasta, brown rice, or whole grain bread. Fill about one quarter of your plate with whole grains. ? Eat or drink low-fat dairy products, such as skim milk or low-fat yogurt. ? Avoid fatty cuts of meat, processed or cured meats, and poultry with skin. Fill about one quarter of your plate with lean proteins, such as fish, chicken without skin, beans, eggs, and tofu. ? Avoid premade and processed foods. These tend to be higher in sodium, added sugar, and fat.  Reduce your daily sodium intake. Most people with hypertension should  eat less than 1,500 mg of sodium a day.  Limit alcohol intake to no more than 1 drink a day for nonpregnant women and 2 drinks a day for men. One drink equals 12 oz of beer, 5 oz of wine, or 1 oz of hard liquor. Lifestyle   Work with your health care provider to maintain a healthy body weight or to lose weight. Ask what an ideal weight is for you.  Get at least 30 minutes of exercise that causes your heart to beat faster (aerobic exercise) most days of the week. Activities may include walking, swimming, or biking.  Include exercise to strengthen your muscles (resistance exercise), such as pilates or lifting weights, as part of your weekly exercise routine. Try to do these types of exercises for 30 minutes at least 3 days a week.  Do not use any products that contain nicotine or tobacco, such as cigarettes and e-cigarettes. If you need help quitting, ask your health care provider.  Monitor your blood pressure at home as told by your health care provider.  Keep all follow-up visits as told by your health care provider.   This is important. Medicines  Take over-the-counter and prescription medicines only as told by your health care provider. Follow directions carefully. Blood pressure medicines must be taken as prescribed.  Do not skip doses of blood pressure medicine. Doing this puts you at risk for problems and can make the medicine less effective.  Ask your health care provider about side effects or reactions to medicines that you should watch for. Contact a health care provider if:  You think you are having a reaction to a medicine you are taking.  You have headaches that keep coming back (recurring).  You feel dizzy.  You have swelling in your ankles.  You have trouble with your vision. Get help right away if:  You develop a severe headache or confusion.  You have unusual weakness or numbness.  You feel faint.  You have severe pain in your chest or abdomen.  You vomit  repeatedly.  You have trouble breathing. Summary  Hypertension is when the force of blood pumping through your arteries is too strong. If this condition is not controlled, it may put you at risk for serious complications.  Your personal target blood pressure may vary depending on your medical conditions, your age, and other factors. For most people, a normal blood pressure is less than 120/80.  Hypertension is treated with lifestyle changes, medicines, or a combination of both. Lifestyle changes include weight loss, eating a healthy, low-sodium diet, exercising more, and limiting alcohol. This information is not intended to replace advice given to you by your health care provider. Make sure you discuss any questions you have with your health care provider. Document Released: 05/04/2005 Document Revised: 04/01/2016 Document Reviewed: 04/01/2016 Elsevier Interactive Patient Education  2019 [employer]  
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
## 2018-06-14 DIAGNOSIS — F209 Schizophrenia, unspecified: Secondary | ICD-10-CM | POA: Diagnosis not present

## 2018-07-28 DIAGNOSIS — F4320 Adjustment disorder, unspecified: Secondary | ICD-10-CM | POA: Diagnosis not present

## 2018-08-11 DIAGNOSIS — F209 Schizophrenia, unspecified: Secondary | ICD-10-CM | POA: Diagnosis not present

## 2018-08-26 DIAGNOSIS — F209 Schizophrenia, unspecified: Secondary | ICD-10-CM | POA: Diagnosis not present

## 2018-09-20 DIAGNOSIS — F209 Schizophrenia, unspecified: Secondary | ICD-10-CM | POA: Diagnosis not present

## 2018-09-25 ENCOUNTER — Other Ambulatory Visit: Payer: Self-pay | Admitting: Internal Medicine

## 2018-09-25 ENCOUNTER — Other Ambulatory Visit: Payer: Self-pay | Admitting: Emergency Medicine

## 2018-09-25 DIAGNOSIS — I10 Essential (primary) hypertension: Secondary | ICD-10-CM

## 2018-09-26 DIAGNOSIS — H40013 Open angle with borderline findings, low risk, bilateral: Secondary | ICD-10-CM | POA: Diagnosis not present

## 2018-09-26 DIAGNOSIS — H2513 Age-related nuclear cataract, bilateral: Secondary | ICD-10-CM | POA: Diagnosis not present

## 2018-10-11 DIAGNOSIS — F209 Schizophrenia, unspecified: Secondary | ICD-10-CM | POA: Diagnosis not present

## 2018-10-21 DIAGNOSIS — F4320 Adjustment disorder, unspecified: Secondary | ICD-10-CM | POA: Diagnosis not present

## 2018-11-01 DIAGNOSIS — F209 Schizophrenia, unspecified: Secondary | ICD-10-CM | POA: Diagnosis not present

## 2018-11-27 ENCOUNTER — Other Ambulatory Visit: Payer: Self-pay | Admitting: Internal Medicine

## 2018-12-07 ENCOUNTER — Encounter: Payer: Self-pay | Admitting: Emergency Medicine

## 2018-12-07 ENCOUNTER — Ambulatory Visit (INDEPENDENT_AMBULATORY_CARE_PROVIDER_SITE_OTHER): Payer: Medicare Other | Admitting: Emergency Medicine

## 2018-12-07 ENCOUNTER — Other Ambulatory Visit: Payer: Self-pay

## 2018-12-07 VITALS — BP 130/74 | HR 58 | Temp 98.3°F | Resp 16 | Ht 74.0 in | Wt 205.2 lb

## 2018-12-07 DIAGNOSIS — Z8679 Personal history of other diseases of the circulatory system: Secondary | ICD-10-CM

## 2018-12-07 DIAGNOSIS — I10 Essential (primary) hypertension: Secondary | ICD-10-CM | POA: Diagnosis not present

## 2018-12-07 NOTE — Progress Notes (Signed)
Adam Peters 68 y.o.   Chief Complaint  Patient presents with   Hypertension    6 months    HISTORY OF PRESENT ILLNESS: This is a 68 y.o. male with history of hypertension here for follow-up.  Doing well.  Has no complaints or medical concerns. BP Readings from Last 3 Encounters:  12/07/18 130/74  06/07/18 116/72  01/14/18 138/77    HPI   Prior to Admission medications   Medication Sig Start Date End Date Taking? Authorizing Provider  alprostadil (EDEX) 20 MCG injection 20 mcg by Intracavitary route as needed for erectile dysfunction. use no more than 3 times per week   Yes [provider]  amLODipine (NORVASC) 2.5 MG tablet TAKE 1 TABLET DAILY 09/26/18  Yes [name], Nadean Corwin, MD  Aspirin-Calcium Carbonate [id number] MG TABS Take 1 tablet by mouth daily.   Yes [provider]  atorvastatin (LIPITOR) 40 MG tablet TAKE 1 TABLET DAILY 11/28/18  Yes [name], Nadean Corwin, MD  buPROPion (WELLBUTRIN) 75 MG tablet Take 75 mg by mouth daily.    Yes [provider]  calcipotriene (DOVONOX) 0.005 % cream Apply topically 2 (two) times daily.   Yes [provider]  dorzolamide-timolol (COSOPT) 22.3-6.8 MG/ML ophthalmic solution Place 1 drop into both eyes 2 (two) times daily. 05/03/13  Yes [provider]  lisinopril (PRINIVIL,ZESTRIL) 40 MG tablet TAKE 1 TABLET DAILY 01/25/18  Yes [name], Nadean Corwin, MD  nitroGLYCERIN (NITROSTAT) 0.4 MG SL tablet DISSOLVE 1 TABLET UNDER THE TONGUE EVERY 5 MINUTES AS NEEDED FOR CHEST PAIN. 09/26/18  Yes [name], Nadean Corwin, MD  olmesartan (BENICAR) 20 MG tablet TAKE 1 TABLET DAILY 09/26/18  Yes Loman Logan, Ines Bloomer, MD  omeprazole (PRILOSEC) 20 MG capsule TAKE 2 CAPSULES TWICE A DAY BEFORE MEALS 04/01/18  Yes [name], Nadean Corwin, MD  sildenafil (VIAGRA) 100 MG tablet TAKE 1 TABLET DAILY AS NEEDED FOR ERECTILE DYSFUNCTION (NOT TO USE MEDICATION WITH NITROGLYCERIN) 04/01/18  Yes [name], Nadean Corwin, MD  cetirizine (ZYRTEC) 10 MG  tablet Take 10 mg by mouth daily.    [provider]  montelukast (SINGULAIR) 10 MG tablet Take 10 mg by mouth at bedtime.    [provider]    Allergies  Allergen Reactions   Amoxicillin Diarrhea    GI upset.    Patient Active Problem List   Diagnosis Date Noted   Thrombocytopenia ([place]) 01/12/2018   Essential hypertension    Hyperlipidemia    History of tobacco use 07/08/2012   Glaucoma 04/08/2012   DDD (degenerative disc disease), cervical    CAD- LAD BMS '97, RCA DES 08/02/13 08/17/2011    Past Medical History:  Diagnosis Date   Allergy    seasonal   Anemia    Anxiety    CAD (coronary artery disease)    a. 1997 PCI/BMS to distal LAD ([facility] PS BMS);  b. 03/2011 Neg MV;  c. 07/2013 NSTEMI/PCI: LM 20d, LAD 58m, 50d isr, LCX nl, OM1/2 nl, RCA dom 95-81m (3.5x16 Promus premier DES), EF 55%, mild basal inf HK.   Cataract    Coronary artery disease    s/p PCI to LAD (1997) - Dr. Rockne Menghini   GERD (gastroesophageal reflux disease)    Hepatitis C    History of tobacco use    Hyperlipidemia    Hypertension    Schizophrenia ([place]) 1978    Past Surgical History:  Procedure Laterality Date   [place]  2001   patent  LAD stent (Dr Georges Lynch)   [place]  06/09/1995   prox LAD stenting 80% to 0% (Dr. Rockne Menghini) - repeat cath in 11/1999 showed no significant coronary disease & normal systolic function (Dr. Gerrie Nordmann)   CORONARY STENT PLACEMENT  08/02/2013   RCA   DES         DR Eclectic   knife wound repair  1969   LEFT HEART CATHETERIZATION WITH CORONARY ANGIOGRAM N/A 08/02/2013   Procedure: LEFT HEART CATHETERIZATION WITH CORONARY ANGIOGRAM;  Surgeon: Blane Ohara, MD;  Location: [facility] CATH LAB;  Service: Cardiovascular;  Laterality: N/A;   TRANSTHORACIC ECHOCARDIOGRAM  11/13/2008   EF 50-55%, normal LV systolic function; RA mildly dilated; trace MR/TR/PR     Social History   Socioeconomic History   Marital status: Married    Spouse name: Not on file   Number of children: Not on file   Years of education: B.A.   Highest education level: Not on file  Occupational History   Occupation: retired    Fish farm manager: [employer]  Social Needs   Emergency planning/management officer strain: Not on file   Food insecurity    Worry: Not on file    Inability: Not on file   Transportation needs    Medical: No    Non-medical: No  Tobacco Use   Smoking status: Former Smoker    Packs/day: 1.00    Types: Cigarettes    Quit date: 05/18/1993    Years since quitting: 25.5   Smokeless tobacco: Never Used  Substance and Sexual Activity   Alcohol use: Yes    Alcohol/week: 3.0 standard drinks    Types: 3 Glasses of wine per week    Comment: SOCIAL   Drug use: No   Sexual activity: Not Currently  Lifestyle   Physical activity    Days per week: 5 days    Minutes per session: 90 min   Stress: Only a little  Relationships   Press photographer on phone: Once a week    Gets together: Never    Attends religious service: Never    Active member of club or organization: Yes    Attends meetings of clubs or organizations: More than 4 times per year    Relationship status: Married   Intimate partner violence    Fear of current or ex partner: No    Emotionally abused: No    Physically abused: No    Forced sexual activity: No  Other Topics Concern   Not on file  Social History Narrative   Not on file    Family History  Problem Relation Age of Onset   Asthma Mother    Crohn's disease Mother    Heart disease Mother    Hepatitis C Brother    Testicular cancer Brother    Dementia Father    Diabetes Maternal Grandmother    Emphysema Maternal Aunt    Cancer Maternal Aunt    Colon cancer Neg Hx      Review of Systems  Constitutional: Negative.  Negative for chills and fever.  HENT: Negative.  Negative for congestion and  sore throat.   Eyes: Negative.   Respiratory: Negative for cough and shortness of breath.   Cardiovascular: Negative.  Negative for chest pain, palpitations and leg swelling.  Gastrointestinal: Negative.  Negative for abdominal pain, diarrhea, nausea and vomiting.  Genitourinary: Negative.   Skin: Negative.  Negative for  rash.  Neurological: Negative.  Negative for dizziness and headaches.  All other systems reviewed and are negative.    Vitals:   12/07/18 1052  BP: 130/74  Pulse: (!) 58  Resp: 16  Temp: 98.3 F (36.8 C)  SpO2: 98%     Physical Exam Vitals signs reviewed.  Constitutional:      Appearance: Normal appearance.  HENT:     Head: Normocephalic and atraumatic.  Eyes:     Extraocular Movements: Extraocular movements intact.     Conjunctiva/sclera: Conjunctivae normal.     Pupils: Pupils are equal, round, and reactive to light.  Neck:     Musculoskeletal: Normal range of motion and neck supple.  Cardiovascular:     Rate and Rhythm: Normal rate and regular rhythm.     Pulses: Normal pulses.     Heart sounds: Normal heart sounds.  Pulmonary:     Effort: Pulmonary effort is normal.     Breath sounds: Normal breath sounds.  Musculoskeletal:     Right lower leg: No edema.     Left lower leg: No edema.  Skin:    General: Skin is warm and dry.     Capillary Refill: Capillary refill takes less than 2 seconds.  Neurological:     General: No focal deficit present.     Mental Status: He is alert and oriented to person, place, and time.  Psychiatric:        Mood and Affect: Mood normal.        Behavior: Behavior normal.      ASSESSMENT & PLAN: Jesiah was seen today for hypertension.  Diagnoses and all orders for this visit:  Essential hypertension  History of coronary artery disease  Clinically stable.  Doing well.  Continue present medications.  No changes.  Follow-up in 6 months.  Patient Instructions       If you have lab work done today you  will be contacted with your lab results within the next 2 weeks.  If you have not heard from [place] then please contact us. The fastest way to get your results is to register for My Chart.   IF you received an x-ray today, you will receive an invoice from [facility]. Please contact [facility] at [phone] with questions or concerns regarding your invoice.   IF you received labwork today, you will receive an invoice from [place]. Please contact [facility] at [phone] with questions or concerns regarding your invoice.   Our billing staff will not be able to assist you with questions regarding bills from these companies.  You will be contacted with the lab results as soon as they are available. The fastest way to get your results is to activate your My Chart account. Instructions are located on the last page of this paperwork. If you have not heard from [place] regarding the results in 2 weeks, please contact this office.     Hypertension, Adult High blood pressure (hypertension) is when the force of blood pumping through the arteries is too strong. The arteries are the blood vessels that carry blood from the heart throughout the body. Hypertension forces the heart to work harder to pump blood and may cause arteries to become narrow or stiff. Untreated or uncontrolled hypertension can cause a heart attack, heart failure, a stroke, kidney disease, and other problems. A blood pressure Peters consists of a higher number over a lower number. Ideally, your blood pressure should be below 120/80. The first ("top") number is called  the systolic pressure. It is a measure of the pressure in your arteries as your heart beats. The second ("bottom") number is called the diastolic pressure. It is a measure of the pressure in your arteries as the heart relaxes. What are the causes? The exact cause of this condition is not known. There are some conditions that result in or are related to high blood  pressure. What increases the risk? Some risk factors for high blood pressure are under your control. The following factors may make you more likely to develop this condition:  Smoking.  Having type 2 diabetes mellitus, high cholesterol, or both.  Not getting enough exercise or physical activity.  Being overweight.  Having too much fat, sugar, calories, or salt (sodium) in your diet.  Drinking too much alcohol. Some risk factors for high blood pressure may be difficult or impossible to change. Some of these factors include:  Having chronic kidney disease.  Having a family history of high blood pressure.  Age. Risk increases with age.  Race. You may be at higher risk if you are African American.  Gender. Men are at higher risk than women before age 48. After age 67, women are at higher risk than men.  Having obstructive sleep apnea.  Stress. What are the signs or symptoms? High blood pressure may not cause symptoms. Very high blood pressure (hypertensive crisis) may cause:  Headache.  Anxiety.  Shortness of breath.  Nosebleed.  Nausea and vomiting.  Vision changes.  Severe chest pain.  Seizures. How is this diagnosed? This condition is diagnosed by measuring your blood pressure while you are seated, with your arm resting on a flat surface, your legs uncrossed, and your feet flat on the floor. The cuff of the blood pressure monitor will be placed directly against the skin of your upper arm at the level of your heart. It should be measured at least twice using the same arm. Certain conditions can cause a difference in blood pressure between your right and left arms. Certain factors can cause blood pressure readings to be lower or higher than normal for a short period of time:  When your blood pressure is higher when you are in a health care provider's office than when you are at home, this is called white coat hypertension. Most people with this condition do not need  medicines.  When your blood pressure is higher at home than when you are in a health care provider's office, this is called masked hypertension. Most people with this condition may need medicines to control blood pressure. If you have a high blood pressure Peters during one visit or you have normal blood pressure with other risk factors, you may be asked to:  Return on a different day to have your blood pressure checked again.  Monitor your blood pressure at home for 1 week or longer. If you are diagnosed with hypertension, you may have other blood or imaging tests to help your health care provider understand your overall risk for other conditions. How is this treated? This condition is treated by making healthy lifestyle changes, such as eating healthy foods, exercising more, and reducing your alcohol intake. Your health care provider may prescribe medicine if lifestyle changes are not enough to get your blood pressure under control, and if:  Your systolic blood pressure is above 130.  Your diastolic blood pressure is above 80. Your personal target blood pressure may vary depending on your medical conditions, your age, and other factors. Follow these  instructions at home: Eating and drinking   Eat a diet that is high in fiber and potassium, and low in sodium, added sugar, and fat. An example eating plan is called the DASH (Dietary Approaches to Stop Hypertension) diet. To eat this way: ? Eat plenty of fresh fruits and vegetables. Try to fill one half of your plate at each meal with fruits and vegetables. ? Eat whole grains, such as whole-wheat pasta, brown rice, or whole-grain bread. Fill about one fourth of your plate with whole grains. ? Eat or drink low-fat dairy products, such as skim milk or low-fat yogurt. ? Avoid fatty cuts of meat, processed or cured meats, and poultry with skin. Fill about one fourth of your plate with lean proteins, such as fish, chicken without skin, beans, eggs,  or tofu. ? Avoid pre-made and processed foods. These tend to be higher in sodium, added sugar, and fat.  Reduce your daily sodium intake. Most people with hypertension should eat less than 1,500 mg of sodium a day.  Do not drink alcohol if: ? Your health care provider tells you not to drink. ? You are pregnant, may be pregnant, or are planning to become pregnant.  If you drink alcohol: ? Limit how much you use to:  0-1 drink a day for women.  0-2 drinks a day for men. ? Be aware of how much alcohol is in your drink. In the [place] one drink equals one 12 oz bottle of beer (355 mL), one 5 oz glass of wine (148 mL), or one 1 oz glass of hard liquor (44 mL). Lifestyle   Work with your health care provider to maintain a healthy body weight or to lose weight. Ask what an ideal weight is for you.  Get at least 30 minutes of exercise most days of the week. Activities may include walking, swimming, or biking.  Include exercise to strengthen your muscles (resistance exercise), such as Pilates or lifting weights, as part of your weekly exercise routine. Try to do these types of exercises for 30 minutes at least 3 days a week.  Do not use any products that contain nicotine or tobacco, such as cigarettes, e-cigarettes, and chewing tobacco. If you need help quitting, ask your health care provider.  Monitor your blood pressure at home as told by your health care provider.  Keep all follow-up visits as told by your health care provider. This is important. Medicines  Take over-the-counter and prescription medicines only as told by your health care provider. Follow directions carefully. Blood pressure medicines must be taken as prescribed.  Do not skip doses of blood pressure medicine. Doing this puts you at risk for problems and can make the medicine less effective.  Ask your health care provider about side effects or reactions to medicines that you should watch for. Contact a health care  provider if you:  Think you are having a reaction to a medicine you are taking.  Have headaches that keep coming back (recurring).  Feel dizzy.  Have swelling in your ankles.  Have trouble with your vision. Get help right away if you:  Develop a severe headache or confusion.  Have unusual weakness or numbness.  Feel faint.  Have severe pain in your chest or abdomen.  Vomit repeatedly.  Have trouble breathing. Summary  Hypertension is when the force of blood pumping through your arteries is too strong. If this condition is not controlled, it may put you at risk for serious complications.  Your personal  target blood pressure may vary depending on your medical conditions, your age, and other factors. For most people, a normal blood pressure is less than 120/80.  Hypertension is treated with lifestyle changes, medicines, or a combination of both. Lifestyle changes include losing weight, eating a healthy, low-sodium diet, exercising more, and limiting alcohol. This information is not intended to replace advice given to you by your health care provider. Make sure you discuss any questions you have with your health care provider. Document Released: 05/04/2005 Document Revised: 01/12/2018 Document Reviewed: 01/12/2018 Elsevier Patient Education  2020 [employer]      Agustina Caroli, MD [facility]

## 2018-12-07 NOTE — Patient Instructions (Addendum)
   If you have lab work done today you will be contacted with your lab results within the next 2 weeks.  If you have not heard from us then please contact us. The fastest way to get your results is to register for My Chart.   IF you received an x-ray today, you will receive an invoice from [facility]. Please contact [facility] at [phone] with questions or concerns regarding your invoice.   IF you received labwork today, you will receive an invoice from [facility]. Please contact [facility] at [phone] with questions or concerns regarding your invoice.   Our billing staff will not be able to assist you with questions regarding bills from these companies.  You will be contacted with the lab results as soon as they are available. The fastest way to get your results is to activate your My Chart account. Instructions are located on the last page of this paperwork. If you have not heard from us regarding the results in 2 weeks, please contact this office.     Hypertension, Adult High blood pressure (hypertension) is when the force of blood pumping through the arteries is too strong. The arteries are the blood vessels that carry blood from the heart throughout the body. Hypertension forces the heart to work harder to pump blood and may cause arteries to become narrow or stiff. Untreated or uncontrolled hypertension can cause a heart attack, heart failure, a stroke, kidney disease, and other problems. A blood pressure reading consists of a higher number over a lower number. Ideally, your blood pressure should be below 120/80. The first ("top") number is called the systolic pressure. It is a measure of the pressure in your arteries as your heart beats. The second ("bottom") number is called the diastolic pressure. It is a measure of the pressure in your arteries as the heart relaxes. What are the causes? The exact cause of this condition is not known. There are some conditions  that result in or are related to high blood pressure. What increases the risk? Some risk factors for high blood pressure are under your control. The following factors may make you more likely to develop this condition:  Smoking.  Having type 2 diabetes mellitus, high cholesterol, or both.  Not getting enough exercise or physical activity.  Being overweight.  Having too much fat, sugar, calories, or salt (sodium) in your diet.  Drinking too much alcohol. Some risk factors for high blood pressure may be difficult or impossible to change. Some of these factors include:  Having chronic kidney disease.  Having a family history of high blood pressure.  Age. Risk increases with age.  Race. You may be at higher risk if you are African American.  Gender. Men are at higher risk than women before age 45. After age 65, women are at higher risk than men.  Having obstructive sleep apnea.  Stress. What are the signs or symptoms? High blood pressure may not cause symptoms. Very high blood pressure (hypertensive crisis) may cause:  Headache.  Anxiety.  Shortness of breath.  Nosebleed.  Nausea and vomiting.  Vision changes.  Severe chest pain.  Seizures. How is this diagnosed? This condition is diagnosed by measuring your blood pressure while you are seated, with your arm resting on a flat surface, your legs uncrossed, and your feet flat on the floor. The cuff of the blood pressure monitor will be placed directly against the skin of your upper arm at the level of your heart.   It should be measured at least twice using the same arm. Certain conditions can cause a difference in blood pressure between your right and left arms. Certain factors can cause blood pressure readings to be lower or higher than normal for a short period of time:  When your blood pressure is higher when you are in a health care provider's office than when you are at home, this is called white coat hypertension.  Most people with this condition do not need medicines.  When your blood pressure is higher at home than when you are in a health care provider's office, this is called masked hypertension. Most people with this condition may need medicines to control blood pressure. If you have a high blood pressure reading during one visit or you have normal blood pressure with other risk factors, you may be asked to:  Return on a different day to have your blood pressure checked again.  Monitor your blood pressure at home for 1 week or longer. If you are diagnosed with hypertension, you may have other blood or imaging tests to help your health care provider understand your overall risk for other conditions. How is this treated? This condition is treated by making healthy lifestyle changes, such as eating healthy foods, exercising more, and reducing your alcohol intake. Your health care provider may prescribe medicine if lifestyle changes are not enough to get your blood pressure under control, and if:  Your systolic blood pressure is above 130.  Your diastolic blood pressure is above 80. Your personal target blood pressure may vary depending on your medical conditions, your age, and other factors. Follow these instructions at home: Eating and drinking   Eat a diet that is high in fiber and potassium, and low in sodium, added sugar, and fat. An example eating plan is called the DASH (Dietary Approaches to Stop Hypertension) diet. To eat this way: ? Eat plenty of fresh fruits and vegetables. Try to fill one half of your plate at each meal with fruits and vegetables. ? Eat whole grains, such as whole-wheat pasta, brown rice, or whole-grain bread. Fill about one fourth of your plate with whole grains. ? Eat or drink low-fat dairy products, such as skim milk or low-fat yogurt. ? Avoid fatty cuts of meat, processed or cured meats, and poultry with skin. Fill about one fourth of your plate with lean proteins, such  as fish, chicken without skin, beans, eggs, or tofu. ? Avoid pre-made and processed foods. These tend to be higher in sodium, added sugar, and fat.  Reduce your daily sodium intake. Most people with hypertension should eat less than 1,500 mg of sodium a day.  Do not drink alcohol if: ? Your health care provider tells you not to drink. ? You are pregnant, may be pregnant, or are planning to become pregnant.  If you drink alcohol: ? Limit how much you use to:  0-1 drink a day for women.  0-2 drinks a day for men. ? Be aware of how much alcohol is in your drink. In the [place] one drink equals one 12 oz bottle of beer (355 mL), one 5 oz glass of wine (148 mL), or one 1 oz glass of hard liquor (44 mL). Lifestyle   Work with your health care provider to maintain a healthy body weight or to lose weight. Ask what an ideal weight is for you.  Get at least 30 minutes of exercise most days of the week. Activities may include walking, swimming, or   biking.  Include exercise to strengthen your muscles (resistance exercise), such as Pilates or lifting weights, as part of your weekly exercise routine. Try to do these types of exercises for 30 minutes at least 3 days a week.  Do not use any products that contain nicotine or tobacco, such as cigarettes, e-cigarettes, and chewing tobacco. If you need help quitting, ask your health care provider.  Monitor your blood pressure at home as told by your health care provider.  Keep all follow-up visits as told by your health care provider. This is important. Medicines  Take over-the-counter and prescription medicines only as told by your health care provider. Follow directions carefully. Blood pressure medicines must be taken as prescribed.  Do not skip doses of blood pressure medicine. Doing this puts you at risk for problems and can make the medicine less effective.  Ask your health care provider about side effects or reactions to medicines that you  should watch for. Contact a health care provider if you:  Think you are having a reaction to a medicine you are taking.  Have headaches that keep coming back (recurring).  Feel dizzy.  Have swelling in your ankles.  Have trouble with your vision. Get help right away if you:  Develop a severe headache or confusion.  Have unusual weakness or numbness.  Feel faint.  Have severe pain in your chest or abdomen.  Vomit repeatedly.  Have trouble breathing. Summary  Hypertension is when the force of blood pumping through your arteries is too strong. If this condition is not controlled, it may put you at risk for serious complications.  Your personal target blood pressure may vary depending on your medical conditions, your age, and other factors. For most people, a normal blood pressure is less than 120/80.  Hypertension is treated with lifestyle changes, medicines, or a combination of both. Lifestyle changes include losing weight, eating a healthy, low-sodium diet, exercising more, and limiting alcohol. This information is not intended to replace advice given to you by your health care provider. Make sure you discuss any questions you have with your health care provider. Document Released: 05/04/2005 Document Revised: 01/12/2018 Document Reviewed: 01/12/2018 Elsevier Patient Education  2020 [employer]  

## 2018-12-08 DIAGNOSIS — F4320 Adjustment disorder, unspecified: Secondary | ICD-10-CM | POA: Diagnosis not present

## 2018-12-10 ENCOUNTER — Other Ambulatory Visit: Payer: Self-pay | Admitting: Emergency Medicine

## 2018-12-10 DIAGNOSIS — I10 Essential (primary) hypertension: Secondary | ICD-10-CM

## 2018-12-10 NOTE — Telephone Encounter (Signed)
Requested Prescriptions  Pending Prescriptions Disp Refills  . olmesartan (BENICAR) 20 MG tablet [Pharmacy Med Name: OLMESARTAN MEDOXOMIL TABS 20MG ] 90 tablet 1    Sig: TAKE 1 TABLET DAILY     Cardiovascular:  Angiotensin Receptor Blockers Failed - 12/10/2018 10:15 AM      Failed - Cr in normal range and within 180 days    Creat  Date Value Ref Range Status  08/25/2016 1.24 0.70 - 1.25 mg/dL Final    Comment:      For patients > or = 69 years of age: The upper reference limit for Creatinine is approximately 13% higher for people identified as African-American.      Creatinine, Ser  Date Value Ref Range Status  01/14/2018 0.97 0.76 - 1.27 mg/dL Final         Failed - K in normal range and within 180 days    Potassium  Date Value Ref Range Status  01/14/2018 4.1 3.5 - 5.2 mmol/L Final         Passed - Patient is not pregnant      Passed - Last BP in normal range    BP Readings from Last 1 Encounters:  12/07/18 130/74         Passed - Valid encounter within last 6 months    Recent Outpatient Visits          3 days ago Essential hypertension   [facility], Ines Bloomer, MD   6 months ago Essential hypertension   [facility] at [facility], [place], [place]   11 months ago Cough   [facility] at [facility], Ines Bloomer, MD   1 year ago Cough   [facility] at [facility], Ines Bloomer, MD   1 year ago Routine general medical examination at a health care facility   [place] [facility], Ines Bloomer, MD      Future Appointments            In 1 month [name], Nadean Corwin, MD [place], [facility]   In 5 months Sagardia, Ines Bloomer, MD [facility] [place], [facility]

## 2019-01-04 DIAGNOSIS — F209 Schizophrenia, unspecified: Secondary | ICD-10-CM | POA: Diagnosis not present

## 2019-01-10 ENCOUNTER — Encounter: Payer: Self-pay | Admitting: Internal Medicine

## 2019-01-10 ENCOUNTER — Other Ambulatory Visit: Payer: Self-pay

## 2019-01-10 ENCOUNTER — Ambulatory Visit (INDEPENDENT_AMBULATORY_CARE_PROVIDER_SITE_OTHER): Payer: Medicare Other | Admitting: Internal Medicine

## 2019-01-10 VITALS — BP 144/83 | HR 59 | Ht 74.0 in | Wt 206.0 lb

## 2019-01-10 DIAGNOSIS — I10 Essential (primary) hypertension: Secondary | ICD-10-CM

## 2019-01-10 DIAGNOSIS — I2510 Atherosclerotic heart disease of native coronary artery without angina pectoris: Secondary | ICD-10-CM | POA: Diagnosis not present

## 2019-01-10 DIAGNOSIS — Z5181 Encounter for therapeutic drug level monitoring: Secondary | ICD-10-CM | POA: Diagnosis not present

## 2019-01-10 DIAGNOSIS — M7582 Other shoulder lesions, left shoulder: Secondary | ICD-10-CM

## 2019-01-10 DIAGNOSIS — E785 Hyperlipidemia, unspecified: Secondary | ICD-10-CM | POA: Diagnosis not present

## 2019-01-10 LAB — LIPID PANEL
Chol/HDL Ratio: 2.4 ratio (ref 0.0–5.0)
Cholesterol, Total: 138 mg/dL (ref 100–199)
HDL: 58 mg/dL (ref >39)
LDL Calculated: 71 mg/dL (ref 0–99)
Triglycerides: 43 mg/dL (ref 0–149)
VLDL Cholesterol Cal: 9 mg/dL (ref 5–40)

## 2019-01-10 LAB — HEPATIC FUNCTION PANEL
ALT: 15 IU/L (ref 0–44)
AST: 16 IU/L (ref 0–40)
Albumin: 4.7 g/dL (ref 3.8–4.8)
Alkaline Phosphatase: 74 IU/L (ref 39–117)
Bilirubin Total: 0.8 mg/dL (ref 0.0–1.2)
Bilirubin, Direct: 0.23 mg/dL (ref 0.00–0.40)
Total Protein: 7.3 g/dL (ref 6.0–8.5)

## 2019-01-10 NOTE — Patient Instructions (Addendum)
Medication Instructions:  Your physician recommends that you continue on your current medications as directed. Please refer to the Current Medication list given to you today.  If you need a refill on your cardiac medications before your next appointment, please call your pharmacy.   Lab work: FASTING LP/HFP  If you have labs (blood work) drawn today and your tests are completely normal, you will receive your results only by: Marland Kitchen MyChart Message (if you have MyChart) OR . A paper copy in the mail If you have any lab test that is abnormal or we need to change your treatment, we will call you to review the results.  Testing/Procedures: NONE  Follow-Up: At [facility], you and your health needs are our priority.  As part of our continuing mission to provide you with exceptional heart care, we have created designated Provider Care Teams.  These Care Teams include your primary Cardiologist (physician) and Advanced Practice Providers (APPs -  Physician Assistants and Nurse Practitioners) who all work together to provide you with the care you need, when you need it. You will need a follow up appointment in 12 months.  Please call our office 2 months in advance to schedule this appointment.  You may see DR [name]  or one of the following Advanced Practice Providers on your designated Care Team: Almyra Deforest, [place] . Fabian Sharp, PA-C  Any Other Special Instructions Will Be Listed Below (If Applicable).  YOU ARE SCHEDULED TO SEE DR SUPPLE 01/20/2019 AT 2:15 PM,  [id number] IF YOU NEED TO RESCHEDULE
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
## 2019-01-18 DIAGNOSIS — F4320 Adjustment disorder, unspecified: Secondary | ICD-10-CM | POA: Diagnosis not present

## 2019-01-20 DIAGNOSIS — M25512 Pain in left shoulder: Secondary | ICD-10-CM | POA: Diagnosis not present

## 2019-01-22 ENCOUNTER — Encounter: Payer: Self-pay | Admitting: Emergency Medicine

## 2019-01-24 ENCOUNTER — Encounter: Payer: Self-pay | Admitting: Emergency Medicine

## 2019-01-31 ENCOUNTER — Encounter: Payer: Self-pay | Admitting: Emergency Medicine

## 2019-01-31 ENCOUNTER — Ambulatory Visit (INDEPENDENT_AMBULATORY_CARE_PROVIDER_SITE_OTHER): Payer: Medicare Other | Admitting: Emergency Medicine

## 2019-01-31 ENCOUNTER — Other Ambulatory Visit: Payer: Self-pay

## 2019-01-31 VITALS — BP 133/75 | HR 63 | Temp 98.4°F | Resp 16 | Ht 74.0 in | Wt 209.0 lb

## 2019-01-31 DIAGNOSIS — Z23 Encounter for immunization: Secondary | ICD-10-CM

## 2019-01-31 DIAGNOSIS — J309 Allergic rhinitis, unspecified: Secondary | ICD-10-CM

## 2019-01-31 DIAGNOSIS — Z8679 Personal history of other diseases of the circulatory system: Secondary | ICD-10-CM

## 2019-01-31 DIAGNOSIS — L989 Disorder of the skin and subcutaneous tissue, unspecified: Secondary | ICD-10-CM | POA: Diagnosis not present

## 2019-01-31 DIAGNOSIS — I10 Essential (primary) hypertension: Secondary | ICD-10-CM

## 2019-01-31 DIAGNOSIS — I2510 Atherosclerotic heart disease of native coronary artery without angina pectoris: Secondary | ICD-10-CM | POA: Diagnosis not present
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
## 2019-01-31 NOTE — Patient Instructions (Addendum)
   If you have lab work done today you will be contacted with your lab results within the next 2 weeks.  If you have not heard from us then please contact us. The fastest way to get your results is to register for My Chart.   IF you received an x-ray today, you will receive an invoice from [facility]. Please contact [facility] at [phone] with questions or concerns regarding your invoice.   IF you received labwork today, you will receive an invoice from [facility]. Please contact [facility] at [phone] with questions or concerns regarding your invoice.   Our billing staff will not be able to assist you with questions regarding bills from these companies.  You will be contacted with the lab results as soon as they are available. The fastest way to get your results is to activate your My Chart account. Instructions are located on the last page of this paperwork. If you have not heard from us regarding the results in 2 weeks, please contact this office.     Health Maintenance After Age 65 After age 65, you are at a higher risk for certain long-term diseases and infections as well as injuries from falls. Falls are a major cause of broken bones and head injuries in people who are older than age 65. Getting regular preventive care can help to keep you healthy and well. Preventive care includes getting regular testing and making lifestyle changes as recommended by your health care provider. Talk with your health care provider about:  Which screenings and tests you should have. A screening is a test that checks for a disease when you have no symptoms.  A diet and exercise plan that is right for you. What should I know about screenings and tests to prevent falls? Screening and testing are the best ways to find a health problem early. Early diagnosis and treatment give you the best chance of managing medical conditions that are common after age 65. Certain conditions and  lifestyle choices may make you more likely to have a fall. Your health care provider may recommend:  Regular vision checks. Poor vision and conditions such as cataracts can make you more likely to have a fall. If you wear glasses, make sure to get your prescription updated if your vision changes.  Medicine review. Work with your health care provider to regularly review all of the medicines you are taking, including over-the-counter medicines. Ask your health care provider about any side effects that may make you more likely to have a fall. Tell your health care provider if any medicines that you take make you feel dizzy or sleepy.  Osteoporosis screening. Osteoporosis is a condition that causes the bones to get weaker. This can make the bones weak and cause them to break more easily.  Blood pressure screening. Blood pressure changes and medicines to control blood pressure can make you feel dizzy.  Strength and balance checks. Your health care provider may recommend certain tests to check your strength and balance while standing, walking, or changing positions.  Foot health exam. Foot pain and numbness, as well as not wearing proper footwear, can make you more likely to have a fall.  Depression screening. You may be more likely to have a fall if you have a fear of falling, feel emotionally low, or feel unable to do activities that you used to do.  Alcohol use screening. Using too much alcohol can affect your balance and may make you more likely to   have a fall. What actions can I take to lower my risk of falls? General instructions  Talk with your health care provider about your risks for falling. Tell your health care provider if: ? You fall. Be sure to tell your health care provider about all falls, even ones that seem minor. ? You feel dizzy, sleepy, or off-balance.  Take over-the-counter and prescription medicines only as told by your health care provider. These include any  supplements.  Eat a healthy diet and maintain a healthy weight. A healthy diet includes low-fat dairy products, low-fat (lean) meats, and fiber from whole grains, beans, and lots of fruits and vegetables. Home safety  Remove any tripping hazards, such as rugs, cords, and clutter.  Install safety equipment such as grab bars in bathrooms and safety rails on stairs.  Keep rooms and walkways well-lit. Activity   Follow a regular exercise program to stay fit. This will help you maintain your balance. Ask your health care provider what types of exercise are appropriate for you.  If you need a cane or walker, use it as recommended by your health care provider.  Wear supportive shoes that have nonskid soles. Lifestyle  Do not drink alcohol if your health care provider tells you not to drink.  If you drink alcohol, limit how much you have: ? 0-1 drink a day for women. ? 0-2 drinks a day for men.  Be aware of how much alcohol is in your drink. In the [place] one drink equals one typical bottle of beer (12 oz), one-half glass of wine (5 oz), or one shot of hard liquor (1 oz).  Do not use any products that contain nicotine or tobacco, such as cigarettes and e-cigarettes. If you need help quitting, ask your health care provider. Summary  Having a healthy lifestyle and getting preventive care can help to protect your health and wellness after age 65.  Screening and testing are the best way to find a health problem early and help you avoid having a fall. Early diagnosis and treatment give you the best chance for managing medical conditions that are more common for people who are older than age 65.  Falls are a major cause of broken bones and head injuries in people who are older than age 65. Take precautions to prevent a fall at home.  Work with your health care provider to learn what changes you can make to improve your health and wellness and to prevent falls. This information is not intended  to replace advice given to you by your health care provider. Make sure you discuss any questions you have with your health care provider. Document Released: 03/17/2017 Document Revised: 08/25/2018 Document Reviewed: 03/17/2017 Elsevier Patient Education  2020 Elsevier Inc.  

## 2019-02-20 DIAGNOSIS — M25512 Pain in left shoulder: Secondary | ICD-10-CM | POA: Diagnosis not present

## 2019-02-20 DIAGNOSIS — M7542 Impingement syndrome of left shoulder: Secondary | ICD-10-CM | POA: Diagnosis not present

## 2019-02-21 ENCOUNTER — Encounter: Payer: Self-pay | Admitting: Allergy & Immunology

## 2019-02-21 ENCOUNTER — Ambulatory Visit (INDEPENDENT_AMBULATORY_CARE_PROVIDER_SITE_OTHER): Payer: Medicare Other | Admitting: Allergy & Immunology

## 2019-02-21 ENCOUNTER — Other Ambulatory Visit: Payer: Self-pay

## 2019-02-21 VITALS — BP 144/76 | HR 57 | Temp 97.9°F | Resp 16 | Ht 74.0 in | Wt 212.4 lb

## 2019-02-21 DIAGNOSIS — I2510 Atherosclerotic heart disease of native coronary artery without angina pectoris: Secondary | ICD-10-CM | POA: Diagnosis not present

## 2019-02-21 DIAGNOSIS — J3089 Other allergic rhinitis: Secondary | ICD-10-CM | POA: Diagnosis not present

## 2019-02-21 DIAGNOSIS — J302 Other seasonal allergic rhinitis: Secondary | ICD-10-CM | POA: Diagnosis not present

## 2019-02-21 DIAGNOSIS — J4520 Mild intermittent asthma, uncomplicated: Secondary | ICD-10-CM | POA: Diagnosis not present

## 2019-02-21 MED ORDER — AZELASTINE HCL 0.1 % NA SOLN: 2.0000 | Freq: Two times a day (BID) | NASAL | 1 refills | Status: DC | PRN

## 2019-02-21 NOTE — Patient Instructions (Addendum)
1. Mild intermittent asthma, uncomplicated - We did not do lung testing since your breathing seems well controlled. - There does not seem to be a need for a controller medication at this time. - We will send in an albuterol so that you can have it on hand if needed.  2. Seasonal and perennial allergic rhinitis - Testing today showed: grasses, ragweed, trees, indoor molds, outdoor molds, cat and dog - Copy of test results provided.  - Avoidance measures provided. - Continue with: Allegra (fexofenadine) 180mg  table once daily and Flonase (fluticasone) two sprays per nostril daily - Start taking: Astelin (azelastine) 2 sprays per nostril 1-2 times daily as needed - You can use an extra dose of the antihistamine, if needed, for breakthrough symptoms.  - Consider nasal saline rinses 1-2 times daily to remove allergens from the nasal cavities as well as help with mucous clearance (this is especially helpful to do before the nasal sprays are given) - Consider allergy shots as a means of long-term control. - Allergy shots "re-train" and "reset" the immune system to ignore environmental allergens and decrease the resulting immune response to those allergens (sneezing, itchy watery eyes, runny nose, nasal congestion, etc).    - Allergy shots improve symptoms in 75-85% of patients.  - We can discuss more at the next appointment if the medications are not working for you.  3. Return in about 6 weeks (around 04/04/2019). This can be an in-person, a virtual Webex or a telephone follow up visit.   Please inform us of any Emergency Department visits, hospitalizations, or changes in symptoms. Call us before going to the ED for breathing or allergy symptoms since we might be able to fit you in for a sick visit. Feel free to contact us anytime with any questions, problems, or concerns.  It was a pleasure to meet you today!  Websites that have reliable patient information: 1. American Academy of Asthma,  Allergy, and Immunology: www.aaaai.org 2. Food Allergy Research and Education (FARE): foodallergy.org 3. Mothers of Asthmatics: http://www.asthmacommunitynetwork.org 4. American College of Allergy, Asthma, and Immunology: www.acaai.org  Like [place] on [place] and Instagram for our latest updates!      Make sure you are registered to vote! If you have moved or changed any of your contact information, you will need to get this updated before voting!  In some cases, you MAY be able to register to vote online: [email or web address]    Voter ID laws are NOT going into effect for the General Election in November 2020! DO NOT let this stop you from exercising your right to vote!   Absentee voting is the SAFEST way to vote during the coronavirus pandemic!   Download and print an absentee ballot request form at [email or web address] or you can scan the QR code below with your smart phone:      More information on absentee ballots can be found here: [email or web address]  [place] VOTING SITES have been established! You can register to vote and cast your vote on the same day at these locations. See this site for more information on what you need to register to vote: [email or web address]      Reducing Pollen Exposure  The American Academy of Allergy, Asthma and Immunology suggests the following steps to reduce your exposure to pollen during allergy seasons.    1. Do not hang sheets or clothing out to dry; pollen may collect on these items. 2. Do not mow lawns or spend time around freshly cut grass; mowing  stirs up pollen. 3. Keep windows closed at night.  Keep car windows closed while driving. 4. Minimize morning activities outdoors, a time when pollen counts are usually at their highest. 5. Stay indoors as much as possible when pollen counts or humidity is high and on windy days when pollen tends to remain  in the air longer. 6. Use air conditioning when possible.  Many air conditioners have filters that trap the pollen spores. 7. Use a HEPA room air filter to remove pollen form the indoor air you breathe.  Control of Mold Allergen   Mold and fungi can grow on a variety of surfaces provided certain temperature and moisture conditions exist.  Outdoor molds grow on plants, decaying vegetation and soil.  The major outdoor mold, Alternaria and Cladosporium, are found in very high numbers during hot and dry conditions.  Generally, a late Summer - Fall peak is seen for common outdoor fungal spores.  Rain will temporarily lower outdoor mold spore count, but counts rise rapidly when the rainy period ends.  The most important indoor molds are Aspergillus and Penicillium.  Dark, humid and poorly ventilated basements are ideal sites for mold growth.  The next most common sites of mold growth are the bathroom and the kitchen.  Outdoor (Seasonal) Mold Control  Positive outdoor molds via skin testing: Alternaria and Cladosporium  1. Use air conditioning and keep windows closed 2. Avoid exposure to decaying vegetation. 3. Avoid leaf raking. 4. Avoid grain handling. 5. Consider wearing a face mask if working in moldy areas.  6.   Indoor (Perennial) Mold Control   Positive indoor molds via skin testing: Fusarium, Aureobasidium ([place]a) and Rhizopus  1. Maintain humidity below 50%. 2. Clean washable surfaces with 5% bleach solution. 3. Remove sources e.g. contaminated carpets.     Control of Dog or Cat Allergen  Avoidance is the best way to manage a dog or cat allergy. If you have a dog or cat and are allergic to dog or cats, consider removing the dog or cat from the home. If you have a dog or cat but dont want to find it a new home, or if your family wants a pet even though someone in the household is allergic, here are some strategies that may help keep symptoms at bay:  1. Keep the pet out of  your bedroom and restrict it to only a few rooms. Be advised that keeping the dog or cat in only one room will not limit the allergens to that room. 2. Dont pet, hug or kiss the dog or cat; if you do, wash your hands with soap and water. 3. High-efficiency particulate air (HEPA) cleaners run continuously in a bedroom or living room can reduce allergen levels over time. 4. Regular use of a high-efficiency vacuum cleaner or a central vacuum can reduce allergen levels. 5. Giving your dog or cat a bath at least once a week can reduce airborne allergen.  Allergy Shots   Allergies are the result of a chain reaction that starts in the immune system. Your immune system controls how your body defends itself. For instance, if you have an allergy to pollen, your immune system identifies pollen as an invader or allergen. Your immune system overreacts by producing antibodies called Immunoglobulin E (IgE). These antibodies travel to cells that release chemicals, causing an allergic reaction.  The concept behind allergy immunotherapy, whether it is received in the form of shots or tablets, is that the immune system can be desensitized  to specific allergens that trigger allergy symptoms. Although it requires time and patience, the payback can be long-term relief.  How Do Allergy Shots Work?  Allergy shots work much like a vaccine. Your body responds to injected amounts of a particular allergen given in increasing doses, eventually developing a resistance and tolerance to it. Allergy shots can lead to decreased, minimal or no allergy symptoms.  There generally are two phases: build-up and maintenance. Build-up often ranges from three to six months and involves receiving injections with increasing amounts of the allergens. The shots are typically given once or twice a week, though more rapid build-up schedules are sometimes used.  The maintenance phase begins when the most effective dose is reached. This dose is  different for each person, depending on how allergic you are and your response to the build-up injections. Once the maintenance dose is reached, there are longer periods between injections, typically two to four weeks.  Occasionally doctors give cortisone-type shots that can temporarily reduce allergy symptoms. These types of shots are different and should not be confused with allergy immunotherapy shots.  Who Can Be Treated with Allergy Shots?  Allergy shots may be a good treatment approach for people with allergic rhinitis (hay fever), allergic asthma, conjunctivitis (eye allergy) or stinging insect allergy.   Before deciding to begin allergy shots, you should consider:   The length of allergy season and the severity of your symptoms  Whether medications and/or changes to your environment can control your symptoms  Your desire to avoid long-term medication use  Time: allergy immunotherapy requires a major time commitment  Cost: may vary depending on your insurance coverage  Allergy shots for children age 28 and older are effective and often well tolerated. They might prevent the onset of new allergen sensitivities or the progression to asthma.  Allergy shots are not started on patients who are pregnant but can be continued on patients who become pregnant while receiving them. In some patients with other medical conditions or who take certain common medications, allergy shots may be of risk. It is important to mention other medications you talk to your allergist.   When Will I Feel Better?  Some may experience decreased allergy symptoms during the build-up phase. For others, it may take as long as 12 months on the maintenance dose. If there is no improvement after a year of maintenance, your allergist will discuss other treatment options with you.  If you arent responding to allergy shots, it may be because there is not enough dose of the allergen in your vaccine or there are missing  allergens that were not identified during your allergy testing. Other reasons could be that there are high levels of the allergen in your environment or major exposure to non-allergic triggers like tobacco smoke.  What Is the Length of Treatment?  Once the maintenance dose is reached, allergy shots are generally continued for three to five years. The decision to stop should be discussed with your allergist at that time. Some people may experience a permanent reduction of allergy symptoms. Others may relapse and a longer course of allergy shots can be considered.  What Are the Possible Reactions?  The two types of adverse reactions that can occur with allergy shots are local and systemic. Common local reactions include very mild redness and swelling at the injection site, which can happen immediately or several hours after. A systemic reaction, which is less common, affects the entire body or a particular body system. They are  usually mild and typically respond quickly to medications. Signs include increased allergy symptoms such as sneezing, a stuffy nose or hives.  Rarely, a serious systemic reaction called anaphylaxis can develop. Symptoms include swelling in the throat, wheezing, a feeling of tightness in the chest, nausea or dizziness. Most serious systemic reactions develop within 30 minutes of allergy shots. This is why it is strongly recommended you wait in your doctors office for 30 minutes after your injections. Your allergist is trained to watch for reactions, and his or her staff is trained and equipped with the proper medications to identify and treat them.  Who Should Administer Allergy Shots?  The preferred location for receiving shots is your prescribing allergists office. Injections can sometimes be given at another facility where the physician and staff are trained to recognize and treat reactions, and have received instructions by your prescribing allergist.
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
## 2019-02-25 ENCOUNTER — Other Ambulatory Visit: Payer: Self-pay | Admitting: Internal Medicine

## 2019-03-04 ENCOUNTER — Encounter: Payer: Self-pay | Admitting: Emergency Medicine

## 2019-03-05 ENCOUNTER — Other Ambulatory Visit: Payer: Self-pay | Admitting: Internal Medicine

## 2019-03-09 ENCOUNTER — Encounter: Payer: Self-pay | Admitting: Allergy & Immunology

## 2019-03-10 ENCOUNTER — Other Ambulatory Visit: Payer: Self-pay | Admitting: *Deleted

## 2019-03-10 MED ORDER — FLUTICASONE PROPIONATE 50 MCG/ACT NA SUSP: 2.0000 | Freq: Every day | NASAL | 1 refills | Status: DC

## 2019-03-27 DIAGNOSIS — L209 Atopic dermatitis, unspecified: Secondary | ICD-10-CM | POA: Diagnosis not present

## 2019-04-04 ENCOUNTER — Other Ambulatory Visit: Payer: Self-pay

## 2019-04-04 ENCOUNTER — Other Ambulatory Visit: Payer: Self-pay | Admitting: Allergy

## 2019-04-04 ENCOUNTER — Ambulatory Visit (INDEPENDENT_AMBULATORY_CARE_PROVIDER_SITE_OTHER): Payer: Medicare Other | Admitting: Allergy

## 2019-04-04 ENCOUNTER — Encounter: Payer: Self-pay | Admitting: Allergy

## 2019-04-04 VITALS — BP 180/70 | HR 70 | Temp 97.6°F | Resp 18 | Ht 74.0 in

## 2019-04-04 DIAGNOSIS — Z87891 Personal history of nicotine dependence: Secondary | ICD-10-CM | POA: Diagnosis not present

## 2019-04-04 DIAGNOSIS — J302 Other seasonal allergic rhinitis: Secondary | ICD-10-CM | POA: Insufficient documentation

## 2019-04-04 DIAGNOSIS — J4530 Mild persistent asthma, uncomplicated: Secondary | ICD-10-CM | POA: Diagnosis not present

## 2019-04-04 DIAGNOSIS — J3089 Other allergic rhinitis: Secondary | ICD-10-CM | POA: Diagnosis not present

## 2019-04-04 DIAGNOSIS — R059 Cough, unspecified: Secondary | ICD-10-CM

## 2019-04-04 DIAGNOSIS — R05 Cough: Secondary | ICD-10-CM | POA: Diagnosis not present

## 2019-04-04 DIAGNOSIS — I2510 Atherosclerotic heart disease of native coronary artery without angina pectoris: Secondary | ICD-10-CM

## 2019-04-04 MED ORDER — FLOVENT HFA 110 MCG/ACT IN AERO: 2.0000 | INHALATION_SPRAY | Freq: Two times a day (BID) | RESPIRATORY_TRACT | 1 refills | Status: DC

## 2019-04-04 MED ORDER — ALBUTEROL SULFATE HFA 108 (90 BASE) MCG/ACT IN AERS: 2.0000 | INHALATION_SPRAY | Freq: Four times a day (QID) | RESPIRATORY_TRACT | 1 refills | Status: DC | PRN

## 2019-04-04 MED ORDER — ALBUTEROL SULFATE HFA 108 (90 BASE) MCG/ACT IN AERS: 2.0000 | INHALATION_SPRAY | Freq: Four times a day (QID) | RESPIRATORY_TRACT | 0 refills | Status: DC | PRN

## 2019-04-04 NOTE — Assessment & Plan Note (Addendum)
   See assessment and plan as above for asthma.  Patient is on omeprazole 20 mg twice a day for reflux with good benefit.  If coughing is persistent despite a trial of inhalers, recommend obtaining a chest x-ray next given his history of smoking.

## 2019-04-04 NOTE — Assessment & Plan Note (Signed)
Past history - 2020 intradermal skin testing was positive to grass pollen, tree pollen, ragweed pollen, mold, cat and dog. Interim history - some improvement in symptoms after adding azelastine nasal spray.  Continue environmental control measures.   Had a detailed discussion with patient/family that clinical history is suggestive of allergic rhinitis, and may benefit from allergy immunotherapy (AIT). Discussed in detail regarding the dosing, schedule, side effects (mild to moderate local allergic reaction and rarely systemic allergic reactions including anaphylaxis), and benefits (significant improvement in nasal symptoms, seasonal flares of asthma) of immunotherapy with the patient. There is significant time commitment involved with allergy shots, which includes weekly immunotherapy injections for first 9-12 months and then biweekly to monthly injections for 3-5 years.   Read about allergy injections.  May use over the counter antihistamines such as Zyrtec (cetirizine), Claritin (loratadine), Allegra (fexofenadine), or Xyzal (levocetirizine) daily as needed.  Continue Flonase (fluticasone) two sprays per nostril daily for nasal congestion.  Continue Astelin (azelastine) 2 sprays per nostril 1-2 times daily as needed for drainage.   Nasal saline spray (i.e., Simply Saline) or nasal saline lavage (i.e., NeilMed) is recommended as needed and prior to medicated nasal sprays.

## 2019-04-04 NOTE — Assessment & Plan Note (Signed)
Having issues with coughing and wheezing for many years and noticed some increased phlegm production at times as well.  He did not get the inhaler from the last visit.  Used to be a heavy smoker and quit in 1995.  History of using some type of daily inhaler at the [place] and not sure if it was effective.  Today's spirometry was normal and had some improvement in FEV1 post bronchodilator treatment.  Patient also noted that he was not wheezing after the treatment. Daily controller medication(s): start Flovent 110 2 puffs twice a day with spacer and rinse mouth afterwards. Spacer given. Prior to physical activity: May use albuterol rescue inhaler 2 puffs 5 to 15 minutes prior to strenuous physical activities. Rescue medications: May use albuterol rescue inhaler 2 puffs or nebulizer every 4 to 6 hours as needed for shortness of breath, chest tightness, coughing, and wheezing. Monitor frequency of use.   If coughing does not improve, then it may be beneficial to do a trial of stopping lisinopril as sometimes that can cause a nagging cough - of course this would have to be cleared with his cardiologist first.

## 2019-04-04 NOTE — Progress Notes (Signed)
Follow Up Note  RE: Adam Peters MRN: [id number] DOB: June 25, 1950 Date of Office Visit: 04/04/2019  Referring provider: Horald Peters, * Primary care provider: Horald Pollen, MD  Chief Complaint: Asthma, Cough, and Allergic Rhinitis   History of Present Illness: I had the pleasure of seeing Adam Peters for a follow up visit at the [facility] on 04/04/2019. He is a 68 y.o. male, who is being followed for asthma, allergic rhinitis. Today he is here for regular follow up visit. His previous allergy office visit was on 02/21/2019 with Dr. Ernst Peters.   1. Mild intermittent asthma Patient has been having issues with wheezing and coughing for years.  Sometimes having phlegm as well.  He didn't get the inhaler from the last visit. He is also hearing some gurgling and wheezing today during the visit.   Patient used to be heavy smoker, quit in 1995.  He used to be on some type of daily inhaler at the [place]. He is not sure what it was or if it helped.   He is also been on lisinopril for many years. He is not sure if the coughing started before or after taking this medication.  He is on Prilosec 20mg  twice a day for his reflux which is well controlled.   2. Seasonal and perennial allergic rhinitis Currently on allegra 180mg  daily, Flonase 2 sprays daily and azelastine 2 sprays twice a day with some benefit. He noticed improvement in his nasal congestion with the addition of azelastine. He is not sure about he allergy injections yet and would like to think about it more.   Assessment and Plan: Adam Peters is a 68 y.o. male with: Not well controlled mild persistent asthma Having issues with coughing and wheezing for many years and noticed some increased phlegm production at times as well.  He did not get the inhaler from the last visit.  Used to be a heavy smoker and quit in 1995.  History of using some type of daily inhaler at the [place] and not sure if it  was effective.  Today's spirometry was normal and had some improvement in FEV1 post bronchodilator treatment.  Patient also noted that he was not wheezing after the treatment. Daily controller medication(s): start Flovent 110 2 puffs twice a day with spacer and rinse mouth afterwards. Spacer given. Prior to physical activity: May use albuterol rescue inhaler 2 puffs 5 to 15 minutes prior to strenuous physical activities. Rescue medications: May use albuterol rescue inhaler 2 puffs or nebulizer every 4 to 6 hours as needed for shortness of breath, chest tightness, coughing, and wheezing. Monitor frequency of use.   If coughing does not improve, then it may be beneficial to do a trial of stopping lisinopril as sometimes that can cause a nagging cough - of course this would have to be cleared with his cardiologist first.   Coughing  See assessment and plan as above for asthma.  Patient is on omeprazole 20 mg twice a day for reflux with good benefit.  If coughing is persistent despite a trial of inhalers, recommend obtaining a chest x-ray next given his history of smoking.   Seasonal and perennial allergic rhinitis Past history - 2020 intradermal skin testing was positive to grass Peters, tree Peters, ragweed Peters, mold, cat and dog. Interim history - some improvement in symptoms after adding azelastine nasal spray.  Continue environmental control measures.   Had a detailed discussion with patient/family that clinical history is  suggestive of allergic rhinitis, and may benefit from allergy immunotherapy (AIT). Discussed in detail regarding the dosing, schedule, side effects (mild to moderate local allergic reaction and rarely systemic allergic reactions including anaphylaxis), and benefits (significant improvement in nasal symptoms, seasonal flares of asthma) of immunotherapy with the patient. There is significant time commitment involved with allergy shots, which includes weekly immunotherapy  injections for first 9-12 months and then biweekly to monthly injections for 3-5 years.   Read about allergy injections.  May use over the counter antihistamines such as Zyrtec (cetirizine), Claritin (loratadine), Allegra (fexofenadine), or Xyzal (levocetirizine) daily as needed.  Continue Flonase (fluticasone) two sprays per nostril daily for nasal congestion.  Continue Astelin (azelastine) 2 sprays per nostril 1-2 times daily as needed for drainage.   Nasal saline spray (i.e., Simply Saline) or nasal saline lavage (i.e., NeilMed) is recommended as needed and prior to medicated nasal sprays.  Return in about 6 weeks (around 05/16/2019).  Meds ordered this encounter  Medications   DISCONTD: albuterol (VENTOLIN HFA) 108 (90 Base) MCG/ACT inhaler    Sig: Inhale 2 puffs into the lungs every 6 (six) hours as needed for wheezing or shortness of breath.    Dispense:  18 g    Refill:  1   fluticasone (FLOVENT HFA) 110 MCG/ACT inhaler    Sig: Inhale 2 puffs into the lungs 2 (two) times daily. With spacer and rinse mouth afterewards    Dispense:  3 Inhaler    Refill:  1   albuterol (VENTOLIN HFA) 108 (90 Base) MCG/ACT inhaler    Sig: Inhale 2 puffs into the lungs every 6 (six) hours as needed for wheezing or shortness of breath.    Dispense:  54 g    Refill:  0   Diagnostics: Spirometry:  Tracings reviewed. His effort: Good reproducible efforts. FVC: 3.97L FEV1: 2.75L, 81% predicted FEV1/FVC ratio: 69% Interpretation: Spirometry consistent with normal pattern and had some improvement in FEV1 post bronchodilator treatment.  Patient also clinically felt better. Please see scanned spirometry results for details.  Medication List:  Current Outpatient Medications  Medication Sig Dispense Refill   alprostadil (EDEX) 20 MCG injection 20 mcg by Intracavitary route as needed for erectile dysfunction. use no more than 3 times per week     amLODipine (NORVASC) 2.5 MG tablet TAKE 1 TABLET  DAILY 90 tablet 3   aspirin EC 81 MG tablet Take 81 mg by mouth daily.     atorvastatin (LIPITOR) 40 MG tablet TAKE 1 TABLET DAILY 90 tablet 2   azelastine (ASTELIN) 0.1 % nasal spray Place 2 sprays into both nostrils 2 (two) times daily as needed for rhinitis. Use in each nostril as directed 90 mL 1   calcipotriene (DOVONOX) 0.005 % cream Apply topically 2 (two) times daily.     dorzolamide-timolol (COSOPT) 22.3-6.8 MG/ML ophthalmic solution Place 1 drop into both eyes 2 (two) times daily.     DULOXETINE HCL PO Take by mouth daily.     fexofenadine-pseudoephedrine (ALLEGRA-D 24) 180-240 MG 24 hr tablet Take 1 tablet by mouth daily.     fluticasone (FLONASE) 50 MCG/ACT nasal spray Place 2 sprays into both nostrils daily. 48 g 1   lisinopril (ZESTRIL) 40 MG tablet TAKE 1 TABLET DAILY 90 tablet 3   nitroGLYCERIN (NITROSTAT) 0.4 MG SL tablet DISSOLVE 1 TABLET UNDER THE TONGUE EVERY 5 MINUTES AS NEEDED FOR CHEST PAIN. 1 tablet 11   olmesartan (BENICAR) 20 MG tablet TAKE 1 TABLET DAILY 90 tablet 1  omeprazole (PRILOSEC) 20 MG capsule TAKE 2 CAPSULES TWICE A DAY BEFORE MEALS 180 capsule 8   sildenafil (VIAGRA) 100 MG tablet TAKE 1 TABLET DAILY AS NEEDED FOR ERECTILE DYSFUNCTION (NOT TO USE MEDICATION WITH NITROGLYCERIN) 60 tablet 4   albuterol (VENTOLIN HFA) 108 (90 Base) MCG/ACT inhaler Inhale 2 puffs into the lungs every 6 (six) hours as needed for wheezing or shortness of breath. 54 g 0   fluticasone (FLOVENT HFA) 110 MCG/ACT inhaler Inhale 2 puffs into the lungs 2 (two) times daily. With spacer and rinse mouth afterewards 3 Inhaler 1   No current facility-administered medications for this visit.    Allergies: Allergies  Allergen Reactions   Amoxicillin Diarrhea    GI upset.   I reviewed his past medical history, social history, family history, and environmental history and no significant changes have been reported from his previous visit.  Review of Systems  Constitutional:  Negative for appetite change, chills, fever and unexpected weight change.  HENT: Positive for congestion. Negative for rhinorrhea.   Eyes: Negative for itching.  Respiratory: Positive for cough and wheezing. Negative for chest tightness and shortness of breath.   Gastrointestinal: Negative for abdominal pain.  Skin: Negative for rash.  Allergic/Immunologic: Positive for environmental allergies.  Neurological: Negative for headaches.   Objective: BP (!) 180/70    Pulse 70    Temp 97.6 F (36.4 C) (Temporal)    Resp 18    Ht 6\' 2"  (1.88 m)    SpO2 98%    BMI 27.27 kg/m  Body mass index is 27.27 kg/m. Physical Exam  Constitutional: He is oriented to person, place, and time. He appears well-developed and well-nourished.  HENT:  Head: Normocephalic and atraumatic.  Right Ear: External ear normal.  Left Ear: External ear normal.  Nose: Nose normal.  Mouth/Throat: Oropharynx is clear and moist.  Eyes: Conjunctivae and EOM are normal.  Neck: Neck supple.  Cardiovascular: Normal rate, regular rhythm and normal heart sounds. Exam reveals no gallop and no friction rub.  No murmur heard. Pulmonary/Chest: Effort normal. He has no wheezes. He has rales (cleared up after bronchodilator treatment).  Neurological: He is alert and oriented to person, place, and time.  Skin: Skin is warm. No rash noted.  Psychiatric: He has a normal mood and affect. His behavior is normal.  Nursing note and vitals reviewed.  Previous notes and tests were reviewed. The plan was reviewed with the patient/family, and all questions/concerned were addressed.  It was my pleasure to see Gerhardt today and participate in his care. Please feel free to contact me with any questions or concerns.  Sincerely,  Rexene Alberts, DO Allergy & Immunology  [facility] office: [phone] [facility] office: [place] office: [phone]

## 2019-04-04 NOTE — Patient Instructions (Addendum)
1. Asthma: Daily controller medication(s): start Flovent 110 2 puffs twice a day with spacer and rinse mouth afterwards. Prior to physical activity: May use albuterol rescue inhaler 2 puffs 5 to 15 minutes prior to strenuous physical activities. Rescue medications: May use albuterol rescue inhaler 2 puffs or nebulizer every 4 to 6 hours as needed for shortness of breath, chest tightness, coughing, and wheezing. Monitor frequency of use.  Asthma control goals:  Full participation in all desired activities (may need albuterol before activity) Albuterol use two times or less a week on average (not counting use with activity) Cough interfering with sleep two times or less a month Oral steroids no more than once a year No hospitalizations  If coughing does not improve, then ask your cardiologist if they can change your lisinopril as sometimes that can cause a nagging cough.   2. Seasonal and perennial allergic rhinitis 2020 testing was positive to grasses, ragweed, trees, indoor molds, outdoor molds, cat and dog.  Continue environmental control measures.   Had a detailed discussion with patient/family that clinical history is suggestive of allergic rhinitis, and may benefit from allergy immunotherapy (AIT). Discussed in detail regarding the dosing, schedule, side effects (mild to moderate local allergic reaction and rarely systemic allergic reactions including anaphylaxis), and benefits (significant improvement in nasal symptoms, seasonal flares of asthma) of immunotherapy with the patient. There is significant time commitment involved with allergy shots, which includes weekly immunotherapy injections for first 9-12 months and then biweekly to monthly injections for 3-5 years.   Read about allergy injections.  May use over the counter antihistamines such as Zyrtec (cetirizine), Claritin (loratadine), Allegra (fexofenadine), or Xyzal (levocetirizine) daily as needed.  Continue Flonase  (fluticasone) two sprays per nostril daily for nasal congestion.  Continue Astelin (azelastine) 2 sprays per nostril 1-2 times daily as needed for drainage.   Nasal saline spray (i.e., Simply Saline) or nasal saline lavage (i.e., NeilMed) is recommended as needed and prior to medicated nasal sprays.  Follow up with Dr. Ernst Bowler in 6 weeks or sooner if needed to check on the coughing and allergies.   Reducing Pollen Exposure . Pollen seasons: trees (spring), grass (summer) and ragweed/weeds (fall). Marland Kitchen Keep windows closed in your home and car to lower pollen exposure.  Susa Simmonds air conditioning in the bedroom and throughout the house if possible.  . Avoid going out in dry windy days - especially early morning. . Pollen counts are highest between 5 - 10 AM and on dry, hot and windy days.  . Save outside activities for late afternoon or after a heavy rain, when pollen levels are lower.  . Avoid mowing of grass if you have grass pollen allergy. Marland Kitchen Be aware that pollen can also be transported indoors on people and pets.  . Dry your clothes in an automatic dryer rather than hanging them outside where they might collect pollen.  . Rinse hair and eyes before bedtime. Mold Control . Mold and fungi can grow on a variety of surfaces provided certain temperature and moisture conditions exist.  . Outdoor molds grow on plants, decaying vegetation and soil. The major outdoor mold, Alternaria and Cladosporium, are found in very high numbers during hot and dry conditions. Generally, a late summer - fall peak is seen for common outdoor fungal spores. Rain will temporarily lower outdoor mold spore count, but counts rise rapidly when the rainy period ends. . The most important indoor molds are Aspergillus and Penicillium. Dark, humid and poorly ventilated basements are  ideal sites for mold growth. The next most common sites of mold growth are the bathroom and the kitchen. Outdoor (Seasonal) Mold Control . Use air  conditioning and keep windows closed. . Avoid exposure to decaying vegetation. Marland Kitchen Avoid leaf raking. . Avoid grain handling. . Consider wearing a face mask if working in moldy areas.  Indoor (Perennial) Mold Control  . Maintain humidity below 50%. . Get rid of mold growth on hard surfaces with water, detergent and, if necessary, 5% bleach (do not mix with other cleaners). Then dry the area completely. If mold covers an area more than 10 square feet, consider hiring an indoor environmental professional. . For clothing, washing with soap and water is best. If moldy items cannot be cleaned and dried, throw them away. . Remove sources e.g. contaminated carpets. . Repair and seal leaking roofs or pipes. Using dehumidifiers in damp basements may be helpful, but empty the water and clean units regularly to prevent mildew from forming. All rooms, especially basements, bathrooms and kitchens, require ventilation and cleaning to deter mold and mildew growth. Avoid carpeting on concrete or damp floors, and storing items in damp areas. Pet Allergen Avoidance: . Contrary to popular opinion, there are no "hypoallergenic" breeds of dogs or cats. That is because people are not allergic to an animal's hair, but to an allergen found in the animal's saliva, dander (dead skin flakes) or urine. Pet allergy symptoms typically occur within minutes. For some people, symptoms can build up and become most severe 8 to 12 hours after contact with the animal. People with severe allergies can experience reactions in public places if dander has been transported on the pet owners' clothing. Marland Kitchen Keeping an animal outdoors is only a partial solution, since homes with pets in the yard still have higher concentrations of animal allergens. . Before getting a pet, ask your allergist to determine if you are allergic to animals. If your pet is already considered part of your family, try to minimize contact and keep the pet out of the bedroom  and other rooms where you spend a great deal of time. . As with dust mites, vacuum carpets often or replace carpet with a hardwood floor, tile or linoleum. . High-efficiency particulate air (HEPA) cleaners can reduce allergen levels over time. . While dander and saliva are the source of cat and dog allergens, urine is the source of allergens from rabbits, hamsters, mice and [place] pigs; so ask a non-allergic family member to clean the animal's cage. . If you have a pet allergy, talk to your allergist about the potential for allergy immunotherapy (allergy shots). This strategy can often provide long-term relief.

## 2019-04-05 ENCOUNTER — Other Ambulatory Visit: Payer: Self-pay | Admitting: Internal Medicine

## 2019-04-28 ENCOUNTER — Other Ambulatory Visit: Payer: Self-pay | Admitting: Internal Medicine

## 2019-05-16 ENCOUNTER — Ambulatory Visit (INDEPENDENT_AMBULATORY_CARE_PROVIDER_SITE_OTHER): Payer: Medicare Other | Admitting: Allergy & Immunology

## 2019-05-16 ENCOUNTER — Other Ambulatory Visit: Payer: Self-pay | Admitting: Emergency Medicine

## 2019-05-16 ENCOUNTER — Encounter: Payer: Self-pay | Admitting: Allergy & Immunology

## 2019-05-16 ENCOUNTER — Other Ambulatory Visit: Payer: Self-pay

## 2019-05-16 VITALS — BP 140/70 | HR 75 | Temp 97.6°F | Resp 16 | Ht 73.5 in | Wt 219.2 lb

## 2019-05-16 DIAGNOSIS — I10 Essential (primary) hypertension: Secondary | ICD-10-CM

## 2019-05-16 DIAGNOSIS — J302 Other seasonal allergic rhinitis: Secondary | ICD-10-CM | POA: Diagnosis not present

## 2019-05-16 DIAGNOSIS — I2510 Atherosclerotic heart disease of native coronary artery without angina pectoris: Secondary | ICD-10-CM | POA: Diagnosis not present

## 2019-05-16 DIAGNOSIS — J4530 Mild persistent asthma, uncomplicated: Secondary | ICD-10-CM | POA: Diagnosis not present

## 2019-05-16 DIAGNOSIS — J3089 Other allergic rhinitis: Secondary | ICD-10-CM | POA: Diagnosis not present

## 2019-05-16 MED ORDER — EPINEPHRINE 0.3 MG/0.3ML IJ SOAJ: 0.3000 mg | Freq: Once | INTRAMUSCULAR | 2 refills | Status: AC

## 2019-05-16 NOTE — Progress Notes (Signed)
FOLLOW UP  Date of Service/Encounter:  05/16/19   Assessment:   Mild intermittent asthma, uncomplicated  Seasonal and perennial allergic rhinitis (grasses, ragweed, trees, indoor molds, outdoor molds, cat and dog) - initiating allergen immunotherapy today  Plan/Recommendations:   1. Mild persistent asthma, uncomplicated - Lung testing looked great today. - We will continue with the Flovent. - Daily controller medication(s): Flovent 162mcg 2 puffs twice daily with spacer - Prior to physical activity: albuterol 2 puffs 10-15 minutes before physical activity. - Rescue medications: albuterol 4 puffs every 4-6 hours as needed - Changes during respiratory infections or worsening symptoms: Increase Flovent 177mcg to 4 puffs twice daily for TWO WEEKS. - Asthma control goals:  * Full participation in all desired activities (may need albuterol before activity) * Albuterol use two time or less a week on average (not counting use with activity) * Cough interfering with sleep two time or less a month * Oral steroids no more than once a year * No hospitalizations  2. Seasonal and perennial allergic rhinitis (grasses, ragweed, trees, indoor molds, outdoor molds, cat and dog) - Consider starting allergy shots. - Consent signed today.  - Call [employer] to check on any copayments. - Call us when you make a decision.  - Continue with: Allegra (fexofenadine) 180mg  table once daily and Flonase (fluticasone) two sprays per nostril daily and Astelin (azelastine) 2 sprays per nostril 1-2 times daily as needed - Call us when you make a decision.   3. Return in about 3 months (around 08/14/2019). This can be an in-person, a virtual Webex or a telephone follow up visit.  Subjective:   Adam Peters is a 68 y.o. male presenting today for follow up of  Chief Complaint  Patient presents with  . Asthma    coughing, wheezing, having to clear his throat every now and then    Adam Peters  has a history of the following: Patient Active Problem List   Diagnosis Date Noted  . Seasonal and perennial allergic rhinitis 04/04/2019  . Ex-smoker 04/04/2019  . Not well controlled mild persistent asthma 04/04/2019  . Thrombocytopenia ([place]) 01/12/2018  . Coughing 11/23/2016  . Essential hypertension   . Hyperlipidemia   . History of tobacco use 07/08/2012  . Glaucoma 04/08/2012  . DDD (degenerative disc disease), cervical   . CAD- LAD BMS '97, RCA DES 08/02/13 08/17/2011    History obtained from: chart review and patient.  Adam Peters is a 68 y.o. male presenting for a follow up visit.  I last saw him in October 2020 as a new patient.  At that time, he had environmental allergy testing that was positive to grasses, ragweed, trees, indoor and outdoor molds, cat, and dog.  We continued Allegra and Flonase and started Astelin 2 sprays per nostril up to twice daily.  We did not address his asthma since he seemed well controlled with albuterol as needed.    In the interim, he came back for a sick visit in November 2020.  He saw Dr. Maudie Mercury, who started him on Flovent 110 mcg 2 puffs twice daily.  He did have a spirometry that had some improvement in the FEV1 postbronchodilator.  Asthma/Respiratory Symptom History: He remains on the Flovent 2 puffs twice daily.  He does feel that this is providing relief of his wheezing.  He has not been using his rescue inhaler much at all.  He has not required any prednisone and did not get prednisone at the last visit with  Dr. Maudie Mercury.  ACT score is 14, indicating subpar asthma control.  He does report congestion that is worse in the morning after waking up.  This eventually resolves over the course of the day.   Allergic Rhinitis Symptom History: He remains on his nose sprays as well as his antihistamine.  He would like to get off of some of these medications and wants to discuss allergen immunotherapy today.  He is open to doing this.  He lives in [place] and  realizes that he would need weekly visits first quite some time.  He is completely fine with this.  He does not have an EpiPen.  Otherwise, there have been no changes to his past medical history, surgical history, family history, or social history.    Review of Systems  Constitutional: Negative.  Negative for chills, fever, malaise/fatigue and weight loss.  HENT: Positive for congestion. Negative for ear discharge and ear pain.        Positive for postnasal drip.  Eyes: Negative for pain, discharge and redness.  Respiratory: Positive for cough. Negative for sputum production, shortness of breath and wheezing.   Cardiovascular: Negative.  Negative for chest pain and palpitations.  Gastrointestinal: Negative for abdominal pain, constipation, diarrhea, heartburn, nausea and vomiting.  Skin: Negative.  Negative for itching and rash.  Neurological: Negative for dizziness and headaches.  Endo/Heme/Allergies: Negative for environmental allergies. Does not bruise/bleed easily.       Objective:   Blood pressure 140/70, pulse 75, temperature 97.6 F (36.4 C), temperature source Temporal, resp. rate 16, height 6' 1.5" (1.867 m), weight 219 lb 3.2 oz (99.4 kg), SpO2 99 %. Body mass index is 28.53 kg/m.   Physical Exam:  Physical Exam  Constitutional: He appears well-developed.  Pleasant male.  Very talkative.  HENT:  Head: Normocephalic and atraumatic.  Right Ear: Tympanic membrane, external ear and ear canal normal.  Left Ear: Tympanic membrane, external ear and ear canal normal.  Nose: Mucosal edema and rhinorrhea present. No nasal deformity or septal deviation. No epistaxis. Right sinus exhibits no maxillary sinus tenderness and no frontal sinus tenderness. Left sinus exhibits no maxillary sinus tenderness and no frontal sinus tenderness.  Mouth/Throat: Uvula is midline and oropharynx is clear and moist. Mucous membranes are not pale and not dry.  Tonsils 2+ bilaterally without  discharge.  No exudates.  Eyes: Pupils are equal, round, and reactive to light. Conjunctivae and EOM are normal. Right eye exhibits no chemosis and no discharge. Left eye exhibits no chemosis and no discharge. Right conjunctiva is not injected. Left conjunctiva is not injected.  Cardiovascular: Normal rate, regular rhythm and normal heart sounds.  Respiratory: Effort normal and breath sounds normal. No accessory muscle usage. No tachypnea. No respiratory distress. He has no wheezes. He has no rhonchi. He has no rales. He exhibits no tenderness.  Moving air well in all lung fields.  No increased work of breathing.  Lymphadenopathy:    He has no cervical adenopathy.  Neurological: He is alert.  Skin: No abrasion, no petechiae and no rash noted. Rash is not papular, not vesicular and not urticarial. No erythema. No pallor.  Psychiatric: He has a normal mood and affect.     Diagnostic studies:    Spirometry: results normal (FEV1: 2.78/82%, FVC: 4.09/91%, FEV1/FVC: 68%).    Spirometry consistent with normal pattern.   Allergy Studies: none        Adam Marvel, MD  [facility]

## 2019-05-16 NOTE — Patient Instructions (Addendum)
1. Mild persistent asthma, uncomplicated - Lung testing looked great today. - We will continue with the Flovent. - Daily controller medication(s): Flovent 163mcg 2 puffs twice daily with spacer - Prior to physical activity: albuterol 2 puffs 10-15 minutes before physical activity. - Rescue medications: albuterol 4 puffs every 4-6 hours as needed - Changes during respiratory infections or worsening symptoms: Increase Flovent 165mcg to 4 puffs twice daily for TWO WEEKS. - Asthma control goals:  * Full participation in all desired activities (may need albuterol before activity) * Albuterol use two time or less a week on average (not counting use with activity) * Cough interfering with sleep two time or less a month * Oral steroids no more than once a year * No hospitalizations  2. Seasonal and perennial allergic rhinitis (grasses, ragweed, trees, indoor molds, outdoor molds, cat and dog) - Consider starting allergy shots. - Consent signed today.  - Call [employer] to check on any copayments. - Call us when you make a decision.  - Continue with: Allegra (fexofenadine) 180mg  table once daily and Flonase (fluticasone) two sprays per nostril daily and Astelin (azelastine) 2 sprays per nostril 1-2 times daily as needed - Call us when you make a decision.   3. Return in about 3 months (around 08/14/2019). This can be an in-person, a virtual Webex or a telephone follow up visit.   Please inform us of any Emergency Department visits, hospitalizations, or changes in symptoms. Call us before going to the ED for breathing or allergy symptoms since we might be able to fit you in for a sick visit. Feel free to contact us anytime with any questions, problems, or concerns.  It was a pleasure to see you again today!  Websites that have reliable patient information: 1. American Academy of Asthma, Allergy, and Immunology: www.aaaai.org 2. Food Allergy Research and Education (FARE): foodallergy.org 3. Mothers  of Asthmatics: http://www.asthmacommunitynetwork.org 4. American College of Allergy, Asthma, and Immunology: www.acaai.org  "Like" [place] on Facebook and Instagram for our latest updates!        Make sure you are registered to vote! If you have moved or changed any of your contact information, you will need to get this updated before voting!  In some cases, you MAY be able to register to vote online: [email or web address]

## 2019-05-22 NOTE — Progress Notes (Signed)
VIALS EXP 05-21-20

## 2019-05-23 DIAGNOSIS — J3081 Allergic rhinitis due to animal (cat) (dog) hair and dander: Secondary | ICD-10-CM | POA: Diagnosis not present

## 2019-05-24 DIAGNOSIS — J3081 Allergic rhinitis due to animal (cat) (dog) hair and dander: Secondary | ICD-10-CM | POA: Diagnosis not present

## 2019-06-07 ENCOUNTER — Ambulatory Visit (INDEPENDENT_AMBULATORY_CARE_PROVIDER_SITE_OTHER): Payer: Medicare Other | Admitting: Emergency Medicine

## 2019-06-07 ENCOUNTER — Encounter: Payer: Self-pay | Admitting: Emergency Medicine

## 2019-06-07 ENCOUNTER — Other Ambulatory Visit: Payer: Self-pay

## 2019-06-07 VITALS — BP 122/74 | HR 78 | Temp 97.8°F | Wt 217.4 lb

## 2019-06-07 DIAGNOSIS — I10 Essential (primary) hypertension: Secondary | ICD-10-CM | POA: Diagnosis not present

## 2019-06-07 DIAGNOSIS — E785 Hyperlipidemia, unspecified: Secondary | ICD-10-CM | POA: Diagnosis not present

## 2019-06-07 DIAGNOSIS — I2510 Atherosclerotic heart disease of native coronary artery without angina pectoris: Secondary | ICD-10-CM | POA: Diagnosis not present

## 2019-06-07 DIAGNOSIS — Z8679 Personal history of other diseases of the circulatory system: Secondary | ICD-10-CM | POA: Diagnosis not present
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
## 2019-06-07 NOTE — Patient Instructions (Addendum)
If you have lab work done today you will be contacted with your lab results within the next 2 weeks.  If you have not heard from [place] then please contact us. The fastest way to get your results is to register for My Chart.   IF you received an x-ray today, you will receive an invoice from [facility]. Please contact [facility] at [phone] with questions or concerns regarding your invoice.   IF you received labwork today, you will receive an invoice from [place]. Please contact [facility] at [phone] with questions or concerns regarding your invoice.   Our billing staff will not be able to assist you with questions regarding bills from these companies.  You will be contacted with the lab results as soon as they are available. The fastest way to get your results is to activate your My Chart account. Instructions are located on the last page of this paperwork. If you have not heard from [place] regarding the results in 2 weeks, please contact this office.      Health Maintenance After Age 36 After age 63, you are at a higher risk for certain long-term diseases and infections as well as injuries from falls. Falls are a major cause of broken bones and head injuries in people who are older than age 25. Getting regular preventive care can help to keep you healthy and well. Preventive care includes getting regular testing and making lifestyle changes as recommended by your health care provider. Talk with your health care provider about:  Which screenings and tests you should have. A screening is a test that checks for a disease when you have no symptoms.  A diet and exercise plan that is right for you. What should I know about screenings and tests to prevent falls? Screening and testing are the best ways to find a health problem early. Early diagnosis and treatment give you the best chance of managing medical conditions that are common after age 57. Certain conditions and  lifestyle choices may make you more likely to have a fall. Your health care provider may recommend:  Regular vision checks. Poor vision and conditions such as cataracts can make you more likely to have a fall. If you wear glasses, make sure to get your prescription updated if your vision changes.  Medicine review. Work with your health care provider to regularly review all of the medicines you are taking, including over-the-counter medicines. Ask your health care provider about any side effects that may make you more likely to have a fall. Tell your health care provider if any medicines that you take make you feel dizzy or sleepy.  Osteoporosis screening. Osteoporosis is a condition that causes the bones to get weaker. This can make the bones weak and cause them to break more easily.  Blood pressure screening. Blood pressure changes and medicines to control blood pressure can make you feel dizzy.  Strength and balance checks. Your health care provider may recommend certain tests to check your strength and balance while standing, walking, or changing positions.  Foot health exam. Foot pain and numbness, as well as not wearing proper footwear, can make you more likely to have a fall.  Depression screening. You may be more likely to have a fall if you have a fear of falling, feel emotionally low, or feel unable to do activities that you used to do.  Alcohol use screening. Using too much alcohol can affect your balance and may make you more likely  have a fall. What actions can I take to lower my risk of falls? General instructions  Talk with your health care provider about your risks for falling. Tell your health care provider if: ? You fall. Be sure to tell your health care provider about all falls, even ones that seem minor. ? You feel dizzy, sleepy, or off-balance.  Take over-the-counter and prescription medicines only as told by your health care provider. These include any  supplements.  Eat a healthy diet and maintain a healthy weight. A healthy diet includes low-fat dairy products, low-fat (lean) meats, and fiber from whole grains, beans, and lots of fruits and vegetables. Home safety  Remove any tripping hazards, such as rugs, cords, and clutter.  Install safety equipment such as grab bars in bathrooms and safety rails on stairs.  Keep rooms and walkways well-lit. Activity   Follow a regular exercise program to stay fit. This will help you maintain your balance. Ask your health care provider what types of exercise are appropriate for you.  If you need a cane or walker, use it as recommended by your health care provider.  Wear supportive shoes that have nonskid soles. Lifestyle  Do not drink alcohol if your health care provider tells you not to drink.  If you drink alcohol, limit how much you have: ? 0-1 drink a day for women. ? 0-2 drinks a day for men.  Be aware of how much alcohol is in your drink. In the [place] one drink equals one typical bottle of beer (12 oz), one-half glass of wine (5 oz), or one shot of hard liquor (1 oz).  Do not use any products that contain nicotine or tobacco, such as cigarettes and e-cigarettes. If you need help quitting, ask your health care provider. Summary  Having a healthy lifestyle and getting preventive care can help to protect your health and wellness after age 65.  Screening and testing are the best way to find a health problem early and help you avoid having a fall. Early diagnosis and treatment give you the best chance for managing medical conditions that are more common for people who are older than age 65.  Falls are a major cause of broken bones and head injuries in people who are older than age 65. Take precautions to prevent a fall at home.  Work with your health care provider to learn what changes you can make to improve your health and wellness and to prevent falls. This information is not intended  to replace advice given to you by your health care provider. Make sure you discuss any questions you have with your health care provider. Document Revised: 08/25/2018 Document Reviewed: 03/17/2017 Elsevier Patient Education  2020 Elsevier Inc.  Hypertension, Adult High blood pressure (hypertension) is when the force of blood pumping through the arteries is too strong. The arteries are the blood vessels that carry blood from the heart throughout the body. Hypertension forces the heart to work harder to pump blood and may cause arteries to become narrow or stiff. Untreated or uncontrolled hypertension can cause a heart attack, heart failure, a stroke, kidney disease, and other problems. A blood pressure reading consists of a higher number over a lower number. Ideally, your blood pressure should be below 120/80. The first ("top") number is called the systolic pressure. It is a measure of the pressure in your arteries as your heart beats. The second ("bottom") number is called the diastolic pressure. It is a measure of the pressure in   your arteries as the heart relaxes. What are the causes? The exact cause of this condition is not known. There are some conditions that result in or are related to high blood pressure. What increases the risk? Some risk factors for high blood pressure are under your control. The following factors may make you more likely to develop this condition:  Smoking.  Having type 2 diabetes mellitus, high cholesterol, or both.  Not getting enough exercise or physical activity.  Being overweight.  Having too much fat, sugar, calories, or salt (sodium) in your diet.  Drinking too much alcohol. Some risk factors for high blood pressure may be difficult or impossible to change. Some of these factors include:  Having chronic kidney disease.  Having a family history of high blood pressure.  Age. Risk increases with age.  Race. You may be at higher risk if you are African  American.  Gender. Men are at higher risk than women before age 45. After age 65, women are at higher risk than men.  Having obstructive sleep apnea.  Stress. What are the signs or symptoms? High blood pressure may not cause symptoms. Very high blood pressure (hypertensive crisis) may cause:  Headache.  Anxiety.  Shortness of breath.  Nosebleed.  Nausea and vomiting.  Vision changes.  Severe chest pain.  Seizures. How is this diagnosed? This condition is diagnosed by measuring your blood pressure while you are seated, with your arm resting on a flat surface, your legs uncrossed, and your feet flat on the floor. The cuff of the blood pressure monitor will be placed directly against the skin of your upper arm at the level of your heart. It should be measured at least twice using the same arm. Certain conditions can cause a difference in blood pressure between your right and left arms. Certain factors can cause blood pressure readings to be lower or higher than normal for a short period of time:  When your blood pressure is higher when you are in a health care provider's office than when you are at home, this is called white coat hypertension. Most people with this condition do not need medicines.  When your blood pressure is higher at home than when you are in a health care provider's office, this is called masked hypertension. Most people with this condition may need medicines to control blood pressure. If you have a high blood pressure reading during one visit or you have normal blood pressure with other risk factors, you may be asked to:  Return on a different day to have your blood pressure checked again.  Monitor your blood pressure at home for 1 week or longer. If you are diagnosed with hypertension, you may have other blood or imaging tests to help your health care provider understand your overall risk for other conditions. How is this treated? This condition is treated by  making healthy lifestyle changes, such as eating healthy foods, exercising more, and reducing your alcohol intake. Your health care provider may prescribe medicine if lifestyle changes are not enough to get your blood pressure under control, and if:  Your systolic blood pressure is above 130.  Your diastolic blood pressure is above 80. Your personal target blood pressure may vary depending on your medical conditions, your age, and other factors. Follow these instructions at home: Eating and drinking   Eat a diet that is high in fiber and potassium, and low in sodium, added sugar, and fat. An example eating plan is called the DASH (  Dietary Approaches to Stop Hypertension) diet. To eat this way: ? Eat plenty of fresh fruits and vegetables. Try to fill one half of your plate at each meal with fruits and vegetables. ? Eat whole grains, such as whole-wheat pasta, brown rice, or whole-grain bread. Fill about one fourth of your plate with whole grains. ? Eat or drink low-fat dairy products, such as skim milk or low-fat yogurt. ? Avoid fatty cuts of meat, processed or cured meats, and poultry with skin. Fill about one fourth of your plate with lean proteins, such as fish, chicken without skin, beans, eggs, or tofu. ? Avoid pre-made and processed foods. These tend to be higher in sodium, added sugar, and fat.  Reduce your daily sodium intake. Most people with hypertension should eat less than 1,500 mg of sodium a day.  Do not drink alcohol if: ? Your health care provider tells you not to drink. ? You are pregnant, may be pregnant, or are planning to become pregnant.  If you drink alcohol: ? Limit how much you use to:  0-1 drink a day for women.  0-2 drinks a day for men. ? Be aware of how much alcohol is in your drink. In the [place] one drink equals one 12 oz bottle of beer (355 mL), one 5 oz glass of wine (148 mL), or one 1 oz glass of hard liquor (44 mL). Lifestyle   Work with your health  care provider to maintain a healthy body weight or to lose weight. Ask what an ideal weight is for you.  Get at least 30 minutes of exercise most days of the week. Activities may include walking, swimming, or biking.  Include exercise to strengthen your muscles (resistance exercise), such as Pilates or lifting weights, as part of your weekly exercise routine. Try to do these types of exercises for 30 minutes at least 3 days a week.  Do not use any products that contain nicotine or tobacco, such as cigarettes, e-cigarettes, and chewing tobacco. If you need help quitting, ask your health care provider.  Monitor your blood pressure at home as told by your health care provider.  Keep all follow-up visits as told by your health care provider. This is important. Medicines  Take over-the-counter and prescription medicines only as told by your health care provider. Follow directions carefully. Blood pressure medicines must be taken as prescribed.  Do not skip doses of blood pressure medicine. Doing this puts you at risk for problems and can make the medicine less effective.  Ask your health care provider about side effects or reactions to medicines that you should watch for. Contact a health care provider if you:  Think you are having a reaction to a medicine you are taking.  Have headaches that keep coming back (recurring).  Feel dizzy.  Have swelling in your ankles.  Have trouble with your vision. Get help right away if you:  Develop a severe headache or confusion.  Have unusual weakness or numbness.  Feel faint.  Have severe pain in your chest or abdomen.  Vomit repeatedly.  Have trouble breathing. Summary  Hypertension is when the force of blood pumping through your arteries is too strong. If this condition is not controlled, it may put you at risk for serious complications.  Your personal target blood pressure may vary depending on your medical conditions, your age, and  other factors. For most people, a normal blood pressure is less than 120/80.  Hypertension is treated with lifestyle changes, medicines,   or a combination of both. Lifestyle changes include losing weight, eating a healthy, low-sodium diet, exercising more, and limiting alcohol. This information is not intended to replace advice given to you by your health care provider. Make sure you discuss any questions you have with your health care provider. Document Revised: 01/12/2018 Document Reviewed: 01/12/2018 Elsevier Patient Education  2020 Elsevier Inc.  

## 2019-06-07 NOTE — Assessment & Plan Note (Addendum)
Well-controlled blood pressure.  We will continue CCB and ARB.  Stop ACE inhibitor.  Advised to monitor blood pressure readings at home.  If blood pressure at home high after stopping ACE inhibitor will increase amlodipine to 5 mg a day.  Follow-up in 6 months.

## 2019-06-08 ENCOUNTER — Ambulatory Visit: Payer: Medicare Other

## 2019-07-05 ENCOUNTER — Other Ambulatory Visit: Payer: Self-pay | Admitting: Internal Medicine

## 2019-07-12 ENCOUNTER — Other Ambulatory Visit: Payer: Self-pay

## 2019-07-12 ENCOUNTER — Ambulatory Visit (INDEPENDENT_AMBULATORY_CARE_PROVIDER_SITE_OTHER): Payer: Medicare Other | Admitting: *Deleted

## 2019-07-12 DIAGNOSIS — J309 Allergic rhinitis, unspecified: Secondary | ICD-10-CM

## 2019-07-12 NOTE — Progress Notes (Signed)
Immunotherapy   Patient Details  Name: Adam Peters MRN: [id number] Date of Birth: 05-23-1950  07/12/2019  [name] started injections for  RW-MOLDS, G-T-C-D Following schedule: B  Frequency:2 times per week Epi-Pen:Epi-Pen Available  Consent signed and patient instructions given. Patient received .80mL of RW-MOLDSin the RUA and .3mL of G-T-C-D in the LUA. Patient waited 30 minutes and did not experience any issues    Chip Boer 07/12/2019, 2:28 PM

## 2019-07-26 ENCOUNTER — Ambulatory Visit (INDEPENDENT_AMBULATORY_CARE_PROVIDER_SITE_OTHER): Payer: Medicare Other | Admitting: *Deleted

## 2019-07-26 DIAGNOSIS — J309 Allergic rhinitis, unspecified: Secondary | ICD-10-CM

## 2019-08-04 ENCOUNTER — Other Ambulatory Visit: Payer: Self-pay | Admitting: Allergy & Immunology

## 2019-08-08 ENCOUNTER — Other Ambulatory Visit: Payer: Self-pay | Admitting: Allergy & Immunology

## 2019-08-10 ENCOUNTER — Ambulatory Visit (INDEPENDENT_AMBULATORY_CARE_PROVIDER_SITE_OTHER): Payer: Medicare Other

## 2019-08-10 DIAGNOSIS — J309 Allergic rhinitis, unspecified: Secondary | ICD-10-CM | POA: Diagnosis not present

## 2019-08-15 ENCOUNTER — Other Ambulatory Visit: Payer: Self-pay

## 2019-08-15 ENCOUNTER — Encounter: Payer: Self-pay | Admitting: Allergy & Immunology

## 2019-08-15 ENCOUNTER — Ambulatory Visit (INDEPENDENT_AMBULATORY_CARE_PROVIDER_SITE_OTHER): Payer: Medicare Other | Admitting: Allergy & Immunology

## 2019-08-15 VITALS — BP 124/70 | HR 66 | Temp 97.5°F | Resp 16 | Ht 74.0 in | Wt 218.8 lb

## 2019-08-15 DIAGNOSIS — I2510 Atherosclerotic heart disease of native coronary artery without angina pectoris: Secondary | ICD-10-CM | POA: Diagnosis not present

## 2019-08-15 DIAGNOSIS — J3089 Other allergic rhinitis: Secondary | ICD-10-CM

## 2019-08-15 DIAGNOSIS — J4530 Mild persistent asthma, uncomplicated: Secondary | ICD-10-CM | POA: Diagnosis not present

## 2019-08-15 DIAGNOSIS — J302 Other seasonal allergic rhinitis: Secondary | ICD-10-CM

## 2019-08-15 NOTE — Patient Instructions (Addendum)
1. Mild persistent asthma, uncomplicated - Lung testing looked great today. - We will continue with the Flovent. - Daily controller medication(s): Flovent 130mcg 2 puffs twice daily with spacer - Prior to physical activity: albuterol 2 puffs 10-15 minutes before physical activity. - Rescue medications: albuterol 4 puffs every 4-6 hours as needed - Changes during respiratory infections or worsening symptoms: Increase Flovent 168mcg to 4 puffs twice daily for TWO WEEKS. - Asthma control goals:  * Full participation in all desired activities (may need albuterol before activity) * Albuterol use two time or less a week on average (not counting use with activity) * Cough interfering with sleep two time or less a month * Oral steroids no more than once a year * No hospitalizations  2. Seasonal and perennial allergic rhinitis (grasses, ragweed, trees, indoor molds, outdoor molds, cat and dog) - Continue allergy shots at the same schedule. - You will not get much relief until you get into your Red Vial (most concentrated).  - Continue with: Allegra (fexofenadine) 180mg  table once daily and Flonase (fluticasone) two sprays per nostril daily and Astelin (azelastine) 2 sprays per nostril 1-2 times daily as needed - Try adding on salt water rinses before the nose sprays.  3. Return in about 6 months (around 02/15/2020). This can be an in-person, a virtual Webex or a telephone follow up visit.   Please inform us of any Emergency Department visits, hospitalizations, or changes in symptoms. Call us before going to the ED for breathing or allergy symptoms since we might be able to fit you in for a sick visit. Feel free to contact us anytime with any questions, problems, or concerns.  It was a pleasure to see you again today!  Websites that have reliable patient information: 1. American Academy of Asthma, Allergy, and Immunology: www.aaaai.org 2. Food Allergy Research and Education (FARE):  foodallergy.org 3. Mothers of Asthmatics: http://www.asthmacommunitynetwork.org 4. American College of Allergy, Asthma, and Immunology: www.acaai.org   COVID-19 Vaccine Information can be found at: ShippingScam.co.uk For questions related to vaccine distribution or appointments, please email [email or web address] or call [phone].     "Like" [place] on Facebook and Instagram for our latest updates!       HAPPY SPRING!  Make sure you are registered to vote! If you have moved or changed any of your contact information, you will need to get this updated before voting!  In some cases, you MAY be able to register to vote online: [email or web address]

## 2019-08-15 NOTE — Progress Notes (Signed)
FOLLOW UP  Date of Service/Encounter:  08/15/19   Assessment:   Mild intermittent asthma, uncomplicated  Seasonal and perennial allergic rhinitis(grasses, ragweed, trees, indoor molds, outdoor molds, cat and dog) - on allergen immunotherapy today   Plan/Recommendations:   1. Mild persistent asthma, uncomplicated - Lung testing looked great today. - We will continue with the Flovent. - Daily controller medication(s): Flovent 138mcg 2 puffs twice daily with spacer - Prior to physical activity: albuterol 2 puffs 10-15 minutes before physical activity. - Rescue medications: albuterol 4 puffs every 4-6 hours as needed - Changes during respiratory infections or worsening symptoms: Increase Flovent 171mcg to 4 puffs twice daily for TWO WEEKS. - Asthma control goals:  * Full participation in all desired activities (may need albuterol before activity) * Albuterol use two time or less a week on average (not counting use with activity) * Cough interfering with sleep two time or less a month * Oral steroids no more than once a year * No hospitalizations  2. Seasonal and perennial allergic rhinitis (grasses, ragweed, trees, indoor molds, outdoor molds, cat and dog) - Continue allergy shots at the same schedule. - You will not get much relief until you get into your Red Vial (most concentrated).  - Continue with: Allegra (fexofenadine) 180mg  table once daily and Flonase (fluticasone) two sprays per nostril daily and Astelin (azelastine) 2 sprays per nostril 1-2 times daily as needed - Try adding on salt water rinses before the nose sprays.  3. Return in about 6 months (around 02/15/2020). This can be an in-person, a virtual Webex or a telephone follow up visit.  Subjective:   Adam Peters is a 69 y.o. male presenting today for follow up of  Chief Complaint  Patient presents with  . Allergic Rhinitis     Adam Peters has a history of the following: Patient Active  Problem List   Diagnosis Date Noted  . Ex-smoker 04/04/2019  . Thrombocytopenia ([place]) 01/12/2018  . Essential hypertension   . Hyperlipidemia   . History of tobacco use 07/08/2012  . Glaucoma 04/08/2012  . DDD (degenerative disc disease), cervical   . CAD- LAD BMS '97, RCA DES 08/02/13 08/17/2011    History obtained from: chart review and patient.  [name] is a 69 y.o. male presenting for a follow up visit.  He was last seen in December 2020.  At that time, his lung testing looked great.  We continued with Flovent 2 puffs twice daily with a spacer and albuterol as needed.  For his allergic rhinitis, we discussed allergen immunotherapy and he decided to go ahead and do that.  We continued his Allegra as well as Flonase and Astelin.  Since last visit, he has done well.  He did receive both of his COVID-19 vaccines.  Asthma/Respiratory Symptom History: He reports that his breathing is fairly good. he does have OSA and he awsakens with a headache. He reports that is CPAP is making noise and he has not taken it for a new one. He remains on the Flovent 2 puffs twice daily with a spacer.  He has not been using his albuterol at all.  ACT score is 25, indicating excellent asthma control.  Allergic Rhinitis Symptom History: He remains on the nose sprays. He has been using them consistently. He has not needed natibiotics at all.  He remains on allergy shots and is only in the blue vial.  He is continuing to have congestion.  He is wondering when the allergy  shots will start working.  He is not interested in prednisone or antibiotics.  Otherwise, there have been no changes to his past medical history, surgical history, family history, or social history.    Review of Systems  Constitutional: Negative.  Negative for chills, fever, malaise/fatigue and weight loss.  HENT: Positive for congestion and sinus pain. Negative for ear discharge and ear pain.   Eyes: Negative for pain, discharge and redness.   Respiratory: Negative for cough, sputum production, shortness of breath and wheezing.   Cardiovascular: Negative.  Negative for chest pain and palpitations.  Gastrointestinal: Negative for abdominal pain, constipation, diarrhea, heartburn, nausea and vomiting.  Skin: Negative.  Negative for itching and rash.  Neurological: Negative for dizziness and headaches.  Endo/Heme/Allergies: Negative for environmental allergies. Does not bruise/bleed easily.       Objective:   Blood pressure 124/70, pulse 66, temperature (!) 97.5 F (36.4 C), temperature source Temporal, resp. rate 16, height 6\' 2"  (1.88 m), weight 218 lb 12.8 oz (99.2 kg), SpO2 99 %. Body mass index is 28.09 kg/m.   Physical Exam:  Physical Exam  Constitutional: He appears well-developed and well-nourished.  Talkative male.  HENT:  Head: Normocephalic and atraumatic.  Right Ear: Tympanic membrane, external ear and ear canal normal.  Left Ear: Tympanic membrane, external ear and ear canal normal.  Nose: Mucosal edema and rhinorrhea present. No nose lacerations, nasal deformity or septal deviation. No epistaxis. Right sinus exhibits no maxillary sinus tenderness and no frontal sinus tenderness. Left sinus exhibits no maxillary sinus tenderness and no frontal sinus tenderness.  Mouth/Throat: Uvula is midline and oropharynx is clear and moist. Mucous membranes are not pale and not dry.  Tonsils unremarkable.  Eyes: Pupils are equal, round, and reactive to light. Conjunctivae and EOM are normal. Right eye exhibits no chemosis and no discharge. Left eye exhibits no chemosis and no discharge. Right conjunctiva is not injected. Left conjunctiva is not injected.  Cardiovascular: Normal rate, regular rhythm and normal heart sounds.  Respiratory: Effort normal. No accessory muscle usage. No tachypnea. No respiratory distress. He has no wheezes. He has rhonchi. He has no rales. He exhibits no tenderness.  Rales throughout.  No wheezing  or crackles.  Good air movement throughout the lungs.  Lymphadenopathy:    He has no cervical adenopathy.  Neurological: He is alert.  Skin: No abrasion, no petechiae and no rash noted. Rash is not papular, not vesicular and not urticarial. No erythema. No pallor.  Psychiatric: He has a normal mood and affect.     Diagnostic studies:    Spirometry: results normal (FEV1: 2.62/93%, FVC: 3.79/105%, FEV1/FVC: 69%).    Spirometry consistent with normal pattern.   Allergy Studies: none        Salvatore Marvel, MD  [facility] [place] [facility] [place]

## 2019-08-23 ENCOUNTER — Encounter: Payer: Self-pay | Admitting: Allergy & Immunology

## 2019-09-15 ENCOUNTER — Encounter: Payer: Self-pay | Admitting: Family Medicine

## 2019-09-15 ENCOUNTER — Ambulatory Visit (INDEPENDENT_AMBULATORY_CARE_PROVIDER_SITE_OTHER): Payer: Medicare Other | Admitting: Family Medicine

## 2019-09-15 VITALS — BP 124/70 | Ht 74.0 in | Wt 218.0 lb

## 2019-09-15 DIAGNOSIS — Z0000 Encounter for general adult medical examination without abnormal findings: Secondary | ICD-10-CM
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
## 2019-09-15 NOTE — Patient Instructions (Addendum)
Thank you for taking time to come for your Medicare Wellness Visit. I appreciate your ongoing commitment to your health goals. Please review the following plan we discussed and let me know if I can assist you in the future.  Leroy Kennedy LPN  Preventive Care 70 Years and Older, Male Preventive care refers to lifestyle choices and visits with your health care provider that can promote health and wellness. This includes:  A yearly physical exam. This is also called an annual well check.  Regular dental and eye exams.  Immunizations.  Screening for certain conditions.  Healthy lifestyle choices, such as diet and exercise. What can I expect for my preventive care visit? Physical exam Your health care provider will check:  Height and weight. These may be used to calculate body mass index (BMI), which is a measurement that tells if you are at a healthy weight.  Heart rate and blood pressure.  Your skin for abnormal spots. Counseling Your health care provider may ask you questions about:  Alcohol, tobacco, and drug use.  Emotional well-being.  Home and relationship well-being.  Sexual activity.  Eating habits.  History of falls.  Memory and ability to understand (cognition).  Work and work Statistician. What immunizations do I need?  Influenza (flu) vaccine  This is recommended every year. Tetanus, diphtheria, and pertussis (Tdap) vaccine  You may need a Td booster every 10 years. Varicella (chickenpox) vaccine  You may need this vaccine if you have not already been vaccinated. Zoster (shingles) vaccine  You may need this after age 66. Pneumococcal conjugate (PCV13) vaccine  One dose is recommended after age 84. Pneumococcal polysaccharide (PPSV23) vaccine  One dose is recommended after age 88. Measles, mumps, and rubella (MMR) vaccine  You may need at least one dose of MMR if you were born in 1957 or later. You may also need a second dose. Meningococcal  conjugate (MenACWY) vaccine  You may need this if you have certain conditions. Hepatitis A vaccine  You may need this if you have certain conditions or if you travel or work in places where you may be exposed to hepatitis A. Hepatitis B vaccine  You may need this if you have certain conditions or if you travel or work in places where you may be exposed to hepatitis B. Haemophilus influenzae type b (Hib) vaccine  You may need this if you have certain conditions. You may receive vaccines as individual doses or as more than one vaccine together in one shot (combination vaccines). Talk with your health care provider about the risks and benefits of combination vaccines. What tests do I need? Blood tests  Lipid and cholesterol levels. These may be checked every 5 years, or more frequently depending on your overall health.  Hepatitis C test.  Hepatitis B test. Screening  Lung cancer screening. You may have this screening every year starting at age 24 if you have a 30-pack-year history of smoking and currently smoke or have quit within the past 15 years.  Colorectal cancer screening. All adults should have this screening starting at age 52 and continuing until age 61. Your health care provider may recommend screening at age 57 if you are at increased risk. You will have tests every 1-10 years, depending on your results and the type of screening test.  Prostate cancer screening. Recommendations will vary depending on your family history and other risks.  Diabetes screening. This is done by checking your blood sugar (glucose) after you have not eaten for  a while (fasting). You may have this done every 1-3 years.  Abdominal aortic aneurysm (AAA) screening. You may need this if you are a current or former smoker.  Sexually transmitted disease (STD) testing. Follow these instructions at home: Eating and drinking  Eat a diet that includes fresh fruits and vegetables, whole grains, lean  protein, and low-fat dairy products. Limit your intake of foods with high amounts of sugar, saturated fats, and salt.  Take vitamin and mineral supplements as recommended by your health care provider.  Do not drink alcohol if your health care provider tells you not to drink.  If you drink alcohol: ? Limit how much you have to 0-2 drinks a day. ? Be aware of how much alcohol is in your drink. In the [place] one drink equals one 12 oz bottle of beer (355 mL), one 5 oz glass of wine (148 mL), or one 1 oz glass of hard liquor (44 mL). Lifestyle  Take daily care of your teeth and gums.  Stay active. Exercise for at least 30 minutes on 5 or more days each week.  Do not use any products that contain nicotine or tobacco, such as cigarettes, e-cigarettes, and chewing tobacco. If you need help quitting, ask your health care provider.  If you are sexually active, practice safe sex. Use a condom or other form of protection to prevent STIs (sexually transmitted infections).  Talk with your health care provider about taking a low-dose aspirin or statin. What's next?  Visit your health care provider once a year for a well check visit.  Ask your health care provider how often you should have your eyes and teeth checked.  Stay up to date on all vaccines. This information is not intended to replace advice given to you by your health care provider. Make sure you discuss any questions you have with your health care provider. Document Revised: 04/28/2018 Document Reviewed: 04/28/2018 Elsevier Patient Education  2020 [employer] Inc[employer]  

## 2019-09-22 ENCOUNTER — Other Ambulatory Visit: Payer: Self-pay | Admitting: Internal Medicine

## 2019-10-02 DIAGNOSIS — H2513 Age-related nuclear cataract, bilateral: Secondary | ICD-10-CM | POA: Diagnosis not present

## 2019-10-02 DIAGNOSIS — H40013 Open angle with borderline findings, low risk, bilateral: Secondary | ICD-10-CM | POA: Diagnosis not present

## 2019-10-23 ENCOUNTER — Telehealth: Payer: Self-pay | Admitting: Emergency Medicine

## 2019-10-23 ENCOUNTER — Other Ambulatory Visit: Payer: Self-pay | Admitting: Emergency Medicine

## 2019-10-23 DIAGNOSIS — I10 Essential (primary) hypertension: Secondary | ICD-10-CM

## 2019-10-23 NOTE — Telephone Encounter (Signed)
Spoke to patient about the request for referral to see a psychiatry for mental concern he want to discuss. The patient wants to talk to Dr Mitchel Honour before the referral. He made an appointment for 10/26/2019 at 8:40 am.

## 2019-10-23 NOTE — Telephone Encounter (Signed)
Copied from [place] [phone]. Topic: Referral - Request for Referral >> Oct 23, 2019 12:36 PM Erick Blinks wrote: Has patient seen PCP for this complaint? Yes.   *If NO, is insurance requiring patient see PCP for this issue before PCP can refer them? Referral for which specialty: Psychiatry  Preferred provider/office: Highest recommended  Reason for referral: Wants to discuss some mental health concerns

## 2019-10-24 ENCOUNTER — Encounter: Payer: Self-pay | Admitting: Emergency Medicine

## 2019-10-26 ENCOUNTER — Encounter: Payer: Self-pay | Admitting: Emergency Medicine

## 2019-10-26 ENCOUNTER — Other Ambulatory Visit: Payer: Self-pay

## 2019-10-26 ENCOUNTER — Ambulatory Visit (INDEPENDENT_AMBULATORY_CARE_PROVIDER_SITE_OTHER): Payer: Medicare Other | Admitting: Emergency Medicine

## 2019-10-26 VITALS — BP 152/75 | HR 64 | Temp 97.7°F | Resp 16 | Ht 74.0 in | Wt 222.0 lb

## 2019-10-26 DIAGNOSIS — F331 Major depressive disorder, recurrent, moderate: Secondary | ICD-10-CM

## 2019-10-26 DIAGNOSIS — I2510 Atherosclerotic heart disease of native coronary artery without angina pectoris: Secondary | ICD-10-CM

## 2019-10-26 DIAGNOSIS — L819 Disorder of pigmentation, unspecified: Secondary | ICD-10-CM

## 2019-10-26 DIAGNOSIS — Z8659 Personal history of other mental and behavioral disorders: Secondary | ICD-10-CM
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
## 2019-10-26 NOTE — Patient Instructions (Addendum)
If you have lab work done today you will be contacted with your lab results within the next 2 weeks.  If you have not heard from [place] then please contact us. The fastest way to get your results is to register for My Chart.   IF you received an x-ray today, you will receive an invoice from [facility]. Please contact [facility] Radiology at [phone] with questions or concerns regarding your invoice.   IF you received labwork today, you will receive an invoice from [place]. Please contact [facility] at [phone] with questions or concerns regarding your invoice.   Our billing staff will not be able to assist you with questions regarding bills from these companies.  You will be contacted with the lab results as soon as they are available. The fastest way to get your results is to activate your My Chart account. Instructions are located on the last page of this paperwork. If you have not heard from [place] regarding the results in 2 weeks, please contact this office.     Major Depressive Disorder, Adult Major depressive disorder (MDD) is a mental health condition. MDD often makes you feel sad, hopeless, or helpless. MDD can also cause symptoms in your body. MDD can affect your:  Work.  School.  Relationships.  Other normal activities. MDD can range from mild to very bad. It may occur once (single episode MDD). It can also occur many times (recurrent MDD). The main symptoms of MDD often include:  Feeling sad, depressed, or irritable most of the time.  Loss of interest. MDD symptoms also include:  Sleeping too much or too little.  Eating too much or too little.  A change in your weight.  Feeling tired (fatigue) or having low energy.  Feeling worthless.  Feeling guilty.  Trouble making decisions.  Trouble thinking clearly.  Thoughts of suicide or harming others.  Feeling weak.  Feeling agitated.  Keeping yourself from being around other people  (isolation). Follow these instructions at home: Activity  Do these things as told by your doctor: ? Go back to your normal activities. ? Exercise regularly. ? Spend time outdoors. Alcohol  Talk with your doctor about how alcohol can affect your antidepressant medicines.  Do not drink alcohol. Or, limit how much alcohol you drink. ? This means no more than 1 drink a day for nonpregnant women and 2 drinks a day for men. One drink equals one of these:  12 oz of beer.  5 oz of wine.  1 oz of hard liquor. General instructions  Take over-the-counter and prescription medicines only as told by your doctor.  Eat a healthy diet.  Get plenty of sleep.  Find activities that you enjoy. Make time to do them.  Think about joining a support group. Your doctor may be able to suggest a group for you.  Keep all follow-up visits as told by your doctor. This is important. Where to find more information:  [employer] on Mental Illness: ? www.nami.[place]: ? https://carter.com/  National Suicide Prevention Lifeline: ? 772-314-7376. This is free, 24-hour help. Contact a doctor if:  Your symptoms get worse.  You have new symptoms. Get help right away if:  You self-harm.  You see, hear, taste, smell, or feel things that are not present (hallucinate). If you ever feel like you may hurt yourself or others, or have thoughts about taking your own life, get help right away. You can go to your  nearest emergency department or call:  Your local emergency services (911 in the [place]  A suicide crisis helpline, such as the National Suicide Prevention Lifeline: ? 220-635-6271. This is open 24 hours a day. This information is not intended to replace advice given to you by your health care provider. Make sure you discuss any questions you have with your health care provider. Document Revised: 04/16/2017 Document Reviewed: 01/19/2016 Elsevier Patient  Education  2020 [employer].

## 2019-11-08 ENCOUNTER — Other Ambulatory Visit: Payer: Self-pay | Admitting: Internal Medicine

## 2019-11-10 DIAGNOSIS — H25013 Cortical age-related cataract, bilateral: Secondary | ICD-10-CM | POA: Diagnosis not present

## 2019-11-10 DIAGNOSIS — H25043 Posterior subcapsular polar age-related cataract, bilateral: Secondary | ICD-10-CM | POA: Diagnosis not present

## 2019-11-10 DIAGNOSIS — H40013 Open angle with borderline findings, low risk, bilateral: Secondary | ICD-10-CM | POA: Diagnosis not present

## 2019-11-10 DIAGNOSIS — H2513 Age-related nuclear cataract, bilateral: Secondary | ICD-10-CM | POA: Diagnosis not present

## 2019-11-13 ENCOUNTER — Encounter: Payer: Self-pay | Admitting: Allergy & Immunology

## 2019-11-14 ENCOUNTER — Other Ambulatory Visit: Payer: Self-pay

## 2019-11-14 MED ORDER — AZELASTINE HCL 0.1 % NA SOLN: NASAL | 1 refills | Status: DC

## 2019-11-23 ENCOUNTER — Other Ambulatory Visit: Payer: Self-pay | Admitting: Internal Medicine

## 2019-12-05 ENCOUNTER — Encounter: Payer: Self-pay | Admitting: Emergency Medicine

## 2019-12-05 ENCOUNTER — Other Ambulatory Visit: Payer: Self-pay

## 2019-12-05 ENCOUNTER — Ambulatory Visit (INDEPENDENT_AMBULATORY_CARE_PROVIDER_SITE_OTHER): Payer: Medicare Other | Admitting: Emergency Medicine

## 2019-12-05 VITALS — BP 128/71 | HR 70 | Temp 97.2°F | Resp 16 | Ht 74.0 in | Wt 223.0 lb

## 2019-12-05 DIAGNOSIS — I2510 Atherosclerotic heart disease of native coronary artery without angina pectoris: Secondary | ICD-10-CM | POA: Diagnosis not present

## 2019-12-05 DIAGNOSIS — I10 Essential (primary) hypertension: Secondary | ICD-10-CM

## 2019-12-05 DIAGNOSIS — Z8659 Personal history of other mental and behavioral disorders: Secondary | ICD-10-CM | POA: Diagnosis not present

## 2019-12-05 NOTE — Patient Instructions (Addendum)
[place]:  [phone] contact number.   If you have lab work done today you will be contacted with your lab results within the next 2 weeks.  If you have not heard from [place] then please contact us. The fastest way to get your results is to register for My Chart.   IF you received an x-ray today, you will receive an invoice from [facility]. Please contact [facility] at [phone] with questions or concerns regarding your invoice.   IF you received labwork today, you will receive an invoice from [facility]. Please contact [facility] at [phone] with questions or concerns regarding your invoice.   Our billing staff will not be able to assist you with questions regarding bills from these companies.  You will be contacted with the lab results as soon as they are available. The fastest way to get your results is to activate your My Chart account. Instructions are located on the last page of this paperwork. If you have not heard from [place] regarding the results in 2 weeks, please contact this office.     Hypertension, Adult High blood pressure (hypertension) is when the force of blood pumping through the arteries is too strong. The arteries are the blood vessels that carry blood from the heart throughout the body. Hypertension forces the heart to work harder to pump blood and may cause arteries to become narrow or stiff. Untreated or uncontrolled hypertension can cause a heart attack, heart failure, a stroke, kidney disease, and other problems. A blood pressure reading consists of a higher number over a lower number. Ideally, your blood pressure should be below 120/80. The first ("top") number is called the systolic pressure. It is a measure of the pressure in your arteries as your heart beats. The second ("bottom") number is called the diastolic pressure. It is a measure of the pressure in your arteries as the heart relaxes. What are the causes? The exact  cause of this condition is not known. There are some conditions that result in or are related to high blood pressure. What increases the risk? Some risk factors for high blood pressure are under your control. The following factors may make you more likely to develop this condition:  Smoking.  Having type 2 diabetes mellitus, high cholesterol, or both.  Not getting enough exercise or physical activity.  Being overweight.  Having too much fat, sugar, calories, or salt (sodium) in your diet.  Drinking too much alcohol. Some risk factors for high blood pressure may be difficult or impossible to change. Some of these factors include:  Having chronic kidney disease.  Having a family history of high blood pressure.  Age. Risk increases with age.  Race. You may be at higher risk if you are African American.  Gender. Men are at higher risk than women before age 28. After age 58, women are at higher risk than men.  Having obstructive sleep apnea.  Stress. What are the signs or symptoms? High blood pressure may not cause symptoms. Very high blood pressure (hypertensive crisis) may cause:  Headache.  Anxiety.  Shortness of breath.  Nosebleed.  Nausea and vomiting.  Vision changes.  Severe chest pain.  Seizures. How is this diagnosed? This condition is diagnosed by measuring your blood pressure while you are seated, with your arm resting on a flat surface, your legs uncrossed, and your feet flat on the floor. The cuff of the blood pressure monitor will be placed directly against the skin of your upper  arm at the level of your heart. It should be measured at least twice using the same arm. Certain conditions can cause a difference in blood pressure between your right and left arms. Certain factors can cause blood pressure readings to be lower or higher than normal for a short period of time:  When your blood pressure is higher when you are in a health care provider's office than  when you are at home, this is called white coat hypertension. Most people with this condition do not need medicines.  When your blood pressure is higher at home than when you are in a health care provider's office, this is called masked hypertension. Most people with this condition may need medicines to control blood pressure. If you have a high blood pressure reading during one visit or you have normal blood pressure with other risk factors, you may be asked to:  Return on a different day to have your blood pressure checked again.  Monitor your blood pressure at home for 1 week or longer. If you are diagnosed with hypertension, you may have other blood or imaging tests to help your health care provider understand your overall risk for other conditions. How is this treated? This condition is treated by making healthy lifestyle changes, such as eating healthy foods, exercising more, and reducing your alcohol intake. Your health care provider may prescribe medicine if lifestyle changes are not enough to get your blood pressure under control, and if:  Your systolic blood pressure is above 130.  Your diastolic blood pressure is above 80. Your personal target blood pressure may vary depending on your medical conditions, your age, and other factors. Follow these instructions at home: Eating and drinking   Eat a diet that is high in fiber and potassium, and low in sodium, added sugar, and fat. An example eating plan is called the DASH (Dietary Approaches to Stop Hypertension) diet. To eat this way: ? Eat plenty of fresh fruits and vegetables. Try to fill one half of your plate at each meal with fruits and vegetables. ? Eat whole grains, such as whole-wheat pasta, brown rice, or whole-grain bread. Fill about one fourth of your plate with whole grains. ? Eat or drink low-fat dairy products, such as skim milk or low-fat yogurt. ? Avoid fatty cuts of meat, processed or cured meats, and poultry with  skin. Fill about one fourth of your plate with lean proteins, such as fish, chicken without skin, beans, eggs, or tofu. ? Avoid pre-made and processed foods. These tend to be higher in sodium, added sugar, and fat.  Reduce your daily sodium intake. Most people with hypertension should eat less than 1,500 mg of sodium a day.  Do not drink alcohol if: ? Your health care provider tells you not to drink. ? You are pregnant, may be pregnant, or are planning to become pregnant.  If you drink alcohol: ? Limit how much you use to:  0-1 drink a day for women.  0-2 drinks a day for men. ? Be aware of how much alcohol is in your drink. In the [place] one drink equals one 12 oz bottle of beer (355 mL), one 5 oz glass of wine (148 mL), or one 1 oz glass of hard liquor (44 mL). Lifestyle   Work with your health care provider to maintain a healthy body weight or to lose weight. Ask what an ideal weight is for you.  Get at least 30 minutes of exercise most days of the  week. Activities may include walking, swimming, or biking.  Include exercise to strengthen your muscles (resistance exercise), such as Pilates or lifting weights, as part of your weekly exercise routine. Try to do these types of exercises for 30 minutes at least 3 days a week.  Do not use any products that contain nicotine or tobacco, such as cigarettes, e-cigarettes, and chewing tobacco. If you need help quitting, ask your health care provider.  Monitor your blood pressure at home as told by your health care provider.  Keep all follow-up visits as told by your health care provider. This is important. Medicines  Take over-the-counter and prescription medicines only as told by your health care provider. Follow directions carefully. Blood pressure medicines must be taken as prescribed.  Do not skip doses of blood pressure medicine. Doing this puts you at risk for problems and can make the medicine less effective.  Ask your health care  provider about side effects or reactions to medicines that you should watch for. Contact a health care provider if you:  Think you are having a reaction to a medicine you are taking.  Have headaches that keep coming back (recurring).  Feel dizzy.  Have swelling in your ankles.  Have trouble with your vision. Get help right away if you:  Develop a severe headache or confusion.  Have unusual weakness or numbness.  Feel faint.  Have severe pain in your chest or abdomen.  Vomit repeatedly.  Have trouble breathing. Summary  Hypertension is when the force of blood pumping through your arteries is too strong. If this condition is not controlled, it may put you at risk for serious complications.  Your personal target blood pressure may vary depending on your medical conditions, your age, and other factors. For most people, a normal blood pressure is less than 120/80.  Hypertension is treated with lifestyle changes, medicines, or a combination of both. Lifestyle changes include losing weight, eating a healthy, low-sodium diet, exercising more, and limiting alcohol. This information is not intended to replace advice given to you by your health care provider. Make sure you discuss any questions you have with your health care provider. Document Revised: 01/12/2018 Document Reviewed: 01/12/2018 [employer] Patient Education  2020 [employer].
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
## 2019-12-05 NOTE — Assessment & Plan Note (Signed)
Well-controlled hypertension.  Continue present medications.  No changes. Diet and nutrition discussed. Follow-up in 6 months. 

## 2019-12-11 ENCOUNTER — Ambulatory Visit (INDEPENDENT_AMBULATORY_CARE_PROVIDER_SITE_OTHER): Payer: Medicare Other | Admitting: Dermatology

## 2019-12-11 ENCOUNTER — Encounter: Payer: Self-pay | Admitting: Dermatology

## 2019-12-11 ENCOUNTER — Other Ambulatory Visit: Payer: Self-pay

## 2019-12-11 DIAGNOSIS — L280 Lichen simplex chronicus: Secondary | ICD-10-CM | POA: Diagnosis not present

## 2019-12-11 DIAGNOSIS — I2510 Atherosclerotic heart disease of native coronary artery without angina pectoris: Secondary | ICD-10-CM | POA: Diagnosis not present

## 2019-12-11 DIAGNOSIS — D485 Neoplasm of uncertain behavior of skin: Secondary | ICD-10-CM | POA: Diagnosis not present

## 2019-12-11 NOTE — Patient Instructions (Addendum)
Biopsy, Surgery (Curettage) & Surgery (Excision) Aftercare Instructions  1. Okay to remove bandage in 24 hours  2. Wash area with soap and water  3. Apply Vaseline to area twice daily until healed (Not Neosporin)  4. Okay to cover with a Band-Aid to decrease the chance of infection or prevent irritation from clothing; also it's okay to uncover lesion at home.  5. Suture instructions: return to our office in 7-10 or 10-14 days for a nurse visit for suture removal. Variable healing with sutures, if pain or itching occurs call our office. It's okay to shower or bathe 24 hours after sutures are given.  6. The following risks may occur after a biopsy, curettage or excision: bleeding, scarring, discoloration, recurrence, infection (redness, yellow drainage, pain or swelling).  7. For questions, concerns and results call our office at [place] before 4pm & Friday before 3pm. Biopsy results will be available in 1 week.  First follow-up in several years for Adam Peters date of birth [birth year].  He is concerned about a mole that has grown on the back of his scalp.  He is aware that he does frequently pick at this area.  Examination showed a lichenified 2mm thickened dark papule.  Shave biopsy done and the base cauterized.  I explained to [name] that this could either be a keratosis or wart but was unlikely to be a skin cancer.  He fully understands that if he unconsciously picks at this area, recurrence is the rule.  There are no restrictions on activity and if the spot is smooth in 6 weeks he can cancel his follow-up.  The rest of his head and neck examination was entirely normal.

## 2020-01-07 ENCOUNTER — Other Ambulatory Visit: Payer: Self-pay | Admitting: Allergy & Immunology

## 2020-01-17 ENCOUNTER — Encounter: Payer: Self-pay | Admitting: Dermatology

## 2020-01-17 NOTE — Progress Notes (Signed)
   Follow-Up Visit   Subjective  Adam Peters is a 69 y.o. male who presents for the following: new problem (Pt stated --mole back of the head--irritate when shaving. 2 years. Denied pain.).  Growth Location: Left scalp Duration:  Quality:  Associated Signs/Symptoms: Itch, sting Modifying Factors:  Severity:  Timing: Context:   Objective  Well appearing patient in no apparent distress; mood and affect are within normal limits.  A focused examination was performed including Head and neck examined today.. Relevant physical exam findings are noted in the Assessment and Plan.   Assessment & Plan    Neoplasm of uncertain behavior of skin Left Occipital Scalp  Skin / nail biopsy Type of biopsy: tangential   Informed consent: discussed and consent obtained   Timeout: patient name, date of birth, surgical site, and procedure verified   Anesthesia: the lesion was anesthetized in a standard fashion   Anesthetic:  1% lidocaine w/ epinephrine 1-100,000 local infiltration Instrument used: flexible razor blade   Hemostasis achieved with: ferric subsulfate   Outcome: patient tolerated procedure well   Post-procedure details: wound care instructions given    Specimen 1 - Surgical pathology Differential Diagnosis: wart Check Margins: No Cautery only   First follow-up in several years for Adam Peters date of birth 1950-12-15.  He is concerned about a mole that has grown on the back of his scalp.  He is aware that he does frequently pick at this area.  Examination showed a lichenified 80mm thickened dark papule.  Shave biopsy done and the base cauterized.  I explained to Adam Peters that this could either be a keratosis or wart but was unlikely to be a skin cancer.  He fully understands that if he unconsciously picks at this area, recurrence is the rule.  There are no restrictions on activity and if the spot is smooth in 6 weeks he can cancel his follow-up.  The rest of  his head and neck examination was entirely normal.   I, Lavonna Monarch, MD, have reviewed all documentation for this visit.  The documentation on 01/17/20 for the exam, diagnosis, procedures, and orders are all accurate and complete.

## 2020-01-23 ENCOUNTER — Other Ambulatory Visit: Payer: Self-pay

## 2020-01-23 ENCOUNTER — Ambulatory Visit (INDEPENDENT_AMBULATORY_CARE_PROVIDER_SITE_OTHER): Payer: Medicare Other | Admitting: Dermatology

## 2020-01-23 ENCOUNTER — Encounter: Payer: Self-pay | Admitting: Dermatology

## 2020-01-23 DIAGNOSIS — L729 Follicular cyst of the skin and subcutaneous tissue, unspecified: Secondary | ICD-10-CM | POA: Diagnosis not present

## 2020-01-23 DIAGNOSIS — I2510 Atherosclerotic heart disease of native coronary artery without angina pectoris: Secondary | ICD-10-CM

## 2020-01-23 DIAGNOSIS — L309 Dermatitis, unspecified: Secondary | ICD-10-CM

## 2020-01-23 NOTE — Patient Instructions (Addendum)
Follow up for Adam Peters date of birth October 26 sixth 1952.  The lesion removed from his left back scalp showed a combination of a cyst with overlying inflammation compatible with lichen simplex.  It healed extremely well with perfect contour and no scar just a little bit of hyperpigmentation.  There is no indication to do any more procedure nor does he need any long-term follow-up.  If this flares he knows he can call me anytime, otherwise follow-up will be on a as needed basis.

## 2020-02-12 ENCOUNTER — Telehealth: Payer: Self-pay | Admitting: Internal Medicine

## 2020-02-12 NOTE — Telephone Encounter (Signed)
LVM FOR PATIENT TO RETURN CALL TO GET FOLLOW UP SCHEDULED WITH [name] FROM RECALL LISTS

## 2020-02-13 ENCOUNTER — Ambulatory Visit: Payer: Medicare Other | Admitting: Allergy & Immunology

## 2020-02-17 ENCOUNTER — Encounter: Payer: Self-pay | Admitting: Dermatology

## 2020-02-17 NOTE — Progress Notes (Signed)
   Follow-Up Visit   Subjective  LACHLAN MCKIM is a 69 y.o. male who presents for the following: Follow-up (6 week follow up from procedure at the back of head).  Growth Location: Scalp Duration:  Quality:  Associated Signs/Symptoms: Modifying Factors: Removed and left a dark spot Severity:  Timing: Context:   Objective  Well appearing patient in no apparent distress; mood and affect are within normal limits.  A focused examination was performed including Head and neck.. Relevant physical exam findings are noted in the Assessment and Plan.   Assessment & Plan    Dermatitis Mid Occipital Scalp  Cyst of skin Scalp  Leave if stable  Follow up for Madyx Delfin date of birth October 26 sixth 1952.  The lesion removed from his left back scalp showed a combination of a cyst with overlying inflammation compatible with lichen simplex.  It healed extremely well with perfect contour and no scar just a little bit of hyperpigmentation.  There is no indication to do any more procedure nor does he need any long-term follow-up.  If this flares he knows he can call me anytime, otherwise follow-up will be on a as needed basis.   I, Lavonna Monarch, MD, have reviewed all documentation for this visit.  The documentation on 02/17/20 for the exam, diagnosis, procedures, and orders are all accurate and complete.

## 2020-02-18 ENCOUNTER — Other Ambulatory Visit: Payer: Self-pay | Admitting: Emergency Medicine

## 2020-02-18 DIAGNOSIS — I10 Essential (primary) hypertension: Secondary | ICD-10-CM

## 2020-02-18 NOTE — Telephone Encounter (Signed)
Requested Prescriptions  Pending Prescriptions Disp Refills  . olmesartan (BENICAR) 20 MG tablet [Pharmacy Med Name: OLMESARTAN MEDOXOMIL TABS 20MG ] 90 tablet 1    Sig: TAKE 1 TABLET DAILY     Cardiovascular:  Angiotensin Receptor Blockers Failed - 02/18/2020  1:22 PM      Failed - Cr in normal range and within 180 days    Creat  Date Value Ref Range Status  08/25/2016 1.24 0.70 - 1.25 mg/dL Final    Comment:      For patients > or = 70 years of age: The upper reference limit for Creatinine is approximately 13% higher for people identified as African-American.      Creatinine, Ser  Date Value Ref Range Status  01/14/2018 0.97 0.76 - 1.27 mg/dL Final         Failed - K in normal range and within 180 days    Potassium  Date Value Ref Range Status  01/14/2018 4.1 3.5 - 5.2 mmol/L Final         Passed - Patient is not pregnant      Passed - Last BP in normal range    BP Readings from Last 1 Encounters:  12/05/19 128/71         Passed - Valid encounter within last 6 months    Recent Outpatient Visits          2 months ago Essential hypertension   [facility] [place], [place], MD   3 months ago Moderate episode of recurrent major depressive disorder [facility])   [facility] at [place], Ines Bloomer, MD   5 months ago Medicare annual wellness visit, subsequent   [facility] at [facility], Arlie Solomons, MD   8 months ago Essential hypertension   [facility] at [place], Ines Bloomer, MD   1 year ago Skin lesions   [facility] at [facility], Ines Bloomer, MD      Future Appointments            In 2 days Valentina Shaggy, MD [facility] [place]   In 2 weeks [name], Nadean Corwin, MD [facility] [place], [facility]   In 3 months Sagardia, Ines Bloomer, MD [facility] [place], [facility]

## 2020-02-20 ENCOUNTER — Other Ambulatory Visit: Payer: Self-pay

## 2020-02-20 ENCOUNTER — Encounter: Payer: Self-pay | Admitting: Allergy & Immunology

## 2020-02-20 ENCOUNTER — Ambulatory Visit (INDEPENDENT_AMBULATORY_CARE_PROVIDER_SITE_OTHER): Payer: Medicare Other | Admitting: Allergy & Immunology

## 2020-02-20 VITALS — BP 124/82 | HR 63 | Temp 98.5°F | Resp 16

## 2020-02-20 DIAGNOSIS — J302 Other seasonal allergic rhinitis: Secondary | ICD-10-CM

## 2020-02-20 DIAGNOSIS — J4530 Mild persistent asthma, uncomplicated: Secondary | ICD-10-CM

## 2020-02-20 DIAGNOSIS — J3089 Other allergic rhinitis: Secondary | ICD-10-CM

## 2020-02-20 DIAGNOSIS — I2510 Atherosclerotic heart disease of native coronary artery without angina pectoris: Secondary | ICD-10-CM | POA: Diagnosis not present

## 2020-02-20 MED ORDER — ALBUTEROL SULFATE HFA 108 (90 BASE) MCG/ACT IN AERS: 2.0000 | INHALATION_SPRAY | Freq: Four times a day (QID) | RESPIRATORY_TRACT | 1 refills | Status: DC | PRN

## 2020-02-20 NOTE — Progress Notes (Signed)
FOLLOW UP  Date of Service/Encounter:  02/20/20   Assessment:   Mild intermittent asthma, uncomplicated  Seasonal and perennial allergic rhinitis(grasses, ragweed, trees, indoor molds, outdoor molds, cat and dog)  Train lover   Plan/Recommendations:   1. Mild persistent asthma, uncomplicated - Lung testing deferred today. - We are not going to make any medication changes today since you are doing so well.  - Daily controller medication(s): Flovent 125mcg 2 puffs twice daily with spacer - Prior to physical activity: albuterol 2 puffs 10-15 minutes before physical activity. - Rescue medications: albuterol 4 puffs every 4-6 hours as needed - Changes during respiratory infections or worsening symptoms: Increase Flovent 180mcg to 4 puffs twice daily for TWO WEEKS. - Asthma control goals:  * Full participation in all desired activities (may need albuterol before activity) * Albuterol use two time or less a week on average (not counting use with activity) * Cough interfering with sleep two time or less a month * Oral steroids no more than once a year * No hospitalizations  2. Seasonal and perennial allergic rhinitis (grasses, ragweed, trees, indoor molds, outdoor molds, cat and dog) - Continue with: Allegra (fexofenadine) 180mg  table once daily and Flonase (fluticasone) two sprays per nostril daily and Astelin (azelastine) 2 sprays per nostril 1-2 times daily AS NEEDED.  - Try adding on salt water rinses before the nose sprays.  3. Return in about 6 months (around 08/20/2020).   Subjective:   Adam Peters is a 69 y.o. male presenting today for follow up of  Chief Complaint  Patient presents with  . Asthma  . Allergic Rhinitis     Adam Peters has a history of the following: Patient Active Problem List   Diagnosis Date Noted  . Ex-smoker 04/04/2019  . Essential hypertension   . Hyperlipidemia   . History of tobacco use 07/08/2012  . Glaucoma 04/08/2012  .  DDD (degenerative disc disease), cervical   . CAD- LAD BMS '97, RCA DES 08/02/13 08/17/2011    History obtained from: chart review and patient.  Adam Peters is a 69 y.o. male presenting for a follow up visit.  He was last seen in March 2021.  At that time, lung testing looked great.  We continue with Flovent 2 puffs twice daily with a spacer.  He does increase his Flovent to 4 puffs twice daily during flares.  For his allergic rhinitis, we continue with Allegra as well as Flonase and Astelin.  We also continue with allergy shots.  Since the last visit, he has done very well.  He is very talkative today, as always.  He has been spending a lot of time going up to [place] to visit his mother.  He likes to take the train because he does not like being in traffic.  He takes that to just outside of [place].  It is around a 6-hour ride. He has not ridden the [place], but he spends a lot of time discussing high speed trains.   Asthma/Respiratory Symptom History: He remains on the Flovent two puffs twice daily.  Adam Peters's asthma has been well controlled. He has not required rescue medication, experienced nocturnal awakenings due to lower respiratory symptoms, nor have activities of daily living been limited. He has required no Emergency Department or Urgent Care visits for his asthma. He has required zero courses of systemic steroids for asthma exacerbations since the last visit. ACT score today is 22, indicating excellent asthma symptom control.    Allergic  Rhinitis Symptom History: He stopped the shots because he was traveling so much. He is using the Flonase twice daily.  He does use Allegra every day as well.  He is not using Astelin every single day.  He was not having any issues with the allergy shots, but just was not able to come consistently enough to make it worthwhile.  He is scheduled for the third [place] in October 2021. He did have some sleeping from [employer] #2. Otherwise he did fine. He goes  to [place].   Otherwise, there have been no changes to his past medical history, surgical history, family history, or social history.    Review of Systems  Constitutional: Negative.  Negative for chills, fever, malaise/fatigue and weight loss.  HENT: Positive for congestion. Negative for ear discharge, ear pain and sinus pain.   Eyes: Negative for pain, discharge and redness.  Respiratory: Negative for cough, sputum production, shortness of breath and wheezing.   Cardiovascular: Negative.  Negative for chest pain and palpitations.  Gastrointestinal: Negative for abdominal pain, constipation, diarrhea, heartburn, nausea and vomiting.  Skin: Negative.  Negative for itching and rash.  Neurological: Negative for dizziness and headaches.  Endo/Heme/Allergies: Positive for environmental allergies. Does not bruise/bleed easily.       Objective:   Blood pressure 124/82, pulse 63, temperature 98.5 F (36.9 C), temperature source Temporal, resp. rate 16, SpO2 98 %. There is no height or weight on file to calculate BMI.   Physical Exam:  Physical Exam Constitutional:      Appearance: He is well-developed.     Comments: Very talkative.  HENT:     Head: Normocephalic and atraumatic.     Right Ear: Tympanic membrane, ear canal and external ear normal.     Left Ear: Tympanic membrane, ear canal and external ear normal.     Nose: No nasal deformity, septal deviation, mucosal edema or rhinorrhea.     Right Turbinates: Enlarged and swollen.     Left Turbinates: Enlarged and swollen.     Right Sinus: No maxillary sinus tenderness or frontal sinus tenderness.     Left Sinus: No maxillary sinus tenderness or frontal sinus tenderness.     Mouth/Throat:     Mouth: Mucous membranes are not pale and not dry.     Pharynx: Uvula midline.     Comments: Cobblestoning present in the posterior oropharynx. Eyes:     General:        Right eye: No discharge.        Left eye: No discharge.      Conjunctiva/sclera: Conjunctivae normal.     Right eye: Right conjunctiva is not injected. No chemosis.    Left eye: Left conjunctiva is not injected. No chemosis.    Pupils: Pupils are equal, round, and reactive to light.  Cardiovascular:     Rate and Rhythm: Normal rate and regular rhythm.     Heart sounds: Normal heart sounds.  Pulmonary:     Effort: Pulmonary effort is normal. No tachypnea, accessory muscle usage or respiratory distress.     Breath sounds: Normal breath sounds. No wheezing, rhonchi or rales.     Comments: Moving air well in all lung fields.  No increased work of breathing. Chest:     Chest wall: No tenderness.  Lymphadenopathy:     Cervical: No cervical adenopathy.  Skin:    Coloration: Skin is not pale.     Findings: No abrasion, erythema, petechiae or rash. Rash is  not papular, urticarial or vesicular.  Neurological:     Mental Status: He is alert.  Psychiatric:        Behavior: Behavior is cooperative.      Diagnostic studies: none       Salvatore Marvel, MD  [facility]

## 2020-02-20 NOTE — Patient Instructions (Addendum)
1. Mild persistent asthma, uncomplicated - Lung testing deferred today. - We are not going to make any medication changes today since you are doing so well.  - Daily controller medication(s): Flovent 123mcg 2 puffs twice daily with spacer - Prior to physical activity: albuterol 2 puffs 10-15 minutes before physical activity. - Rescue medications: albuterol 4 puffs every 4-6 hours as needed - Changes during respiratory infections or worsening symptoms: Increase Flovent 155mcg to 4 puffs twice daily for TWO WEEKS. - Asthma control goals:  * Full participation in all desired activities (may need albuterol before activity) * Albuterol use two time or less a week on average (not counting use with activity) * Cough interfering with sleep two time or less a month * Oral steroids no more than once a year * No hospitalizations  2. Seasonal and perennial allergic rhinitis (grasses, ragweed, trees, indoor molds, outdoor molds, cat and dog) - Continue with: Allegra (fexofenadine) 180mg  table once daily and Flonase (fluticasone) two sprays per nostril daily and Astelin (azelastine) 2 sprays per nostril 1-2 times daily AS NEEDED.  - Try adding on salt water rinses before the nose sprays.  3. Return in about 6 months (around 08/20/2020).    Please inform us of any Emergency Department visits, hospitalizations, or changes in symptoms. Call us before going to the ED for breathing or allergy symptoms since we might be able to fit you in for a sick visit. Feel free to contact us anytime with any questions, problems, or concerns.  It was a pleasure to see you again today!  Websites that have reliable patient information: 1. American Academy of Asthma, Allergy, and Immunology: www.aaaai.org 2. Food Allergy Research and Education (FARE): foodallergy.org 3. Mothers of Asthmatics: http://www.asthmacommunitynetwork.org 4. American College of Allergy, Asthma, and Immunology: www.acaai.org   COVID-19 Vaccine  Information can be found at: ShippingScam.co.uk For questions related to vaccine distribution or appointments, please email vaccine@[email or web address] [email or web address] or call [phone])[phone].     "Like" [place] on Facebook and Instagram for our latest updates!       Make sure you are registered to vote! If you have moved or changed any of your contact information, you will need to get this updated before voting!  In some cases, you MAY be able to register to vote online: [email or web address]

## 2020-02-21 ENCOUNTER — Other Ambulatory Visit: Payer: Self-pay | Admitting: Internal Medicine

## 2020-02-28 ENCOUNTER — Other Ambulatory Visit: Payer: Self-pay | Admitting: Internal Medicine

## 2020-03-02 ENCOUNTER — Other Ambulatory Visit: Payer: Self-pay | Admitting: Allergy & Immunology

## 2020-03-04 ENCOUNTER — Other Ambulatory Visit: Payer: Self-pay

## 2020-03-04 ENCOUNTER — Encounter: Payer: Self-pay | Admitting: Internal Medicine

## 2020-03-04 ENCOUNTER — Ambulatory Visit (INDEPENDENT_AMBULATORY_CARE_PROVIDER_SITE_OTHER): Payer: Medicare Other | Admitting: Internal Medicine

## 2020-03-04 VITALS — BP 113/63 | HR 69 | Ht 74.0 in | Wt 219.6 lb

## 2020-03-04 DIAGNOSIS — I2510 Atherosclerotic heart disease of native coronary artery without angina pectoris: Secondary | ICD-10-CM | POA: Diagnosis not present

## 2020-03-04 DIAGNOSIS — I10 Essential (primary) hypertension: Secondary | ICD-10-CM | POA: Diagnosis not present

## 2020-03-04 DIAGNOSIS — E785 Hyperlipidemia, unspecified: Secondary | ICD-10-CM

## 2020-03-04 NOTE — Patient Instructions (Signed)

## 2020-03-04 NOTE — Progress Notes (Signed)
OFFICE NOTE  Chief Complaint:  No complaints  Primary Care Physician: Horald Pollen, MD  HPI:  Adam Peters is a 69 year old gentleman who I have been following for history of coronary disease status post PCI of the LAD in 1997 (with a Palmaz-Schatz stent). He has history of smoking in the past but has discontinued that. Echo in 2010 showed a low normal EF and he had a stress test which was negative. Recently, this past fall he was contemplating treatment for hepatitis C and underwent a stress test which was negative for ischemia. At that time, EF was 51%. He, unfortunately, did not undergo treatment at that time; however, has since been set up to see Dr. Patsy Baltimore at [facility] by you who was contemplating treatment starting in December. He is also contemplating retirement at the end of January of next year.   When I last saw him about 6 months ago he was doing fairly well. I understand he was seen just a few days ago in urgent care. He had been doing some new exercise routine and had noted tightness after exercise in his chest. The symptoms were certainly worse with exertion and ultimately did relieve themselves mostly at rest. Since that time he's had worsening shortness of breath and discomfort in his chest especially when walking up stairs or doing most activities. He appears visibly disturbed by this today. He did have a chest x-ray performed which showed no acute disease. An EKG performed at his primary care provider's office showed sinus bradycardia with no ischemic changes. A troponin was checked and was positive at 0.21.  [name] was subsequently referred for cardiac catheterization and found to have acute right coronary artery occlusion. He underwent placement of a Promus Premier 3.5 x 16 mm drug-eluting stent to the mid right coronary artery. Since then he's had no significant anginal symptoms. He's been active and exercising. Unfortunately he has not been able to get  treatment for his hepatitis C due to a shortage of medications. He has however recently been having problems in his left shoulder and left upper chest. He was concerned about this being ischemia. The symptoms are worse when swimming particularly and he feels pain in his left shoulder when outstretching his arm.  Mr. Horn returns today for follow-up. He is feeling quite well. He denies any chest pain or shortness of breath. He continues to be active and without complaints. He is scheduled to have treatment for his hepatitis C next month. Hopefully that can be cured. He's a wart a repeat check of his cholesterol. It's now been almost one year since his drug-eluting stent placement  I saw [name] back in the office today. He is doing exceedingly well. He denies any chest pain or shortness of breath. He recently had treatment of his hepatitis C which is cured by Harvoni. He was having some reflux symptoms but those have improved with an increase in his Prilosec to 40 mg daily. Blood pressure is well-controlled.  01/23/2016  [name] returns today for follow-up. He reports recently he started swimming again and he's noted some shortness of breath which is unusual for him. He's gone swimming 3 times and is completely exhausted after doing 1 lap in the pool. [name] it is a 95 m pool. He says that he doesn't get that short of breath when exercising on a treadmill or doing other activities. He's not sure if it's deconditioning. He does not have any chest pain. It's  now been 2 years since his last coronary stent. He is also not had a repeat lipid profile in one year. Previously his cholesterol is been well controlled.  02/21/2016  [name] was seen today in follow-up. He underwent nuclear stress testing which was negative for ischemia. He still feels some fatigue and dyspnea with exertion. He's not exercising as much as he had been. He thinks it may be related to the beta  blocker.  08/24/2016  [name] returns today for follow-up. He seems to be doing extremely well. He's exercising regularly at the [facility] and is lost almost 30 pounds from 235 down to 208 pounds. Blood pressure is actually low normal today 102/68. He says he feels great. He denies any worsening chest pain or shortness of breath. He was successfully treated for hepatitis C with Harvoni and is now cured. Of note his recent lipid profile in September showed total cholesterol 157, HDL-C 44, LDL-C 100 and triglycerides 66. Goal LDL-C less than 70. He is on low-dose Lipitor because of elevated liver enzymes however this is likely related to hepatitis C which is now cured.  02/22/2017   [name] returns for follow-up. Over the past year he's done well. He continues to be physically active. He exercises regularly. He feels great. His last LDL was 74 6 months ago. This is down from 100 one year ago. He is on atorvastatin 20 mg daily. We have previously increase the dose from 10 mg. He denies any worsening shortness of breath. He occasionally gets some cramping in his legs when he runs. Blood pressures have been well controlled.  01/07/2018  [name] seen today in follow-up.  He denies any chest pain or worsening shortness of breath.  Blood pressure is well controlled today 126/64.  He is physically active and exercises regularly.  In fact recently called in to see if he could increase his exercise and weight lifting which certainly is okay to do.  He is on low-dose atorvastatin.  This is primarily in the past due to hepatitis however that has been cured and notably recently his LDL was 83, still not at goal less than 70.  01/10/2019  [name] is seen today for follow-up.  Overall seems to be doing well.  He was hospitalized in the fall for dehydration.  This is resolved and he is been more cautious about it.  Recently he was doing similar exercises and thinks he did an unusual type of push-up with  his arms extended wider than his shoulders.  He then developed some pain in the left shoulder.  This is been persistent and he is now stopped his exercises.  He is concerned about a possible rotator cuff injury.  He denies any cardiac chest pain.  He had reasonably controlled lipids with a recent increase in his atorvastatin about 9 months ago but is due for repeat assessment.  03/04/2020  Mr. Majchrzak returns today for routine follow-up.  He continues to do well.  He is physically active and is asymptomatic.  Blood pressure is excellent today 113/63.  He reports compliance with his medications.  He did have lipid testing about a year ago in August showing total cholesterol of 138, HDL 58, LDL of 71 and triglycerides 43.  Hemoglobin A1c is exceedingly low at 4.4.  EKG today shows sinus rhythm at 69 without ischemic changes.  He remains active and tries to exercise.  He was successfully treated and cured of hepatitis C.   PMHx:  Past Medical History:  Diagnosis Date   Allergy    seasonal   Anemia    Anxiety    CAD (coronary artery disease)    a. 1997 PCI/BMS to distal LAD (J&J PS BMS);  b. 03/2011 Neg MV;  c. 07/2013 NSTEMI/PCI: LM 20d, LAD 70m, 50d isr, LCX nl, OM1/2 nl, RCA dom 95-70m (3.5x16 Promus premier DES), EF 55%, mild basal inf HK.   Cataract    Coronary artery disease    s/p PCI to LAD (1997) - Dr. Rockne Menghini   GERD (gastroesophageal reflux disease)    Hepatitis C    History of tobacco use    Hyperlipidemia    Hypertension    Schizophrenia ([place]) 1978    Past Surgical History:  Procedure Laterality Date   [place]  2001   patent LAD stent (Dr Georges Lynch)   [place]  06/09/1995   prox LAD stenting 80% to 0% (Dr. Rockne Menghini) - repeat cath in 11/1999 showed no significant coronary disease & normal systolic function (Dr. Gerrie Nordmann)   CORONARY STENT PLACEMENT  08/02/2013   RCA   DES         DR Waelder   knife wound repair  [place] WITH CORONARY ANGIOGRAM N/A 08/02/2013   Procedure: LEFT HEART CATHETERIZATION WITH CORONARY ANGIOGRAM;  Surgeon: Blane Ohara, MD;  Location: [facility] CATH LAB;  Service: Cardiovascular;  Laterality: N/A;   TRANSTHORACIC ECHOCARDIOGRAM  11/13/2008   EF 50-55%, normal LV systolic function; RA mildly dilated; trace MR/TR/PR    FAMHx:  Family History  Problem Relation Age of Onset   Asthma Mother    Crohn's disease Mother    Heart disease Mother    Hepatitis C Brother    Testicular cancer Brother    Dementia Father    Diabetes Maternal Grandmother    Emphysema Maternal Aunt    Cancer Maternal Aunt    Colon cancer Neg Hx     SOCHx:   reports that he quit smoking about 26 years ago. His smoking use included cigarettes. He smoked 1.00 pack per day. He has never used smokeless tobacco. He reports current alcohol use of about 3.0 standard drinks of alcohol per week. He reports that he does not use drugs.  ALLERGIES:  Allergies  Allergen Reactions   Amoxicillin Diarrhea    GI upset.    ROS: Pertinent items noted in HPI and remainder of comprehensive ROS otherwise negative.  HOME MEDS: Current Outpatient Medications  Medication Sig Dispense Refill   albuterol (VENTOLIN HFA) 108 (90 Base) MCG/ACT inhaler Inhale 2 puffs into the lungs every 6 (six) hours as needed for wheezing or shortness of breath. 54 g 1   alprostadil (EDEX) 20 MCG injection 20 mcg by Intracavitary route as needed for erectile dysfunction. use no more than 3 times per week      amLODipine (NORVASC) 2.5 MG tablet TAKE 1 TABLET DAILY 90 tablet 3   aspirin EC 81 MG tablet Take 81 mg by mouth daily.     atorvastatin (LIPITOR) 40 MG tablet TAKE 1 TABLET DAILY 90 tablet 0   azelastine (ASTELIN) 0.1 % nasal spray USE 2 SPRAYS IN EACH NOSTRIL TWICE A DAY AS NEEDED FOR RHINITIS AS DIRECTED (LAST FILL NEED APPOINTMENT) 90 mL 1    buPROPion (WELLBUTRIN XL) 150 MG 24 hr tablet Take 150 mg by mouth every morning.  calcipotriene (DOVONOX) 0.005 % cream Apply topically 2 (two) times daily.     fexofenadine-pseudoephedrine (ALLEGRA-D 24) 180-240 MG 24 hr tablet Take 1 tablet by mouth daily.      fluticasone (FLONASE) 50 MCG/ACT nasal spray USE 2 SPRAYS IN EACH NOSTRIL DAILY 48 g 3   fluticasone (FLOVENT HFA) 110 MCG/ACT inhaler Inhale 2 puffs into the lungs 2 (two) times daily. With spacer and rinse mouth afterewards 3 Inhaler 1   hydrocortisone 2.5 % cream Apply topically.     lisinopril (ZESTRIL) 40 MG tablet TAKE 1 TABLET DAILY 90 tablet 3   nitroGLYCERIN (NITROSTAT) 0.4 MG SL tablet DISSOLVE 1 TABLET UNDER THE TONGUE EVERY 5 MINUTES AS NEEDED FOR CHEST PAIN 25 tablet 11   olmesartan (BENICAR) 20 MG tablet TAKE 1 TABLET DAILY 90 tablet 1   omeprazole (PRILOSEC) 20 MG capsule TAKE 2 CAPSULES TWICE A DAY BEFORE MEALS 360 capsule 3   sildenafil (VIAGRA) 100 MG tablet TAKE 1 TABLET DAILY AS NEEDED FOR ERECTILE DYSFUNCTION (NOT TO USE MEDICATION WITH NITROGLYCERIN) 60 tablet 3   triamcinolone cream (KENALOG) 0.1 % Apply topically.     No current facility-administered medications for this visit.    LABS/IMAGING: No results found for this or any previous visit (from the past 48 hour(s)). No results found.  VITALS: BP 113/63    Pulse 69    Ht 6\' 2"  (1.88 m)    Wt 219 lb 9.6 oz (99.6 kg)    SpO2 99%    BMI 28.19 kg/m   EXAM: General appearance: alert and no distress Neck: no carotid bruit and no JVD Lungs: clear to auscultation bilaterally Heart: regular rate and rhythm, S1, S2 normal, no murmur, click, rub or gallop Abdomen: soft, non-tender; bowel sounds normal; no masses,  no organomegaly Extremities: extremities normal, atraumatic, no cyanosis or edema Pulses: 2+ and symmetric Skin: Skin color, texture, turgor normal. No rashes or lesions Neurologic: Grossly normal Psych: Pleasant  EKG: Normal sinus  rhythm 69, left axis deviation-personally reviewed-personally reviewed  ASSESSMENT: 1. Coronary artery disease status post PCI in 1997 (PS Stent to proximal LAD) and 2015 (Promus premier DES to RCA) 2. Hepatitis C-now cured after treatment with Harvoni 3. Dyslipidemia 4. Hypertension 5. Possible rotator cuff tendinitis  PLAN: 1.   Mr. Donaway continues to do well without any chest pain or worsening shortness of breath after his last PCI in 2015.  Cholesterol has been well controlled and is due to be reassessed but he has a follow-up with his PCP around December.  Blood pressure is excellent.  He was having some issues with his shoulder that have improved.  Overall no concerns today.  Plan follow-up with me annually or sooner as necessary.  Pixie Casino, MD, [facility], [place] Director of the [facility]  [facility] Diplomate of the American Board of Clinical Lipidology Attending Cardiologist  Direct Dial: [phone]   Fax: [phone]  Website:  [email or web address] 03/04/2020, 3:45 PM

## 2020-03-05 ENCOUNTER — Encounter: Payer: Self-pay | Admitting: Internal Medicine

## 2020-03-18 ENCOUNTER — Encounter: Payer: Self-pay | Admitting: Emergency Medicine

## 2020-03-25 NOTE — Telephone Encounter (Signed)
Pt comment on pneumonia vaccine  Pt had: Pneumococcal 13 10/22/2006  Pneumococcal 23 04/04/2013, 06/07/2018  Should they have another dose? Please advise.

## 2020-03-26 NOTE — Telephone Encounter (Signed)
No, he was adequately vaccinated. Thanks.

## 2020-05-21 ENCOUNTER — Other Ambulatory Visit: Payer: Self-pay | Admitting: Internal Medicine

## 2020-06-04 ENCOUNTER — Ambulatory Visit: Payer: Medicare Other | Admitting: Emergency Medicine

## 2020-06-05 ENCOUNTER — Ambulatory Visit (INDEPENDENT_AMBULATORY_CARE_PROVIDER_SITE_OTHER): Payer: Medicare Other | Admitting: Emergency Medicine

## 2020-06-05 ENCOUNTER — Encounter: Payer: Self-pay | Admitting: Emergency Medicine

## 2020-06-05 ENCOUNTER — Other Ambulatory Visit: Payer: Self-pay

## 2020-06-05 VITALS — BP 134/76 | HR 64 | Temp 97.7°F | Resp 16 | Ht 74.0 in | Wt 219.0 lb

## 2020-06-05 DIAGNOSIS — I10 Essential (primary) hypertension: Secondary | ICD-10-CM | POA: Diagnosis not present

## 2020-06-05 DIAGNOSIS — E785 Hyperlipidemia, unspecified: Secondary | ICD-10-CM

## 2020-06-05 DIAGNOSIS — Z8679 Personal history of other diseases of the circulatory system: Secondary | ICD-10-CM

## 2020-06-05 DIAGNOSIS — Z8659 Personal history of other mental and behavioral disorders: Secondary | ICD-10-CM

## 2020-06-05 DIAGNOSIS — I2510 Atherosclerotic heart disease of native coronary artery without angina pectoris: Secondary | ICD-10-CM
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
## 2020-06-05 NOTE — Patient Instructions (Addendum)
Stop lisinopril. Continue Benicar 20 mg daily.    If you have lab work done today you will be contacted with your lab results within the next 2 weeks.  If you have not heard from [place] then please contact us. The fastest way to get your results is to register for My Chart.   IF you received an x-ray today, you will receive an invoice from [facility]. Please contact [facility] at [phone] with questions or concerns regarding your invoice.   IF you received labwork today, you will receive an invoice from [facility]. Please contact [facility] at [phone] with questions or concerns regarding your invoice.   Our billing staff will not be able to assist you with questions regarding bills from these companies.  You will be contacted with the lab results as soon as they are available. The fastest way to get your results is to activate your My Chart account. Instructions are located on the last page of this paperwork. If you have not heard from [place] regarding the results in 2 weeks, please contact this office.     Hypertension, Adult High blood pressure (hypertension) is when the force of blood pumping through the arteries is too strong. The arteries are the blood vessels that carry blood from the heart throughout the body. Hypertension forces the heart to work harder to pump blood and may cause arteries to become narrow or stiff. Untreated or uncontrolled hypertension can cause a heart attack, heart failure, a stroke, kidney disease, and other problems. A blood pressure reading consists of a higher number over a lower number. Ideally, your blood pressure should be below 120/80. The first ("top") number is called the systolic pressure. It is a measure of the pressure in your arteries as your heart beats. The second ("bottom") number is called the diastolic pressure. It is a measure of the pressure in your arteries as the heart relaxes. What are the causes? The exact cause of this  condition is not known. There are some conditions that result in or are related to high blood pressure. What increases the risk? Some risk factors for high blood pressure are under your control. The following factors may make you more likely to develop this condition:  Smoking.  Having type 2 diabetes mellitus, high cholesterol, or both.  Not getting enough exercise or physical activity.  Being overweight.  Having too much fat, sugar, calories, or salt (sodium) in your diet.  Drinking too much alcohol. Some risk factors for high blood pressure may be difficult or impossible to change. Some of these factors include:  Having chronic kidney disease.  Having a family history of high blood pressure.  Age. Risk increases with age.  Race. You may be at higher risk if you are African American.  Gender. Men are at higher risk than women before age 53. After age 62, women are at higher risk than men.  Having obstructive sleep apnea.  Stress. What are the signs or symptoms? High blood pressure may not cause symptoms. Very high blood pressure (hypertensive crisis) may cause:  Headache.  Anxiety.  Shortness of breath.  Nosebleed.  Nausea and vomiting.  Vision changes.  Severe chest pain.  Seizures. How is this diagnosed? This condition is diagnosed by measuring your blood pressure while you are seated, with your arm resting on a flat surface, your legs uncrossed, and your feet flat on the floor. The cuff of the blood pressure monitor will be placed directly against the skin of your upper  arm at the level of your heart. It should be measured at least twice using the same arm. Certain conditions can cause a difference in blood pressure between your right and left arms. Certain factors can cause blood pressure readings to be lower or higher than normal for a short period of time:  When your blood pressure is higher when you are in a health care provider's office than when you are  at home, this is called white coat hypertension. Most people with this condition do not need medicines.  When your blood pressure is higher at home than when you are in a health care provider's office, this is called masked hypertension. Most people with this condition may need medicines to control blood pressure. If you have a high blood pressure reading during one visit or you have normal blood pressure with other risk factors, you may be asked to:  Return on a different day to have your blood pressure checked again.  Monitor your blood pressure at home for 1 week or longer. If you are diagnosed with hypertension, you may have other blood or imaging tests to help your health care provider understand your overall risk for other conditions. How is this treated? This condition is treated by making healthy lifestyle changes, such as eating healthy foods, exercising more, and reducing your alcohol intake. Your health care provider may prescribe medicine if lifestyle changes are not enough to get your blood pressure under control, and if:  Your systolic blood pressure is above 130.  Your diastolic blood pressure is above 80. Your personal target blood pressure may vary depending on your medical conditions, your age, and other factors. Follow these instructions at home: Eating and drinking  Eat a diet that is high in fiber and potassium, and low in sodium, added sugar, and fat. An example eating plan is called the DASH (Dietary Approaches to Stop Hypertension) diet. To eat this way: ? Eat plenty of fresh fruits and vegetables. Try to fill one half of your plate at each meal with fruits and vegetables. ? Eat whole grains, such as whole-wheat pasta, brown rice, or whole-grain bread. Fill about one fourth of your plate with whole grains. ? Eat or drink low-fat dairy products, such as skim milk or low-fat yogurt. ? Avoid fatty cuts of meat, processed or cured meats, and poultry with skin. Fill about  one fourth of your plate with lean proteins, such as fish, chicken without skin, beans, eggs, or tofu. ? Avoid pre-made and processed foods. These tend to be higher in sodium, added sugar, and fat.  Reduce your daily sodium intake. Most people with hypertension should eat less than 1,500 mg of sodium a day.  Do not drink alcohol if: ? Your health care provider tells you not to drink. ? You are pregnant, may be pregnant, or are planning to become pregnant.  If you drink alcohol: ? Limit how much you use to:  0-1 drink a day for women.  0-2 drinks a day for men. ? Be aware of how much alcohol is in your drink. In the [place] one drink equals one 12 oz bottle of beer (355 mL), one 5 oz glass of wine (148 mL), or one 1 oz glass of hard liquor (44 mL).   Lifestyle  Work with your health care provider to maintain a healthy body weight or to lose weight. Ask what an ideal weight is for you.  Get at least 30 minutes of exercise most days of the  week. Activities may include walking, swimming, or biking.  Include exercise to strengthen your muscles (resistance exercise), such as Pilates or lifting weights, as part of your weekly exercise routine. Try to do these types of exercises for 30 minutes at least 3 days a week.  Do not use any products that contain nicotine or tobacco, such as cigarettes, e-cigarettes, and chewing tobacco. If you need help quitting, ask your health care provider.  Monitor your blood pressure at home as told by your health care provider.  Keep all follow-up visits as told by your health care provider. This is important.   Medicines  Take over-the-counter and prescription medicines only as told by your health care provider. Follow directions carefully. Blood pressure medicines must be taken as prescribed.  Do not skip doses of blood pressure medicine. Doing this puts you at risk for problems and can make the medicine less effective.  Ask your health care provider about  side effects or reactions to medicines that you should watch for. Contact a health care provider if you:  Think you are having a reaction to a medicine you are taking.  Have headaches that keep coming back (recurring).  Feel dizzy.  Have swelling in your ankles.  Have trouble with your vision. Get help right away if you:  Develop a severe headache or confusion.  Have unusual weakness or numbness.  Feel faint.  Have severe pain in your chest or abdomen.  Vomit repeatedly.  Have trouble breathing. Summary  Hypertension is when the force of blood pumping through your arteries is too strong. If this condition is not controlled, it may put you at risk for serious complications.  Your personal target blood pressure may vary depending on your medical conditions, your age, and other factors. For most people, a normal blood pressure is less than 120/80.  Hypertension is treated with lifestyle changes, medicines, or a combination of both. Lifestyle changes include losing weight, eating a healthy, low-sodium diet, exercising more, and limiting alcohol. This information is not intended to replace advice given to you by your health care provider. Make sure you discuss any questions you have with your health care provider. Document Revised: 01/12/2018 Document Reviewed: 01/12/2018 Elsevier Patient Education  2021 [employer].

## 2020-06-05 NOTE — Assessment & Plan Note (Signed)
Well-controlled hypertension.  Continue amlodipine and olmesartan.  Question of whether he was taking lisinopril or not.  Advised to stop lisinopril if he was taking it. Diet and nutrition discussed.  CMP done today to check for kidney and liver function. Follow-up in 3 months.

## 2020-06-06 LAB — COMPREHENSIVE METABOLIC PANEL
ALT: 17 IU/L (ref 0–44)
AST: 22 IU/L (ref 0–40)
Albumin/Globulin Ratio: 1.3 (ref 1.2–2.2)
Albumin: 4.2 g/dL (ref 3.8–4.8)
Alkaline Phosphatase: 109 IU/L (ref 44–121)
BUN/Creatinine Ratio: 8 — ABNORMAL LOW (ref 10–24)
BUN: 10 mg/dL (ref 8–27)
Bilirubin Total: 0.4 mg/dL (ref 0.0–1.2)
CO2: 22 mmol/L (ref 20–29)
Calcium: 9.7 mg/dL (ref 8.6–10.2)
Chloride: 107 mmol/L — ABNORMAL HIGH (ref 96–106)
Creatinine, Ser: 1.22 mg/dL (ref 0.76–1.27)
GFR calc Af Amer: 69 mL/min/{1.73_m2} (ref >59)
GFR calc non Af Amer: 60 mL/min/{1.73_m2} (ref >59)
Globulin, Total: 3.3 g/dL (ref 1.5–4.5)
Glucose: 105 mg/dL — ABNORMAL HIGH (ref 65–99)
Potassium: 4.6 mmol/L (ref 3.5–5.2)
Sodium: 142 mmol/L (ref 134–144)
Total Protein: 7.5 g/dL (ref 6.0–8.5)

## 2020-06-18 IMAGING — MR MR HEAD W/O CM
10 of 11 series · 43 of 48 positions shown · non-contrast
Comparison: None.

CLINICAL DATA: Stroke suspected.  Generalized weakness.

EXAM:
MRI HEAD WITHOUT CONTRAST
TECHNIQUE: Multiplanar, multiecho pulse sequences of the brain and surrounding
structures were obtained without intravenous contrast.

[Series 5: ax dwi_tracew · axial · 3.0mm · 1.50mm/px · z∈[-28,+119]mm · 8 of 100 slices shown]
[im 1/100]
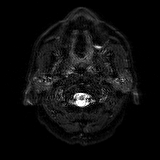
[im 15/100]
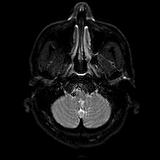
[im 29/100]
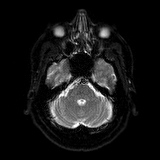
[im 43/100]
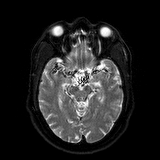
[im 57/100]
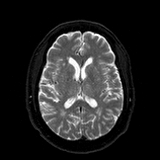
[im 71/100]
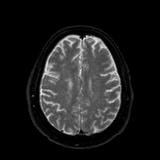
[im 85/100]
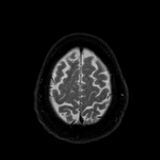
[im 100/100]
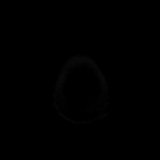

[Series 6: ax dwi_adc · axial · 3.0mm · 1.50mm/px · z∈[-28,+119]mm · 5 of 50 slices shown]
[im 1/50]
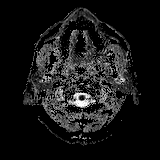
[im 13/50]
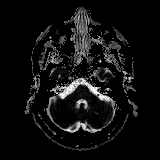
[im 25/50]
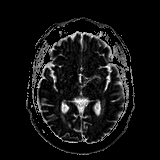
[im 37/50]
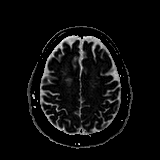
[im 50/50]
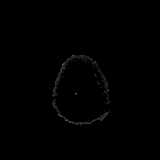

[Series 7: cor dwi_tracew · coronal · 5.0mm · 1.44mm/px · 6 of 64 slices shown]
[im 1/64]
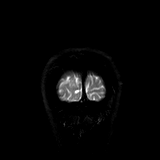
[im 13/64]
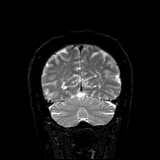
[im 26/64]
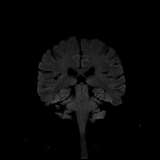
[im 38/64]
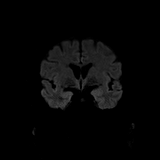
[im 51/64]
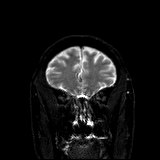
[im 64/64]
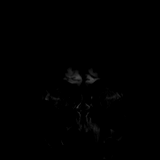

[Series 8: cor dwi_adc · coronal · 5.0mm · 1.44mm/px · 3 of 32 slices shown]
[im 1/32]
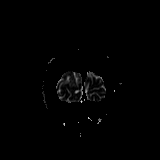
[im 16/32]
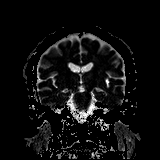
[im 32/32]
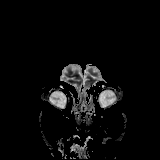

[Series 9: T1 · sagittal · 5.0mm · 0.75mm/px · 2 of 23 slices shown]
[im 1/23]
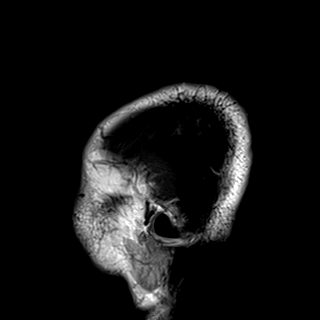
[im 23/23]
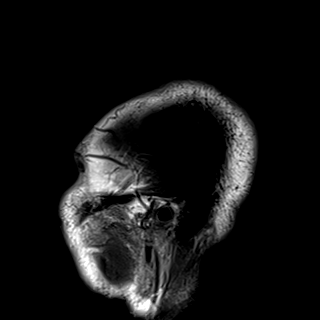

[Series 10: T2 · axial · 5.0mm · 0.72mm/px · z∈[-33,+129]mm · 3 of 28 slices shown (1 of 2)]
[im 1/28]
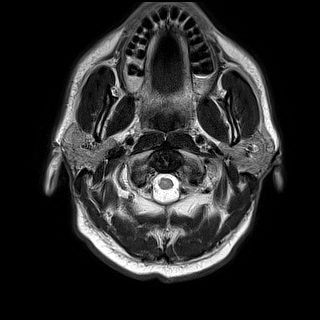
[im 14/28]
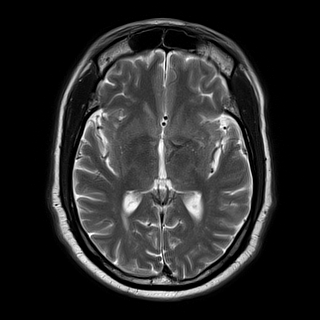
[im 28/28]
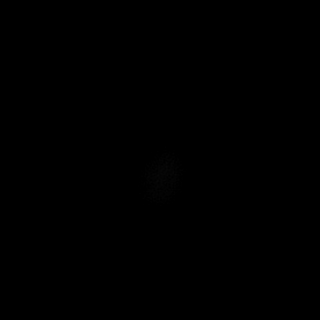

[Series 11: FLAIR · axial · 5.0mm · 0.45mm/px · z∈[-33,+129]mm · 3 of 28 slices shown]
[im 1/28]
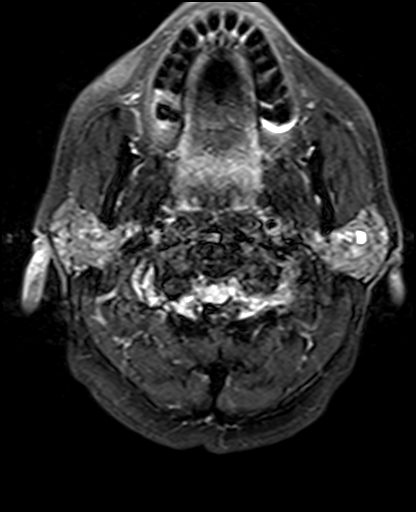
[im 14/28]
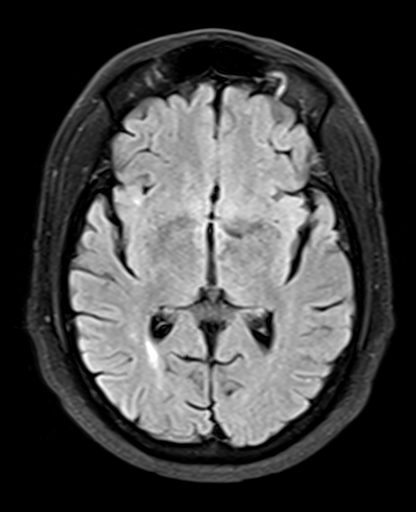
[im 28/28]
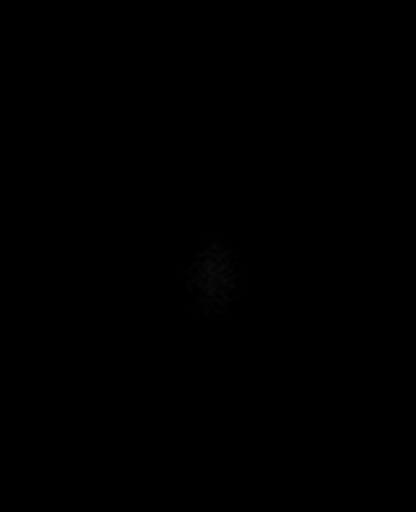

[Series 12: swi_images · axial · 3.0mm · 0.90mm/px · z∈[-37,+140]mm · 5 of 60 slices shown]
[im 1/60]
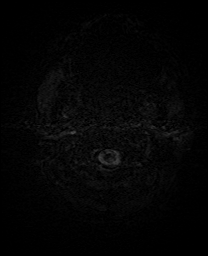
[im 15/60]
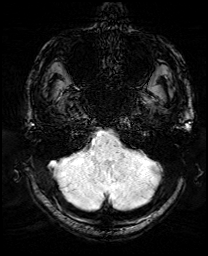
[im 30/60]
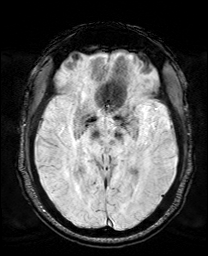
[im 45/60]
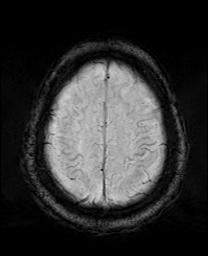
[im 60/60]
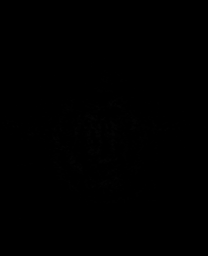

[Series 13: mip_images(sw) · axial · 24.0mm · 0.90mm/px · z∈[-26,+130]mm · 5 of 53 slices shown]
[im 1/53]
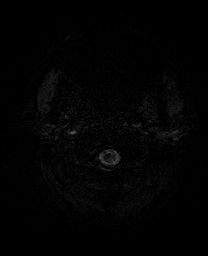
[im 14/53]
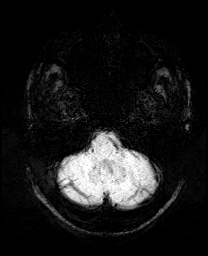
[im 27/53]
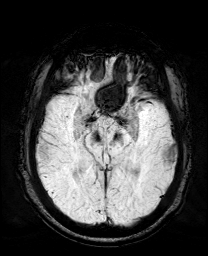
[im 40/53]
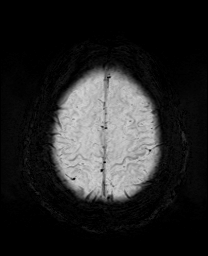
[im 53/53]
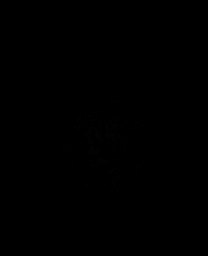

[Series 15: T2 · coronal · 5.0mm · 0.34mm/px · 3 of 29 slices shown (2 of 2)]
[im 1/29]
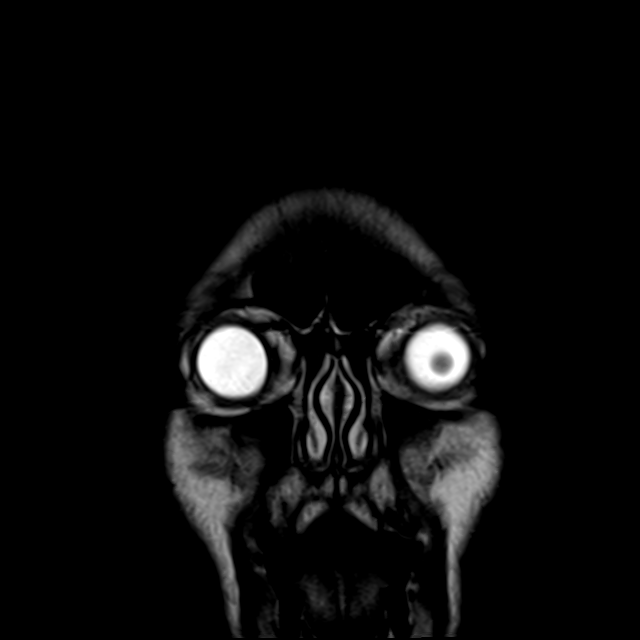
[im 15/29]
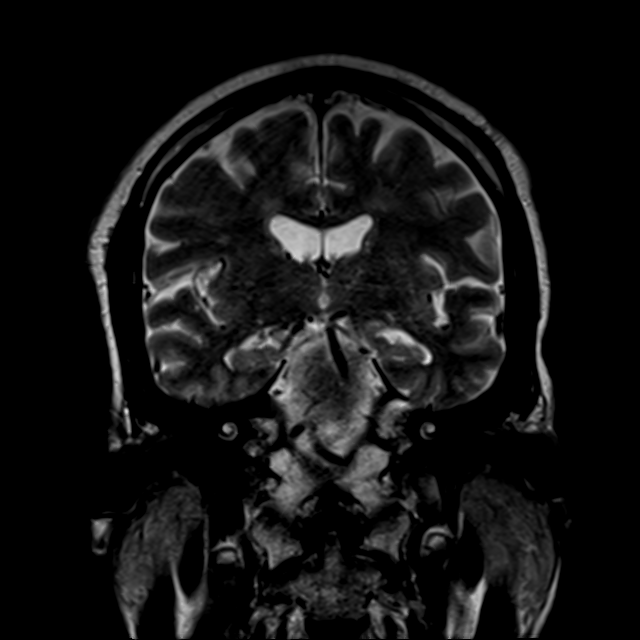
[im 29/29]
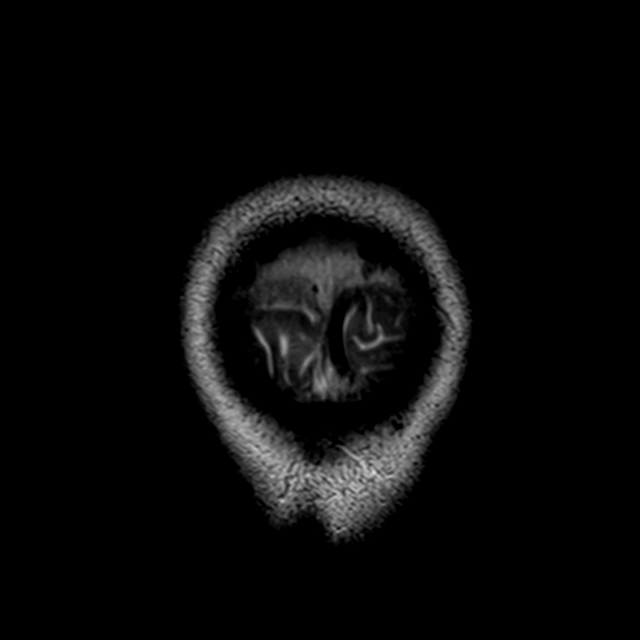

[43 of 48 positions shown; findings below may reference images not displayed]

FINDINGS: Brain: No acute infarction, hemorrhage, hydrocephalus, extra-axial
collection or mass lesion. Mild chronic small vessel ischemia in the
cerebral white matter. Small remote infarct in the left cerebellum.

Vascular: Major flow voids are preserved

Skull and upper cervical spine: No evidence of marrow lesion

Sinuses/Orbits: Negative

Other: Incidental left parietal scalp scarring.
IMPRESSION: 1. No acute finding.
2. Overall mild chronic small vessel ischemia.

## 2020-07-01 ENCOUNTER — Other Ambulatory Visit: Payer: Self-pay | Admitting: Internal Medicine

## 2020-08-01 ENCOUNTER — Other Ambulatory Visit: Payer: Self-pay | Admitting: Allergy & Immunology

## 2020-08-06 ENCOUNTER — Other Ambulatory Visit: Payer: Self-pay | Admitting: Allergy & Immunology

## 2020-08-20 ENCOUNTER — Ambulatory Visit: Payer: Medicare Other | Admitting: Allergy & Immunology

## 2020-08-23 ENCOUNTER — Other Ambulatory Visit: Payer: Self-pay

## 2020-08-26 ENCOUNTER — Other Ambulatory Visit: Payer: Self-pay

## 2020-08-26 ENCOUNTER — Encounter: Payer: Self-pay | Admitting: Emergency Medicine

## 2020-08-26 ENCOUNTER — Ambulatory Visit (INDEPENDENT_AMBULATORY_CARE_PROVIDER_SITE_OTHER): Payer: Medicare Other | Admitting: Emergency Medicine

## 2020-08-26 VITALS — BP 138/80 | HR 81 | Temp 98.4°F | Ht 74.0 in | Wt 230.0 lb

## 2020-08-26 DIAGNOSIS — I10 Essential (primary) hypertension: Secondary | ICD-10-CM

## 2020-08-26 DIAGNOSIS — I2510 Atherosclerotic heart disease of native coronary artery without angina pectoris: Secondary | ICD-10-CM | POA: Diagnosis not present

## 2020-08-26 DIAGNOSIS — E785 Hyperlipidemia, unspecified: Secondary | ICD-10-CM | POA: Diagnosis not present

## 2020-08-26 DIAGNOSIS — M5030 Other cervical disc degeneration, unspecified cervical region: Secondary | ICD-10-CM

## 2020-08-26 NOTE — Patient Instructions (Signed)
Health Maintenance After Age 71 After age 71, you are at a higher risk for certain long-term diseases and infections as well as injuries from falls. Falls are a major cause of broken bones and head injuries in people who are older than age 71. Getting regular preventive care can help to keep you healthy and well. Preventive care includes getting regular testing and making lifestyle changes as recommended by your health care provider. Talk with your health care provider about:  Which screenings and tests you should have. A screening is a test that checks for a disease when you have no symptoms.  A diet and exercise plan that is right for you. What should I know about screenings and tests to prevent falls? Screening and testing are the best ways to find a health problem early. Early diagnosis and treatment give you the best chance of managing medical conditions that are common after age 71. Certain conditions and lifestyle choices may make you more likely to have a fall. Your health care provider may recommend:  Regular vision checks. Poor vision and conditions such as cataracts can make you more likely to have a fall. If you wear glasses, make sure to get your prescription updated if your vision changes.  Medicine review. Work with your health care provider to regularly review all of the medicines you are taking, including over-the-counter medicines. Ask your health care provider about any side effects that may make you more likely to have a fall. Tell your health care provider if any medicines that you take make you feel dizzy or sleepy.  Osteoporosis screening. Osteoporosis is a condition that causes the bones to get weaker. This can make the bones weak and cause them to break more easily.  Blood pressure screening. Blood pressure changes and medicines to control blood pressure can make you feel dizzy.  Strength and balance checks. Your health care provider may recommend certain tests to check your  strength and balance while standing, walking, or changing positions.  Foot health exam. Foot pain and numbness, as well as not wearing proper footwear, can make you more likely to have a fall.  Depression screening. You may be more likely to have a fall if you have a fear of falling, feel emotionally low, or feel unable to do activities that you used to do.  Alcohol use screening. Using too much alcohol can affect your balance and may make you more likely to have a fall. What actions can I take to lower my risk of falls? General instructions  Talk with your health care provider about your risks for falling. Tell your health care provider if: ? You fall. Be sure to tell your health care provider about all falls, even ones that seem minor. ? You feel dizzy, sleepy, or off-balance.  Take over-the-counter and prescription medicines only as told by your health care provider. These include any supplements.  Eat a healthy diet and maintain a healthy weight. A healthy diet includes low-fat dairy products, low-fat (lean) meats, and fiber from whole grains, beans, and lots of fruits and vegetables. Home safety  Remove any tripping hazards, such as rugs, cords, and clutter.  Install safety equipment such as grab bars in bathrooms and safety rails on stairs.  Keep rooms and walkways well-lit. Activity  Follow a regular exercise program to stay fit. This will help you maintain your balance. Ask your health care provider what types of exercise are appropriate for you.  If you need a cane or walker,   use it as recommended by your health care provider.  Wear supportive shoes that have nonskid soles.   Lifestyle  Do not drink alcohol if your health care provider tells you not to drink.  If you drink alcohol, limit how much you have: ? 0-1 drink a day for women. ? 0-2 drinks a day for men.  Be aware of how much alcohol is in your drink. In the [place] one drink equals one typical bottle of beer (12  oz), one-half glass of wine (5 oz), or one shot of hard liquor (1 oz).  Do not use any products that contain nicotine or tobacco, such as cigarettes and e-cigarettes. If you need help quitting, ask your health care provider. Summary  Having a healthy lifestyle and getting preventive care can help to protect your health and wellness after age 71.  Screening and testing are the best way to find a health problem early and help you avoid having a fall. Early diagnosis and treatment give you the best chance for managing medical conditions that are more common for people who are older than age 71.  Falls are a major cause of broken bones and head injuries in people who are older than age 71. Take precautions to prevent a fall at home.  Work with your health care provider to learn what changes you can make to improve your health and wellness and to prevent falls. This information is not intended to replace advice given to you by your health care provider. Make sure you discuss any questions you have with your health care provider. Document Revised: 08/25/2018 Document Reviewed: 03/17/2017 Elsevier Patient Education  2021 [employer]  
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
## 2020-09-06 ENCOUNTER — Other Ambulatory Visit: Payer: Self-pay | Admitting: Emergency Medicine

## 2020-09-06 DIAGNOSIS — I10 Essential (primary) hypertension: Secondary | ICD-10-CM

## 2020-10-01 ENCOUNTER — Ambulatory Visit (INDEPENDENT_AMBULATORY_CARE_PROVIDER_SITE_OTHER): Payer: Medicare Other | Admitting: Allergy & Immunology

## 2020-10-01 ENCOUNTER — Encounter: Payer: Self-pay | Admitting: Allergy & Immunology

## 2020-10-01 ENCOUNTER — Other Ambulatory Visit: Payer: Self-pay

## 2020-10-01 VITALS — BP 132/80 | HR 63 | Temp 97.0°F | Ht 74.0 in | Wt 221.4 lb

## 2020-10-01 DIAGNOSIS — J4530 Mild persistent asthma, uncomplicated: Secondary | ICD-10-CM

## 2020-10-01 DIAGNOSIS — I2510 Atherosclerotic heart disease of native coronary artery without angina pectoris: Secondary | ICD-10-CM

## 2020-10-01 DIAGNOSIS — J302 Other seasonal allergic rhinitis: Secondary | ICD-10-CM

## 2020-10-01 DIAGNOSIS — J3089 Other allergic rhinitis: Secondary | ICD-10-CM | POA: Diagnosis not present

## 2020-10-01 NOTE — Progress Notes (Signed)
FOLLOW UP  Date of Service/Encounter:  10/01/20   Assessment:   Mild intermittent asthma, uncomplicated  Seasonal and perennial allergic rhinitis(grasses, ragweed, trees, indoor molds, outdoor molds, cat and dog)  Fully vaccinated to [place]   Plan/Recommendations:   1. Mild persistent asthma, uncomplicated - Lung testing looks great today. - We are not going to make any medication changes at this time.  - We can change to a stronger asthma medication if these night time coughing episodes become more frequent.  - Daily controller medication(s): Flovent 117mcg 2 puffs twice daily with spacer - Prior to physical activity: albuterol 2 puffs 10-15 minutes before physical activity. - Rescue medications: albuterol 4 puffs every 4-6 hours as needed - Changes during respiratory infections or worsening symptoms: Increase Flovent 143mcg to 4 puffs twice daily for TWO WEEKS. - Asthma control goals:  * Full participation in all desired activities (may need albuterol before activity) * Albuterol use two time or less a week on average (not counting use with activity) * Cough interfering with sleep two time or less a month * Oral steroids no more than once a year * No hospitalizations  2. Seasonal and perennial allergic rhinitis (grasses, ragweed, trees, indoor molds, outdoor molds, cat and dog) - Continue with: Allegra (fexofenadine) 180mg  tablet once daily and Flonase (fluticasone) two sprays per nostril daily and Astelin (azelastine) 2 sprays per nostril 1-2 times daily. - Continue with salt water rinses before the nose sprays.  3. Return in about 6 months (around 04/03/2021).    Subjective:   Adam Peters is a 70 y.o. male presenting today for follow up of  Chief Complaint  Patient presents with  . Allergic Rhinitis     Last night had constant coughing not sure what caused it   . Asthma    ACT- 22 Uses his rescue inhaler when working out or sometimes for  wheezing     Adam Peters has a history of the following: Patient Active Problem List   Diagnosis Date Noted  . Ex-smoker 04/04/2019  . Essential hypertension   . Hyperlipidemia   . History of tobacco use 07/08/2012  . Glaucoma 04/08/2012  . DDD (degenerative disc disease), cervical   . CAD- LAD BMS '97, RCA DES 08/02/13 08/17/2011    History obtained from: chart review and patient.  Adam Peters is a 70 y.o. male presenting for a follow up visit.  He was last seen in October 2021.  At that time, we did not do lung testing.  We continue with Flovent 210 mcg 2 puffs twice daily as well as albuterol as needed.  For his allergic rhinitis, we continued with Allegra once daily as well as Flonase and Astelin.  Since last visit, he has done well. He did do the train to [place] and back. This leaves around 8am or so and arrives around 2pm or so.   Asthma/Respiratory Symptom History: Asthma is the same. He did wake up last night with some issues. He started coughing. He was coughing at night as well. This is very random. It might occur once every 2 weeks. This is not unbearable. He has tried using his albuterol to see if this helps. Going to the gym is a big thing for him. He is not interested in trying something stronger.    Allergic Rhinitis Symptom History: Allergies are under fairly good control. He does have an online system from [employer] which takes care of it. He has not  needed antibiotics for any sinus infections. He did have problems years back.   Otherwise, there have been no changes to his past medical history, surgical history, family history, or social history.    Review of Systems  Constitutional: Negative.  Negative for fever, malaise/fatigue and weight loss.  HENT: Negative.  Negative for congestion, ear discharge, ear pain and sore throat.   Eyes: Negative for pain, discharge and redness.  Respiratory: Positive for cough. Negative for sputum production, shortness of breath  and wheezing.   Cardiovascular: Negative.  Negative for chest pain and palpitations.  Gastrointestinal: Negative for abdominal pain, heartburn, nausea and vomiting.  Skin: Negative.  Negative for itching and rash.  Neurological: Negative for dizziness and headaches.  Endo/Heme/Allergies: Negative for environmental allergies. Does not bruise/bleed easily.       Objective:   Blood pressure 132/80, pulse 63, temperature (!) 97 F (36.1 C), height 6\' 2"  (1.88 m), weight 221 lb 6.4 oz (100.4 kg), SpO2 98 %. Body mass index is 28.43 kg/m.   Physical Exam:  Physical Exam Constitutional:      Appearance: He is well-developed.  HENT:     Head: Normocephalic and atraumatic.     Right Ear: Tympanic membrane, ear canal and external ear normal.     Left Ear: Tympanic membrane, ear canal and external ear normal.     Nose: No nasal deformity, septal deviation, mucosal edema or rhinorrhea.     Right Turbinates: Enlarged and swollen.     Left Turbinates: Enlarged and swollen.     Right Sinus: No maxillary sinus tenderness or frontal sinus tenderness.     Left Sinus: No maxillary sinus tenderness or frontal sinus tenderness.     Comments: No nasal polyps noted.    Mouth/Throat:     Mouth: Mucous membranes are not pale and not dry.     Pharynx: Uvula midline.  Eyes:     General: Lids are normal. Allergic shiner present.        Right eye: No discharge.        Left eye: No discharge.     Conjunctiva/sclera: Conjunctivae normal.     Right eye: Right conjunctiva is not injected. No chemosis.    Left eye: Left conjunctiva is not injected. No chemosis.    Pupils: Pupils are equal, round, and reactive to light.  Cardiovascular:     Rate and Rhythm: Normal rate and regular rhythm.     Heart sounds: Normal heart sounds.  Pulmonary:     Effort: Pulmonary effort is normal. No tachypnea, accessory muscle usage or respiratory distress.     Breath sounds: Normal breath sounds. No wheezing, rhonchi or  rales.     Comments: Moving air well in all lung fields. No increased work of breathing noted.  Chest:     Chest wall: No tenderness.  Lymphadenopathy:     Cervical: No cervical adenopathy.  Skin:    General: Skin is warm.     Capillary Refill: Capillary refill takes less than 2 seconds.     Coloration: Skin is not pale.     Findings: No abrasion, erythema, petechiae or rash. Rash is not papular, urticarial or vesicular.     Comments: No eczematous or urticarial lesions noted.  Neurological:     Mental Status: He is alert.  Psychiatric:        Behavior: Behavior is cooperative.      Diagnostic studies:    Spirometry: results normal (FEV1: 2.79/89%, FVC: 3.98/96%, FEV1/FVC: 70%).  Spirometry consistent with normal pattern.   Allergy Studies: none      Salvatore Marvel, MD  [facility] [place] [facility] [place]

## 2020-10-01 NOTE — Patient Instructions (Addendum)
1. Mild persistent asthma, uncomplicated - Lung testing looks great today. - We are not going to make any medication changes at this time.  - We can change to a stronger asthma medication if these night time coughing episodes become more frequent.  - Daily controller medication(s): Flovent 17mcg 2 puffs twice daily with spacer - Prior to physical activity: albuterol 2 puffs 10-15 minutes before physical activity. - Rescue medications: albuterol 4 puffs every 4-6 hours as needed - Changes during respiratory infections or worsening symptoms: Increase Flovent 159mcg to 4 puffs twice daily for TWO WEEKS. - Asthma control goals:  * Full participation in all desired activities (may need albuterol before activity) * Albuterol use two time or less a week on average (not counting use with activity) * Cough interfering with sleep two time or less a month * Oral steroids no more than once a year * No hospitalizations  2. Seasonal and perennial allergic rhinitis (grasses, ragweed, trees, indoor molds, outdoor molds, cat and dog) - Continue with: Allegra (fexofenadine) 180mg  tablet once daily and Flonase (fluticasone) two sprays per nostril daily and Astelin (azelastine) 2 sprays per nostril 1-2 times daily. - Continue with salt water rinses before the nose sprays.  3. Return in about 6 months (around 04/03/2021).    Please inform us of any Emergency Department visits, hospitalizations, or changes in symptoms. Call us before going to the ED for breathing or allergy symptoms since we might be able to fit you in for a sick visit. Feel free to contact us anytime with any questions, problems, or concerns.  It was a pleasure to see you again today!  Websites that have reliable patient information: 1. American Academy of Asthma, Allergy, and Immunology: www.aaaai.org 2. Food Allergy Research and Education (FARE): foodallergy.org 3. Mothers of Asthmatics: http://www.asthmacommunitynetwork.org 4. American  College of Allergy, Asthma, and Immunology: www.acaai.org   COVID-19 Vaccine Information can be found at: ShippingScam.co.uk For questions related to vaccine distribution or appointments, please email vaccine@[place] .com or call [phone].     "Like" [place] on Facebook and Instagram for our latest updates!       Make sure you are registered to vote! If you have moved or changed any of your contact information, you will need to get this updated before voting!  In some cases, you MAY be able to register to vote online: [email or web address]

## 2020-10-03 DIAGNOSIS — J45909 Unspecified asthma, uncomplicated: Secondary | ICD-10-CM | POA: Insufficient documentation

## 2020-10-04 ENCOUNTER — Other Ambulatory Visit: Payer: Self-pay

## 2020-10-04 ENCOUNTER — Ambulatory Visit (INDEPENDENT_AMBULATORY_CARE_PROVIDER_SITE_OTHER): Payer: Medicare Other

## 2020-10-04 VITALS — BP 128/80 | HR 71 | Temp 97.7°F | Ht 74.0 in | Wt 221.4 lb

## 2020-10-04 DIAGNOSIS — Z0000 Encounter for general adult medical examination without abnormal findings: Secondary | ICD-10-CM
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
## 2020-10-04 NOTE — Patient Instructions (Addendum)
Mr. Adam Peters , Thank you for taking time to come for your Medicare Wellness Visit. I appreciate your ongoing commitment to your health goals. Please review the following plan we discussed and let me know if I can assist you in the future.   Screening recommendations/referrals: Colonoscopy: 11/03/2016; due every 10 years Recommended yearly ophthalmology/optometry visit for glaucoma screening and checkup Recommended yearly dental visit for hygiene and checkup  Vaccinations: Influenza vaccine: 03/13/2020 Pneumococcal vaccine: 10/22/2006, 06/07/2018 Tdap vaccine: 12/07/2016; due every 10 years Shingles vaccine: done at [facility] Covid-19: 06/20/2019, 08/17/2019, 03/18/2020, 08/29/2020  Advanced directives: Advance directive discussed with you today. Even though you declined this today please call our office should you change your mind and we can give you the proper paperwork for you to fill out.  Conditions/risks identified: Yes; Reviewed health maintenance screenings with patient today and relevant education, vaccines, and/or referrals were provided. Please continue to do your personal lifestyle choices by: daily care of teeth and gums, regular physical activity (goal should be 5 days a week for 30 minutes), eat a healthy diet, avoid tobacco and drug use, limiting any alcohol intake, taking a low-dose aspirin (if not allergic or have been advised by your provider otherwise) and taking vitamins and minerals as recommended by your provider. Continue doing brain stimulating activities (puzzles, reading, adult coloring books, staying active) to keep memory sharp. Continue to eat heart healthy diet (full of fruits, vegetables, whole grains, lean protein, water--limit salt, fat, and sugar intake) and increase physical activity as tolerated.  Next appointment: Please schedule your next Medicare Wellness Visit with your Nurse Health Advisor in 1 year by calling [phone].  Preventive Care 71 Years and  Older, Male Preventive care refers to lifestyle choices and visits with your health care provider that can promote health and wellness. What does preventive care include?  A yearly physical exam. This is also called an annual well check.  Dental exams once or twice a year.  Routine eye exams. Ask your health care provider how often you should have your eyes checked.  Personal lifestyle choices, including:  Daily care of your teeth and gums.  Regular physical activity.  Eating a healthy diet.  Avoiding tobacco and drug use.  Limiting alcohol use.  Practicing safe sex.  Taking low doses of aspirin every day.  Taking vitamin and mineral supplements as recommended by your health care provider. What happens during an annual well check? The services and screenings done by your health care provider during your annual well check will depend on your age, overall health, lifestyle risk factors, and family history of disease. Counseling  Your health care provider may ask you questions about your:  Alcohol use.  Tobacco use.  Drug use.  Emotional well-being.  Home and relationship well-being.  Sexual activity.  Eating habits.  History of falls.  Memory and ability to understand (cognition).  Work and work Statistician. Screening  You may have the following tests or measurements:  Height, weight, and BMI.  Blood pressure.  Lipid and cholesterol levels. These may be checked every 5 years, or more frequently if you are over 55 years old.  Skin check.  Lung cancer screening. You may have this screening every year starting at age 44 if you have a 30-pack-year history of smoking and currently smoke or have quit within the past 15 years.  Fecal occult blood test (FOBT) of the stool. You may have this test every year starting at age 3.  Flexible sigmoidoscopy or colonoscopy. You  may have a sigmoidoscopy every 5 years or a colonoscopy every 10 years starting at age  61.  Prostate cancer screening. Recommendations will vary depending on your family history and other risks.  Hepatitis C blood test.  Hepatitis B blood test.  Sexually transmitted disease (STD) testing.  Diabetes screening. This is done by checking your blood sugar (glucose) after you have not eaten for a while (fasting). You may have this done every 1-3 years.  Abdominal aortic aneurysm (AAA) screening. You may need this if you are a current or former smoker.  Osteoporosis. You may be screened starting at age 69 if you are at high risk. Talk with your health care provider about your test results, treatment options, and if necessary, the need for more tests. Vaccines  Your health care provider may recommend certain vaccines, such as:  Influenza vaccine. This is recommended every year.  Tetanus, diphtheria, and acellular pertussis (Tdap, Td) vaccine. You may need a Td booster every 10 years.  Zoster vaccine. You may need this after age 32.  Pneumococcal 13-valent conjugate (PCV13) vaccine. One dose is recommended after age 32.  Pneumococcal polysaccharide (PPSV23) vaccine. One dose is recommended after age 28. Talk to your health care provider about which screenings and vaccines you need and how often you need them. This information is not intended to replace advice given to you by your health care provider. Make sure you discuss any questions you have with your health care provider. Document Released: 05/31/2015 Document Revised: 01/22/2016 Document Reviewed: 03/05/2015 Elsevier Interactive Patient Education  2017 [employer] Prevention in the Home Falls can cause injuries. They can happen to people of all ages. There are many things you can do to make your home safe and to help prevent falls. What can I do on the outside of my home?  Regularly fix the edges of walkways and driveways and fix any cracks.  Remove anything that might make you trip as you walk through a  door, such as a raised step or threshold.  Trim any bushes or trees on the path to your home.  Use bright outdoor lighting.  Clear any walking paths of anything that might make someone trip, such as rocks or tools.  Regularly check to see if handrails are loose or broken. Make sure that both sides of any steps have handrails.  Any raised decks and porches should have guardrails on the edges.  Have any leaves, snow, or ice cleared regularly.  Use sand or salt on walking paths during winter.  Clean up any spills in your garage right away. This includes oil or grease spills. What can I do in the bathroom?  Use night lights.  Install grab bars by the toilet and in the tub and shower. Do not use towel bars as grab bars.  Use non-skid mats or decals in the tub or shower.  If you need to sit down in the shower, use a plastic, non-slip stool.  Keep the floor dry. Clean up any water that spills on the floor as soon as it happens.  Remove soap buildup in the tub or shower regularly.  Attach bath mats securely with double-sided non-slip rug tape.  Do not have throw rugs and other things on the floor that can make you trip. What can I do in the bedroom?  Use night lights.  Make sure that you have a light by your bed that is easy to reach.  Do not use any sheets or  blankets that are too big for your bed. They should not hang down onto the floor.  Have a firm chair that has side arms. You can use this for support while you get dressed.  Do not have throw rugs and other things on the floor that can make you trip. What can I do in the kitchen?  Clean up any spills right away.  Avoid walking on wet floors.  Keep items that you use a lot in easy-to-reach places.  If you need to reach something above you, use a strong step stool that has a grab bar.  Keep electrical cords out of the way.  Do not use floor polish or wax that makes floors slippery. If you must use wax, use  non-skid floor wax.  Do not have throw rugs and other things on the floor that can make you trip. What can I do with my stairs?  Do not leave any items on the stairs.  Make sure that there are handrails on both sides of the stairs and use them. Fix handrails that are broken or loose. Make sure that handrails are as long as the stairways.  Check any carpeting to make sure that it is firmly attached to the stairs. Fix any carpet that is loose or worn.  Avoid having throw rugs at the top or bottom of the stairs. If you do have throw rugs, attach them to the floor with carpet tape.  Make sure that you have a light switch at the top of the stairs and the bottom of the stairs. If you do not have them, ask someone to add them for you. What else can I do to help prevent falls?  Wear shoes that:  Do not have high heels.  Have rubber bottoms.  Are comfortable and fit you well.  Are closed at the toe. Do not wear sandals.  If you use a stepladder:  Make sure that it is fully opened. Do not climb a closed stepladder.  Make sure that both sides of the stepladder are locked into place.  Ask someone to hold it for you, if possible.  Clearly mark and make sure that you can see:  Any grab bars or handrails.  First and last steps.  Where the edge of each step is.  Use tools that help you move around (mobility aids) if they are needed. These include:  Canes.  Walkers.  Scooters.  Crutches.  Turn on the lights when you go into a dark area. Replace any light bulbs as soon as they burn out.  Set up your furniture so you have a clear path. Avoid moving your furniture around.  If any of your floors are uneven, fix them.  If there are any pets around you, be aware of where they are.  Review your medicines with your doctor. Some medicines can make you feel dizzy. This can increase your chance of falling. Ask your doctor what other things that you can do to help prevent falls. This  information is not intended to replace advice given to you by your health care provider. Make sure you discuss any questions you have with your health care provider. Document Released: 02/28/2009 Document Revised: 10/10/2015 Document Reviewed: 06/08/2014 Elsevier Interactive Patient Education  2017 [employer].

## 2020-11-19 ENCOUNTER — Other Ambulatory Visit: Payer: Self-pay | Admitting: Internal Medicine

## 2020-12-27 ENCOUNTER — Encounter: Payer: Self-pay | Admitting: Emergency Medicine

## 2020-12-30 NOTE — Telephone Encounter (Signed)
Called and spoke with pt. Pt had an OV 06/05/20, in which the pt was advised by Dr. Mitchel Honour to stop lisinopril.

## 2021-01-06 ENCOUNTER — Encounter: Payer: Self-pay | Admitting: Emergency Medicine

## 2021-01-06 ENCOUNTER — Encounter: Payer: Self-pay | Admitting: Allergy & Immunology

## 2021-01-06 ENCOUNTER — Other Ambulatory Visit: Payer: Self-pay | Admitting: Allergy & Immunology

## 2021-01-06 DIAGNOSIS — J310 Chronic rhinitis: Secondary | ICD-10-CM

## 2021-01-08 NOTE — Telephone Encounter (Signed)
Sent pt a mychart message to schedule an OV.

## 2021-01-10 NOTE — Addendum Note (Signed)
Addended by: Valentina Shaggy on: 01/10/2021 06:09 AM   Modules accepted: Orders

## 2021-01-10 NOTE — Telephone Encounter (Signed)
Called pt and spoke with pt about setting up an OV. Pt has OV for 01/14/21.

## 2021-01-10 NOTE — Telephone Encounter (Signed)
ENT referral placed.  Salvatore Marvel, MD [facility]

## 2021-01-14 ENCOUNTER — Ambulatory Visit (INDEPENDENT_AMBULATORY_CARE_PROVIDER_SITE_OTHER): Payer: Medicare Other | Admitting: Emergency Medicine

## 2021-01-14 ENCOUNTER — Encounter: Payer: Self-pay | Admitting: Emergency Medicine

## 2021-01-14 ENCOUNTER — Other Ambulatory Visit: Payer: Self-pay

## 2021-01-14 VITALS — BP 138/70 | HR 93 | Temp 98.3°F | Ht 74.0 in | Wt 217.0 lb

## 2021-01-14 DIAGNOSIS — E785 Hyperlipidemia, unspecified: Secondary | ICD-10-CM | POA: Diagnosis not present

## 2021-01-14 DIAGNOSIS — G4730 Sleep apnea, unspecified: Secondary | ICD-10-CM

## 2021-01-14 DIAGNOSIS — I2510 Atherosclerotic heart disease of native coronary artery without angina pectoris: Secondary | ICD-10-CM | POA: Diagnosis not present

## 2021-01-14 DIAGNOSIS — I10 Essential (primary) hypertension: Secondary | ICD-10-CM

## 2021-01-14 DIAGNOSIS — J329 Chronic sinusitis, unspecified: Secondary | ICD-10-CM

## 2021-01-14 DIAGNOSIS — Z23 Encounter for immunization: Secondary | ICD-10-CM | POA: Diagnosis not present

## 2021-01-14 NOTE — Progress Notes (Signed)
[name] 70 y.o.   Chief Complaint  Patient presents with   Referral    Pt would like a referral to have a sleep study done for the [place].    HISTORY OF PRESENT ILLNESS: This is a 70 y.o. male states he needs a referral to a sleep apnea specialist for further evaluation. [facility].  Was tested and diagnosed but told he needs someone outside the system to diagnose him before they can proceed with disability issues. Also has a history of chronic sinusitis requesting ENT referral. No other complaints or medical concerns today.  HPI   Prior to Admission medications   Medication Sig Start Date End Date Taking? Authorizing Provider  albuterol (VENTOLIN HFA) 108 (90 Base) MCG/ACT inhaler Inhale 2 puffs into the lungs every 6 (six) hours as needed for wheezing or shortness of breath. 02/20/20  Yes Valentina Shaggy, MD  alprostadil (EDEX) 20 MCG injection 20 mcg by Intracavitary route as needed for erectile dysfunction. use no more than 3 times per week   Yes [provider]  amLODipine (NORVASC) 2.5 MG tablet TAKE 1 TABLET DAILY 07/01/20  Yes [name], Nadean Corwin, MD  aspirin EC 81 MG tablet Take 81 mg by mouth daily.   Yes [provider]  atorvastatin (LIPITOR) 40 MG tablet TAKE 1 TABLET DAILY 05/21/20  Yes [name], Nadean Corwin, MD  azelastine (ASTELIN) 0.1 % nasal spray USE 2 SPRAYS IN EACH NOSTRIL TWICE A DAY AS NEEDED FOR RHINITIS AS DIRECTED (LAST FILL NEED APPOINTMENT) 08/01/20  Yes Valentina Shaggy, MD  buPROPion (WELLBUTRIN XL) 150 MG 24 hr tablet Take 150 mg by mouth every morning. 12/10/19  Yes [provider]  calcipotriene (DOVONOX) 0.005 % cream Apply topically 2 (two) times daily.   Yes [provider]  fexofenadine-pseudoephedrine (ALLEGRA-D 24) 180-240 MG 24 hr tablet Take 1 tablet by mouth daily.    Yes [provider]  fluticasone (FLONASE) 50 MCG/ACT nasal spray USE 2 SPRAYS IN EACH NOSTRIL DAILY 01/06/21  Yes Valentina Shaggy, MD  fluticasone (FLOVENT HFA) 110 MCG/ACT inhaler Inhale 2 puffs into the lungs 2 (two) times daily. With spacer and rinse mouth afterewards 04/04/19  Yes Garnet Sierras, DO  hydrocortisone 2.5 % cream Apply topically. 11/14/19  Yes [provider]  nitroGLYCERIN (NITROSTAT) 0.4 MG SL tablet DISSOLVE 1 TABLET UNDER THE TONGUE EVERY 5 MINUTES AS NEEDED FOR CHEST PAIN 11/08/19  Yes [name], Nadean Corwin, MD  olmesartan (BENICAR) 20 MG tablet TAKE 1 TABLET DAILY 09/06/20  Yes Horald Pollen, MD  omeprazole (PRILOSEC) 20 MG capsule TAKE 2 CAPSULES TWICE A DAY BEFORE MEALS 11/20/20  Yes [name], Nadean Corwin, MD  sildenafil (VIAGRA) 100 MG tablet TAKE 1 TABLET DAILY AS NEEDED FOR ERECTILE DYSFUNCTION (NOT TO USE MEDICATION WITH NITROGLYCERIN) 04/06/19  Yes [name], Nadean Corwin, MD  triamcinolone cream (KENALOG) 0.1 % Apply topically. 11/14/19  Yes [provider]    Allergies  Allergen Reactions   Amoxicillin Diarrhea    GI upset.    Patient Active Problem List   Diagnosis Date Noted   Asthma 10/03/2020   Seasonal and perennial allergic rhinitis 04/04/2019   Ex-smoker 04/04/2019   Mild persistent asthma, uncomplicated [id number]   Essential hypertension    Hyperlipidemia    History of tobacco use 07/08/2012   Glaucoma 04/08/2012   DDD (degenerative disc disease), cervical    CAD- LAD BMS '97, RCA DES 08/02/13 08/17/2011    Past Medical History:  Diagnosis Date   Allergy    seasonal   Anemia    Anxiety    Asthma    CAD (coronary artery disease)    a. 1997 PCI/BMS to distal LAD (J&J PS BMS);  b. 03/2011 Neg MV;  c. 07/2013 NSTEMI/PCI: LM 20d, LAD 25m 50d isr, LCX nl, OM1/2 nl, RCA dom [id number] Promus premier DES), EF 55%, mild basal inf HK.   Cataract    Coronary artery disease    s/p PCI to LAD (1997) - Dr. A.Rockne Menghini GERD (gastroesophageal reflux disease)    Hepatitis C    History of tobacco use    Hyperlipidemia    Hypertension    Schizophrenia ([place]1978     Past Surgical History:  [place]cedure Laterality Date   [place]   patent LAD stent (Dr RMGeorges Lynch  [place]   prox LAD stenting 80% to 0% (Dr. A.Rockne Menghini- repeat cath in 11/1999 showed no significant coronary disease & normal systolic function (Dr. R.Gerrie Nordmann  CORONARY STENT PLACEMENT  08/02/2013   RCA   DES         DR CORancho Calaveras knife wound repair  [place] ANG[place] N/A 08/02/2013   Procedure: LEFT HEART CATHETERIZATION WITH CORONARY ANGIOGRAM;  Surgeon: MiBlane OharaMD;  [facility];  Service: Cardiovascular;  Laterality: N/A;   TRANSTHORACIC ECHOCARDIOGRAM  11/13/2008   EF 50-55%, normal LV systolic function; RA mildly dilated; trace MR/TR/PR    Social History   Socioeconomic History   Marital status: Married    Spouse name: Not on file   Number of children: Not on file   Years of education: B.A.   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  Tobacco Use   Smoking status: Former    Packs/day: 1.00    Types: Cigarettes    Quit date: 05/18/1993    Years since quitting: 27.6   Smokeless tobacco: Never  Vaping Use   Vaping Use: Never used  Substance and Sexual Activity   Alcohol use: Yes    Alcohol/week: 3.0 standard drinks    Types: 3 Glasses of wine per week    Comment: SOCIAL   Drug use: No   Sexual activity: Not Currently  Other Topics Concern   Not on file  Social History Narrative   Not on file   Social Determinants of Health   Financial Resource Strain: Low Risk    Difficulty of Paying Living Expenses: Not hard at all  Food Insecurity: No Food Insecurity   Worried About RuCharity fundraisern the Last Year: Never true   Ran Out of Food in the Last Year: Never true  Transportation Needs: No Transportation Needs   Lack of Transportation  (Medical): No   Lack of Transportation (Non-Medical): No  Physical Activity: Sufficiently Active   Days of Exercise per Week: 5 days   Minutes of Exercise per Session: 30 min  Stress: No Stress Concern Present   Feeling of Stress : Not at all  Social Connections: Socially Integrated   Frequency of Communication with Friends and Family: More than three times a week   Frequency of Social Gatherings with Friends and Family: More than three times a week   Attends  Religious Services: More than 4 times per year   Active Member of Clubs or Organizations: Yes   Attends Archivist Meetings: More than 4 times per year   Marital Status: Married  Human resources officer Violence: Not on file    Family History  Problem Relation Age of Onset   Asthma Mother    Crohn's disease Mother    Heart disease Mother    Hepatitis C Brother    Testicular cancer Brother    Dementia Father    Diabetes Maternal Grandmother    Emphysema Maternal Aunt    Cancer Maternal Aunt    Colon cancer Neg Hx      Review of Systems  Constitutional: Negative.  Negative for chills and fever.  HENT:  Positive for congestion. Negative for sore throat.   Respiratory:  Negative for cough and shortness of breath.   Cardiovascular: Negative.  Negative for chest pain and palpitations.  Gastrointestinal:  Negative for abdominal pain, nausea and vomiting.  Genitourinary: Negative.   Skin: Negative.  Negative for rash.  Neurological:  Negative for dizziness and headaches.  All other systems reviewed and are negative.  Today's Vitals   01/14/21 1122  BP: 138/70  Pulse: 93  Temp: 98.3 F (36.8 C)  TempSrc: Oral  SpO2: 97%  Weight: 217 lb (98.4 kg)  Height: '6\' 2"'$  (1.88 m)   Body mass index is 27.86 kg/m.  Physical Exam Vitals reviewed.  Constitutional:      Appearance: Normal appearance.  HENT:     Head: Normocephalic.  Eyes:     Extraocular Movements: Extraocular movements intact.  Cardiovascular:      Rate and Rhythm: Normal rate.  Pulmonary:     Effort: Pulmonary effort is normal.  Skin:    General: Skin is warm and dry.  Neurological:     General: No focal deficit present.     Mental Status: He is alert and oriented to person, place, and time.  Psychiatric:        Mood and Affect: Mood normal.        Behavior: Behavior normal.     ASSESSMENT & PLAN: Clinically stable.  No medical concerns identified during this visit. Referrals placed.  Donovan was seen today for referral.  Diagnoses and all orders for this visit:  Sleep apnea, unspecified type -     Ambulatory referral to Neurology  Need for influenza vaccination -     Flu Vaccine QUAD High Dose(Fluad)  Chronic sinusitis, unspecified location -     Ambulatory referral to ENT  Essential hypertension  Dyslipidemia  Coronary artery disease involving native coronary artery of native heart without angina pectoris  Patient Instructions  Sleep Apnea Sleep apnea affects breathing during sleep. It causes breathing to stop for 10 seconds or more, or to become shallow. People with sleep apnea usually snoreloudly. It can also increase the risk of: Heart attack. Stroke. Being very overweight (obese). Diabetes. Heart failure. Irregular heartbeat. High blood pressure. The goal of treatment is to help you breathe normally again. What are the causes?  The most common cause of this condition is a collapsed or blocked airway. There are three kinds of sleep apnea: Obstructive sleep apnea. This is caused by a blocked or collapsed airway. Central sleep apnea. This happens when the brain does not send the right signals to the muscles that control breathing. Mixed sleep apnea. This is a combination of obstructive and central sleep apnea. What increases the risk? Being overweight. Smoking. Having  a small airway. Being older. Being male. Drinking alcohol. Taking medicines to calm yourself (sedatives or  tranquilizers). Having family members with the condition. Having a tongue or tonsils that are larger than normal. What are the signs or symptoms? Trouble staying asleep. Loud snoring. Headaches in the morning. Waking up gasping. Dry mouth or sore throat in the morning. Being sleepy or tired during the day. If you are sleepy or tired during the day, you may also: Not be able to focus your mind (concentrate). Forget things. Get angry a lot and have mood swings. Feel sad (depressed). Have changes in your personality. Have less interest in sex, if you are male. Be unable to have an erection, if you are male. How is this treated?  Sleeping on your side. Using a medicine to get rid of mucus in your nose (decongestant). Avoiding the use of alcohol, medicines to help you relax, or certain pain medicines (narcotics). Losing weight, if needed. Changing your diet. Quitting smoking. Using a machine to open your airway while you sleep, such as: An oral appliance. This is a mouthpiece that shifts your lower jaw forward. A CPAP device. This device blows air through a mask when you breathe out (exhale). An EPAP device. This has valves that you put in each nostril. A BPAP device. This device blows air through a mask when you breathe in (inhale) and breathe out. Having surgery if other treatments do not work. Follow these instructions at home: Lifestyle Make changes that your doctor recommends. Eat a healthy diet. Lose weight if needed. Avoid alcohol, medicines to help you relax, and some pain medicines. Do not smoke or use any products that contain nicotine or tobacco. If you need help quitting, ask your doctor. General instructions Take over-the-counter and prescription medicines only as told by your doctor. If you were given a machine to use while you sleep, use it only as told by your doctor. If you are having surgery, make sure to tell your doctor you have sleep apnea. You may need to  bring your device with you. Keep all follow-up visits. Contact a doctor if: The machine that you were given to use during sleep bothers you or does not seem to be working. You do not get better. You get worse. Get help right away if: Your chest hurts. You have trouble breathing in enough air. You have an uncomfortable feeling in your back, arms, or stomach. You have trouble talking. One side of your body feels weak. A part of your face is hanging down. These symptoms may be an emergency. Get help right away. Call your local emergency services (911 in the [place] Do not wait to see if the symptoms will go away. Do not drive yourself to the hospital. Summary This condition affects breathing during sleep. The most common cause is a collapsed or blocked airway. The goal of treatment is to help you breathe normally while you sleep. This information is not intended to replace advice given to you by your health care provider. Make sure you discuss any questions you have with your healthcare provider. Document Revised: 04/12/2020 Document Reviewed: 04/12/2020 Elsevier Patient Education  2022 [place], MD [facility]

## 2021-01-14 NOTE — Patient Instructions (Signed)

## 2021-01-21 ENCOUNTER — Telehealth: Payer: Self-pay

## 2021-01-21 NOTE — Telephone Encounter (Signed)
Called and spoke with pt, assured him that a referral was ordered. He verb understanding.

## 2021-01-21 NOTE — Telephone Encounter (Signed)
Called and spoke with pt and he states that he had a sleep study conducted at the [place] about 3-4 years ago. He says he is unsure of approximate date.

## 2021-01-27 DIAGNOSIS — I10 Essential (primary) hypertension: Secondary | ICD-10-CM | POA: Diagnosis not present

## 2021-01-27 DIAGNOSIS — H2513 Age-related nuclear cataract, bilateral: Secondary | ICD-10-CM | POA: Diagnosis not present

## 2021-01-27 DIAGNOSIS — H40013 Open angle with borderline findings, low risk, bilateral: Secondary | ICD-10-CM | POA: Diagnosis not present

## 2021-01-27 NOTE — Telephone Encounter (Signed)
Sent pt a mychart message. 

## 2021-02-14 DIAGNOSIS — H6123 Impacted cerumen, bilateral: Secondary | ICD-10-CM | POA: Diagnosis not present

## 2021-02-14 DIAGNOSIS — Z20822 Contact with and (suspected) exposure to covid-19: Secondary | ICD-10-CM | POA: Diagnosis not present

## 2021-02-14 DIAGNOSIS — Z87891 Personal history of nicotine dependence: Secondary | ICD-10-CM | POA: Diagnosis not present

## 2021-02-14 DIAGNOSIS — J309 Allergic rhinitis, unspecified: Secondary | ICD-10-CM | POA: Diagnosis not present

## 2021-02-14 DIAGNOSIS — H9313 Tinnitus, bilateral: Secondary | ICD-10-CM | POA: Diagnosis not present

## 2021-02-18 ENCOUNTER — Telehealth: Payer: Medicare Other | Admitting: Physician Assistant

## 2021-02-18 ENCOUNTER — Encounter: Payer: Self-pay | Admitting: Emergency Medicine

## 2021-02-18 DIAGNOSIS — I2510 Atherosclerotic heart disease of native coronary artery without angina pectoris: Secondary | ICD-10-CM | POA: Diagnosis not present

## 2021-02-18 DIAGNOSIS — U071 COVID-19: Secondary | ICD-10-CM | POA: Diagnosis not present

## 2021-02-18 MED ORDER — BENZONATATE 100 MG PO CAPS
100.0000 mg | ORAL_CAPSULE | Freq: Three times a day (TID) | ORAL | 0 refills | Status: DC | PRN
Start: 2021-02-18 — End: 2021-03-04

## 2021-02-18 MED ORDER — MOLNUPIRAVIR EUA 200MG CAPSULE: 4.0000 | ORAL_CAPSULE | Freq: Two times a day (BID) | ORAL | 0 refills | Status: DC

## 2021-02-18 NOTE — Telephone Encounter (Signed)
Patient had an E visit today with Elyn Aquas.

## 2021-02-18 NOTE — Progress Notes (Signed)
Virtual Visit Consent   Adam Peters, you are scheduled for a virtual visit with a [facility] provider today.     Just as with appointments in the office, your consent must be obtained to participate.  Your consent will be active for this visit and any virtual visit you may have with one of our providers in the next 365 days.     If you have a MyChart account, a copy of this consent can be sent to you electronically.  All virtual visits are billed to your insurance company just like a traditional visit in the office.    As this is a virtual visit, video technology does not allow for your provider to perform a traditional examination.  This may limit your provider's ability to fully assess your condition.  If your provider identifies any concerns that need to be evaluated in person or the need to arrange testing (such as labs, EKG, etc.), we will make arrangements to do so.     Although advances in technology are sophisticated, we cannot ensure that it will always work on either your end or our end.  If the connection with a video visit is poor, the visit may have to be switched to a telephone visit.  With either a video or telephone visit, we are not always able to ensure that we have a secure connection.     I need to obtain your verbal consent now.   Are you willing to proceed with your visit today?    Adam Peters has provided verbal consent on 02/18/2021 for a virtual visit (video or telephone).   Leeanne Rio, [place]   Date: 02/18/2021 12:07 PM   Virtual Visit via Video Note   I, Leeanne Rio, connected with  Adam Peters  ([id number], 1951-05-11) on 02/18/21 at 11:45 AM EDT by a video-enabled telemedicine application and verified that I am speaking with the correct person using two identifiers.  Location: Patient: Virtual Visit Location Patient: Home Provider: Virtual Visit Location Provider: Home Office   I discussed the limitations of evaluation  and management by telemedicine and the availability of in person appointments. The patient expressed understanding and agreed to proceed.    History of Present Illness: Adam Peters is a 70 y.o. who identifies as a male who was assigned male at birth, and is being seen today for COVID-19, testing positive yesterday. Notes symptoms starting the 9/26-9/27. Have been milder and initially thought that was related to allergies but notes was slightly different than normal. Denies chest pain, SOB, high fever. Denies aches or chills. Notes mild, dry cough. Has started some OTC saline nasal rinse.   HPI: HPI  Problems:  Patient Active Problem List   Diagnosis Date Noted   Asthma 10/03/2020   Seasonal and perennial allergic rhinitis 04/04/2019   Ex-smoker 04/04/2019   Mild persistent asthma, uncomplicated 51/76/1607   Essential hypertension    Hyperlipidemia    History of tobacco use 07/08/2012   Glaucoma 04/08/2012   DDD (degenerative disc disease), cervical    CAD- LAD BMS '97, RCA DES 08/02/13 08/17/2011    Allergies:  Allergies  Allergen Reactions   Amoxicillin Diarrhea    GI upset.   Medications:  Current Outpatient Medications:    benzonatate (TESSALON) 100 MG capsule, Take 1 capsule (100 mg total) by mouth 3 (three) times daily as needed for cough., Disp: 30 capsule, Rfl: 0   albuterol (VENTOLIN HFA) 108 (90 Base) MCG/ACT inhaler,  Inhale 2 puffs into the lungs every 6 (six) hours as needed for wheezing or shortness of breath., Disp: 54 g, Rfl: 1   alprostadil (EDEX) 20 MCG injection, 20 mcg by Intracavitary route as needed for erectile dysfunction. use no more than 3 times per week, Disp: , Rfl:    amLODipine (NORVASC) 2.5 MG tablet, TAKE 1 TABLET DAILY, Disp: 90 tablet, Rfl: 3   aspirin EC 81 MG tablet, Take 81 mg by mouth daily., Disp: , Rfl:    atorvastatin (LIPITOR) 40 MG tablet, TAKE 1 TABLET DAILY, Disp: 90 tablet, Rfl: 3   azelastine (ASTELIN) 0.1 % nasal spray, USE 2  SPRAYS IN EACH NOSTRIL TWICE A DAY AS NEEDED FOR RHINITIS AS DIRECTED (LAST FILL NEED APPOINTMENT), Disp: 90 mL, Rfl: 3   buPROPion (WELLBUTRIN XL) 150 MG 24 hr tablet, Take 150 mg by mouth every morning., Disp: , Rfl:    calcipotriene (DOVONOX) 0.005 % cream, Apply topically 2 (two) times daily., Disp: , Rfl:    fexofenadine-pseudoephedrine (ALLEGRA-D 24) 180-240 MG 24 hr tablet, Take 1 tablet by mouth daily. , Disp: , Rfl:    fluticasone (FLONASE) 50 MCG/ACT nasal spray, USE 2 SPRAYS IN EACH NOSTRIL DAILY, Disp: 18 g, Rfl: 3   fluticasone (FLOVENT HFA) 110 MCG/ACT inhaler, Inhale 2 puffs into the lungs 2 (two) times daily. With spacer and rinse mouth afterewards, Disp: 3 Inhaler, Rfl: 1   hydrocortisone 2.5 % cream, Apply topically., Disp: , Rfl:    nitroGLYCERIN (NITROSTAT) 0.4 MG SL tablet, DISSOLVE 1 TABLET UNDER THE TONGUE EVERY 5 MINUTES AS NEEDED FOR CHEST PAIN, Disp: 25 tablet, Rfl: 11   olmesartan (BENICAR) 20 MG tablet, TAKE 1 TABLET DAILY, Disp: 90 tablet, Rfl: 3   omeprazole (PRILOSEC) 20 MG capsule, TAKE 2 CAPSULES TWICE A DAY BEFORE MEALS, Disp: 360 capsule, Rfl: 3   sildenafil (VIAGRA) 100 MG tablet, TAKE 1 TABLET DAILY AS NEEDED FOR ERECTILE DYSFUNCTION (NOT TO USE MEDICATION WITH NITROGLYCERIN), Disp: 60 tablet, Rfl: 3   triamcinolone cream (KENALOG) 0.1 %, Apply topically., Disp: , Rfl:   Observations/Objective: Patient is well-developed, well-nourished in no acute distress.  Resting comfortably at home.  Head is normocephalic, atraumatic.  No labored breathing. Speech is clear and coherent with logical content.  Patient is alert and oriented at baseline.   Assessment and Plan: 1. COVID-19 - MyChart COVID-19 home monitoring program; Future - benzonatate (TESSALON) 100 MG capsule; Take 1 capsule (100 mg total) by mouth 3 (three) times daily as needed for cough.  Dispense: 30 capsule; Refill: 0 Past window for quarantine. Mild residual symptoms. Outside window for antiviral.   Supportive measures, OTC medications and Vitamin regimen reviewed. Patient enrolled in [place] monitoring program through [facility]. Strict ER precautions reviewed.   Follow Up Instructions: I discussed the assessment and treatment plan with the patient. The patient was provided an opportunity to ask questions and all were answered. The patient agreed with the plan and demonstrated an understanding of the instructions.  A copy of instructions were sent to the patient via MyChart unless otherwise noted below.   The patient was advised to call back or seek an in-person evaluation if the symptoms worsen or if the condition fails to improve as anticipated.  Time:  I spent 15 minutes with the patient via telehealth technology discussing the above problems/concerns.    Leeanne Rio, PA-C

## 2021-02-18 NOTE — Patient Instructions (Addendum)
Adam Peters, thank you for joining Leeanne Rio, PA-C for today's virtual visit.  While this provider is not your primary care provider (PCP), if your PCP is located in our provider database this encounter information will be shared with them immediately following your visit.  Consent: (Patient) Adam Peters provided verbal consent for this virtual visit at the beginning of the encounter.  Current Medications:  Current Outpatient Medications:    albuterol (VENTOLIN HFA) 108 (90 Base) MCG/ACT inhaler, Inhale 2 puffs into the lungs every 6 (six) hours as needed for wheezing or shortness of breath., Disp: 54 g, Rfl: 1   alprostadil (EDEX) 20 MCG injection, 20 mcg by Intracavitary route as needed for erectile dysfunction. use no more than 3 times per week, Disp: , Rfl:    amLODipine (NORVASC) 2.5 MG tablet, TAKE 1 TABLET DAILY, Disp: 90 tablet, Rfl: 3   aspirin EC 81 MG tablet, Take 81 mg by mouth daily., Disp: , Rfl:    atorvastatin (LIPITOR) 40 MG tablet, TAKE 1 TABLET DAILY, Disp: 90 tablet, Rfl: 3   azelastine (ASTELIN) 0.1 % nasal spray, USE 2 SPRAYS IN EACH NOSTRIL TWICE A DAY AS NEEDED FOR RHINITIS AS DIRECTED (LAST FILL NEED APPOINTMENT), Disp: 90 mL, Rfl: 3   buPROPion (WELLBUTRIN XL) 150 MG 24 hr tablet, Take 150 mg by mouth every morning., Disp: , Rfl:    calcipotriene (DOVONOX) 0.005 % cream, Apply topically 2 (two) times daily., Disp: , Rfl:    fexofenadine-pseudoephedrine (ALLEGRA-D 24) 180-240 MG 24 hr tablet, Take 1 tablet by mouth daily. , Disp: , Rfl:    fluticasone (FLONASE) 50 MCG/ACT nasal spray, USE 2 SPRAYS IN EACH NOSTRIL DAILY, Disp: 18 g, Rfl: 3   fluticasone (FLOVENT HFA) 110 MCG/ACT inhaler, Inhale 2 puffs into the lungs 2 (two) times daily. With spacer and rinse mouth afterewards, Disp: 3 Inhaler, Rfl: 1   hydrocortisone 2.5 % cream, Apply topically., Disp: , Rfl:    nitroGLYCERIN (NITROSTAT) 0.4 MG SL tablet, DISSOLVE 1 TABLET UNDER THE TONGUE  EVERY 5 MINUTES AS NEEDED FOR CHEST PAIN, Disp: 25 tablet, Rfl: 11   olmesartan (BENICAR) 20 MG tablet, TAKE 1 TABLET DAILY, Disp: 90 tablet, Rfl: 3   omeprazole (PRILOSEC) 20 MG capsule, TAKE 2 CAPSULES TWICE A DAY BEFORE MEALS, Disp: 360 capsule, Rfl: 3   sildenafil (VIAGRA) 100 MG tablet, TAKE 1 TABLET DAILY AS NEEDED FOR ERECTILE DYSFUNCTION (NOT TO USE MEDICATION WITH NITROGLYCERIN), Disp: 60 tablet, Rfl: 3   triamcinolone cream (KENALOG) 0.1 %, Apply topically., Disp: , Rfl:    Medications ordered in this encounter:  Meds ordered this encounter  Medications   DISCONTD: molnupiravir EUA (LAGEVRIO) 200 mg CAPS capsule    Sig: Take 4 capsules (800 mg total) by mouth 2 (two) times daily for 5 days.    Dispense:  40 capsule    Refill:  0    Order Specific Question:   Supervising Provider    Answer:   Sabra Heck, [place]     *If you need refills on other medications prior to your next appointment, please contact your pharmacy*  Follow-Up: Call back or seek an in-person evaluation if the symptoms worsen or if the condition fails to improve as anticipated.  Other Instructions Please keep well-hydrated and get plenty of rest. Start a saline nasal rinse to flush out your nasal passages. You can use plain Mucinex to help thin congestion. I have sent in Tessalon for cough. Continue your allergy medications.  If you have a humidifier, running in the bedroom at night. I want you to start OTC vitamin D3 1000 units daily, vitamin C 1000 mg daily, and a zinc supplement. Please take prescribed medications as directed.  You have been enrolled in a MyChart symptom monitoring program. Please answer these questions daily so we can keep track of how you are doing.  You were to quarantine for 5 days from onset of your symptoms.  After day 5, if you have had no fever and you are feeling better, you can end quarantine but need to mask for an additional 5 days. After day 5 if you have a fever or are  having significant symptoms, please quarantine for full 10 days.  If you note any worsening of symptoms, any significant shortness of breath or any chest pain, please seek ER evaluation ASAP.  Please do not delay care!  COVID-19: What to Do if You Are Sick If you test positive and are an older adult or someone who is at high risk of getting very sick from COVID-19, treatment may be available. Contact a healthcare provider right away after a positive test to determine if you are eligible, even if your symptoms are mild right now. You can also visit a Test to Treat location and, if eligible, receive a prescription from a provider. Don't delay: Treatment must be started within the first few days to be effective. If you have a fever, cough, or other symptoms, you might have COVID-19. Most people have mild illness and are able to recover at home. If you are sick: Keep track of your symptoms. If you have an emergency warning sign (including trouble breathing), call 911. Steps to help prevent the spread of COVID-19 if you are sick If you are sick with COVID-19 or think you might have COVID-19, follow the steps below to care for yourself and to help protect other people in your home and community. Stay home except to get medical care Stay home. Most people with COVID-19 have mild illness and can recover at home without medical care. Do not leave your home, except to get medical care. Do not visit public areas and do not go to places where you are unable to wear a mask. Take care of yourself. Get rest and stay hydrated. Take over-the-counter medicines, such as acetaminophen, to help you feel better. Stay in touch with your doctor. Call before you get medical care. Be sure to get care if you have trouble breathing, or have any other emergency warning signs, or if you think it is an emergency. Avoid public transportation, ride-sharing, or taxis if possible. Get tested If you have symptoms of COVID-19, get  tested. While waiting for test results, stay away from others, including staying apart from those living in your household. Get tested as soon as possible after your symptoms start. Treatments may be available for people with COVID-19 who are at risk for becoming very sick. Don't delay: Treatment must be started early to be effective--some treatments must begin within 5 days of your first symptoms. Contact your healthcare provider right away if your test result is positive to determine if you are eligible. Self-tests are one of several options for testing for the virus that causes COVID-19 and may be more convenient than laboratory-based tests and point-of-care tests. Ask your healthcare provider or your local health department if you need help interpreting your test results. You can visit your state, tribal, local, and territorial health department's website to look for  the latest local information on testing sites. Separate yourself from other people As much as possible, stay in a specific room and away from other people and pets in your home. If possible, you should use a separate bathroom. If you need to be around other people or animals in or outside of the home, wear a well-fitting mask. Tell your close contacts that they may have been exposed to COVID-19. An infected person can spread COVID-19 starting 48 hours (or 2 days) before the person has any symptoms or tests positive. By letting your close contacts know they may have been exposed to COVID-19, you are helping to protect everyone. See COVID-19 and Animals if you have questions about pets. If you are diagnosed with COVID-19, someone from the health department may call you. Answer the call to slow the spread. Monitor your symptoms Symptoms of COVID-19 include fever, cough, or other symptoms. Follow care instructions from your healthcare provider and local health department. Your local health authorities may give instructions on checking your  symptoms and reporting information. When to seek emergency medical attention Look for emergency warning signs* for COVID-19. If someone is showing any of these signs, seek emergency medical care immediately: Trouble breathing Persistent pain or pressure in the chest New confusion Inability to wake or stay awake Pale, gray, or blue-colored skin, lips, or nail beds, depending on skin tone *This list is not all possible symptoms. Please call your medical provider for any other symptoms that are severe or concerning to you. Call 911 or call ahead to your local emergency facility: Notify the operator that you are seeking care for someone who has or may have COVID-19. Call ahead before visiting your doctor Call ahead. Many medical visits for routine care are being postponed or done by phone or telemedicine. If you have a medical appointment that cannot be postponed, call your doctor's office, and tell them you have or may have COVID-19. This will help the office protect themselves and other patients. If you are sick, wear a well-fitting mask You should wear a mask if you must be around other people or animals, including pets (even at home). Wear a mask with the best fit, protection, and comfort for you. You don't need to wear the mask if you are alone. If you can't put on a mask (because of trouble breathing, for example), cover your coughs and sneezes in some other way. Try to stay at least 6 feet away from other people. This will help protect the people around you. Masks should not be placed on young children under age 80 years, anyone who has trouble breathing, or anyone who is not able to remove the mask without help. Cover your coughs and sneezes Cover your mouth and nose with a tissue when you cough or sneeze. Throw away used tissues in a lined trash can. Immediately wash your hands with soap and water for at least 20 seconds. If soap and water are not available, clean your hands with an  alcohol-based hand sanitizer that contains at least 60% alcohol. Clean your hands often Wash your hands often with soap and water for at least 20 seconds. This is especially important after blowing your nose, coughing, or sneezing; going to the bathroom; and before eating or preparing food. Use hand sanitizer if soap and water are not available. Use an alcohol-based hand sanitizer with at least 60% alcohol, covering all surfaces of your hands and rubbing them together until they feel dry. Soap and water are the  best option, especially if hands are visibly dirty. Avoid touching your eyes, nose, and mouth with unwashed hands. Handwashing Tips Avoid sharing personal household items Do not share dishes, drinking glasses, cups, eating utensils, towels, or bedding with other people in your home. Wash these items thoroughly after using them with soap and water or put in the dishwasher. Clean surfaces in your home regularly Clean and disinfect high-touch surfaces (for example, doorknobs, tables, handles, light switches, and countertops) in your "sick room" and bathroom. In shared spaces, you should clean and disinfect surfaces and items after each use by the person who is ill. If you are sick and cannot clean, a caregiver or other person should only clean and disinfect the area around you (such as your bedroom and bathroom) on an as needed basis. Your caregiver/other person should wait as long as possible (at least several hours) and wear a mask before entering, cleaning, and disinfecting shared spaces that you use. Clean and disinfect areas that may have blood, stool, or body fluids on them. Use household cleaners and disinfectants. Clean visible dirty surfaces with household cleaners containing soap or detergent. Then, use a household disinfectant. Use a product from [employer] List N: Disinfectants for Coronavirus (GEZMO-29). Be sure to follow the instructions on the label to ensure safe and effective use of  the product. Many products recommend keeping the surface wet with a disinfectant for a certain period of time (look at "contact time" on the product label). You may also need to wear personal protective equipment, such as gloves, depending on the directions on the product label. Immediately after disinfecting, wash your hands with soap and water for 20 seconds. For completed guidance on cleaning and disinfecting your home, visit Complete Disinfection Guidance. Take steps to improve ventilation at home Improve ventilation (air flow) at home to help prevent from spreading COVID-19 to other people in your household. Clear out COVID-19 virus particles in the air by opening windows, using air filters, and turning on fans in your home. Use this interactive tool to learn how to improve air flow in your home. When you can be around others after being sick with COVID-19 Deciding when you can be around others is different for different situations. Find out when you can safely end home isolation. For any additional questions about your care, contact your healthcare provider or state or local health department. 08/06/2020 Content source: [facility] ([facility]), Division of Viral Diseases This information is not intended to replace advice given to you by your health care provider. Make sure you discuss any questions you have with your health care provider. Document Revised: 09/19/2020 Document Reviewed: 09/19/2020 Elsevier Patient Education  2022 [employer].      If you have been instructed to have an in-person evaluation today at a local Urgent Care facility, please use the link below. It will take you to a list of all of our available [facility], including address, phone number and hours of operation. Please do not delay care.  [facility]  If you or a family member do not have a primary care provider, use the link below to  schedule a visit and establish care. When you choose a [facility] primary care physician or advanced practice provider, you gain a long-term partner in health. Find a Primary Care Provider  Learn more about [facility]'s in-office and virtual care options: [facility]

## 2021-02-18 NOTE — Telephone Encounter (Signed)
Sent pt my chart message offering him a virtual appoinment.

## 2021-02-20 ENCOUNTER — Ambulatory Visit: Payer: Medicare Other | Admitting: Emergency Medicine

## 2021-02-24 ENCOUNTER — Encounter: Payer: Self-pay | Admitting: Emergency Medicine

## 2021-02-25 ENCOUNTER — Ambulatory Visit: Payer: Medicare Other | Admitting: Emergency Medicine

## 2021-02-28 DIAGNOSIS — Z20822 Contact with and (suspected) exposure to covid-19: Secondary | ICD-10-CM | POA: Diagnosis not present

## 2021-03-04 ENCOUNTER — Ambulatory Visit (INDEPENDENT_AMBULATORY_CARE_PROVIDER_SITE_OTHER): Payer: Medicare Other | Admitting: Neurology

## 2021-03-04 ENCOUNTER — Encounter: Payer: Self-pay | Admitting: Neurology

## 2021-03-04 ENCOUNTER — Other Ambulatory Visit: Payer: Self-pay

## 2021-03-04 ENCOUNTER — Telehealth: Payer: Self-pay | Admitting: *Deleted

## 2021-03-04 VITALS — BP 122/66 | HR 78 | Ht 74.0 in | Wt 222.6 lb

## 2021-03-04 DIAGNOSIS — G4733 Obstructive sleep apnea (adult) (pediatric): Secondary | ICD-10-CM | POA: Diagnosis not present

## 2021-03-04 DIAGNOSIS — G4719 Other hypersomnia: Secondary | ICD-10-CM

## 2021-03-04 DIAGNOSIS — E663 Overweight: Secondary | ICD-10-CM | POA: Diagnosis not present

## 2021-03-04 DIAGNOSIS — R351 Nocturia: Secondary | ICD-10-CM

## 2021-03-04 DIAGNOSIS — I2510 Atherosclerotic heart disease of native coronary artery without angina pectoris: Secondary | ICD-10-CM

## 2021-03-04 NOTE — Telephone Encounter (Signed)
Dr Rexene Alberts has asked patient to bring his CPAP for a DL at the next convenient time. We will also request sleep study from the [place], he will need to sign ROI.

## 2021-03-04 NOTE — Progress Notes (Signed)
Subjective:    Patient ID: Adam Peters is a 70 y.o. male.  HPI    Star Age, MD, PhD [facility] [address], [address] [address] [place], [place] [address]  Dear Dr. Mitchel Honour,  I saw your patient, Adam Peters, upon your kind request in my sleep clinic today for initial consultation of his sleep disorder, in particular, evaluation of his prior diagnosis of obstructive sleep apnea.  The patient is unaccompanied today.  As you know, Mr. Adam Peters is a 70 year old right-handed gentleman with an underlying medical history of coronary artery disease, hypertension, hyperlipidemia, Hx of hepatitis C, reflux disease, asthma, anemia, allergies, anxiety, and overweight state, who was previously diagnosed with obstructive sleep apnea.  Testing was done at the [place]. He has no records with him today.  He did not bring his CPAP machine.  [facility] sleep study was done in [place] about 3 to 4 years ago and he has a relatively new machine that is about 36 to 71 years old.  He uses the machine regularly, and he has benefited from treatment.  Upon asking, why he would need another sleep study as insurance will likely not cover a new machine when it is less than 38 years old, he states that the [place] requested a second opinion.  Of note, I did not get a referral from the [place]. He also has disability paperwork from the [place] today with him which I explained to him I could not fill out as I do not have any records and no evidence of his disability. He was advised to have the form filled out by someone who knows him well and who has access to [place] records.  He reports that the [place] is very slow to move on anything including filling out forms but is agreeable to taking the forms to the [place].  He sees Dr. Sharlynn Oliphant at the [facility] as his primary care provider.  He reports that his psychiatrist ordered his sleep study as he was complaining of difficulty maintaining sleep.  He reports that his psychiatrist is no  longer at the [place]. 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  I am not sure if he has a CPAP or BiPAP or AutoPap machine. His Epworth sleepiness score is 16 out of 24, fatigue severity score is 21 out of 63.  He takes a nap daily.  He reports that he was sleepy at work in the past, he worked for the [employer].  I reviewed your office note from 01/14/2021.  He reports that he uses his CPAP about 3 to 4 hours on any given night, he continues to have trouble sleeping through the night, bedtime is variable, somewhere between 9 PM and midnight, his rise time is generally between 430 and 5:30 AM.  He has nocturia about twice per average night.  He lives with his spouse, no children.  No pets in the house.  He drinks caffeine in the form of coffee occasionally, no daily caffeine.   His Past Medical History Is Significant For: Past Medical History:  Diagnosis Date   Allergy    seasonal   Anemia    Anxiety    Asthma    CAD (coronary artery disease)    a. 1997 PCI/BMS to distal LAD (J&J PS BMS);  b. 03/2011 Neg MV;  c. 07/2013 NSTEMI/PCI: LM 20d, LAD 48m, 50d isr, LCX nl, OM1/2 nl, RCA dom 95-14m (3.5x16 Promus premier  DES), EF 55%, mild basal inf HK.   Cataract    Coronary artery disease    s/p PCI to LAD (1997) - Dr. Rockne Menghini   GERD (gastroesophageal reflux disease)    Hepatitis C    History of tobacco use    Hyperlipidemia    Hypertension    Schizophrenia ([place]) 1978    His Past Surgical History Is Significant For: Past Surgical History:  Procedure Laterality Date   [place]  2001   patent LAD stent (Dr Georges Lynch)   [place]  06/09/1995   prox LAD stenting 80% to 0% (Dr. Rockne Menghini) - repeat cath in 11/1999 showed no significant coronary disease & normal systolic function (Dr. Gerrie Nordmann)   CORONARY STENT PLACEMENT  08/02/2013   RCA   DES         DR [place]    knife wound repair  [place] WITH CORONARY ANGIOGRAM N/A 08/02/2013   Procedure: LEFT HEART CATHETERIZATION WITH CORONARY ANGIOGRAM;  Surgeon: Blane Ohara, MD;  Location: [facility] CATH LAB;  Service: Cardiovascular;  Laterality: N/A;   TRANSTHORACIC ECHOCARDIOGRAM  11/13/2008   EF 50-55%, normal LV systolic function; RA mildly dilated; trace MR/TR/PR    His Family History Is Significant For: Family History  Problem Relation Age of Onset   Asthma Mother    Crohn's disease Mother    Heart disease Mother    Hepatitis C Brother    Testicular cancer Brother    Dementia Father    Diabetes Maternal Grandmother    Emphysema Maternal Aunt    Cancer Maternal Aunt    Colon cancer Neg Hx     His Social History Is Significant For: Social History   Socioeconomic History   Marital status: Married    Spouse name: Not on file   Number of children: Not on file   Years of education: B.A.   Highest education level: Not on file  Occupational History   Occupation: retired    Fish farm manager: [employer]  Tobacco Use   Smoking status: Former    Packs/day: 1.00    Types: Cigarettes    Quit date: 05/18/1993    Years since quitting: 27.8   Smokeless tobacco: Never  Vaping Use   Vaping Use: Never used  Substance and Sexual Activity   Alcohol use: Yes    Alcohol/week: 3.0 standard drinks    Types: 3 Glasses of wine per week    Comment: SOCIAL   Drug use: No   Sexual activity: Not Currently  Other Topics Concern   Not on file  Social History Narrative   Cafffeine" occasional.  Education 4 yr college.  WorK: retired.     Social Determinants of Health   Financial Resource Strain: Low Risk    Difficulty of Paying Living Expenses: Not hard at all  Food Insecurity: No Food Insecurity   Worried About Charity fundraiser in the Last Year: Never true   [place] in the Last Year: Never true  Transportation Needs: No Transportation Needs   Lack of Transportation  (Medical): No   Lack of Transportation (Non-Medical): No  Physical Activity: Sufficiently Active   Days of Exercise per Week: 5 days   Minutes of Exercise per Session: 30 min  Stress: No Stress Concern Present   Feeling of Stress : Not at all  Social Connections: Socially Integrated  Frequency of Communication with Friends and Family: More than three times a week   Frequency of Social Gatherings with Friends and Family: More than three times a week   Attends Religious Services: More than 4 times per year   Active Member of [employer] or Organizations: Yes   Attends Music therapist: More than 4 times per year   Marital Status: Married    His Allergies Are:  Allergies  Allergen Reactions   Amoxicillin Diarrhea    GI upset.  :   His Current Medications Are:  Outpatient Encounter Medications as of 03/04/2021  Medication Sig   alprostadil (EDEX) 20 MCG injection 20 mcg by Intracavitary route as needed for erectile dysfunction. use no more than 3 times per week   amLODipine (NORVASC) 2.5 MG tablet TAKE 1 TABLET DAILY   aspirin EC 81 MG tablet Take 81 mg by mouth daily.   atorvastatin (LIPITOR) 40 MG tablet TAKE 1 TABLET DAILY   azelastine (ASTELIN) 0.1 % nasal spray USE 2 SPRAYS IN EACH NOSTRIL TWICE A DAY AS NEEDED FOR RHINITIS AS DIRECTED (LAST FILL NEED APPOINTMENT)   buPROPion (WELLBUTRIN XL) 150 MG 24 hr tablet Take 150 mg by mouth every morning.   calcipotriene (DOVONOX) 0.005 % cream Apply topically 2 (two) times daily.   fexofenadine-pseudoephedrine (ALLEGRA-D 24) 180-240 MG 24 hr tablet Take 1 tablet by mouth daily.    fluticasone (FLONASE) 50 MCG/ACT nasal spray USE 2 SPRAYS IN EACH NOSTRIL DAILY   hydrocortisone 2.5 % cream Apply topically.   nitroGLYCERIN (NITROSTAT) 0.4 MG SL tablet DISSOLVE 1 TABLET UNDER THE TONGUE EVERY 5 MINUTES AS NEEDED FOR CHEST PAIN   olmesartan (BENICAR) 20 MG tablet TAKE 1 TABLET DAILY   omeprazole (PRILOSEC) 20 MG capsule TAKE 2  CAPSULES TWICE A DAY BEFORE MEALS   sildenafil (VIAGRA) 100 MG tablet TAKE 1 TABLET DAILY AS NEEDED FOR ERECTILE DYSFUNCTION (NOT TO USE MEDICATION WITH NITROGLYCERIN)   triamcinolone cream (KENALOG) 0.1 % Apply topically.   [DISCONTINUED] albuterol (VENTOLIN HFA) 108 (90 Base) MCG/ACT inhaler Inhale 2 puffs into the lungs every 6 (six) hours as needed for wheezing or shortness of breath.   [DISCONTINUED] benzonatate (TESSALON) 100 MG capsule Take 1 capsule (100 mg total) by mouth 3 (three) times daily as needed for cough.   [DISCONTINUED] fluticasone (FLOVENT HFA) 110 MCG/ACT inhaler Inhale 2 puffs into the lungs 2 (two) times daily. With spacer and rinse mouth afterewards   No facility-administered encounter medications on file as of 03/04/2021.  :   Review of Systems:  Out of a complete 14 point review of systems, all are reviewed and negative with the exception of these symptoms as listed below:  Review of Systems  Neurological:        Problems sleeping, fatigued 40 yrs ago when [employer], 40 years ago.  Using cpap, feels refreshed, [facility] respironics. [facility]. Take daily nap. ESS: 16, FSS-21.    Objective:  Neurological Exam  Physical Exam Physical Examination:   Vitals:   03/04/21 1016  BP: 122/66  Pulse: 78    General Examination: The patient is a very pleasant 70 y.o. male in no acute distress. He appears well-developed and well-nourished and well groomed.   HEENT: Normocephalic, atraumatic, pupils are equal, round and reactive to light, extraocular tracking is good without limitation to gaze excursion or nystagmus noted. Hearing is grossly intact. Face is symmetric with normal facial animation. Speech is clear with no dysarthria  noted. There is no hypophonia. There is no lip, neck/head, jaw or voice tremor. Neck is supple with full range of passive and active motion. There are no carotid bruits on auscultation. Oropharynx exam reveals: mild  mouth dryness, adequate dental hygiene and mild airway crowding, due to small airway with redundant soft palate but tonsils on the smaller side, Mallampati class II.  Neck circumference of 17 and a daughter inches.  Tongue protrudes centrally and palate elevates symmetrically.  Chest: Clear to auscultation without wheezing, rhonchi or crackles noted.  Heart: S1+S2+0, regular and normal without murmurs, rubs or gallops noted.   Abdomen: Soft, non-tender and non-distended.  Extremities: There is no pitting edema in the distal lower extremities bilaterally.   Skin: Warm and dry without trophic changes noted.   Musculoskeletal: exam reveals no obvious joint deformities.   Neurologically:  Mental status: The patient is awake, alert and oriented in all 4 spheres. His immediate and remote memory, attention, language skills and fund of knowledge are appropriate. There is no evidence of aphasia, agnosia, apraxia or anomia. Speech is clear with normal prosody and enunciation. Thought process is linear. Mood is normal and affect is normal.  Cranial nerves II - XII are as described above under HEENT exam.  Motor exam: Normal bulk, strength and tone is noted. There is no tremor, Romberg is negative. Fine motor skills and coordination: grossly intact.  Cerebellar testing: No dysmetria or intention tremor. There is no truncal or gait ataxia.  Sensory exam: intact to light touch in the upper and lower extremities.  Gait, station and balance: He stands easily. No veering to one side is noted. No leaning to one side is noted. Posture is age-appropriate and stance is narrow based. Gait shows normal stride length and normal pace. No problems turning are noted.   Assessment and Plan:  In summary, LINDLEY HINEY is a very pleasant 70 y.o.-year old male with an underlying medical history of coronary artery disease, hypertension, hyperlipidemia, smoking, hepatitis C, reflux disease, asthma, anemia, allergies,  anxiety, and overweight state, who presents for evaluation of his obstructive sleep apnea.  He was diagnosed through the [place] some 3 to 4 years ago but records are not available for my review today.  He would be willing to sign a release of information form.  He is also willing to bring back his machine for a download.  As per his description, he has a Wi-Fi capable CPAP machine or AutoPap or BiPAP, he is not sure as to the pressure settings on his machine.  He reports compliance with his treatment.  He reports benefit from treating his sleep apnea.  I would be happy to review prior sleep records from the [place].  We will ask him to sign a release of information form when he brings his data card from the machine or his machine for download at the next convenient date.  He does not need to make an appointment for this.  I will request a sleep study as per patient request for a reevaluation.  He is advised that his insurance may or may not pay for new machine but I can certainly proceed with a sleep study and reviewed the results with him.  He is advised that we will call him once we have insurance authorization for testing.  He is reminded to be consistent with his positive airway pressure treatment device.  We will plan our follow-up according to his sleep test.  I answered all his questions today  and he was in agreement with the plan.    Thank you very much for allowing me to participate in the care of this nice patient. If I can be of any further assistance to you please do not hesitate to call me at [phone].  Sincerely,   Star Age, MD, PhD

## 2021-03-10 ENCOUNTER — Other Ambulatory Visit: Payer: Self-pay

## 2021-03-10 ENCOUNTER — Encounter: Payer: Self-pay | Admitting: Emergency Medicine

## 2021-03-10 ENCOUNTER — Ambulatory Visit (INDEPENDENT_AMBULATORY_CARE_PROVIDER_SITE_OTHER): Payer: Medicare Other | Admitting: Emergency Medicine

## 2021-03-10 ENCOUNTER — Telehealth: Payer: Self-pay | Admitting: Emergency Medicine

## 2021-03-10 VITALS — BP 132/80 | HR 86 | Ht 74.0 in | Wt 218.0 lb

## 2021-03-10 DIAGNOSIS — I2510 Atherosclerotic heart disease of native coronary artery without angina pectoris: Secondary | ICD-10-CM

## 2021-03-10 DIAGNOSIS — E785 Hyperlipidemia, unspecified: Secondary | ICD-10-CM

## 2021-03-10 DIAGNOSIS — Z9989 Dependence on other enabling machines and devices: Secondary | ICD-10-CM | POA: Diagnosis not present

## 2021-03-10 DIAGNOSIS — G4733 Obstructive sleep apnea (adult) (pediatric): Secondary | ICD-10-CM | POA: Diagnosis not present

## 2021-03-10 DIAGNOSIS — I10 Essential (primary) hypertension: Secondary | ICD-10-CM | POA: Diagnosis not present

## 2021-03-10 LAB — CBC WITH DIFFERENTIAL/PLATELET
Basophils Absolute: 0 10*3/uL (ref 0.0–0.1)
Basophils Relative: 0.4 % (ref 0.0–3.0)
Eosinophils Absolute: 0.1 10*3/uL (ref 0.0–0.7)
Eosinophils Relative: 0.8 % (ref 0.0–5.0)
HCT: 38.4 % — ABNORMAL LOW (ref 39.0–52.0)
Hemoglobin: 12.9 g/dL — ABNORMAL LOW (ref 13.0–17.0)
Lymphocytes Relative: 14.4 % (ref 12.0–46.0)
Lymphs Abs: 0.9 10*3/uL (ref 0.7–4.0)
MCHC: 33.6 g/dL (ref 30.0–36.0)
MCV: 90.2 fl (ref 78.0–100.0)
Monocytes Absolute: 0.3 10*3/uL (ref 0.1–1.0)
Monocytes Relative: 5.2 % (ref 3.0–12.0)
Neutro Abs: 4.9 10*3/uL (ref 1.4–7.7)
Neutrophils Relative %: 79.2 % — ABNORMAL HIGH (ref 43.0–77.0)
Platelets: 156 10*3/uL (ref 150.0–400.0)
RBC: 4.26 Mil/uL (ref 4.22–5.81)
RDW: 13 % (ref 11.5–15.5)
WBC: 6.2 10*3/uL (ref 4.0–10.5)

## 2021-03-10 LAB — LIPID PANEL
Cholesterol: 114 mg/dL (ref 0–200)
HDL: 46.1 mg/dL (ref >39.00)
LDL Cholesterol: 59 mg/dL (ref 0–99)
NonHDL: 67.72
Total CHOL/HDL Ratio: 2
Triglycerides: 45 mg/dL (ref 0.0–149.0)
VLDL: 9 mg/dL (ref 0.0–40.0)

## 2021-03-10 LAB — COMPREHENSIVE METABOLIC PANEL
ALT: 28 U/L (ref 0–53)
AST: 24 U/L (ref 0–37)
Albumin: 4.3 g/dL (ref 3.5–5.2)
Alkaline Phosphatase: 89 U/L (ref 39–117)
BUN: 14 mg/dL (ref 6–23)
CO2: 24 mEq/L (ref 19–32)
Calcium: 10.1 mg/dL (ref 8.4–10.5)
Chloride: 104 mEq/L (ref 96–112)
Creatinine, Ser: 1.29 mg/dL (ref 0.40–1.50)
GFR: 55.99 mL/min — ABNORMAL LOW (ref >60.00)
Glucose, Bld: 95 mg/dL (ref 70–99)
Potassium: 4 mEq/L (ref 3.5–5.1)
Sodium: 136 mEq/L (ref 135–145)
Total Bilirubin: 0.7 mg/dL (ref 0.2–1.2)
Total Protein: 7.7 g/dL (ref 6.0–8.3)

## 2021-03-10 NOTE — Assessment & Plan Note (Signed)
Stable.  Compliant with CPAP treatment.

## 2021-03-10 NOTE — Patient Instructions (Signed)

## 2021-03-10 NOTE — Assessment & Plan Note (Signed)
Well-controlled hypertension.  Continue Benicar 20 mg daily and amlodipine 2.5 mg daily. BP Readings from Last 3 Encounters:  03/10/21 132/80  03/04/21 122/66  01/14/21 138/70  Dietary approaches to stop hypertension discussed.

## 2021-03-10 NOTE — Progress Notes (Signed)
Adam Peters 70 y.o.   Chief Complaint  Patient presents with   Hypertension    F/u    HISTORY OF PRESENT ILLNESS: This is a 70 y.o. male with history of hypertension here for follow-up. Doing well.  Has no complaints or medical concerns today. Has history of dyslipidemia and OSA on CPAP.  Hypertension Pertinent negatives include no chest pain, headaches, palpitations or shortness of breath.    Prior to Admission medications   Medication Sig Start Date End Date Taking? Authorizing Provider  alprostadil (EDEX) 20 MCG injection 20 mcg by Intracavitary route as needed for erectile dysfunction. use no more than 3 times per week   Yes [provider]  amLODipine (NORVASC) 2.5 MG tablet TAKE 1 TABLET DAILY 07/01/20  Yes [name], Nadean Corwin, MD  aspirin EC 81 MG tablet Take 81 mg by mouth daily.   Yes [provider]  atorvastatin (LIPITOR) 40 MG tablet TAKE 1 TABLET DAILY 05/21/20  Yes [name], Nadean Corwin, MD  azelastine (ASTELIN) 0.1 % nasal spray USE 2 SPRAYS IN EACH NOSTRIL TWICE A DAY AS NEEDED FOR RHINITIS AS DIRECTED (LAST FILL NEED APPOINTMENT) 08/01/20  Yes Valentina Shaggy, MD  buPROPion (WELLBUTRIN XL) 150 MG 24 hr tablet Take 150 mg by mouth every morning. 12/10/19  Yes [provider]  calcipotriene (DOVONOX) 0.005 % cream Apply topically 2 (two) times daily.   Yes [provider]  fexofenadine-pseudoephedrine (ALLEGRA-D 24) 180-240 MG 24 hr tablet Take 1 tablet by mouth daily.    Yes [provider]  fluticasone (FLONASE) 50 MCG/ACT nasal spray USE 2 SPRAYS IN EACH NOSTRIL DAILY 01/06/21  Yes Valentina Shaggy, MD  hydrocortisone 2.5 % cream Apply topically. 11/14/19  Yes [provider]  nitroGLYCERIN (NITROSTAT) 0.4 MG SL tablet DISSOLVE 1 TABLET UNDER THE TONGUE EVERY 5 MINUTES AS NEEDED FOR CHEST PAIN 11/08/19  Yes [name], Nadean Corwin, MD  olmesartan (BENICAR) 20 MG tablet TAKE 1 TABLET DAILY 09/06/20  Yes Horald Pollen, MD  omeprazole (PRILOSEC) 20 MG capsule TAKE 2 CAPSULES TWICE A DAY BEFORE MEALS 11/20/20  Yes [name], Nadean Corwin, MD  sildenafil (VIAGRA) 100 MG tablet TAKE 1 TABLET DAILY AS NEEDED FOR ERECTILE DYSFUNCTION (NOT TO USE MEDICATION WITH NITROGLYCERIN) 04/06/19  Yes [name], Nadean Corwin, MD  triamcinolone cream (KENALOG) 0.1 % Apply topically. 11/14/19  Yes [provider]    Allergies  Allergen Reactions   Amoxicillin Diarrhea    GI upset.    Patient Active Problem List   Diagnosis Date Noted   Asthma 10/03/2020   Seasonal and perennial allergic rhinitis 04/04/2019   Ex-smoker 04/04/2019   Mild persistent asthma, uncomplicated 96/75/9163   Essential hypertension    Hyperlipidemia    History of tobacco use 07/08/2012   Glaucoma 04/08/2012   DDD (degenerative disc disease), cervical    CAD- LAD BMS '97, RCA DES 08/02/13 08/17/2011    Past Medical History:  Diagnosis Date   Allergy    seasonal   Anemia    Anxiety    Asthma    CAD (coronary artery disease)    a. 1997 PCI/BMS to distal LAD (J&J PS BMS);  b. 03/2011 Neg MV;  c. 07/2013 NSTEMI/PCI: LM 20d, LAD 76m, 50d isr, LCX nl, OM1/2 nl, RCA dom 95-46m (3.5x16 Promus premier DES), EF 55%, mild basal inf HK.   Cataract    Coronary artery disease    s/p PCI to LAD (1997) - Dr. Rockne Menghini  GERD (gastroesophageal reflux disease)    Hepatitis C    History of tobacco use    Hyperlipidemia    Hypertension    Schizophrenia ([place]) 1978    Past Surgical History:  Procedure Laterality Date   [place]  2001   patent LAD stent (Dr Georges Lynch)   [place]  06/09/1995   prox LAD stenting 80% to 0% (Dr. Rockne Menghini) - repeat cath in 11/1999 showed no significant coronary disease & normal systolic function (Dr. Gerrie Nordmann)   CORONARY STENT PLACEMENT  08/02/2013   RCA   DES         DR [place]   knife wound repair  1969   LEFT HEART CATHETERIZATION WITH  CORONARY ANGIOGRAM N/A 08/02/2013   Procedure: LEFT HEART CATHETERIZATION WITH CORONARY ANGIOGRAM;  Surgeon: Blane Ohara, MD;  Location: [facility] C[facility] LAB;  Service: Cardiovascular;  Laterality: N/A;   TRANSTHORACIC ECHOCARDIOGRAM  11/13/2008   EF 50-55%, normal LV systolic function; RA mildly dilated; trace MR/TR/PR    Social History   Socioeconomic History   Marital status: Married    Spouse name: Not on file   Number of children: Not on file   Years of education: B.A.   Highest education level: Not on file  Occupational History   Occupation: retired    Fish farm manager: [employer]  Tobacco Use   Smoking status: Former    Packs/day: 1.00    Types: Cigarettes    Quit date: 05/18/1993    Years since quitting: 27.8   Smokeless tobacco: Never  Vaping Use   Vaping Use: Never used  Substance and Sexual Activity   Alcohol use: Yes    Alcohol/week: 3.0 standard drinks    Types: 3 Glasses of wine per week    Comment: SOCIAL   Drug use: No   Sexual activity: Not Currently  Other Topics Concern   Not on file  Social History Narrative   Cafffeine" occasional.  Education 4 yr college.  WorK: retired.     Social Determinants of Health   Financial Resource Strain: Low Risk    Difficulty of Paying Living Expenses: Not hard at all  Food Insecurity: No Food Insecurity   Worried About Charity fundraiser in the Last Year: Never true   [place] in the Last Year: Never true  Transportation Needs: No Transportation Needs   Lack of Transportation (Medical): No   Lack of Transportation (Non-Medical): No  Physical Activity: Sufficiently Active   Days of Exercise per Week: 5 days   Minutes of Exercise per Session: 30 min  Stress: No Stress Concern Present   Feeling of Stress : Not at all  Social Connections: Socially Integrated   Frequency of Communication with Friends and Family: More than three times a week   Frequency of Social Gatherings with Friends and Family: More than three times  a week   Attends Religious Services: More than 4 times per year   Active Member of [employer] or Organizations: Yes   Attends Music therapist: More than 4 times per year   Marital Status: Married  Human resources officer Violence: Not on file    Family History  Problem Relation Age of Onset   Asthma Mother    Crohn's disease Mother    Heart disease Mother    Hepatitis C Brother    Testicular cancer Brother    Dementia  Father    Diabetes Maternal Grandmother    Emphysema Maternal Aunt    Cancer Maternal Aunt    Colon cancer Neg Hx      Review of Systems  Constitutional: Negative.  Negative for chills and fever.  HENT: Negative.  Negative for congestion and sore throat.   Respiratory: Negative.  Negative for cough and shortness of breath.   Cardiovascular: Negative.  Negative for chest pain and palpitations.  Gastrointestinal: Negative.  Negative for abdominal pain, diarrhea, nausea and vomiting.  Genitourinary: Negative.  Negative for dysuria and hematuria.  Musculoskeletal: Negative.  Negative for joint pain and myalgias.  Skin: Negative.  Negative for rash.  Neurological: Negative.  Negative for dizziness and headaches.  All other systems reviewed and are negative.   Physical Exam Vitals reviewed.  Constitutional:      Appearance: Normal appearance.  HENT:     Head: Normocephalic.  Eyes:     Extraocular Movements: Extraocular movements intact.     Conjunctiva/sclera: Conjunctivae normal.     Pupils: Pupils are equal, round, and reactive to light.  Cardiovascular:     Rate and Rhythm: Normal rate and regular rhythm.     Pulses: Normal pulses.     Heart sounds: Normal heart sounds.  Pulmonary:     Effort: Pulmonary effort is normal.     Breath sounds: Normal breath sounds.  Musculoskeletal:        General: Normal range of motion.     Cervical back: Normal range of motion and neck supple.     Right lower leg: No edema.     Left lower leg: No edema.  Skin:     General: Skin is warm and dry.     Capillary Refill: Capillary refill takes less than 2 seconds.  Neurological:     General: No focal deficit present.     Mental Status: He is alert and oriented to person, place, and time.  Psychiatric:        Mood and Affect: Mood normal.        Behavior: Behavior normal.     ASSESSMENT & PLAN: Problem List Items Addressed This Visit       Cardiovascular and Mediastinum   Essential hypertension - Primary (Chronic)    Well-controlled hypertension.  Continue Benicar 20 mg daily and amlodipine 2.5 mg daily. BP Readings from Last 3 Encounters:  03/10/21 132/80  03/04/21 122/66  01/14/21 138/70  Dietary approaches to stop hypertension discussed.       Relevant Orders   CBC with Differential/Platelet   Comprehensive metabolic panel     Respiratory   OSA on CPAP    Stable.  Compliant with CPAP treatment.        Other   Dyslipidemia    Stable.  Lipid profile done today.  Continue atorvastatin 40 mg daily.      Relevant Orders   Lipid panel   Patient Instructions  Hypertension, Adult High blood pressure (hypertension) is when the force of blood pumping through the arteries is too strong. The arteries are the blood vessels that carry blood from the heart throughout the body. Hypertension forces the heart to work harder to pump blood and may cause arteries to become narrow or stiff. Untreated or uncontrolled hypertension can cause a heart attack, heart failure, a stroke, kidney disease, and other problems. A blood pressure Peters consists of a higher number over a lower number. Ideally, your blood pressure should be below 120/80. The first ("top") number is  called the systolic pressure. It is a measure of the pressure in your arteries as your heart beats. The second ("bottom") number is called the diastolic pressure. It is a measure of the pressure in your arteries as the heart relaxes. What are the causes? The exact cause of this condition is  not known. There are some conditions that result in or are related to high blood pressure. What increases the risk? Some risk factors for high blood pressure are under your control. The following factors may make you more likely to develop this condition: Smoking. Having type 2 diabetes mellitus, high cholesterol, or both. Not getting enough exercise or physical activity. Being overweight. Having too much fat, sugar, calories, or salt (sodium) in your diet. Drinking too much alcohol. Some risk factors for high blood pressure may be difficult or impossible to change. Some of these factors include: Having chronic kidney disease. Having a family history of high blood pressure. Age. Risk increases with age. Race. You may be at higher risk if you are African American. Gender. Men are at higher risk than women before age 55. After age 69, women are at higher risk than men. Having obstructive sleep apnea. Stress. What are the signs or symptoms? High blood pressure may not cause symptoms. Very high blood pressure (hypertensive crisis) may cause: Headache. Anxiety. Shortness of breath. Nosebleed. Nausea and vomiting. Vision changes. Severe chest pain. Seizures. How is this diagnosed? This condition is diagnosed by measuring your blood pressure while you are seated, with your arm resting on a flat surface, your legs uncrossed, and your feet flat on the floor. The cuff of the blood pressure monitor will be placed directly against the skin of your upper arm at the level of your heart. It should be measured at least twice using the same arm. Certain conditions can cause a difference in blood pressure between your right and left arms. Certain factors can cause blood pressure readings to be lower or higher than normal for a short period of time: When your blood pressure is higher when you are in a health care provider's office than when you are at home, this is called white coat hypertension. Most  people with this condition do not need medicines. When your blood pressure is higher at home than when you are in a health care provider's office, this is called masked hypertension. Most people with this condition may need medicines to control blood pressure. If you have a high blood pressure Peters during one visit or you have normal blood pressure with other risk factors, you may be asked to: Return on a different day to have your blood pressure checked again. Monitor your blood pressure at home for 1 week or longer. If you are diagnosed with hypertension, you may have other blood or imaging tests to help your health care provider understand your overall risk for other conditions. How is this treated? This condition is treated by making healthy lifestyle changes, such as eating healthy foods, exercising more, and reducing your alcohol intake. Your health care provider may prescribe medicine if lifestyle changes are not enough to get your blood pressure under control, and if: Your systolic blood pressure is above 130. Your diastolic blood pressure is above 80. Your personal target blood pressure may vary depending on your medical conditions, your age, and other factors. Follow these instructions at home: Eating and drinking  Eat a diet that is high in fiber and potassium, and low in sodium, added sugar, and fat. An  example eating plan is called the DASH (Dietary Approaches to Stop Hypertension) diet. To eat this way: Eat plenty of fresh fruits and vegetables. Try to fill one half of your plate at each meal with fruits and vegetables. Eat whole grains, such as whole-wheat pasta, brown rice, or whole-grain bread. Fill about one fourth of your plate with whole grains. Eat or drink low-fat dairy products, such as skim milk or low-fat yogurt. Avoid fatty cuts of meat, processed or cured meats, and poultry with skin. Fill about one fourth of your plate with lean proteins, such as fish, chicken without  skin, beans, eggs, or tofu. Avoid pre-made and processed foods. These tend to be higher in sodium, added sugar, and fat. Reduce your daily sodium intake. Most people with hypertension should eat less than 1,500 mg of sodium a day. Do not drink alcohol if: Your health care provider tells you not to drink. You are pregnant, may be pregnant, or are planning to become pregnant. If you drink alcohol: Limit how much you use to: 0-1 drink a day for women. 0-2 drinks a day for men. Be aware of how much alcohol is in your drink. In the [place] one drink equals one 12 oz bottle of beer (355 mL), one 5 oz glass of wine (148 mL), or one 1 oz glass of hard liquor (44 mL). Lifestyle  Work with your health care provider to maintain a healthy body weight or to lose weight. Ask what an ideal weight is for you. Get at least 30 minutes of exercise most days of the week. Activities may include walking, swimming, or biking. Include exercise to strengthen your muscles (resistance exercise), such as Pilates or lifting weights, as part of your weekly exercise routine. Try to do these types of exercises for 30 minutes at least 3 days a week. Do not use any products that contain nicotine or tobacco, such as cigarettes, e-cigarettes, and chewing tobacco. If you need help quitting, ask your health care provider. Monitor your blood pressure at home as told by your health care provider. Keep all follow-up visits as told by your health care provider. This is important. Medicines Take over-the-counter and prescription medicines only as told by your health care provider. Follow directions carefully. Blood pressure medicines must be taken as prescribed. Do not skip doses of blood pressure medicine. Doing this puts you at risk for problems and can make the medicine less effective. Ask your health care provider about side effects or reactions to medicines that you should watch for. Contact a health care provider if you: Think  you are having a reaction to a medicine you are taking. Have headaches that keep coming back (recurring). Feel dizzy. Have swelling in your ankles. Have trouble with your vision. Get help right away if you: Develop a severe headache or confusion. Have unusual weakness or numbness. Feel faint. Have severe pain in your chest or abdomen. Vomit repeatedly. Have trouble breathing. Summary Hypertension is when the force of blood pumping through your arteries is too strong. If this condition is not controlled, it may put you at risk for serious complications. Your personal target blood pressure may vary depending on your medical conditions, your age, and other factors. For most people, a normal blood pressure is less than 120/80. Hypertension is treated with lifestyle changes, medicines, or a combination of both. Lifestyle changes include losing weight, eating a healthy, low-sodium diet, exercising more, and limiting alcohol. This information is not intended to replace advice given to  you by your health care provider. Make sure you discuss any questions you have with your health care provider. Document Revised: 01/12/2018 Document Reviewed: 01/12/2018 Elsevier Patient Education  2022 [place], MD [facility]

## 2021-03-10 NOTE — Assessment & Plan Note (Signed)
Stable.  Lipid profile done today.  Continue atorvastatin 40 mg daily.

## 2021-03-11 DIAGNOSIS — H9042 Sensorineural hearing loss, unilateral, left ear, with unrestricted hearing on the contralateral side: Secondary | ICD-10-CM | POA: Diagnosis not present

## 2021-03-11 DIAGNOSIS — J309 Allergic rhinitis, unspecified: Secondary | ICD-10-CM | POA: Diagnosis not present

## 2021-03-11 DIAGNOSIS — H9313 Tinnitus, bilateral: Secondary | ICD-10-CM | POA: Diagnosis not present

## 2021-03-19 ENCOUNTER — Ambulatory Visit (INDEPENDENT_AMBULATORY_CARE_PROVIDER_SITE_OTHER): Payer: Medicare Other | Admitting: Neurology

## 2021-03-19 DIAGNOSIS — G4719 Other hypersomnia: Secondary | ICD-10-CM

## 2021-03-19 DIAGNOSIS — G4733 Obstructive sleep apnea (adult) (pediatric): Secondary | ICD-10-CM

## 2021-03-19 DIAGNOSIS — R351 Nocturia: Secondary | ICD-10-CM

## 2021-03-19 DIAGNOSIS — E663 Overweight: Secondary | ICD-10-CM

## 2021-03-20 NOTE — Progress Notes (Signed)
See procedure note.

## 2021-03-26 ENCOUNTER — Ambulatory Visit: Payer: Medicare Other | Admitting: Neurology

## 2021-03-26 NOTE — Procedures (Signed)
     [facility]  HOME SLEEP TEST (Watch PAT) REPORT  STUDY DATE: 03/19/2021  DOB: 1950/06/09  MRN: [id number]  ORDERING CLINICIAN: Star Age, MD, PhD   REFERRING CLINICIAN: Horald Pollen, MD   CLINICAL INFORMATION/HISTORY: 70 year old right-handed gentleman with an underlying medical history of coronary artery disease, hypertension, hyperlipidemia, Hx of hepatitis C, reflux disease, asthma, anemia, allergies, anxiety, and overweight state, who was previously diagnosed with obstructive sleep apnea.  Testing was done at the [place]. He is requesting reevaluation.  He is currently on a positive airway pressure device.  Epworth sleepiness score: 16/24.  BMI: 28.6 kg/m  FINDINGS:   Sleep Summary:   Total Recording Time (hours, min): 6 hours, 56 minutes  Total Sleep Time (hours, min):  6 hours, 17 minutes   Percent REM (%):    27.5%   Respiratory Indices:   Calculated pAHI (per hour):  13.4/hour         REM pAHI:    33/hour       NREM pAHI: 5.9/hour  Oxygen Saturation Statistics:    Oxygen Saturation (%) Mean: 94%   Minimum oxygen saturation (%):                 83%   O2 Saturation Range (%): 83-99%    O2 Saturation (minutes) <=88%: 1.9 min  Pulse Rate Statistics:   Pulse Mean (bpm):    64/min    Pulse Range (53-88/min)   IMPRESSION: OSA (obstructive sleep apnea), mild  RECOMMENDATION:   This home sleep test demonstrates overall mild obstructive sleep apnea with a total AHI of 13.4/hour and O2 nadir of 83%.  Intermittent mild to moderate snoring was detected.  Given that the patient has a working positive airway pressure device, he will be advised to continue with treatment and follow-up with his sleep specialist or primary care provider at the [place]. Please note that untreated obstructive sleep apnea may carry additional perioperative morbidity. Patients with significant obstructive sleep apnea should receive perioperative PAP therapy and  the surgeons and particularly the anesthesiologist should be informed of the diagnosis and the severity of the sleep disordered breathing. The patient should be cautioned not to drive, work at heights, or operate dangerous or heavy equipment when tired or sleepy. Review and reiteration of good sleep hygiene measures should be pursued with any patient. Other causes of the patient's symptoms, including circadian rhythm disturbances, an underlying mood disorder, medication effect and/or an underlying medical problem cannot be ruled out based on this test. Clinical correlation is recommended.   The patient and his referring provider will be notified of the test results. The patient will be seen in follow up in sleep clinic at [facility].  I certify that I have reviewed the raw data recording prior to the issuance of this report in accordance with the standards of the American Academy of Sleep Medicine (AASM).  INTERPRETING PHYSICIAN:   Star Age, MD, PhD  Board Certified in Neurology and Sleep Medicine  [facility] [address], [place], [place] [address] [phone]

## 2021-03-27 ENCOUNTER — Telehealth: Payer: Self-pay | Admitting: *Deleted

## 2021-03-27 NOTE — Telephone Encounter (Signed)
-----   Message from Star Age, MD sent at 03/26/2021  5:07 PM EST ----- Patient referred by Dr. Mitchel Honour for reevaluation of his obstructive sleep apnea.  He had prior testing through the [place] and requested reevaluation.  He underwent sleep testing 03/19/2021. Please call and notify the patient that the recent home sleep test showed obstructive sleep apnea in the mild range.  He has a CPAP or AutoPap device that was given to him through the [place].  He can continue with treatment and follow-up with his [place] specialist or primary care provider.  He reports that he uses his machine regularly and has benefited from treatment, machine is less than 50 years old and he is probably not eligible for a new machine.  I recommend he follow-up with the [place].  Star Age, MD, PhD [facility])

## 2021-03-27 NOTE — Telephone Encounter (Signed)
I called pt and relayed the HST results. And message recommendations per below by Dr. Rexene Alberts.  Still shows OSA mild.  [facility].  He verbalized understanding.  Wanted copy of sleep test. Will mail to him.

## 2021-03-31 NOTE — Telephone Encounter (Signed)
Went to mail copy of SS report.  Noted he was able to see on mychart.03-26-2021 5:23pm

## 2021-04-01 ENCOUNTER — Encounter: Payer: Self-pay | Admitting: Emergency Medicine

## 2021-04-01 NOTE — Telephone Encounter (Signed)
He needs to discuss this with a doctor who does social security disability evaluations.  Thanks.

## 2021-04-03 ENCOUNTER — Ambulatory Visit (INDEPENDENT_AMBULATORY_CARE_PROVIDER_SITE_OTHER): Payer: Medicare Other | Admitting: Allergy & Immunology

## 2021-04-03 ENCOUNTER — Other Ambulatory Visit: Payer: Self-pay

## 2021-04-03 VITALS — BP 124/70 | HR 85 | Temp 97.3°F | Resp 24 | Ht 74.0 in | Wt 219.0 lb

## 2021-04-03 DIAGNOSIS — I2510 Atherosclerotic heart disease of native coronary artery without angina pectoris: Secondary | ICD-10-CM

## 2021-04-03 DIAGNOSIS — J302 Other seasonal allergic rhinitis: Secondary | ICD-10-CM | POA: Diagnosis not present

## 2021-04-03 DIAGNOSIS — J3089 Other allergic rhinitis: Secondary | ICD-10-CM

## 2021-04-03 DIAGNOSIS — J4530 Mild persistent asthma, uncomplicated: Secondary | ICD-10-CM

## 2021-04-03 NOTE — Patient Instructions (Addendum)
1. Mild persistent asthma, uncomplicated - Lung testing looks great today. - We are going to have to try some other medication to help with your asthma control. - Start Breztri two puffs twice daily with spacer (contains THREE medications to help with your breathing). - Call us in two weeks with a report.  - Daily controller medication(s):  Breztri two puffs twice daily with spacer - Prior to physical activity: albuterol 2 puffs 10-15 minutes before physical activity. - Rescue medications: albuterol 4 puffs every 4-6 hours as needed - Asthma control goals:  * Full participation in all desired activities (may need albuterol before activity) * Albuterol use two time or less a week on average (not counting use with activity) * Cough interfering with sleep two time or less a month * Oral steroids no more than once a year * No hospitalizations  2. Seasonal and perennial allergic rhinitis (grasses, ragweed, trees, indoor molds, outdoor molds, cat and dog) - Continue with: Allegra (fexofenadine) 180mg  tablet once daily and Flonase (fluticasone) two sprays per nostril daily and Astelin (azelastine) 2 sprays per nostril 1-2 times daily. - Continue with salt water rinses before the nose sprays.  3. Return in about 3 months (around 07/04/2021).    Please inform us of any Emergency Department visits, hospitalizations, or changes in symptoms. Call us before going to the ED for breathing or allergy symptoms since we might be able to fit you in for a sick visit. Feel free to contact us anytime with any questions, problems, or concerns.  It was a pleasure to see you again today!  Websites that have reliable patient information: 1. American Academy of Asthma, Allergy, and Immunology: www.aaaai.org 2. Food Allergy Research and Education (FARE): foodallergy.org 3. Mothers of Asthmatics: http://www.asthmacommunitynetwork.org 4. American College of Allergy, Asthma, and Immunology: www.acaai.org   COVID-19  Vaccine Information can be found at: ShippingScam.co.uk For questions related to vaccine distribution or appointments, please email [email or web address] or call [phone].     "Like" [place] on Facebook and Instagram for our latest updates!       Make sure you are registered to vote! If you have moved or changed any of your contact information, you will need to get this updated before voting!  In some cases, you MAY be able to register to vote online: [email or web address]

## 2021-04-03 NOTE — Progress Notes (Addendum)
FOLLOW UP  Date of Service/Encounter:  04/03/21   Assessment:   Mild persistent asthma, uncomplicated - with worsening side effects from the Flovent, therefore changing to [facility] due to nighttime symptoms as well   Seasonal and perennial allergic rhinitis (grasses, ragweed, trees, indoor molds, outdoor molds, cat and dog)   Fully vaccinated to [place] lover   Plan/Recommendations:   1. Mild persistent asthma, uncomplicated - Lung testing looks great today. - We are going to have to try some other medication to help with your asthma control. - Start Breztri two puffs twice daily with spacer (contains THREE medications to help with your breathing). - Call us in two weeks with a report.  - Daily controller medication(s):  Breztri two puffs twice daily with spacer - Prior to physical activity: albuterol 2 puffs 10-15 minutes before physical activity. - Rescue medications: albuterol 4 puffs every 4-6 hours as needed - Asthma control goals:  * Full participation in all desired activities (may need albuterol before activity) * Albuterol use two time or less a week on average (not counting use with activity) * Cough interfering with sleep two time or less a month * Oral steroids no more than once a year * No hospitalizations  2. Seasonal and perennial allergic rhinitis (grasses, ragweed, trees, indoor molds, outdoor molds, cat and dog) - Continue with: Allegra (fexofenadine) 180mg  tablet once daily and Flonase (fluticasone) two sprays per nostril daily and Astelin (azelastine) 2 sprays per nostril 1-2 times daily. - Continue with salt water rinses before the nose sprays.  3. Return in about 3 months (around 07/04/2021).    Subjective:   Adam Peters is a 69 y.o. male presenting today for follow up of  Chief Complaint  Patient presents with   Asthma    Act - 19 Coughing up mucous (clear), has to take breaks during his exercise routines.    Adam Peters has a history of the following: Patient Active Problem List   Diagnosis Date Noted   OSA on CPAP 03/10/2021   Asthma 10/03/2020   Seasonal and perennial allergic rhinitis 04/04/2019   Ex-smoker 04/04/2019   Mild persistent asthma, uncomplicated 11/15/1599   Essential hypertension    Dyslipidemia    History of tobacco use 07/08/2012   Glaucoma 04/08/2012   DDD (degenerative disc disease), cervical    CAD- LAD BMS '97, RCA DES 08/02/13 08/17/2011    History obtained from: chart review and patient.  Adam Peters is a 70 y.o. male presenting for a follow up visit.  He was last seen in May 2022.  At that time, he was having nighttime coughing but did not want to change to any other medication.  We continued with Flovent 110 mcg 2 puffs twice daily as well as albuterol as needed.  For his rhinitis, we continued with Allegra as well as Flonase and Astelin.  Since last visit, he has done well.  Asthma/Respiratory Symptom History: He was using Flovent but he was developing hoarseness from that. He is using only the albuterol as needed. He  was started on Flovent in November 2020. On asking more questions, it seems that the Flovent was helping but he "could not take the side effects". The rapsiness is the big thing for him.   Allergic Rhinitis Symptom History: He remains on the Astelin.  He does not use the fluticasone.  He has been to ENT for evaluation of his "raspiness". He has been on omeprazole for a long  period of time , likely 1995 or so. He is on chronic aspirin use for anti-inflammatory issues.  We sent him to see PA Pietro Cassis at [facility].  Reviewed his note shows that he added Singulair 10 mg to his regimen.  He now sees ENT once a year.  He went to see neurology for a sleep apnea evaluation.  He still enjoys riding trains.  He goes to visit his mother who is in her 15s, 1 times a month and takes the train to [place] to do that.  Otherwise, there  have been no changes to his past medical history, surgical history, family history, or social history.    Review of Systems  Constitutional: Negative.  Negative for chills, fever, malaise/fatigue and weight loss.  HENT:  Positive for congestion. Negative for ear discharge, ear pain and sinus pain.   Eyes:  Negative for pain, discharge and redness.  Respiratory:  Negative for cough, sputum production, shortness of breath and wheezing.        Positive for hoarseness.  Cardiovascular: Negative.  Negative for chest pain and palpitations.  Gastrointestinal:  Negative for abdominal pain, constipation, diarrhea, heartburn, nausea and vomiting.  Skin: Negative.  Negative for itching and rash.  Neurological:  Negative for dizziness and headaches.  Endo/Heme/Allergies:  Positive for environmental allergies. Does not bruise/bleed easily.      Objective:   Blood pressure 124/70, pulse 85, temperature (!) 97.3 F (36.3 C), temperature source Temporal, resp. rate (!) 24, height 6\' 2"  (1.88 m), weight 219 lb (99.3 kg), SpO2 98 %. Body mass index is 28.12 kg/m.   Physical Exam:  Physical Exam Vitals reviewed.  Constitutional:      Appearance: He is well-developed.     Comments: Pleasant today.  HENT:     Head: Normocephalic and atraumatic.     Right Ear: Tympanic membrane, ear canal and external ear normal.     Left Ear: Tympanic membrane, ear canal and external ear normal.     Nose: No nasal deformity, septal deviation, mucosal edema or rhinorrhea.     Right Turbinates: Enlarged, swollen and pale.     Left Turbinates: Enlarged, swollen and pale.     Right Sinus: No maxillary sinus tenderness or frontal sinus tenderness.     Left Sinus: No maxillary sinus tenderness or frontal sinus tenderness.     Comments: No nasal polyps noted.    Mouth/Throat:     Mouth: Mucous membranes are not pale and not dry.     Pharynx: Uvula midline.  Eyes:     General: Lids are normal. Allergic shiner  present.        Right eye: No discharge.        Left eye: No discharge.     Conjunctiva/sclera: Conjunctivae normal.     Right eye: Right conjunctiva is not injected. No chemosis.    Left eye: Left conjunctiva is not injected. No chemosis.    Pupils: Pupils are equal, round, and reactive to light.  Cardiovascular:     Rate and Rhythm: Normal rate and regular rhythm.     Heart sounds: Normal heart sounds.  Pulmonary:     Effort: Pulmonary effort is normal. No tachypnea, accessory muscle usage or respiratory distress.     Breath sounds: Normal breath sounds. No wheezing, rhonchi or rales.     Comments: Moving air well in all lung fields. No increased work of breathing noted.  Chest:     Chest wall:  No tenderness.  Lymphadenopathy:     Cervical: No cervical adenopathy.  Skin:    General: Skin is warm.     Capillary Refill: Capillary refill takes less than 2 seconds.     Coloration: Skin is not pale.     Findings: No abrasion, erythema, petechiae or rash. Rash is not papular, urticarial or vesicular.     Comments: No eczematous or urticarial lesions noted.  Neurological:     Mental Status: He is alert.  Psychiatric:        Behavior: Behavior is cooperative.     Diagnostic studies:    Spirometry: results normal (FEV1: 2.34/81%, FVC: 3.44/90%, FEV1/FVC: 68%).    Spirometry consistent with normal pattern.   Allergy Studies: none       Salvatore Marvel, MD  [facility]

## 2021-04-04 ENCOUNTER — Encounter: Payer: Self-pay | Admitting: Allergy & Immunology

## 2021-04-07 ENCOUNTER — Encounter: Payer: Self-pay | Admitting: Allergy & Immunology

## 2021-04-07 ENCOUNTER — Encounter: Payer: Self-pay | Admitting: Internal Medicine

## 2021-04-08 DIAGNOSIS — Z20822 Contact with and (suspected) exposure to covid-19: Secondary | ICD-10-CM | POA: Diagnosis not present

## 2021-04-15 NOTE — Telephone Encounter (Signed)
Letter written and printed.  I was not sure how specific I need to be.  Can someone mail it to the patient?  Salvatore Marvel, MD [facility]

## 2021-05-01 ENCOUNTER — Encounter: Payer: Self-pay | Admitting: Allergy & Immunology

## 2021-05-01 ENCOUNTER — Other Ambulatory Visit: Payer: Self-pay | Admitting: *Deleted

## 2021-05-01 MED ORDER — BREZTRI AEROSPHERE 160-9-4.8 MCG/ACT IN AERO: 2.0000 | INHALATION_SPRAY | Freq: Two times a day (BID) | RESPIRATORY_TRACT | 1 refills | Status: DC

## 2021-05-01 NOTE — Telephone Encounter (Signed)
I assume he is talking about breztri

## 2021-05-06 ENCOUNTER — Other Ambulatory Visit: Payer: Self-pay | Admitting: Internal Medicine

## 2021-05-16 ENCOUNTER — Other Ambulatory Visit: Payer: Self-pay | Admitting: Internal Medicine

## 2021-05-22 ENCOUNTER — Encounter: Payer: Self-pay | Admitting: Internal Medicine

## 2021-05-30 ENCOUNTER — Encounter: Payer: Self-pay | Admitting: Emergency Medicine

## 2021-06-02 ENCOUNTER — Other Ambulatory Visit: Payer: Self-pay | Admitting: Emergency Medicine

## 2021-06-02 DIAGNOSIS — R399 Unspecified symptoms and signs involving the genitourinary system: Secondary | ICD-10-CM

## 2021-06-02 NOTE — Telephone Encounter (Signed)
Lower urinary tract symptoms, need urology evaluation. Referral placed today.

## 2021-06-05 ENCOUNTER — Ambulatory Visit: Payer: Medicare Other | Admitting: Neurology

## 2021-06-11 ENCOUNTER — Encounter: Payer: Self-pay | Admitting: Emergency Medicine

## 2021-06-11 ENCOUNTER — Ambulatory Visit (INDEPENDENT_AMBULATORY_CARE_PROVIDER_SITE_OTHER): Payer: Medicare Other | Admitting: Emergency Medicine

## 2021-06-11 ENCOUNTER — Other Ambulatory Visit: Payer: Self-pay

## 2021-06-11 VITALS — BP 136/70 | HR 79 | Ht 74.0 in | Wt 216.0 lb

## 2021-06-11 DIAGNOSIS — N3281 Overactive bladder: Secondary | ICD-10-CM

## 2021-06-11 DIAGNOSIS — R399 Unspecified symptoms and signs involving the genitourinary system: Secondary | ICD-10-CM

## 2021-06-11 MED ORDER — MIRABEGRON ER 25 MG PO TB24: 25.0000 mg | ORAL_TABLET | Freq: Every day | ORAL | 1 refills | Status: DC

## 2021-06-11 NOTE — Progress Notes (Signed)
Adam Peters 71 y.o.   Chief Complaint  Patient presents with   Urinary Frequency    Pt states that he can not hold his urine for a long time, he goes at least 4 times in 2 hour span, without fluid. A referral was placed to urology but they request office notes first before he can schedule OV.    HISTORY OF PRESENT ILLNESS: This is a 71 y.o. male complaining of urinary frequency for the past couple months.  Feels that he needs to go about 4 times in a 2-hour span.  Has nocturia 3-4 times per night.  Denies hematuria or burning.  No other associated symptoms.  Requesting urology referral.  Urinary Frequency  Associated symptoms include frequency. Pertinent negatives include no chills, nausea or vomiting.    Prior to Admission medications   Medication Sig Start Date End Date Taking? Authorizing Provider  alprostadil (EDEX) 20 MCG injection 20 mcg by Intracavitary route as needed for erectile dysfunction. use no more than 3 times per week   Yes [provider]  amLODipine (NORVASC) 2.5 MG tablet TAKE 1 TABLET DAILY 07/01/20  Yes [name], Nadean Corwin, MD  aspirin EC 81 MG tablet Take 81 mg by mouth daily.   Yes [provider]  atorvastatin (LIPITOR) 40 MG tablet TAKE 1 TABLET DAILY 05/16/21  Yes [name], Nadean Corwin, MD  azelastine (ASTELIN) 0.1 % nasal spray USE 2 SPRAYS IN EACH NOSTRIL TWICE A DAY AS NEEDED FOR RHINITIS AS DIRECTED (LAST FILL NEED APPOINTMENT) 08/01/20  Yes Valentina Shaggy, MD  BREZTRI AEROSPHERE 160-9-4.8 MCG/ACT AERO Inhale 2 puffs into the lungs in the morning and at bedtime. 05/01/21  Yes Valentina Shaggy, MD  buPROPion (WELLBUTRIN XL) 150 MG 24 hr tablet Take 150 mg by mouth every morning. 12/10/19  Yes [provider]  calcipotriene (DOVONOX) 0.005 % cream Apply topically 2 (two) times daily.   Yes [provider]  Cholecalciferol (VITAMIN D) 50 MCG (2000 UT) tablet Take by mouth. 12/03/20  Yes [provider]   fexofenadine-pseudoephedrine (ALLEGRA-D 24) 180-240 MG 24 hr tablet Take 1 tablet by mouth daily.    Yes [provider]  fluticasone (FLONASE) 50 MCG/ACT nasal spray USE 2 SPRAYS IN EACH NOSTRIL DAILY 01/06/21  Yes Valentina Shaggy, MD  hydrocortisone 2.5 % cream Apply topically. 11/14/19  Yes [provider]  lurasidone (LATUDA) 20 MG TABS tablet TAKE ONE TABLET BY MOUTH IN THE MORNING WITH BREAKFAST 03/24/21  Yes [provider]  nitroGLYCERIN (NITROSTAT) 0.4 MG SL tablet DISSOLVE 1 TABLET UNDER THE TONGUE EVERY 5 MINUTES AS NEEDED FOR CHEST PAIN 05/06/21  Yes [name], Nadean Corwin, MD  olmesartan (BENICAR) 20 MG tablet TAKE 1 TABLET DAILY 09/06/20  Yes Horald Pollen, MD  omeprazole (PRILOSEC) 20 MG capsule TAKE 2 CAPSULES TWICE A DAY BEFORE MEALS 11/20/20  Yes [name], Nadean Corwin, MD  sildenafil (VIAGRA) 100 MG tablet TAKE 1 TABLET DAILY AS NEEDED FOR ERECTILE DYSFUNCTION (NOT TO USE MEDICATION WITH NITROGLYCERIN) 04/06/19  Yes [name], Nadean Corwin, MD  triamcinolone cream (KENALOG) 0.1 % Apply topically. 11/14/19  Yes [provider]    Allergies  Allergen Reactions   Amoxicillin Diarrhea    GI upset.    Patient Active Problem List   Diagnosis Date Noted   OSA on CPAP 03/10/2021   Asthma 10/03/2020   Seasonal and perennial allergic rhinitis 04/04/2019   Ex-smoker 04/04/2019   Mild persistent asthma, uncomplicated 97/35/3299   Essential hypertension  Dyslipidemia    History of tobacco use 07/08/2012   Glaucoma 04/08/2012   DDD (degenerative disc disease), cervical    CAD- LAD BMS '97, RCA DES 08/02/13 08/17/2011    Past Medical History:  Diagnosis Date   Allergy    seasonal   Anemia    Anxiety    Asthma    CAD (coronary artery disease)    a. 1997 PCI/BMS to distal LAD (J&J PS BMS);  b. 03/2011 Neg MV;  c. 07/2013 NSTEMI/PCI: LM 20d, LAD 70m, 50d isr, LCX nl, OM1/2 nl, RCA dom 95-85m (3.5x16 Promus premier DES), EF 55%, mild basal inf HK.    Cataract    Coronary artery disease    s/p PCI to LAD (1997) - Dr. Rockne Menghini   GERD (gastroesophageal reflux disease)    Hepatitis C    History of tobacco use    Hyperlipidemia    Hypertension    Schizophrenia ([place]) 1978    Past Surgical History:  Procedure Laterality Date   [facility]  2001   patent LAD stent (Dr Georges Lynch)   [place]  06/09/1995   prox LAD stenting 80% to 0% (Dr. Rockne Menghini) - repeat cath in 11/1999 showed no significant coronary disease & normal systolic function (Dr. Gerrie Nordmann)   CORONARY STENT PLACEMENT  08/02/2013   RCA   DES         DR [place]   knife wound repair  [place] WITH CORONARY ANGIOGRAM N/A 08/02/2013   Procedure: LEFT HEART CATHETERIZATION WITH CORONARY ANGIOGRAM;  Surgeon: Blane Ohara, MD;  Location: [facility] CATH LAB;  Service: Cardiovascular;  Laterality: N/A;   TRANSTHORACIC ECHOCARDIOGRAM  11/13/2008   EF 50-55%, normal LV systolic function; RA mildly dilated; trace MR/TR/PR    Social History   Socioeconomic History   Marital status: Married    Spouse name: Not on file   Number of children: Not on file   Years of education: B.A.   Highest education level: Not on file  Occupational History   Occupation: retired    Fish farm manager: [employer]  Tobacco Use   Smoking status: Former    Packs/day: 1.00    Types: Cigarettes    Quit date: 05/18/1993    Years since quitting: 28.0   Smokeless tobacco: Never  Vaping Use   Vaping Use: Never used  Substance and Sexual Activity   Alcohol use: Yes    Alcohol/week: 3.0 standard drinks    Types: 3 Glasses of wine per week    Comment: SOCIAL   Drug use: No   Sexual activity: Not Currently  Other Topics Concern   Not on file  Social History Narrative   Cafffeine" occasional.  Education 4 yr college.  WorK: retired.     Social Determinants of Health   Financial Resource Strain: Low Risk     Difficulty of Paying Living Expenses: Not hard at all  Food Insecurity: No Food Insecurity   Worried About Charity fundraiser in the Last Year: Never true   [place] in the Last Year: Never true  Transportation Needs: No Transportation Needs   Lack of Transportation (Medical): No   Lack of Transportation (Non-Medical): No  Physical Activity: Sufficiently Active   Days of Exercise per Week: 5 days   Minutes of Exercise per Session: 30 min  Stress: No Stress Concern Present  Feeling of Stress : Not at all  Social Connections: Socially Integrated   Frequency of Communication with Friends and Family: More than three times a week   Frequency of Social Gatherings with Friends and Family: More than three times a week   Attends Religious Services: More than 4 times per year   Active Member of [employer] or Organizations: Yes   Attends Music therapist: More than 4 times per year   Marital Status: Married  Human resources officer Violence: Not on file    Family History  Problem Relation Age of Onset   Asthma Mother    Crohn's disease Mother    Heart disease Mother    Hepatitis C Brother    Testicular cancer Brother    Dementia Father    Diabetes Maternal Grandmother    Emphysema Maternal Aunt    Cancer Maternal Aunt    Colon cancer Neg Hx      Review of Systems  Constitutional: Negative.  Negative for chills and fever.  HENT: Negative.  Negative for congestion and sore throat.   Respiratory: Negative.  Negative for cough and shortness of breath.   Cardiovascular: Negative.  Negative for chest pain and palpitations.  Gastrointestinal: Negative.  Negative for abdominal pain, blood in stool, diarrhea, nausea and vomiting.  Genitourinary:  Positive for frequency.  Skin: Negative.  Negative for rash.  Neurological: Negative.  Negative for dizziness and headaches.  All other systems reviewed and are negative.  Today's Vitals   06/11/21 1442  BP: 136/70  Pulse: 79   SpO2: 97%  Weight: 216 lb (98 kg)  Height: 6\' 2"  (1.88 m)   Body mass index is 27.73 kg/m.  Physical Exam Vitals reviewed.  Constitutional:      Appearance: Normal appearance.  HENT:     Head: Normocephalic.  Eyes:     Extraocular Movements: Extraocular movements intact.     Pupils: Pupils are equal, round, and reactive to light.  Cardiovascular:     Rate and Rhythm: Normal rate.  Pulmonary:     Effort: Pulmonary effort is normal.  Abdominal:     Palpations: Abdomen is soft.     Tenderness: There is no abdominal tenderness.  Skin:    General: Skin is warm and dry.  Neurological:     General: No focal deficit present.     Mental Status: He is alert and oriented to person, place, and time.  Psychiatric:        Mood and Affect: Mood normal.        Behavior: Behavior normal.     ASSESSMENT & PLAN: A total of 30 minutes was spent with the patient and counseling/coordination of care regarding preparing for this visit, review of most recent office visit notes, review of all medications, differential diagnosis of lower urinary tract symptoms, diagnosis and management of overactive bladder, new medication and side effects, need for urology evaluation, prognosis, documentation and need for follow-up.  Problem List Items Addressed This Visit   None Visit Diagnoses     Overactive bladder    -  Primary   Relevant Medications   mirabegron ER (MYRBETRIQ) 25 MG TB24 tablet   Other Relevant Orders   Ambulatory referral to Urology   Lower urinary tract symptoms       Relevant Orders   Ambulatory referral to Urology      Patient Instructions  Overactive Bladder, Adult Overactive bladder is a condition in which a person has a sudden and frequent need to  urinate. A person might also leak urine if he or she cannot get to the bathroom fast enough (urinary incontinence). Sometimes, symptoms can interfere with work or social activities. What are the causes? Overactive bladder is  associated with poor nerve signals between your bladder and your brain. Your bladder may get the signal to empty before it is full. You may also have very sensitive muscles that make your bladder squeeze too soon. This condition may also be caused by other factors, such as: Medical conditions: Urinary tract infection. Infection of nearby tissues. Prostate enlargement. Bladder stones, inflammation, or tumors. Diabetes. Muscle or nerve weakness, especially from these conditions: A spinal cord injury. Stroke. Multiple sclerosis. Parkinson's disease. Other causes: Surgery on the uterus or urethra. Drinking too much caffeine or alcohol. Certain medicines, especially those that eliminate extra fluid in the body (diuretics). Constipation. What increases the risk? You may be at greater risk for overactive bladder if you: Are an older adult. Smoke. Are going through menopause. Have prostate problems. Have a neurological disease, such as stroke, dementia, Parkinson's disease, or multiple sclerosis (MS). Eat or drink alcohol, spicy food, caffeine, and other things that irritate the bladder. Are overweight or obese. What are the signs or symptoms? Symptoms of this condition include a sudden, strong urge to urinate. Other symptoms include: Leaking urine. Urinating 8 or more times a day. Waking up to urinate 2 or more times overnight. How is this diagnosed? This condition may be diagnosed based on: Your symptoms and medical history. A physical exam. Blood or urine tests to check for possible causes, such as infection. You may also need to see a health care provider who specializes in urinary tract problems. This is called a urologist. How is this treated? Treatment for overactive bladder depends on the cause of your condition and whether it is mild or severe. Treatment may include: Bladder training, such as: Learning to control the urge to urinate by following a schedule to urinate at  regular intervals. Doing Kegel exercises to strengthen the pelvic floor muscles that support your bladder. Special devices, such as: Biofeedback. This uses sensors to help you become aware of your body's signals. Electrical stimulation. This uses electrodes placed inside the body (implanted) or outside the body. These electrodes send gentle pulses of electricity to strengthen the nerves or muscles that control the bladder. Women may use a plastic device, called a pessary, that fits into the vagina and supports the bladder. Medicines, such as: Antibiotics to treat bladder infection. Antispasmodics to stop the bladder from releasing urine at the wrong time. Tricyclic antidepressants to relax bladder muscles. Injections of botulinum toxin type A directly into the bladder tissue to relax bladder muscles. Surgery, such as: A device may be implanted to help manage the nerve signals that control urination. An electrode may be implanted to stimulate electrical signals in the bladder. A procedure may be done to change the shape of the bladder. This is done only in very severe cases. Follow these instructions at home: Eating and drinking  Make diet or lifestyle changes recommended by your health care provider. These may include: Drinking fluids throughout the day and not only with meals. Cutting down on caffeine or alcohol. Eating a healthy and balanced diet to prevent constipation. This may include: Choosing foods that are high in fiber, such as beans, whole grains, and fresh fruits and vegetables. Limiting foods that are high in fat and processed sugars, such as fried and sweet foods. Lifestyle  Lose weight if needed.  Do not use any products that contain nicotine or tobacco. These include cigarettes, chewing tobacco, and vaping devices, such as e-cigarettes. If you need help quitting, ask your health care provider. General instructions Take over-the-counter and prescription medicines only as  told by your health care provider. If you were prescribed an antibiotic medicine, take it as told by your health care provider. Do not stop taking the antibiotic even if you start to feel better. Use any implants or pessary as told by your health care provider. If needed, wear pads to absorb urine leakage. Keep a log to track how much and when you drink, and when you need to urinate. This will help your health care provider monitor your condition. Keep all follow-up visits. This is important. Contact a health care provider if: You have a fever or chills. Your symptoms do not get better with treatment. Your pain and discomfort get worse. You have more frequent urges to urinate. Get help right away if: You are not able to control your bladder. Summary Overactive bladder refers to a condition in which a person has a sudden and frequent need to urinate. Several conditions may lead to an overactive bladder. Treatment for overactive bladder depends on the cause and severity of your condition. Making lifestyle changes, doing Kegel exercises, keeping a log, and taking medicines can help with this condition. This information is not intended to replace advice given to you by your health care provider. Make sure you discuss any questions you have with your health care provider. Document Revised: 01/22/2020 Document Reviewed: 01/22/2020 Elsevier Patient Education  2022 [place], MD [facility]

## 2021-06-11 NOTE — Patient Instructions (Signed)

## 2021-06-24 ENCOUNTER — Other Ambulatory Visit: Payer: Self-pay | Admitting: Internal Medicine

## 2021-06-27 ENCOUNTER — Other Ambulatory Visit: Payer: Self-pay | Admitting: Emergency Medicine

## 2021-06-27 ENCOUNTER — Telehealth: Payer: Self-pay

## 2021-06-27 DIAGNOSIS — N3281 Overactive bladder: Secondary | ICD-10-CM

## 2021-06-27 DIAGNOSIS — R399 Unspecified symptoms and signs involving the genitourinary system: Secondary | ICD-10-CM

## 2021-06-27 MED ORDER — OXYBUTYNIN CHLORIDE ER 5 MG PO TB24: 5.0000 mg | ORAL_TABLET | Freq: Every day | ORAL | 1 refills | Status: AC

## 2021-06-27 NOTE — Telephone Encounter (Signed)
OK, will switch to Oxybutynin ER 5 mg daily. Thanks.

## 2021-06-27 NOTE — Telephone Encounter (Signed)
Patient was prescribed Myrbetriq 25 mg for overactive bladder. Patient's insurance denied coverage for medication. They will cover Oxybutynin IR/ER, Trospium, Solifenacin, or Daifenacin. Can medication be changed?

## 2021-06-30 NOTE — Telephone Encounter (Signed)
Sent pt a mychart message in regards to medication change.

## 2021-07-03 ENCOUNTER — Ambulatory Visit (INDEPENDENT_AMBULATORY_CARE_PROVIDER_SITE_OTHER): Payer: Medicare Other | Admitting: Allergy & Immunology

## 2021-07-03 ENCOUNTER — Encounter: Payer: Self-pay | Admitting: Emergency Medicine

## 2021-07-03 ENCOUNTER — Other Ambulatory Visit: Payer: Self-pay | Admitting: Allergy & Immunology

## 2021-07-03 ENCOUNTER — Other Ambulatory Visit: Payer: Self-pay

## 2021-07-03 VITALS — BP 140/88 | HR 81 | Temp 98.4°F | Resp 18 | Ht 74.0 in | Wt 218.2 lb

## 2021-07-03 DIAGNOSIS — J4540 Moderate persistent asthma, uncomplicated: Secondary | ICD-10-CM | POA: Diagnosis not present

## 2021-07-03 DIAGNOSIS — J302 Other seasonal allergic rhinitis: Secondary | ICD-10-CM

## 2021-07-03 DIAGNOSIS — R3914 Feeling of incomplete bladder emptying: Secondary | ICD-10-CM | POA: Diagnosis not present

## 2021-07-03 DIAGNOSIS — J3089 Other allergic rhinitis: Secondary | ICD-10-CM

## 2021-07-03 DIAGNOSIS — R350 Frequency of micturition: Secondary | ICD-10-CM | POA: Diagnosis not present

## 2021-07-03 DIAGNOSIS — N401 Enlarged prostate with lower urinary tract symptoms: Secondary | ICD-10-CM | POA: Diagnosis not present

## 2021-07-03 NOTE — Patient Instructions (Addendum)
1. Moderate persistent asthma, uncomplicated - Lung testing looks great today. - I am going to get some lab work to see if you would qualify for an injectable medication to help manage your asthma.  - Daily controller medication(s):  Breztri two puffs twice daily with spacer - Prior to physical activity: albuterol 2 puffs 10-15 minutes before physical activity. - Rescue medications: albuterol 4 puffs every 4-6 hours as needed - Asthma control goals:  * Full participation in all desired activities (may need albuterol before activity) * Albuterol use two time or less a week on average (not counting use with activity) * Cough interfering with sleep two time or less a month * Oral steroids no more than once a year * No hospitalizations  2. Seasonal and perennial allergic rhinitis (grasses, ragweed, trees, indoor molds, outdoor molds, cat and dog) - Continue with: Allegra (fexofenadine) 180mg  tablet once daily and Flonase (fluticasone) two sprays per nostril daily and Astelin (azelastine) 2 sprays per nostril 1-2 times daily. - Continue with salt water rinses before the nose sprays. - Allergy shots might be the best treatment option in the long run since that can decrease mucous production.   3. Return in about 6 months (around 12/31/2021).    Please inform us of any Emergency Department visits, hospitalizations, or changes in symptoms. Call us before going to the ED for breathing or allergy symptoms since we might be able to fit you in for a sick visit. Feel free to contact us anytime with any questions, problems, or concerns.  It was a pleasure to see you again today!  Websites that have reliable patient information: 1. American Academy of Asthma, Allergy, and Immunology: www.aaaai.org 2. Food Allergy Research and Education (FARE): foodallergy.org 3. Mothers of Asthmatics: http://www.asthmacommunitynetwork.org 4. American College of Allergy, Asthma, and Immunology:  www.acaai.org   COVID-19 Vaccine Information can be found at: ShippingScam.co.uk For questions related to vaccine distribution or appointments, please email [email or web address] or call [phone].     Like [place] on [place] and Instagram for our latest updates!       Make sure you are registered to vote! If you have moved or changed any of your contact information, you will need to get this updated before voting!  In some cases, you MAY be able to register to vote online: [email or web address]

## 2021-07-03 NOTE — Progress Notes (Signed)
FOLLOW UP  Date of Service/Encounter:  07/03/21   Assessment:   Mild persistent asthma, uncomplicated - with worsening side effects from the Flovent, therefore changing to [name] due to nighttime symptoms as well   Seasonal and perennial allergic rhinitis (grasses, ragweed, trees, indoor molds, outdoor molds, cat and dog)   Fully vaccinated to [name]   Plan/Recommendations:   1. Moderate persistent asthma, uncomplicated - Lung testing looks great today. - I am going to get some lab work to see if you would qualify for an injectable medication to help manage your asthma.  - Daily controller medication(s):  Breztri two puffs twice daily with spacer - Prior to physical activity: albuterol 2 puffs 10-15 minutes before physical activity. - Rescue medications: albuterol 4 puffs every 4-6 hours as needed - Asthma control goals:  * Full participation in all desired activities (may need albuterol before activity) * Albuterol use two time or less a week on average (not counting use with activity) * Cough interfering with sleep two time or less a month * Oral steroids no more than once a year * No hospitalizations  2. Seasonal and perennial allergic rhinitis (grasses, ragweed, trees, indoor molds, outdoor molds, cat and dog) - Continue with: Allegra (fexofenadine) 180mg  tablet once daily and Flonase (fluticasone) two sprays per nostril daily and Astelin (azelastine) 2 sprays per nostril 1-2 times daily. - Continue with salt water rinses before the nose sprays. - Allergy shots might be the best treatment option in the long run since that can decrease mucous production.   3. Return in about 6 months (around 12/31/2021).   Subjective:   Adam Peters is a 71 y.o. male presenting today for follow up of  Chief Complaint  Patient presents with   Asthma    3 mth f/u - doing fine    Adam Peters has a history of the following: Patient Active Problem List    Diagnosis Date Noted   Moderate persistent asthma, uncomplicated 32/44/0102   OSA on CPAP 03/10/2021   Asthma 10/03/2020   Seasonal and perennial allergic rhinitis 04/04/2019   Ex-smoker 04/04/2019   Mild persistent asthma, uncomplicated 72/53/6644   Essential hypertension    Dyslipidemia    History of tobacco use 07/08/2012   Glaucoma 04/08/2012   DDD (degenerative disc disease), cervical    CAD- LAD BMS '97, RCA DES 08/02/13 08/17/2011    History obtained from: chart review and patient.  Adam Peters is a 71 y.o. male presenting for a follow up visit.  He was last seen in November 2022.  At that time, we added on Breztri.  We continued with albuterol as needed. For his allergic rhinitis, we continued with Allegra as well as Flonase and Astelin. We also continued with salt water rinses.  In the interim, he did contact us back and finally agreed to continue with the [name].  We had previously only given him samples.   Asthma/Respiratory Symptom History: He reports that it comes and goes. It has calmed itself down. He does feel that the Judithann Sauger has allowed him to do more with regards to his breathing. He is drinking a lot of water to deal with the thu . He was a smoker for 25 years. Lung function has been normal, however.  Allergic Rhinitis Symptom History: He is having a lot of mucous production. He is not interested in allergy shots since he is visiting his mother in [place], [place] very often. He has not needed  antibiotics at all for any sinus infection.  He is using his nose sprays somewhat consistently.   Otherwise, there have been no changes to his past medical history, surgical history, family history, or social history.    Review of Systems  Constitutional: Negative.  Negative for chills, fever, malaise/fatigue and weight loss.  HENT:  Positive for congestion. Negative for ear discharge and ear pain.   Eyes:  Negative for pain, discharge and redness.  Respiratory:  Positive for  sputum production. Negative for cough, shortness of breath and wheezing.   Cardiovascular: Negative.  Negative for chest pain and palpitations.  Gastrointestinal:  Negative for abdominal pain, constipation, diarrhea, heartburn, nausea and vomiting.  Skin: Negative.  Negative for itching and rash.  Neurological:  Negative for dizziness and headaches.  Endo/Heme/Allergies:  Negative for environmental allergies. Does not bruise/bleed easily.      Objective:   Blood pressure 140/88, pulse 81, temperature 98.4 F (36.9 C), resp. rate 18, height 6\' 2"  (1.88 m), weight 218 lb 3.2 oz (99 kg), SpO2 98 %. Body mass index is 28.02 kg/m.   Physical Exam Vitals reviewed.  Constitutional:      Appearance: He is well-developed.     Comments: Pleasant today.  HENT:     Head: Normocephalic and atraumatic.     Right Ear: Tympanic membrane, ear canal and external ear normal.     Left Ear: Tympanic membrane, ear canal and external ear normal.     Nose: No nasal deformity, septal deviation, mucosal edema or rhinorrhea.     Right Turbinates: Enlarged, swollen and pale.     Left Turbinates: Enlarged, swollen and pale.     Right Sinus: No maxillary sinus tenderness or frontal sinus tenderness.     Left Sinus: No maxillary sinus tenderness or frontal sinus tenderness.     Comments: No nasal polyps noted.    Mouth/Throat:     Mouth: Mucous membranes are not pale and not dry.     Pharynx: Uvula midline.  Eyes:     General: Lids are normal. Allergic shiner present.        Right eye: No discharge.        Left eye: No discharge.     Conjunctiva/sclera: Conjunctivae normal.     Right eye: Right conjunctiva is not injected. No chemosis.    Left eye: Left conjunctiva is not injected. No chemosis.    Pupils: Pupils are equal, round, and reactive to light.  Cardiovascular:     Rate and Rhythm: Normal rate and regular rhythm.     Heart sounds: Normal heart sounds.  Pulmonary:     Effort: Pulmonary effort  is normal. No tachypnea, accessory muscle usage or respiratory distress.     Breath sounds: Normal breath sounds. No wheezing, rhonchi or rales.     Comments: Moving air well in all lung fields. No increased work of breathing noted.  Chest:     Chest wall: No tenderness.  Lymphadenopathy:     Cervical: No cervical adenopathy.  Skin:    General: Skin is warm.     Capillary Refill: Capillary refill takes less than 2 seconds.     Coloration: Skin is not pale.     Findings: No abrasion, erythema, petechiae or rash. Rash is not papular, urticarial or vesicular.     Comments: No eczematous or urticarial lesions noted.  Neurological:     Mental Status: He is alert.  Psychiatric:        Behavior: Behavior  is cooperative.     Diagnostic studies:    Spirometry: results normal (FEV1: 2.77/96%, FVC: 4.29/113%, FEV1/FVC: 65%).    Spirometry consistent with normal pattern.   Allergy Studies: none       Salvatore Marvel, MD  [facility]

## 2021-07-04 ENCOUNTER — Telehealth: Payer: Self-pay | Admitting: Emergency Medicine

## 2021-07-04 NOTE — Telephone Encounter (Signed)
What is the name of the medication that he was prescribed by the urologist?  Urologist office visit notes not available for my review at the present time.

## 2021-07-04 NOTE — Telephone Encounter (Signed)
Pt takes he hs only taken 1 dose of oxybutynin (DITROPAN-XL) 5 MG 24 hr tablet on 2-15, pt states urologist informed him he had an enlarged prostate and that could be the reason he urinates a lot  Pt has been prescribed medication for the enlarged prostate and inquiring if he should continue taking oxybutynin (DITROPAN-XL) 5 MG 24 hr tablet

## 2021-07-04 NOTE — Telephone Encounter (Signed)
Please find out about medication started by urologist.  And did the urologist say anything about oxybutynin?  Thanks.

## 2021-07-05 ENCOUNTER — Other Ambulatory Visit: Payer: Self-pay | Admitting: Allergy & Immunology

## 2021-07-06 ENCOUNTER — Encounter: Payer: Self-pay | Admitting: Allergy & Immunology

## 2021-07-08 NOTE — Telephone Encounter (Signed)
He can take them together but I think he should also ask his urologist about this.  Thanks.

## 2021-07-09 ENCOUNTER — Encounter: Payer: Self-pay | Admitting: Allergy & Immunology

## 2021-07-09 LAB — CBC WITH DIFFERENTIAL/PLATELET
Basophils Absolute: 0 10*3/uL (ref 0.0–0.2)
Basos: 0 %
EOS (ABSOLUTE): 0.1 10*3/uL (ref 0.0–0.4)
Eos: 1 %
Hematocrit: 37.9 % (ref 37.5–51.0)
Hemoglobin: 12.4 g/dL — ABNORMAL LOW (ref 13.0–17.7)
Immature Grans (Abs): 0 10*3/uL (ref 0.0–0.1)
Immature Granulocytes: 0 %
Lymphocytes Absolute: 1.5 10*3/uL (ref 0.7–3.1)
Lymphs: 27 %
MCH: 30.3 pg (ref 26.6–33.0)
MCHC: 32.7 g/dL (ref 31.5–35.7)
MCV: 93 fL (ref 79–97)
Monocytes Absolute: 0.5 10*3/uL (ref 0.1–0.9)
Monocytes: 9 %
Neutrophils Absolute: 3.6 10*3/uL (ref 1.4–7.0)
Neutrophils: 63 %
Platelets: 200 10*3/uL (ref 150–450)
RBC: 4.09 x10E6/uL — ABNORMAL LOW (ref 4.14–5.80)
RDW: 11.5 % — ABNORMAL LOW (ref 11.6–15.4)
WBC: 5.7 10*3/uL (ref 3.4–10.8)

## 2021-07-09 LAB — IGE: IgE (Immunoglobulin E), Serum: 53 IU/mL (ref 6–495)

## 2021-07-11 ENCOUNTER — Encounter: Payer: Self-pay | Admitting: Allergy & Immunology

## 2021-07-14 NOTE — Telephone Encounter (Signed)
Called patient and advised Xolair for asthma buy and bill and noted in result to Dr Ernst Bowler. Pt scheduled for start 2/28

## 2021-07-15 ENCOUNTER — Ambulatory Visit: Payer: Medicare Other

## 2021-07-17 ENCOUNTER — Other Ambulatory Visit: Payer: Self-pay

## 2021-07-17 ENCOUNTER — Encounter: Payer: Self-pay | Admitting: Internal Medicine

## 2021-07-17 ENCOUNTER — Ambulatory Visit (INDEPENDENT_AMBULATORY_CARE_PROVIDER_SITE_OTHER): Payer: Medicare Other | Admitting: Internal Medicine

## 2021-07-17 VITALS — BP 135/64 | HR 61 | Ht 74.0 in | Wt 218.0 lb

## 2021-07-17 DIAGNOSIS — I2510 Atherosclerotic heart disease of native coronary artery without angina pectoris: Secondary | ICD-10-CM | POA: Diagnosis not present

## 2021-07-17 DIAGNOSIS — E785 Hyperlipidemia, unspecified: Secondary | ICD-10-CM | POA: Diagnosis not present

## 2021-07-17 DIAGNOSIS — I10 Essential (primary) hypertension: Secondary | ICD-10-CM

## 2021-07-17 NOTE — Patient Instructions (Signed)
Medication Instructions:  ?Your physician recommends that you continue on your current medications as directed. Please refer to the Current Medication list given to you today. ? ?*If you need a refill on your cardiac medications before your next appointment, please call your pharmacy* ? ? ?Follow-Up: ?At [facility], you and your health needs are our priority.  As part of our continuing mission to provide you with exceptional heart care, we have created designated Provider Care Teams.  These Care Teams include your primary Cardiologist (physician) and Advanced Practice Providers (APPs -  Physician Assistants and Nurse Practitioners) who all work together to provide you with the care you need, when you need it. ? ?We recommend signing up for the patient portal called "MyChart".  Sign up information is provided on this After Visit Summary.  MyChart is used to connect with patients for Virtual Visits (Telemedicine).  Patients are able to view lab/test results, encounter notes, upcoming appointments, etc.  Non-urgent messages can be sent to your provider as well.   ?To learn more about what you can do with MyChart, go to [email or web address].   ? ?Your next appointment:   ?1 year with Dr. Debara Pickett  ? ?
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
## 2021-07-25 ENCOUNTER — Other Ambulatory Visit: Payer: Self-pay | Admitting: Internal Medicine

## 2021-08-04 ENCOUNTER — Other Ambulatory Visit: Payer: Self-pay | Admitting: Emergency Medicine

## 2021-08-04 DIAGNOSIS — I10 Essential (primary) hypertension: Secondary | ICD-10-CM

## 2021-08-13 DIAGNOSIS — R350 Frequency of micturition: Secondary | ICD-10-CM | POA: Diagnosis not present

## 2021-08-13 DIAGNOSIS — R3914 Feeling of incomplete bladder emptying: Secondary | ICD-10-CM | POA: Diagnosis not present

## 2021-08-13 DIAGNOSIS — N401 Enlarged prostate with lower urinary tract symptoms: Secondary | ICD-10-CM | POA: Diagnosis not present

## 2021-08-14 ENCOUNTER — Other Ambulatory Visit: Payer: Self-pay | Admitting: Internal Medicine

## 2021-08-27 ENCOUNTER — Ambulatory Visit: Payer: Medicare Other

## 2021-08-28 ENCOUNTER — Ambulatory Visit: Payer: Medicare Other

## 2021-08-28 ENCOUNTER — Telehealth: Payer: Self-pay

## 2021-08-28 NOTE — Telephone Encounter (Signed)
Called and spoke to patient to see if he was coming for his [place] appointment. Patient stated that he was declining on the Xolair as he has had second thoughts. Patient stated that he will speak to Dr. Ernst Bowler at a later time to discuss his Xolair and the other medications he is currently taking.  ?

## 2021-08-28 NOTE — Telephone Encounter (Signed)
Ok sounds good.  ? ?Salvatore Marvel, MD ?[facility] [place] [facility] ? ?

## 2021-09-03 DIAGNOSIS — Z20822 Contact with and (suspected) exposure to covid-19: Secondary | ICD-10-CM | POA: Diagnosis not present

## 2021-09-08 ENCOUNTER — Encounter: Payer: Self-pay | Admitting: Emergency Medicine

## 2021-09-08 ENCOUNTER — Ambulatory Visit (INDEPENDENT_AMBULATORY_CARE_PROVIDER_SITE_OTHER): Payer: Medicare Other | Admitting: Emergency Medicine

## 2021-09-08 VITALS — BP 110/72 | HR 83 | Temp 98.1°F | Ht 74.0 in | Wt 218.0 lb

## 2021-09-08 DIAGNOSIS — E785 Hyperlipidemia, unspecified: Secondary | ICD-10-CM | POA: Diagnosis not present

## 2021-09-08 DIAGNOSIS — I10 Essential (primary) hypertension: Secondary | ICD-10-CM | POA: Diagnosis not present

## 2021-09-08 DIAGNOSIS — N138 Other obstructive and reflux uropathy: Secondary | ICD-10-CM | POA: Diagnosis not present

## 2021-09-08 DIAGNOSIS — J4540 Moderate persistent asthma, uncomplicated: Secondary | ICD-10-CM

## 2021-09-08 DIAGNOSIS — N401 Enlarged prostate with lower urinary tract symptoms: Secondary | ICD-10-CM | POA: Diagnosis not present

## 2021-09-08 DIAGNOSIS — I2510 Atherosclerotic heart disease of native coronary artery without angina pectoris: Secondary | ICD-10-CM | POA: Diagnosis not present

## 2021-09-08 LAB — LIPID PANEL
Cholesterol: 128 mg/dL (ref 0–200)
HDL: 59 mg/dL (ref >39.00)
LDL Cholesterol: 57 mg/dL (ref 0–99)
NonHDL: 69.06
Total CHOL/HDL Ratio: 2
Triglycerides: 61 mg/dL (ref 0.0–149.0)
VLDL: 12.2 mg/dL (ref 0.0–40.0)

## 2021-09-08 LAB — COMPREHENSIVE METABOLIC PANEL
ALT: 21 U/L (ref 0–53)
AST: 19 U/L (ref 0–37)
Albumin: 4.2 g/dL (ref 3.5–5.2)
Alkaline Phosphatase: 71 U/L (ref 39–117)
BUN: 14 mg/dL (ref 6–23)
CO2: 26 mEq/L (ref 19–32)
Calcium: 10.3 mg/dL (ref 8.4–10.5)
Chloride: 105 mEq/L (ref 96–112)
Creatinine, Ser: 1.37 mg/dL (ref 0.40–1.50)
GFR: 51.9 mL/min — ABNORMAL LOW (ref >60.00)
Glucose, Bld: 118 mg/dL — ABNORMAL HIGH (ref 70–99)
Potassium: 4.4 mEq/L (ref 3.5–5.1)
Sodium: 138 mEq/L (ref 135–145)
Total Bilirubin: 0.6 mg/dL (ref 0.2–1.2)
Total Protein: 7.5 g/dL (ref 6.0–8.3)

## 2021-09-08 NOTE — Assessment & Plan Note (Signed)
Stable.  Continue atorvastatin 40 mg daily. ° °
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
## 2021-09-08 NOTE — Assessment & Plan Note (Signed)
Well-controlled on Breztri twice a day. ?

## 2021-09-08 NOTE — Assessment & Plan Note (Signed)
Well-controlled hypertension.  Continue olmesartan 20 mg daily. ?Stop amlodipine 2.5 mg. ?BP Readings from Last 3 Encounters:  ?09/08/21 110/72  ?07/17/21 135/64  ?07/03/21 140/88  ? ? ?

## 2021-09-08 NOTE — Patient Instructions (Signed)
Health Maintenance After Age 72 After age 72, you are at a higher risk for certain long-term diseases and infections as well as injuries from falls. Falls are a major cause of broken bones and head injuries in people who are older than age 72. Getting regular preventive care can help to keep you healthy and well. Preventive care includes getting regular testing and making lifestyle changes as recommended by your health care provider. Talk with your health care provider about: Which screenings and tests you should have. A screening is a test that checks for a disease when you have no symptoms. A diet and exercise plan that is right for you. What should I know about screenings and tests to prevent falls? Screening and testing are the best ways to find a health problem early. Early diagnosis and treatment give you the best chance of managing medical conditions that are common after age 72. Certain conditions and lifestyle choices may make you more likely to have a fall. Your health care provider may recommend: Regular vision checks. Poor vision and conditions such as cataracts can make you more likely to have a fall. If you wear glasses, make sure to get your prescription updated if your vision changes. Medicine review. Work with your health care provider to regularly review all of the medicines you are taking, including over-the-counter medicines. Ask your health care provider about any side effects that may make you more likely to have a fall. Tell your health care provider if any medicines that you take make you feel dizzy or sleepy. Strength and balance checks. Your health care provider may recommend certain tests to check your strength and balance while standing, walking, or changing positions. Foot health exam. Foot pain and numbness, as well as not wearing proper footwear, can make you more likely to have a fall. Screenings, including: Osteoporosis screening. Osteoporosis is a condition that causes  the bones to get weaker and break more easily. Blood pressure screening. Blood pressure changes and medicines to control blood pressure can make you feel dizzy. Depression screening. You may be more likely to have a fall if you have a fear of falling, feel depressed, or feel unable to do activities that you used to do. Alcohol use screening. Using too much alcohol can affect your balance and may make you more likely to have a fall. Follow these instructions at home: Lifestyle Do not drink alcohol if: Your health care provider tells you not to drink. If you drink alcohol: Limit how much you have to: 0-1 drink a day for women. 0-2 drinks a day for men. Know how much alcohol is in your drink. In the [place] one drink equals one 12 oz bottle of beer (355 mL), one 5 oz glass of wine (148 mL), or one 1 oz glass of hard liquor (44 mL). Do not use any products that contain nicotine or tobacco. These products include cigarettes, chewing tobacco, and vaping devices, such as e-cigarettes. If you need help quitting, ask your health care provider. Activity  Follow a regular exercise program to stay fit. This will help you maintain your balance. Ask your health care provider what types of exercise are appropriate for you. If you need a cane or walker, use it as recommended by your health care provider. Wear supportive shoes that have nonskid soles. Safety  Remove any tripping hazards, such as rugs, cords, and clutter. Install safety equipment such as grab bars in bathrooms and safety rails on stairs. Keep rooms and walkways   well-lit. General instructions Talk with your health care provider about your risks for falling. Tell your health care provider if: You fall. Be sure to tell your health care provider about all falls, even ones that seem minor. You feel dizzy, tiredness (fatigue), or off-balance. Take over-the-counter and prescription medicines only as told by your health care provider. These include  supplements. Eat a healthy diet and maintain a healthy weight. A healthy diet includes low-fat dairy products, low-fat (lean) meats, and fiber from whole grains, beans, and lots of fruits and vegetables. Stay current with your vaccines. Schedule regular health, dental, and eye exams. Summary Having a healthy lifestyle and getting preventive care can help to protect your health and wellness after age 72. Screening and testing are the best way to find a health problem early and help you avoid having a fall. Early diagnosis and treatment give you the best chance for managing medical conditions that are more common for people who are older than age 72. Falls are a major cause of broken bones and head injuries in people who are older than age 72. Take precautions to prevent a fall at home. Work with your health care provider to learn what changes you can make to improve your health and wellness and to prevent falls. This information is not intended to replace advice given to you by your health care provider. Make sure you discuss any questions you have with your health care provider. Document Revised: 09/23/2020 Document Reviewed: 09/23/2020 Elsevier Patient Education  2023 Elsevier Inc.  

## 2021-09-08 NOTE — Assessment & Plan Note (Signed)
Stable.  Much improved with Flomax 0.4 mg at bedtime. ?Saw urologist recently. ?

## 2021-09-15 ENCOUNTER — Telehealth: Payer: Self-pay | Admitting: Emergency Medicine

## 2021-09-15 ENCOUNTER — Other Ambulatory Visit: Payer: Self-pay | Admitting: Internal Medicine

## 2021-09-15 DIAGNOSIS — I10 Essential (primary) hypertension: Secondary | ICD-10-CM

## 2021-09-15 MED ORDER — OLMESARTAN MEDOXOMIL 20 MG PO TABS: 20.0000 mg | ORAL_TABLET | Freq: Every day | ORAL | 1 refills | Status: DC

## 2021-09-15 NOTE — Telephone Encounter (Signed)
1.Medication Requested: olmesartan (BENICAR) 20 MG tablet ? ?2. Pharmacy (Name, [facility]): [facility], [place] ? ?3. On Med List: Y ? ?4. Last Visit with PCP: 09-08-2021 ? ?5. Next visit date with PCP: 02-16-2022 ? ? ?Agent: Please be advised that RX refills may take up to 3 business days. We ask that you follow-up with your pharmacy.  ?

## 2021-09-16 ENCOUNTER — Other Ambulatory Visit: Payer: Self-pay | Admitting: Internal Medicine

## 2021-09-19 DIAGNOSIS — N401 Enlarged prostate with lower urinary tract symptoms: Secondary | ICD-10-CM | POA: Diagnosis not present

## 2021-09-19 DIAGNOSIS — R350 Frequency of micturition: Secondary | ICD-10-CM | POA: Diagnosis not present

## 2021-09-19 DIAGNOSIS — R3914 Feeling of incomplete bladder emptying: Secondary | ICD-10-CM | POA: Diagnosis not present

## 2021-09-21 ENCOUNTER — Encounter: Payer: Self-pay | Admitting: Internal Medicine

## 2021-09-30 ENCOUNTER — Telehealth: Payer: Self-pay | Admitting: Emergency Medicine

## 2021-09-30 NOTE — Telephone Encounter (Signed)
LVM for pt to rtn my call to schedule AWV with [facility]. Please schedule if pt calls the office.  ?

## 2021-10-02 ENCOUNTER — Telehealth: Payer: Self-pay | Admitting: Emergency Medicine

## 2021-10-02 NOTE — Telephone Encounter (Signed)
LVM for pt to rtn my call to schedule AWV with [facility]. Call back # [phone] 

## 2021-10-15 ENCOUNTER — Ambulatory Visit (INDEPENDENT_AMBULATORY_CARE_PROVIDER_SITE_OTHER): Payer: Medicare Other | Admitting: Emergency Medicine

## 2021-10-15 ENCOUNTER — Encounter: Payer: Self-pay | Admitting: Emergency Medicine

## 2021-10-15 VITALS — BP 122/72 | HR 83 | Temp 98.4°F | Ht 74.0 in | Wt 219.5 lb

## 2021-10-15 DIAGNOSIS — I10 Essential (primary) hypertension: Secondary | ICD-10-CM | POA: Diagnosis not present

## 2021-10-15 DIAGNOSIS — I2510 Atherosclerotic heart disease of native coronary artery without angina pectoris: Secondary | ICD-10-CM | POA: Diagnosis not present

## 2021-10-15 DIAGNOSIS — N138 Other obstructive and reflux uropathy: Secondary | ICD-10-CM | POA: Diagnosis not present

## 2021-10-15 DIAGNOSIS — J4540 Moderate persistent asthma, uncomplicated: Secondary | ICD-10-CM | POA: Diagnosis not present

## 2021-10-15 DIAGNOSIS — N401 Enlarged prostate with lower urinary tract symptoms: Secondary | ICD-10-CM | POA: Diagnosis not present

## 2021-10-15 DIAGNOSIS — E785 Hyperlipidemia, unspecified: Secondary | ICD-10-CM | POA: Diagnosis not present

## 2021-10-15 NOTE — Patient Instructions (Signed)
Health Maintenance After Age 72 After age 72, you are at a higher risk for certain long-term diseases and infections as well as injuries from falls. Falls are a major cause of broken bones and head injuries in people who are older than age 72. Getting regular preventive care can help to keep you healthy and well. Preventive care includes getting regular testing and making lifestyle changes as recommended by your health care provider. Talk with your health care provider about: Which screenings and tests you should have. A screening is a test that checks for a disease when you have no symptoms. A diet and exercise plan that is right for you. What should I know about screenings and tests to prevent falls? Screening and testing are the best ways to find a health problem early. Early diagnosis and treatment give you the best chance of managing medical conditions that are common after age 72. Certain conditions and lifestyle choices may make you more likely to have a fall. Your health care provider may recommend: Regular vision checks. Poor vision and conditions such as cataracts can make you more likely to have a fall. If you wear glasses, make sure to get your prescription updated if your vision changes. Medicine review. Work with your health care provider to regularly review all of the medicines you are taking, including over-the-counter medicines. Ask your health care provider about any side effects that may make you more likely to have a fall. Tell your health care provider if any medicines that you take make you feel dizzy or sleepy. Strength and balance checks. Your health care provider may recommend certain tests to check your strength and balance while standing, walking, or changing positions. Foot health exam. Foot pain and numbness, as well as not wearing proper footwear, can make you more likely to have a fall. Screenings, including: Osteoporosis screening. Osteoporosis is a condition that causes  the bones to get weaker and break more easily. Blood pressure screening. Blood pressure changes and medicines to control blood pressure can make you feel dizzy. Depression screening. You may be more likely to have a fall if you have a fear of falling, feel depressed, or feel unable to do activities that you used to do. Alcohol use screening. Using too much alcohol can affect your balance and may make you more likely to have a fall. Follow these instructions at home: Lifestyle Do not drink alcohol if: Your health care provider tells you not to drink. If you drink alcohol: Limit how much you have to: 0-1 drink a day for women. 0-2 drinks a day for men. Know how much alcohol is in your drink. In the [place] one drink equals one 12 oz bottle of beer (355 mL), one 5 oz glass of wine (148 mL), or one 1 oz glass of hard liquor (44 mL). Do not use any products that contain nicotine or tobacco. These products include cigarettes, chewing tobacco, and vaping devices, such as e-cigarettes. If you need help quitting, ask your health care provider. Activity  Follow a regular exercise program to stay fit. This will help you maintain your balance. Ask your health care provider what types of exercise are appropriate for you. If you need a cane or walker, use it as recommended by your health care provider. Wear supportive shoes that have nonskid soles. Safety  Remove any tripping hazards, such as rugs, cords, and clutter. Install safety equipment such as grab bars in bathrooms and safety rails on stairs. Keep rooms and walkways   well-lit. General instructions Talk with your health care provider about your risks for falling. Tell your health care provider if: You fall. Be sure to tell your health care provider about all falls, even ones that seem minor. You feel dizzy, tiredness (fatigue), or off-balance. Take over-the-counter and prescription medicines only as told by your health care provider. These include  supplements. Eat a healthy diet and maintain a healthy weight. A healthy diet includes low-fat dairy products, low-fat (lean) meats, and fiber from whole grains, beans, and lots of fruits and vegetables. Stay current with your vaccines. Schedule regular health, dental, and eye exams. Summary Having a healthy lifestyle and getting preventive care can help to protect your health and wellness after age 72. Screening and testing are the best way to find a health problem early and help you avoid having a fall. Early diagnosis and treatment give you the best chance for managing medical conditions that are more common for people who are older than age 72. Falls are a major cause of broken bones and head injuries in people who are older than age 72. Take precautions to prevent a fall at home. Work with your health care provider to learn what changes you can make to improve your health and wellness and to prevent falls. This information is not intended to replace advice given to you by your health care provider. Make sure you discuss any questions you have with your health care provider. Document Revised: 09/23/2020 Document Reviewed: 09/23/2020 Elsevier Patient Education  2023 Elsevier Inc.  

## 2021-10-15 NOTE — Progress Notes (Signed)
Adam Peters 71 y.o.   Chief Complaint  Patient presents with   Annual Exam    No concerns    HISTORY OF PRESENT ILLNESS: This is a 71 y.o. male with history of hypertension and dyslipidemia here for follow-up. Has no complaints or medical concerns today.  HPI   Prior to Admission medications   Medication Sig Start Date End Date Taking? Authorizing Provider  aspirin EC 81 MG tablet Take 81 mg by mouth daily.   Yes [provider]  atorvastatin (LIPITOR) 40 MG tablet Take 1 tablet (40 mg total) by mouth daily. 08/14/21  Yes [name], Nadean Corwin, MD  azelastine (ASTELIN) 0.1 % nasal spray USE 2 SPRAYS IN EACH NOSTRIL TWICE A DAY AS NEEDED FOR RHINITIS AS DIRECTED (LAST FILL NEED APPOINTMENT) 07/07/21  Yes Valentina Shaggy, MD  BREZTRI AEROSPHERE 160-9-4.8 MCG/ACT AERO Inhale 2 puffs into the lungs in the morning and at bedtime. 05/01/21  Yes Valentina Shaggy, MD  buPROPion (WELLBUTRIN XL) 150 MG 24 hr tablet Take 150 mg by mouth every morning. 12/10/19  Yes [provider]  Cholecalciferol (VITAMIN D) 50 MCG (2000 UT) tablet Take by mouth. 12/03/20  Yes [provider]  escitalopram (LEXAPRO) 20 MG tablet Take 25 mg by mouth daily.   Yes [provider]  fexofenadine-pseudoephedrine (ALLEGRA-D 24) 180-240 MG 24 hr tablet Take 1 tablet by mouth daily.    Yes [provider]  fluticasone (FLONASE) 50 MCG/ACT nasal spray USE 2 SPRAYS IN EACH NOSTRIL DAILY 07/03/21  Yes Valentina Shaggy, MD  nitroGLYCERIN (NITROSTAT) 0.4 MG SL tablet DISSOLVE 1 TABLET UNDER THE TONGUE EVERY 5 MINUTES AS NEEDED FOR CHEST PAIN 09/16/21  Yes [name], Nadean Corwin, MD  olmesartan (BENICAR) 20 MG tablet Take 1 tablet (20 mg total) by mouth daily. 09/15/21  Yes Horald Pollen, MD  omeprazole (PRILOSEC) 20 MG capsule TAKE 2 CAPSULES TWICE A DAY BEFORE MEALS 09/15/21  Yes [name], Nadean Corwin, MD  silodosin (RAPAFLO) 8 MG CAPS capsule Take 8 mg by mouth daily. 09/19/21   Yes [provider]  alprostadil (EDEX) 20 MCG injection 20 mcg by Intracavitary route as needed for erectile dysfunction. use no more than 3 times per week Patient not taking: Reported on 10/15/2021    [provider]  sildenafil (VIAGRA) 100 MG tablet TAKE 1 TABLET DAILY AS NEEDED FOR ERECTILE DYSFUNCTION (NOT TO USE MEDICATION WITH NITROGLYCERIN) Patient not taking: Reported on 10/15/2021 04/06/19   Pixie Casino, MD    Allergies  Allergen Reactions   Amoxicillin Diarrhea    GI upset.    Patient Active Problem List   Diagnosis Date Noted   BPH with obstruction/lower urinary tract symptoms 09/08/2021   Moderate persistent asthma, uncomplicated 35/57/3220   OSA on CPAP 03/10/2021   Asthma 10/03/2020   Seasonal and perennial allergic rhinitis 04/04/2019   Ex-smoker 04/04/2019   Mild persistent asthma, uncomplicated 25/42/7062   Essential hypertension    Dyslipidemia    History of tobacco use 07/08/2012   Glaucoma 04/08/2012   DDD (degenerative disc disease), cervical    CAD- LAD BMS '97, RCA DES 08/02/13 08/17/2011    Past Medical History:  Diagnosis Date   Allergy    seasonal   Anemia    Anxiety    Asthma    CAD (coronary artery disease)    a. 1997 PCI/BMS to distal LAD (J&J PS BMS);  b. 03/2011 Neg MV;  c. 07/2013 NSTEMI/PCI: LM 20d, LAD 51m  50d isr, LCX nl, OM1/2 nl, RCA dom 95-4m(3.5x16 Promus premier DES), EF 55%, mild basal inf HK.   Cataract    Coronary artery disease    s/p PCI to LAD (1997) - Dr. ARockne Menghini  GERD (gastroesophageal reflux disease)    Hepatitis C    History of tobacco use    Hyperlipidemia    Hypertension    [place]    Past Surgical History:  [place]cedure Laterality Date   [place]   patent LAD stent (Dr RGeorges Lynch   [place] 06/[place]   prox LAD stenting 80% to 0% (Dr. ARockne Menghini - repeat cath in 11/1999 showed no significant coronary  disease & normal systolic function (Dr. RGerrie Nordmann   CORONARY STENT PLACEMENT  08/02/2013   RCA   DES         [place]  knife wound repair  [place] CORONARY ANGIOGRAM N/A 08/02/2013   Procedure: LEFT HEART CATHETERIZATION WITH CORONARY ANGIOGRAM;  Surgeon: MBlane Ohara MD;  [facility];  Service: Cardiovascular;  Laterality: N/A;   TRANSTHORACIC ECHOCARDIOGRAM  11/13/2008   EF 50-55%, normal LV systolic function; RA mildly dilated; trace MR/TR/PR    Social History   Socioeconomic History   Marital status: Married    Spouse name: Not on file   Number of children: Not on file   Years of education: B.A.   Highest education level: Not on file  Occupational History   Occupation: retired    [employer]ish farm manager [employer]  Tobacco Use   Smoking status: Former    Packs/day: 1.00    Types: Cigarettes    Quit date: 05/18/1993    Years since quitting: 28.4   Smokeless tobacco: Never  Vaping Use   Vaping Use: Never used  Substance and Sexual Activity   Alcohol use: Yes    Alcohol/week: 3.0 standard drinks    Types: 3 Glasses of wine per week    Comment: SOCIAL   Drug use: No   Sexual activity: Not Currently  Other Topics Concern   Not on file  Social History Narrative   Cafffeine" occasional.  Education 4 yr college.  WorK: retired.     Social Determinants of Health   Financial Resource Strain: Not on file  Food Insecurity: Not on file  Transportation Needs: Not on file  Physical Activity: Not on file  Stress: Not on file  Social Connections: Not on file  Intimate Partner Violence: Not on file    Family History  Problem Relation Age of Onset   Asthma Mother    Crohn's disease Mother    Heart disease Mother    Hepatitis C Brother    Testicular cancer Brother    Dementia Father    Diabetes Maternal Grandmother    Emphysema Maternal Aunt    Cancer Maternal Aunt    Colon cancer Neg Hx      Review of  Systems  Constitutional: Negative.  Negative for chills and fever.  HENT: Negative.  Negative for congestion and sore throat.   Respiratory: Negative.  Negative for cough and shortness of breath.   Cardiovascular: Negative.  Negative for chest pain and palpitations.  Gastrointestinal: Negative.  Negative for abdominal pain, diarrhea, nausea and vomiting.  Genitourinary: Negative.  Negative for dysuria and hematuria.  Musculoskeletal: Negative.   Skin: Negative.  Negative for rash.  Neurological: Negative.  Negative for dizziness and headaches.  All other systems reviewed and are negative.  Today's Vitals   10/15/21 0921  BP: 122/72  Pulse: 83  Temp: 98.4 F (36.9 C)  TempSrc: Oral  SpO2: 92%  Weight: 219 lb 8 oz (99.6 kg)  Height: '6\' 2"'$  (1.88 m)   Body mass index is 28.18 kg/m.  Physical Exam Vitals reviewed.  Constitutional:      Appearance: Normal appearance.  HENT:     Head: Normocephalic.  Eyes:     Extraocular Movements: Extraocular movements intact.     Conjunctiva/sclera: Conjunctivae normal.     Pupils: Pupils are equal, round, and reactive to light.  Cardiovascular:     Rate and Rhythm: Normal rate and regular rhythm.     Pulses: Normal pulses.     Heart sounds: Normal heart sounds.  Pulmonary:     Effort: Pulmonary effort is normal.     Breath sounds: Normal breath sounds.  Musculoskeletal:     Cervical back: No tenderness.     Right lower leg: No edema.     Left lower leg: No edema.  Lymphadenopathy:     Cervical: No cervical adenopathy.  Skin:    General: Skin is warm and dry.  Neurological:     General: No focal deficit present.     Mental Status: He is alert and oriented to person, place, and time.  Psychiatric:        Mood and Affect: Mood normal.        Behavior: Behavior normal.     ASSESSMENT & PLAN: Problem List Items Addressed This Visit       Cardiovascular and Mediastinum   Essential hypertension - Primary (Chronic)     Well-controlled hypertension.  Continue olmesartan 20 mg daily BP Readings from Last 3 Encounters:  10/15/21 122/72  09/08/21 110/72  07/17/21 135/64           Respiratory   Moderate persistent asthma, uncomplicated    Well-controlled on Breztri twice a day.         Genitourinary   BPH with obstruction/lower urinary tract symptoms    Stable and well-controlled on Flomax 0.4 mg daily.       Relevant Medications   silodosin (RAPAFLO) 8 MG CAPS capsule     Other   Dyslipidemia    Stable.  Diet and nutrition discussed.  Continue atorvastatin 40 mg daily.       Other Visit Diagnoses     Hyperlipidemia, unspecified hyperlipidemia type          Patient Instructions  Health Maintenance After Age 89 After age 46, you are at a higher risk for certain long-term diseases and infections as well as injuries from falls. Falls are a major cause of broken bones and head injuries in people who are older than age 1. Getting regular preventive care can help to keep you healthy and well. Preventive care includes getting regular testing and making lifestyle changes as recommended by your health care provider. Talk with your health care provider about: Which screenings and tests you should have. A screening is a test that checks for a disease when you have no symptoms. A diet and exercise plan that is right for you. What should I know about screenings and tests to prevent falls? Screening and testing are the best ways to find a health problem early. Early diagnosis and treatment give you the best chance of managing medical conditions that  are common after age 96. Certain conditions and lifestyle choices may make you more likely to have a fall. Your health care provider may recommend: Regular vision checks. Poor vision and conditions such as cataracts can make you more likely to have a fall. If you wear glasses, make sure to get your prescription updated if your vision changes. Medicine  review. Work with your health care provider to regularly review all of the medicines you are taking, including over-the-counter medicines. Ask your health care provider about any side effects that may make you more likely to have a fall. Tell your health care provider if any medicines that you take make you feel dizzy or sleepy. Strength and balance checks. Your health care provider may recommend certain tests to check your strength and balance while standing, walking, or changing positions. Foot health exam. Foot pain and numbness, as well as not wearing proper footwear, can make you more likely to have a fall. Screenings, including: Osteoporosis screening. Osteoporosis is a condition that causes the bones to get weaker and break more easily. Blood pressure screening. Blood pressure changes and medicines to control blood pressure can make you feel dizzy. Depression screening. You may be more likely to have a fall if you have a fear of falling, feel depressed, or feel unable to do activities that you used to do. Alcohol use screening. Using too much alcohol can affect your balance and may make you more likely to have a fall. Follow these instructions at home: Lifestyle Do not drink alcohol if: Your health care provider tells you not to drink. If you drink alcohol: Limit how much you have to: 0-1 drink a day for women. 0-2 drinks a day for men. Know how much alcohol is in your drink. In the [place] one drink equals one 12 oz bottle of beer (355 mL), one 5 oz glass of wine (148 mL), or one 1 oz glass of hard liquor (44 mL). Do not use any products that contain nicotine or tobacco. These products include cigarettes, chewing tobacco, and vaping devices, such as e-cigarettes. If you need help quitting, ask your health care provider. Activity  Follow a regular exercise program to stay fit. This will help you maintain your balance. Ask your health care provider what types of exercise are appropriate for  you. If you need a cane or walker, use it as recommended by your health care provider. Wear supportive shoes that have nonskid soles. Safety  Remove any tripping hazards, such as rugs, cords, and clutter. Install safety equipment such as grab bars in bathrooms and safety rails on stairs. Keep rooms and walkways well-lit. General instructions Talk with your health care provider about your risks for falling. Tell your health care provider if: You fall. Be sure to tell your health care provider about all falls, even ones that seem minor. You feel dizzy, tiredness (fatigue), or off-balance. Take over-the-counter and prescription medicines only as told by your health care provider. These include supplements. Eat a healthy diet and maintain a healthy weight. A healthy diet includes low-fat dairy products, low-fat (lean) meats, and fiber from whole grains, beans, and lots of fruits and vegetables. Stay current with your vaccines. Schedule regular health, dental, and eye exams. Summary Having a healthy lifestyle and getting preventive care can help to protect your health and wellness after age 27. Screening and testing are the best way to find a health problem early and help you avoid having a fall. Early diagnosis and treatment give you  the best chance for managing medical conditions that are more common for people who are older than age 56. Falls are a major cause of broken bones and head injuries in people who are older than age 29. Take precautions to prevent a fall at home. Work with your health care provider to learn what changes you can make to improve your health and wellness and to prevent falls. This information is not intended to replace advice given to you by your health care provider. Make sure you discuss any questions you have with your health care provider. Document Revised: 09/23/2020 Document Reviewed: 09/23/2020 Elsevier Patient Education  [place], MD [facility]

## 2021-10-15 NOTE — Assessment & Plan Note (Signed)
Well-controlled on Breztri twice a day.

## 2021-10-15 NOTE — Assessment & Plan Note (Signed)
Stable.  Diet and nutrition discussed. Continue atorvastatin 40 mg daily. 

## 2021-10-15 NOTE — Assessment & Plan Note (Signed)
Stable and well-controlled on Flomax 0.4 mg daily.

## 2021-10-15 NOTE — Assessment & Plan Note (Signed)
Well-controlled hypertension.  Continue olmesartan 20 mg daily BP Readings from Last 3 Encounters:  10/15/21 122/72  09/08/21 110/72  07/17/21 135/64

## 2021-10-23 ENCOUNTER — Other Ambulatory Visit: Payer: Self-pay | Admitting: Allergy & Immunology

## 2021-11-12 ENCOUNTER — Ambulatory Visit (INDEPENDENT_AMBULATORY_CARE_PROVIDER_SITE_OTHER): Payer: Medicare Other

## 2021-11-12 VITALS — BP 126/70 | HR 82 | Temp 97.3°F | Ht 74.0 in | Wt 218.0 lb

## 2021-11-12 DIAGNOSIS — Z0000 Encounter for general adult medical examination without abnormal findings: Secondary | ICD-10-CM | POA: Diagnosis not present

## 2021-11-12 NOTE — Progress Notes (Signed)
Subjective:   Adam Peters is a 71 y.o. male who presents for Medicare Annual/Subsequent preventive examination.  Review of Systems     Cardiac Risk Factors include: advanced age (>42mn, >>15women);dyslipidemia;family history of premature cardiovascular disease;hypertension;male gender     Objective:    Today's Vitals   11/12/21 0836  BP: 126/70  Pulse: 82  Temp: (!) 97.3 F (36.3 C)  SpO2: 96%  Weight: 218 lb (98.9 kg)  Height: '6\' 2"'$  (1.88 m)  PainSc: 0-No pain   Body mass index is 27.99 kg/m.     10/04/2020    2:30 PM 09/15/2019    9:09 AM 01/11/2018   11:16 PM 01/11/2018    4:26 AM 10/20/2016    3:56 PM 08/02/2013   12:34 PM  Advanced Directives  Does Patient Have a Medical Advance Directive? No No  No No Patient does not have advance directive;Patient would not like information  Would patient like information on creating a medical advance directive? No - Patient declined Yes (ED - Information included in AVS) No - Patient declined     Pre-existing out of facility DNR order (yellow form or pink MOST form)      No    Current Medications (verified) Outpatient Encounter Medications as of 11/12/2021  Medication Sig   alprostadil (EDEX) 20 MCG injection 20 mcg by Intracavitary route as needed for erectile dysfunction. use no more than 3 times per week (Patient not taking: Reported on 10/15/2021)   aspirin EC 81 MG tablet Take 81 mg by mouth daily.   atorvastatin (LIPITOR) 40 MG tablet Take 1 tablet (40 mg total) by mouth daily.   azelastine (ASTELIN) 0.1 % nasal spray USE 2 SPRAYS IN EACH NOSTRIL TWICE A DAY AS NEEDED FOR RHINITIS AS DIRECTED (LAST FILL NEED APPOINTMENT)   BREZTRI AEROSPHERE 160-9-4.8 MCG/ACT AERO USE 2 INHALATIONS IN THE MORNING AND AT BEDTIME   buPROPion (WELLBUTRIN XL) 150 MG 24 hr tablet Take 150 mg by mouth every morning.   Cholecalciferol (VITAMIN D) 50 MCG (2000 UT) tablet Take by mouth.   escitalopram (LEXAPRO) 20 MG tablet Take 25 mg by mouth  daily.   fexofenadine-pseudoephedrine (ALLEGRA-D 24) 180-240 MG 24 hr tablet Take 1 tablet by mouth daily.    fluticasone (FLONASE) 50 MCG/ACT nasal spray USE 2 SPRAYS IN EACH NOSTRIL DAILY   nitroGLYCERIN (NITROSTAT) 0.4 MG SL tablet DISSOLVE 1 TABLET UNDER THE TONGUE EVERY 5 MINUTES AS NEEDED FOR CHEST PAIN   olmesartan (BENICAR) 20 MG tablet Take 1 tablet (20 mg total) by mouth daily.   omeprazole (PRILOSEC) 20 MG capsule TAKE 2 CAPSULES TWICE A DAY BEFORE MEALS   sildenafil (VIAGRA) 100 MG tablet TAKE 1 TABLET DAILY AS NEEDED FOR ERECTILE DYSFUNCTION (NOT TO USE MEDICATION WITH NITROGLYCERIN) (Patient not taking: Reported on 10/15/2021)   silodosin (RAPAFLO) 8 MG CAPS capsule Take 8 mg by mouth daily.   No facility-administered encounter medications on file as of 11/12/2021.    Allergies (verified) Amoxicillin   History: Past Medical History:  Diagnosis Date   Allergy    seasonal   Anemia    Anxiety    Asthma    CAD (coronary artery disease)    a. 1997 PCI/BMS to distal LAD (J&J PS BMS);  b. 03/2011 Neg MV;  c. 07/2013 NSTEMI/PCI: LM 20d, LAD 440m50d isr, LCX nl, OM1/2 nl, RCA dom 95-9925m.5x16 Promus premier DES), EF 55%, mild basal inf HK.   Cataract    Coronary artery  disease    s/p PCI to LAD (1997) - Dr. Rockne Menghini   GERD (gastroesophageal reflux disease)    Hepatitis C    History of tobacco use    Hyperlipidemia    Hypertension    Schizophrenia ([place]) 1978   Past Surgical History:  Procedure Laterality Date   [place]  2001   patent LAD stent (Dr Georges Lynch)   [place]  06/09/1995   prox LAD stenting 80% to 0% (Dr. Rockne Menghini) - repeat cath in 11/1999 showed no significant coronary disease & normal systolic function (Dr. Gerrie Nordmann)   CORONARY STENT PLACEMENT  08/02/2013   RCA   DES         DR Gates   knife wound repair  [place] WITH CORONARY ANGIOGRAM N/A  08/02/2013   Procedure: LEFT HEART CATHETERIZATION WITH CORONARY ANGIOGRAM;  Surgeon: Blane Ohara, MD;  Location: [facility] CATH LAB;  Service: Cardiovascular;  Laterality: N/A;   TRANSTHORACIC ECHOCARDIOGRAM  11/13/2008   EF 50-55%, normal LV systolic function; RA mildly dilated; trace MR/TR/PR   Family History  Problem Relation Age of Onset   Asthma Mother    Crohn's disease Mother    Heart disease Mother    Hepatitis C Brother    Testicular cancer Brother    Dementia Father    Diabetes Maternal Grandmother    Emphysema Maternal Aunt    Cancer Maternal Aunt    Colon cancer Neg Hx    Social History   Socioeconomic History   Marital status: Married    Spouse name: Not on file   Number of children: Not on file   Years of education: B.A.   Highest education level: Not on file  Occupational History   Occupation: retired    Fish farm manager: [employer]  Tobacco Use   Smoking status: Former    Packs/day: 1.00    Types: Cigarettes    Quit date: 05/18/1993    Years since quitting: 28.5   Smokeless tobacco: Never  Vaping Use   Vaping Use: Never used  Substance and Sexual Activity   Alcohol use: Yes    Alcohol/week: 3.0 standard drinks of alcohol    Types: 3 Glasses of wine per week    Comment: SOCIAL   Drug use: No   Sexual activity: Not Currently  Other Topics Concern   Not on file  Social History Narrative   Cafffeine" occasional.  Education 4 yr college.  WorK: retired.     Social Determinants of Health   Financial Resource Strain: Low Risk  (11/12/2021)   Overall Financial Resource Strain (CARDIA)    Difficulty of Paying Living Expenses: Not hard at all  Food Insecurity: No Food Insecurity (11/12/2021)   Hunger Vital Sign    Worried About Running Out of Food in the Last Year: Never true    Ran Out of Food in the Last Year: Never true  Transportation Needs: No Transportation Needs (11/12/2021)   PRAPARE - Hydrologist (Medical): No    Lack of  Transportation (Non-Medical): No  Physical Activity: Sufficiently Active (11/12/2021)   Exercise Vital Sign    Days of Exercise per Week: 7 days    Minutes of Exercise per Session: 60 min  Stress: No Stress Concern Present (11/12/2021)   [place]    Feeling  of Stress : Not at all  Social Connections: Socially Integrated (11/12/2021)   Social Connection and Isolation Panel [NHANES]    Frequency of Communication with Friends and Family: More than three times a week    Frequency of Social Gatherings with Friends and Family: More than three times a week    Attends Religious Services: More than 4 times per year    Active Member of [employer] or Organizations: Yes    Attends Music therapist: More than 4 times per year    Marital Status: Married    Tobacco Counseling Counseling given: Not Answered   Clinical Intake:  Pre-visit preparation completed: Yes  Pain : No/denies pain Pain Score: 0-No pain     BMI - recorded: 27.99 Nutritional Status: BMI 25 -29 Overweight Nutritional Risks: None Diabetes: No  How often do you need to have someone help you when you read instructions, pamphlets, or other written materials from your doctor or pharmacy?: 1 - Never What is the last grade level you completed in school?: Bachelor's Degree; Retired [employer]  Diabetic? no  Interpreter Needed?: No  Information entered by :: [employer], LPN.   Activities of Daily Living    11/12/2021    9:07 AM  In your present state of health, do you have any difficulty performing the following activities:  Hearing? 1  Vision? 0  Difficulty concentrating or making decisions? 1  Comment takes notes  Walking or climbing stairs? 0  Dressing or bathing? 0  Doing errands, shopping? 0  Preparing Food and eating ? N  Using the Toilet? N  In the past six months, have you accidently leaked urine? N  Do you have problems with loss  of bowel control? N  Managing your Medications? N  Managing your Finances? N  Housekeeping or managing your Housekeeping? N    Patient Care Team: Horald Pollen, MD as PCP - General (Internal Medicine) Horald Pollen, MD (Internal Medicine) Lavonna Monarch, MD as Consulting Physician (Dermatology) Corey Harold, MD as Consulting Physician (Ophthalmology)  Indicate any recent Medical Services you may have received from other than [facility] providers in the past year (date may be approximate).     Assessment:   This is a routine wellness examination for Adam Peters.  Hearing/Vision screen Hearing Screening - Comments:: Patient has issues with tinnitus. No hearing aids. Vision Screening - Comments:: Patient does wear corrective lenses/contacts.  Eye exam done by: Dr. Rolanda Jay   Dietary issues and exercise activities discussed: Current Exercise Habits: Home exercise routine;Structured exercise class, Type of exercise: walking;treadmill;stretching;strength training/weights, Time (Minutes): 60, Frequency (Times/Week): 7, Weekly Exercise (Minutes/Week): 420, Intensity: Moderate   Goals Addressed             This Visit's Progress    Stay strong and healthy.        Depression Screen    11/12/2021    9:05 AM 10/15/2021    9:25 AM 09/08/2021    2:02 PM 03/10/2021    1:19 PM 10/04/2020    2:29 PM 06/05/2020    2:10 PM 12/05/2019    8:49 AM  PHQ 2/9 Scores  PHQ - 2 Score 0 1 1 0 0 0 3  PHQ- 9 Score       12    Fall Risk    11/12/2021    9:07 AM 10/15/2021    9:25 AM 09/08/2021    2:02 PM 03/10/2021    1:19 PM 10/04/2020    2:31 PM  Fall Risk   Falls in the past year? 0 0 0 0 0  Number falls in past yr: 0   0 0  Injury with Fall? 0   0 0  Risk for fall due to : No Fall Risks    No Fall Risks  Follow up Falls evaluation completed    Falls evaluation completed    [place]:  Any stairs in or around the home? Yes  If so, are there any  without handrails? No  Home free of loose throw rugs in walkways, pet beds, electrical cords, etc? Yes  Adequate lighting in your home to reduce risk of falls? Yes   ASSISTIVE DEVICES UTILIZED TO PREVENT FALLS:  Life alert? No  Use of a cane, walker or w/c? No  Grab bars in the bathroom? No  Shower chair or bench in shower? Yes  Elevated toilet seat or a handicapped toilet? No   TIMED UP AND GO:  Was the test performed? Yes .  Length of time to ambulate 10 feet: 6 sec.   Gait steady and fast without use of assistive device  Cognitive Function:        11/12/2021    9:07 AM 09/15/2019    9:10 AM  6CIT Screen  What Year? 0 points 0 points  What month? 0 points 0 points  What time? 0 points 0 points  Count back from 20 0 points 0 points  Months in reverse 0 points 0 points  Repeat phrase 0 points 0 points  Total Score 0 points 0 points    Immunizations Immunization History  Administered Date(s) Administered   Fluad Quad(high Dose 65+) 01/31/2019, 01/14/2021   Influenza Split 03/12/2012, 04/04/2013   Influenza, High Dose Seasonal PF 02/24/2018   Moderna Covid-19 Vaccine Bivalent Booster 67yr & up 03/21/2021   Moderna Sars-Covid-2 Vaccination 06/20/2019, 07/17/2019, 08/17/2019, 03/18/2020, 08/29/2020   Pneumococcal Conjugate-13 10/22/2006   Pneumococcal Polysaccharide-23 04/04/2013, 06/07/2018   Tdap 07/08/2012    TDAP status: Up to date  Flu Vaccine status: Up to date  Pneumococcal vaccine status: Up to date  Covid-19 vaccine status: Completed vaccines  Qualifies for Shingles Vaccine? Yes   Zostavax completed Yes   Shingrix Completed?: Yes  Screening Tests Health Maintenance  Topic Date Due   INFLUENZA VACCINE  12/16/2021   COLONOSCOPY (Pts 45-42yrInsurance coverage will need to be confirmed)  11/04/2026   TETANUS/TDAP  12/08/2026   Pneumonia Vaccine 6536Years old  Completed   COVID-19 Vaccine  Completed   Hepatitis C Screening  Completed   Zoster  Vaccines- Shingrix  Completed   HPV VACCINES  Aged Out    Health Maintenance  There are no preventive care reminders to display for this patient.  Colorectal cancer screening: Type of screening: Colonoscopy. Completed 11/03/2016. Repeat every 10 years  Lung Cancer Screening: (Low Dose CT Chest recommended if Age 896-80ears, 30 pack-year currently smoking OR have quit w/in 15years.) does not qualify.   Lung Cancer Screening Referral: no  Additional Screening:  Hepatitis C Screening: does qualify; Completed 01/23/2015  Vision Screening: Recommended annual ophthalmology exams for early detection of glaucoma and other disorders of the eye. Is the patient up to date with their annual eye exam?  Yes  Who is the provider or what is the name of the office in which the patient attends annual eye exams? [facility]. If pt is not established with a provider, would they like to be referred  to a provider to establish care? No .   Dental Screening: Recommended annual dental exams for proper oral hygiene  Community Resource Referral / Chronic Care Management: CRR required this visit?  No   CCM required this visit?  No      Plan:     I have personally reviewed and noted the following in the patient's chart:   Medical and social history Use of alcohol, tobacco or illicit drugs  Current medications and supplements including opioid prescriptions. Patient is not currently taking opioid prescriptions. Functional ability and status Nutritional status Physical activity Advanced directives List of other physicians Hospitalizations, surgeries, and ER visits in previous 12 months Vitals Screenings to include cognitive, depression, and falls Referrals and appointments  In addition, I have reviewed and discussed with patient certain preventive protocols, quality metrics, and best practice recommendations. A written personalized care plan for preventive services as well as general preventive  health recommendations were provided to patient.     Sheral Flow, LPN   [id number]   Nurse Notes:  Hearing Screening - Comments:: Patient has issues with tinnitus. No hearing aids. Vision Screening - Comments:: Patient does wear corrective lenses/contacts.  Eye exam done by: Dr. Rolanda Jay

## 2021-11-12 NOTE — Patient Instructions (Signed)
Adam Peters , Thank you for taking time to come for your Medicare Wellness Visit. I appreciate your ongoing commitment to your health goals. Please review the following plan we discussed and let me know if I can assist you in the future.   Screening recommendations/referrals: Colonoscopy: 11/03/2016; due every 10 years Recommended yearly ophthalmology/optometry visit for glaucoma screening and checkup Recommended yearly dental visit for hygiene and checkup  Vaccinations: Influenza vaccine: 06/30/2021 Pneumococcal vaccine: 10/22/2006, 04/04/2013, 06/07/2018 Tdap vaccine: 07/08/2012; due every 10 years Shingles vaccine: 07/03/2020, 11/21/2020   Covid-19: 06/20/2019, 07/17/2019, 08/17/2019, 03/19/2020, 08/29/2020, 03/21/2021  Advanced directives: No  Conditions/risks identified: Yes  Next appointment: Please schedule your next Medicare Wellness Visit with your Nurse Health Advisor in 1 year by calling [phone].  Preventive Care 66 Years and Older, Male Preventive care refers to lifestyle choices and visits with your health care provider that can promote health and wellness. What does preventive care include? A yearly physical exam. This is also called an annual well check. Dental exams once or twice a year. Routine eye exams. Ask your health care provider how often you should have your eyes checked. Personal lifestyle choices, including: Daily care of your teeth and gums. Regular physical activity. Eating a healthy diet. Avoiding tobacco and drug use. Limiting alcohol use. Practicing safe sex. Taking low doses of aspirin every day. Taking vitamin and mineral supplements as recommended by your health care provider. What happens during an annual well check? The services and screenings done by your health care provider during your annual well check will depend on your age, overall health, lifestyle risk factors, and family history of disease. Counseling  Your health care provider may ask you  questions about your: Alcohol use. Tobacco use. Drug use. Emotional well-being. Home and relationship well-being. Sexual activity. Eating habits. History of falls. Memory and ability to understand (cognition). Work and work Statistician. Screening  You may have the following tests or measurements: Height, weight, and BMI. Blood pressure. Lipid and cholesterol levels. These may be checked every 5 years, or more frequently if you are over 78 years old. Skin check. Lung cancer screening. You may have this screening every year starting at age 53 if you have a 30-pack-year history of smoking and currently smoke or have quit within the past 15 years. Fecal occult blood test (FOBT) of the stool. You may have this test every year starting at age 39. Flexible sigmoidoscopy or colonoscopy. You may have a sigmoidoscopy every 5 years or a colonoscopy every 10 years starting at age 46. Prostate cancer screening. Recommendations will vary depending on your family history and other risks. Hepatitis C blood test. Hepatitis B blood test. Sexually transmitted disease (STD) testing. Diabetes screening. This is done by checking your blood sugar (glucose) after you have not eaten for a while (fasting). You may have this done every 1-3 years. Abdominal aortic aneurysm (AAA) screening. You may need this if you are a current or former smoker. Osteoporosis. You may be screened starting at age 26 if you are at high risk. Talk with your health care provider about your test results, treatment options, and if necessary, the need for more tests. Vaccines  Your health care provider may recommend certain vaccines, such as: Influenza vaccine. This is recommended every year. Tetanus, diphtheria, and acellular pertussis (Tdap, Td) vaccine. You may need a Td booster every 10 years. Zoster vaccine. You may need this after age 63. Pneumococcal 13-valent conjugate (PCV13) vaccine. One dose is recommended after age  65. Pneumococcal polysaccharide (PPSV23) vaccine. One dose is recommended after age 107. Talk to your health care provider about which screenings and vaccines you need and how often you need them. This information is not intended to replace advice given to you by your health care provider. Make sure you discuss any questions you have with your health care provider. Document Released: 05/31/2015 Document Revised: 01/22/2016 Document Reviewed: 03/05/2015 Elsevier Interactive Patient Education  2017 [place] Prevention in the Home Falls can cause injuries. They can happen to people of all ages. There are many things you can do to make your home safe and to help prevent falls. What can I do on the outside of my home? Regularly fix the edges of walkways and driveways and fix any cracks. Remove anything that might make you trip as you walk through a door, such as a raised step or threshold. Trim any bushes or trees on the path to your home. Use bright outdoor lighting. Clear any walking paths of anything that might make someone trip, such as rocks or tools. Regularly check to see if handrails are loose or broken. Make sure that both sides of any steps have handrails. Any raised decks and porches should have guardrails on the edges. Have any leaves, snow, or ice cleared regularly. Use sand or salt on walking paths during winter. Clean up any spills in your garage right away. This includes oil or grease spills. What can I do in the bathroom? Use night lights. Install grab bars by the toilet and in the tub and shower. Do not use towel bars as grab bars. Use non-skid mats or decals in the tub or shower. If you need to sit down in the shower, use a plastic, non-slip stool. Keep the floor dry. Clean up any water that spills on the floor as soon as it happens. Remove soap buildup in the tub or shower regularly. Attach bath mats securely with double-sided non-slip rug tape. Do not have throw  rugs and other things on the floor that can make you trip. What can I do in the bedroom? Use night lights. Make sure that you have a light by your bed that is easy to reach. Do not use any sheets or blankets that are too big for your bed. They should not hang down onto the floor. Have a firm chair that has side arms. You can use this for support while you get dressed. Do not have throw rugs and other things on the floor that can make you trip. What can I do in the kitchen? Clean up any spills right away. Avoid walking on wet floors. Keep items that you use a lot in easy-to-reach places. If you need to reach something above you, use a strong step stool that has a grab bar. Keep electrical cords out of the way. Do not use floor polish or wax that makes floors slippery. If you must use wax, use non-skid floor wax. Do not have throw rugs and other things on the floor that can make you trip. What can I do with my stairs? Do not leave any items on the stairs. Make sure that there are handrails on both sides of the stairs and use them. Fix handrails that are broken or loose. Make sure that handrails are as long as the stairways. Check any carpeting to make sure that it is firmly attached to the stairs. Fix any carpet that is loose or worn. Avoid having throw rugs at the  top or bottom of the stairs. If you do have throw rugs, attach them to the floor with carpet tape. Make sure that you have a light switch at the top of the stairs and the bottom of the stairs. If you do not have them, ask someone to add them for you. What else can I do to help prevent falls? Wear shoes that: Do not have high heels. Have rubber bottoms. Are comfortable and fit you well. Are closed at the toe. Do not wear sandals. If you use a stepladder: Make sure that it is fully opened. Do not climb a closed stepladder. Make sure that both sides of the stepladder are locked into place. Ask someone to hold it for you, if  possible. Clearly mark and make sure that you can see: Any grab bars or handrails. First and last steps. Where the edge of each step is. Use tools that help you move around (mobility aids) if they are needed. These include: Canes. Walkers. Scooters. Crutches. Turn on the lights when you go into a dark area. Replace any light bulbs as soon as they burn out. Set up your furniture so you have a clear path. Avoid moving your furniture around. If any of your floors are uneven, fix them. If there are any pets around you, be aware of where they are. Review your medicines with your doctor. Some medicines can make you feel dizzy. This can increase your chance of falling. Ask your doctor what other things that you can do to help prevent falls. This information is not intended to replace advice given to you by your health care provider. Make sure you discuss any questions you have with your health care provider. Document Released: 02/28/2009 Document Revised: 10/10/2015 Document Reviewed: 06/08/2014 Elsevier Interactive Patient Education  2017 [employer].

## 2021-11-17 ENCOUNTER — Other Ambulatory Visit: Payer: Self-pay | Admitting: Allergy & Immunology

## 2021-11-25 ENCOUNTER — Other Ambulatory Visit: Payer: Self-pay | Admitting: Allergy & Immunology

## 2021-12-02 ENCOUNTER — Other Ambulatory Visit: Payer: Self-pay | Admitting: Allergy & Immunology

## 2021-12-05 ENCOUNTER — Other Ambulatory Visit: Payer: Self-pay | Admitting: *Deleted

## 2021-12-05 MED ORDER — AZELASTINE HCL 0.1 % NA SOLN: 1.0000 | Freq: Two times a day (BID) | NASAL | 1 refills | Status: DC

## 2022-01-01 ENCOUNTER — Encounter: Payer: Self-pay | Admitting: Allergy & Immunology

## 2022-01-01 ENCOUNTER — Ambulatory Visit (INDEPENDENT_AMBULATORY_CARE_PROVIDER_SITE_OTHER): Payer: Medicare Other | Admitting: Allergy & Immunology

## 2022-01-01 VITALS — BP 124/72 | HR 72 | Temp 98.0°F | Resp 18 | Ht 74.0 in | Wt 221.2 lb

## 2022-01-01 DIAGNOSIS — J3089 Other allergic rhinitis: Secondary | ICD-10-CM | POA: Diagnosis not present

## 2022-01-01 DIAGNOSIS — I2510 Atherosclerotic heart disease of native coronary artery without angina pectoris: Secondary | ICD-10-CM

## 2022-01-01 DIAGNOSIS — J302 Other seasonal allergic rhinitis: Secondary | ICD-10-CM

## 2022-01-01 DIAGNOSIS — J4540 Moderate persistent asthma, uncomplicated: Secondary | ICD-10-CM | POA: Diagnosis not present

## 2022-01-01 NOTE — Progress Notes (Signed)
FOLLOW UP  Date of Service/Encounter:  01/01/22   Assessment:   Mild persistent asthma, uncomplicated - with worsening side effects from the Flovent, therefore changing to [facility] due to nighttime symptoms as well   Seasonal and perennial allergic rhinitis (grasses, ragweed, trees, indoor molds, outdoor molds, cat and dog)   Fully vaccinated to [employer] lover    Overall, Mr. Adam Peters is doing quite well with the [facility].  It has definitely helped his cough, which is great.  However, he is having some oral irritation and sore throat from this.  We are adding on a spacer to help with medication delivery into the lungs.  Hopefully this will decrease his throat irritation and allow him to continue use of the Espy.  I think we can hold off on the biologic addition, although we can get this in her back pocket if things deteriorate.  Plan/Recommendations:   1. Moderate persistent asthma, uncomplicated - Lung testing looks great today, better than it has in the past!  - We are going to add on a spacer to help MORE of the medicine enter the lungs and LESS of it irritate your throat. - Call us in two weeks with an update.  - We are going to avoid using the Xolair injections right now.  - Daily controller medication(s):  Breztri two puffs twice daily with spacer - Prior to physical activity: albuterol 2 puffs 10-15 minutes before physical activity. - Rescue medications: albuterol 4 puffs every 4-6 hours as needed - Asthma control goals:  * Full participation in all desired activities (may need albuterol before activity) * Albuterol use two time or less a week on average (not counting use with activity) * Cough interfering with sleep two time or less a month * Oral steroids no more than once a year * No hospitalizations  2. Seasonal and perennial allergic rhinitis (grasses, ragweed, trees, indoor molds, outdoor molds, cat and dog) - Continue with: Allegra (fexofenadine) '180mg'$   tablet once daily and Flonase (fluticasone) two sprays per nostril daily and Astelin (azelastine) 2 sprays per nostril 1-2 times daily. - Continue with salt water rinses before the nose sprays. - Allergy shots might be the best treatment option in the long run since that can decrease mucous production, but we will keep this in our back pocket.   3. Return in about 6 months (around 07/04/2022).   Subjective:   Adam Peters is a 71 y.o. male presenting today for follow up of  Chief Complaint  Patient presents with   Follow-up    CABLE Adam Peters has a history of the following: Patient Active Problem List   Diagnosis Date Noted   BPH with obstruction/lower urinary tract symptoms 09/08/2021   Moderate persistent asthma, uncomplicated 03/49/1791   OSA on CPAP 03/10/2021   Asthma 10/03/2020   Seasonal and perennial allergic rhinitis 04/04/2019   Ex-smoker 04/04/2019   Mild persistent asthma, uncomplicated 50/56/9794   Essential hypertension    Dyslipidemia    History of tobacco use 07/08/2012   Glaucoma 04/08/2012   DDD (degenerative disc disease), cervical    CAD- LAD BMS '97, RCA DES 08/02/13 08/17/2011    History obtained from: chart review and patient.  Adam Peters is a 71 y.o. male presenting for a follow up visit.  He was last seen in February 2023.  At that time, his lung testing looked great.  We obtained some lab work because he was having worsening side effects from the Buena.  We increased his Breztri to 2 puffs twice daily and talked about adding an injectable medication for his breathing.  For his rhinitis, would continue with Allegra and Flonase as well as Astelin.  In the interim, he had second thoughts on Xolair.  He never did start it.  Since last visit, he has done fairly well.  He has been traveling a lot, mostly up to [place] to see his mother.  She recently had her 91[age] birthday.  She seems to be in good health.  He actually recently took her down to  [place] for a large family reunion.  His mother was born in [place] and they have quite a lot of family there.  Asthma/Respiratory Symptom History: He is using the Breztri 2 puffs twice daily.  This has helped his cough tremendously.  However, he reports a worsening sore throat.  Of note, he does not use a spacer.  He does have his albuterol to use as needed, but has not been using it.  He denies any nighttime wheezing or coughing.  He sleeps well at night.  He has not needed prednisone and has not been to the emergency room for his breathing issues.  Allergic Rhinitis Symptom History: His allergic rhinitis is under good control with the Allegra and the Flonase.  He has Astelin to use when things get worse.  He has not had any sinus infections since we saw him.  This is normally a fairly good time of the year for his symptoms.  Otherwise, there have been no changes to his past medical history, surgical history, family history, or social history.    Review of Systems  Constitutional: Negative.  Negative for chills, fever, malaise/fatigue and weight loss.  HENT:  Negative for congestion, ear discharge and ear pain.   Eyes:  Negative for pain, discharge and redness.  Respiratory:  Negative for cough, sputum production, shortness of breath and wheezing.   Cardiovascular: Negative.  Negative for chest pain and palpitations.  Gastrointestinal:  Negative for abdominal pain, constipation, diarrhea, heartburn, nausea and vomiting.  Skin: Negative.  Negative for itching and rash.  Neurological:  Negative for dizziness and headaches.  Endo/Heme/Allergies:  Negative for environmental allergies. Does not bruise/bleed easily.       Objective:   Blood pressure 124/72, pulse 72, temperature 98 F (36.7 C), resp. rate 18, height '6\' 2"'$  (1.88 m), weight 221 lb 4 oz (100.4 kg), SpO2 97 %. Body mass index is 28.41 kg/m.    Physical Exam Vitals reviewed.  Constitutional:      Appearance:  He is well-developed.  HENT:     Head: Normocephalic and atraumatic.     Right Ear: Tympanic membrane, ear canal and external ear normal.     Left Ear: Tympanic membrane, ear canal and external ear normal.     Nose: No nasal deformity, septal deviation, mucosal edema or rhinorrhea.     Right Turbinates: Enlarged, swollen and pale.     Left Turbinates: Enlarged, swollen and pale.     Right Sinus: No maxillary sinus tenderness or frontal sinus tenderness.     Left Sinus: No maxillary sinus tenderness or frontal sinus tenderness.     Mouth/Throat:     Mouth: Mucous membranes are not pale and not dry.     Pharynx: Uvula midline.  Eyes:     General: Lids are normal. No allergic shiner.       Right eye: No discharge.  Left eye: No discharge.     Conjunctiva/sclera: Conjunctivae normal.     Right eye: Right conjunctiva is not injected. No chemosis.    Left eye: Left conjunctiva is not injected. No chemosis.    Pupils: Pupils are equal, round, and reactive to light.  Cardiovascular:     Rate and Rhythm: Normal rate and regular rhythm.     Heart sounds: Normal heart sounds.  Pulmonary:     Effort: Pulmonary effort is normal. No tachypnea, accessory muscle usage or respiratory distress.     Breath sounds: Normal breath sounds. No wheezing, rhonchi or rales.     Comments: Moving air well in all lung fields.  No increased work of breathing. Chest:     Chest wall: No tenderness.  Lymphadenopathy:     Cervical: No cervical adenopathy.  Skin:    General: Skin is warm.     Capillary Refill: Capillary refill takes less than 2 seconds.     Coloration: Skin is not pale.     Findings: No abrasion, erythema, petechiae or rash. Rash is not papular, urticarial or vesicular.     Comments: No eczematous or urticarial lesions noted.  Neurological:     Mental Status: He is alert.  Psychiatric:        Behavior: Behavior is cooperative.      Diagnostic studies:    Spirometry: results normal  (FEV1: 2.75/81%, FVC: 3.76/85%, FEV1/FVC: 73%).    Spirometry consistent with normal pattern.    Allergy Studies:  none           Salvatore Marvel, MD  [facility]

## 2022-01-01 NOTE — Patient Instructions (Addendum)
1. Moderate persistent asthma, uncomplicated - Lung testing looks great today, better than it has in the past!  - We are going to add on a spacer to help MORE of the medicine enter the lungs and LESS of it irritate your throat. - Call us in two weeks with an update.  - We are going to avoid using the Xolair injections right now.  - Daily controller medication(s):  Breztri two puffs twice daily with spacer - Prior to physical activity: albuterol 2 puffs 10-15 minutes before physical activity. - Rescue medications: albuterol 4 puffs every 4-6 hours as needed - Asthma control goals:  * Full participation in all desired activities (may need albuterol before activity) * Albuterol use two time or less a week on average (not counting use with activity) * Cough interfering with sleep two time or less a month * Oral steroids no more than once a year * No hospitalizations  2. Seasonal and perennial allergic rhinitis (grasses, ragweed, trees, indoor molds, outdoor molds, cat and dog) - Continue with: Allegra (fexofenadine) '180mg'$  tablet once daily and Flonase (fluticasone) two sprays per nostril daily and Astelin (azelastine) 2 sprays per nostril 1-2 times daily. - Continue with salt water rinses before the nose sprays. - Allergy shots might be the best treatment option in the long run since that can decrease mucous production, but we will keep this in our back pocket.   3. Return in about 6 months (around 07/04/2022).    Please inform us of any Emergency Department visits, hospitalizations, or changes in symptoms. Call us before going to the ED for breathing or allergy symptoms since we might be able to fit you in for a sick visit. Feel free to contact us anytime with any questions, problems, or concerns.  It was a pleasure to see you again today!  Websites that have reliable patient information: 1. American Academy of Asthma, Allergy, and Immunology: www.aaaai.org 2. Food Allergy Research and  Education (FARE): foodallergy.org 3. Mothers of Asthmatics: http://www.asthmacommunitynetwork.org 4. American College of Allergy, Asthma, and Immunology: www.acaai.org   COVID-19 Vaccine Information can be found at: ShippingScam.co.uk For questions related to vaccine distribution or appointments, please email [email or web address] or call [phone].     "Like" [place] on Facebook and Instagram for our latest updates!       Make sure you are registered to vote! If you have moved or changed any of your contact information, you will need to get this updated before voting!  In some cases, you MAY be able to register to vote online: [email or web address]

## 2022-01-02 ENCOUNTER — Encounter: Payer: Self-pay | Admitting: Allergy & Immunology

## 2022-01-30 DIAGNOSIS — H2513 Age-related nuclear cataract, bilateral: Secondary | ICD-10-CM | POA: Diagnosis not present

## 2022-01-30 DIAGNOSIS — H40013 Open angle with borderline findings, low risk, bilateral: Secondary | ICD-10-CM | POA: Diagnosis not present

## 2022-01-30 DIAGNOSIS — I10 Essential (primary) hypertension: Secondary | ICD-10-CM | POA: Diagnosis not present

## 2022-02-16 ENCOUNTER — Ambulatory Visit (INDEPENDENT_AMBULATORY_CARE_PROVIDER_SITE_OTHER): Payer: Medicare Other

## 2022-02-16 ENCOUNTER — Ambulatory Visit (INDEPENDENT_AMBULATORY_CARE_PROVIDER_SITE_OTHER): Payer: Medicare Other | Admitting: Emergency Medicine

## 2022-02-16 ENCOUNTER — Encounter: Payer: Self-pay | Admitting: Emergency Medicine

## 2022-02-16 ENCOUNTER — Inpatient Hospital Stay: Payer: Self-pay

## 2022-02-16 VITALS — BP 130/72 | HR 62 | Temp 97.8°F | Ht 74.0 in | Wt 215.0 lb

## 2022-02-16 DIAGNOSIS — G8929 Other chronic pain: Secondary | ICD-10-CM

## 2022-02-16 DIAGNOSIS — I2510 Atherosclerotic heart disease of native coronary artery without angina pectoris: Secondary | ICD-10-CM | POA: Diagnosis not present

## 2022-02-16 DIAGNOSIS — I10 Essential (primary) hypertension: Secondary | ICD-10-CM

## 2022-02-16 DIAGNOSIS — M25511 Pain in right shoulder: Secondary | ICD-10-CM

## 2022-02-16 DIAGNOSIS — I739 Peripheral vascular disease, unspecified: Secondary | ICD-10-CM

## 2022-02-16 DIAGNOSIS — R0609 Other forms of dyspnea: Secondary | ICD-10-CM | POA: Diagnosis not present

## 2022-02-16 DIAGNOSIS — Z8709 Personal history of other diseases of the respiratory system: Secondary | ICD-10-CM | POA: Diagnosis not present

## 2022-02-16 DIAGNOSIS — E785 Hyperlipidemia, unspecified: Secondary | ICD-10-CM

## 2022-02-16 DIAGNOSIS — J4540 Moderate persistent asthma, uncomplicated: Secondary | ICD-10-CM | POA: Diagnosis not present

## 2022-02-16 NOTE — Assessment & Plan Note (Signed)
Clinical symptoms affecting quality of life. Needs vascular Doppler ultrasound to rule out peripheral vascular disease

## 2022-02-16 NOTE — Assessment & Plan Note (Signed)
Well-controlled hypertension. Continue olmesartan 20 mg daily. BP Readings from Last 3 Encounters:  02/16/22 130/72  01/01/22 124/72  11/12/21 126/70

## 2022-02-16 NOTE — Progress Notes (Signed)
Adam Peters Reading 71 y.o.   Chief Complaint  Patient presents with   Follow-up    68mth f/u appt, patient is having trouble with his breath, taking deep breaths    Shoulder Pain    Patient lifts weight having pain for awhile, radiating down his right arm     HISTORY OF PRESENT ILLNESS: This is a 71y.o. male complaining of pain to right shoulder for several months. Also complaining of occasional shortness of breath on exertion but not always. Denies chest pain on exertion. However complains of bilateral leg cramping and pain about 10-15 minutes after starting walking on the treadmill.  Pain gets better with rest. No other associated symptoms. No other complaints or medical concerns today.  HPI   Prior to Admission medications   Medication Sig Start Date End Date Taking? Authorizing Provider  aspirin EC 81 MG tablet Take 81 mg by mouth daily.   Yes [provider]  atorvastatin (LIPITOR) 40 MG tablet Take 1 tablet (40 mg total) by mouth daily. 08/14/21  Yes [name], [name]  azelastine (ASTELIN) 0.1 % nasal spray Place 1 spray into both nostrils 2 (two) times daily. Use in each nostril as directed 12/05/21  Yes [name]  BREZTRI AEROSPHERE 160-9-4.8 MCG/ACT AERO USE 2 INHALATIONS IN THE MORNING AND AT BEDTIME 10/23/21  Yes [name]  buPROPion (WELLBUTRIN XL) 150 MG 24 hr tablet Take 150 mg by mouth every morning. 12/10/19  Yes [provider]  Cholecalciferol (VITAMIN D) 50 MCG (2000 UT) tablet Take by mouth. 12/03/20  Yes [provider]  escitalopram (LEXAPRO) 20 MG tablet Take 25 mg by mouth daily.   Yes [provider]  fexofenadine-pseudoephedrine (ALLEGRA-D 24) 180-240 MG 24 hr tablet Take 1 tablet by mouth daily.    Yes [provider]  fluticasone (FLONASE) 50 MCG/ACT nasal spray USE 2 SPRAYS IN EACH NOSTRIL DAILY 07/03/21  Yes [name]  nitroGLYCERIN (NITROSTAT) 0.4 MG SL tablet  DISSOLVE 1 TABLET UNDER THE TONGUE EVERY 5 MINUTES AS NEEDED FOR CHEST PAIN 09/16/21  Yes [name], [name]  olmesartan (BENICAR) 20 MG tablet Take 1 tablet (20 mg total) by mouth daily. 09/15/21  Yes [name]  omeprazole (PRILOSEC) 20 MG capsule TAKE 2 CAPSULES TWICE A DAY BEFORE MEALS 09/15/21  Yes [name], [name]  silodosin (RAPAFLO) 8 MG CAPS capsule Take 8 mg by mouth daily. 09/19/21  Yes [provider]    Allergies  Allergen Reactions   Amoxicillin Diarrhea    GI upset.    Patient Active Problem List   Diagnosis Date Noted   BPH with obstruction/lower urinary tract symptoms 09/08/2021   Moderate persistent asthma, uncomplicated 028/36/6294  OSA on CPAP 03/10/2021   Asthma 10/03/2020   Seasonal and perennial allergic rhinitis 04/04/2019   Ex-smoker 04/04/2019   Mild persistent asthma, uncomplicated 176/54/6503  Essential hypertension    Dyslipidemia    History of tobacco use 07/08/2012   Glaucoma 04/08/2012   DDD (degenerative disc disease), cervical    CAD- LAD BMS '97, RCA DES 08/02/13 08/17/2011    Past Medical History:  Diagnosis Date   Allergy    seasonal   Anemia    Anxiety    Asthma    CAD (coronary artery disease)    a. 1997 PCI/BMS to distal LAD (J&J PS BMS);  b. 03/2011 Neg MV;  c. 07/2013 NSTEMI/PCI: LM 20d, LAD 418m50d  isr, LCX nl, OM1/2 nl, RCA dom 95-74m(3.5x16 Promus premier DES), EF 55%, mild basal inf HK.   Cataract    Coronary artery disease    s/p PCI to LAD (1997) - Dr. ARockne Menghini  GERD (gastroesophageal reflux disease)    Hepatitis C    History of tobacco use    Hyperlipidemia    Hypertension    [place]    Past Surgical History:  [place]cedure Laterality Date   [place]   patent LAD stent (Dr RGeorges Lynch   [place] 06/09[place]1995   prox LAD stenting 80% to 0% (Dr. ARockne Menghini - repeat cath in 11/1999 showed no significant coronary  disease & normal systolic function (Dr. RGerrie Nordmann   CORONARY STENT PLACEMENT  08/02/2013   RCA   DES         [place]  knife wound repair  [place]A 08/02/2013   Procedure: LEFT HEART CATHETERIZATION WITH CORONARY ANGIOGRAM;  Surgeon: MBlane Ohara MD;  [facility];  Service: Cardiovascular;  Laterality: N/A;   TRANSTHORACIC ECHOCARDIOGRAM  11/13/2008   EF 50-55%, normal LV systolic function; RA mildly dilated; trace MR/TR/PR    Social History   Socioeconomic History   Marital status: Married    Spouse name: Not on file   Number of children: Not on file   Years of education: B.A.   Highest education level: Not on file  Occupational History   Occupation: retired    EFish farm manager [employer]  Tobacco Use   Smoking status: Former    Packs/day: 1.00    Types: Cigarettes    Quit date: 05/18/1993    Years since quitting: 28.7   Smokeless tobacco: Never  Vaping Use   Vaping Use: Never used  Substance and Sexual Activity   Alcohol use: Yes    Alcohol/week: 3.0 standard drinks of alcohol    Types: 3 Glasses of wine per week    Comment: SOCIAL   Drug use: No   Sexual activity: Not Currently  Other Topics Concern   Not on file  Social History Narrative   Cafffeine" occasional.  Education 4 yr college.  WorK: retired.     Social Determinants of Health   Financial Resource Strain: Low Risk  (11/12/2021)   Overall Financial Resource Strain (CARDIA)    Difficulty of Paying Living Expenses: Not hard at all  Food Insecurity: No Food Insecurity (11/12/2021)   Hunger Vital Sign    Worried About Running Out of Food in the Last Year: Never true    Ran Out of Food in the Last Year: Never true  Transportation Needs: No Transportation Needs (11/12/2021)   PRAPARE - THydrologist(Medical): No    Lack of Transportation (Non-Medical): No  Physical Activity: Sufficiently Active  (11/12/2021)   Exercise Vital Sign    Days of Exercise per Week: 7 days    Minutes of Exercise per Session: 60 min  Stress: No Stress Concern Present (11/12/2021)   FL[place]   Feeling of Stress : Not at all  Social Connections: [place](11/12/2021)   Social Connection and Isolation Panel [NHANES]    Frequency of Communication with Friends and Family: More than three times a week    Frequency of  Social Gatherings with Friends and Family: More than three times a week    Attends Religious Services: More than 4 times per year    Active Member of [employer] or Organizations: Yes    Attends Music therapist: More than 4 times per year    Marital Status: Married  Human resources officer Violence: Not At Risk (11/12/2021)   Humiliation, Afraid, Rape, and Kick questionnaire    Fear of Current or Ex-Partner: No    Emotionally Abused: No    Physically Abused: No    Sexually Abused: No    Family History  Problem Relation Age of Onset   Asthma Mother    Crohn's disease Mother    Heart disease Mother    Hepatitis C Brother    Testicular cancer Brother    Dementia Father    Diabetes Maternal Grandmother    Emphysema Maternal Aunt    Cancer Maternal Aunt    Colon cancer Neg Hx      Review of Systems  Constitutional: Negative.  Negative for chills and fever.  HENT:  Negative for congestion, hearing loss and sore throat.   Respiratory: Negative.  Negative for cough and shortness of breath.   Cardiovascular: Negative.  Negative for chest pain and palpitations.  Gastrointestinal:  Negative for abdominal pain, nausea and vomiting.  Genitourinary: Negative.  Negative for dysuria and hematuria.  Skin: Negative.  Negative for rash.  Neurological:  Negative for dizziness and headaches.  All other systems reviewed and are negative.  Today's Vitals   02/16/22 1349  BP: 130/72  Pulse: 62  Temp: 97.8 F (36.6 C)   TempSrc: Oral  SpO2: 99%  Weight: 215 lb (97.5 kg)  Height: '6\' 2"'$  (1.88 m)   Body mass index is 27.6 kg/m.   Physical Exam Vitals reviewed.  Constitutional:      Appearance: Normal appearance.  HENT:     Head: Normocephalic.     Mouth/Throat:     Mouth: Mucous membranes are moist.     Pharynx: Oropharynx is clear.  Eyes:     Extraocular Movements: Extraocular movements intact.     Pupils: Pupils are equal, round, and reactive to light.  Cardiovascular:     Rate and Rhythm: Normal rate and regular rhythm.     Heart sounds: Normal heart sounds.     Comments: Decreased peripheral pulses distally Pulmonary:     Effort: Pulmonary effort is normal.     Breath sounds: Normal breath sounds.  Abdominal:     Palpations: Abdomen is soft.     Tenderness: There is no abdominal tenderness.  Musculoskeletal:        General: Normal range of motion.     Cervical back: No tenderness.     Right lower leg: No edema.     Left lower leg: No edema.  Lymphadenopathy:     Cervical: No cervical adenopathy.  Skin:    General: Skin is warm and dry.  Neurological:     General: No focal deficit present.     Mental Status: He is alert and oriented to person, place, and time.  Psychiatric:        Mood and Affect: Mood normal.        Behavior: Behavior normal.    DG Chest 2 View  Result Date: 02/16/2022 CLINICAL DATA:  On exertion.  History of asthma. EXAM: CHEST - 2 VIEW COMPARISON:  January 11, 2018 FINDINGS: The heart size and mediastinal contours are within normal limits. Both  lungs are clear. The visualized skeletal structures are unremarkable. IMPRESSION: No active cardiopulmonary disease. Electronically Signed   By: Dorise Bullion III M.D.   On: 02/16/2022 15:09     ASSESSMENT & PLAN: A total of 50 minutes was spent with the patient and counseling/coordination of care regarding preparing for this visit, review of most recent office visit notes, review of multiple chronic medical  problems under treatment, review of all medications, review of today's chest x-ray report, review of most recent blood work results, education on nutrition, possibility of peripheral vascular disease and need for work-up, prognosis, documentation, and need for follow-up.  Problem List Items Addressed This Visit       Cardiovascular and Mediastinum   CAD- LAD BMS '97, RCA DES 08/02/13 (Chronic)    Stable and well-controlled.  No recent use of sublingual nitroglycerin.  No recent anginal episodes.      Essential hypertension - Primary (Chronic)    Well-controlled hypertension. Continue olmesartan 20 mg daily. BP Readings from Last 3 Encounters:  02/16/22 130/72  01/01/22 124/72  11/12/21 126/70           Respiratory   Moderate persistent asthma, uncomplicated    Stable.  Continue Breztri twice a day.        Other   Dyslipidemia    Stable.  Diet and nutrition discussed. Continue atorvastatin 40 mg daily.      Dyspnea on exertion    Normal chest x-ray.  No symptoms of congestive heart failure.  No red flag signs or symptoms.      Relevant Orders   DG Chest 2 View (Completed)   Intermittent claudication (HCC)    Clinical symptoms affecting quality of life. Needs vascular Doppler ultrasound to rule out peripheral vascular disease      Relevant Orders   [facility] LOWER EXTREMITY ARTERIAL DUPLEX BILAT (BACK OFFICE)   Chronic right shoulder pain    Chronic and affecting quality of life. Needs orthopedic evaluation. Sports medicine referral placed today.      Relevant Orders   Ambulatory referral to Sports Medicine   Patient Instructions  Shoulder Pain Many things can cause shoulder pain, including: An injury. Moving the shoulder in the same way again and again (overuse). Joint pain (arthritis). Pain can come from: Swelling and irritation (inflammation) of any part of the shoulder. An injury to the shoulder joint. An injury to: Tissues that connect muscle to bone  (tendons). Tissues that connect bones to each other (ligaments). Bones. Follow these instructions at home: Watch for changes in your symptoms. Let your doctor know about them. Follow these instructions to help with your pain. If you have a sling: Wear the sling as told by your doctor. Remove it only as told by your doctor. Loosen the sling if your fingers: Tingle. Become numb. Turn cold and blue. Keep the sling clean. If the sling is not waterproof: Do not let it get wet. Take the sling off when you shower or bathe. Managing pain, stiffness, and swelling  If told, put ice on the painful area: Put ice in a plastic bag. Place a towel between your skin and the bag. Leave the ice on for 20 minutes, 2-3 times a day. Stop putting ice on if it does not help with the pain. Squeeze a soft ball or a foam pad as much as possible. This prevents swelling in the shoulder. It also helps to strengthen the arm. General instructions Take over-the-counter and prescription medicines only as told by your  doctor. Keep all follow-up visits as told by your doctor. This is important. Contact a doctor if: Your pain gets worse. Medicine does not help your pain. You have new pain in your arm, hand, or fingers. Get help right away if: Your arm, hand, or fingers: Tingle. Are numb. Are swollen. Are painful. Turn white or blue. Summary Shoulder pain can be caused by many things. These include injury, moving the shoulder in the same away again and again, and joint pain. Watch for changes in your symptoms. Let your doctor know about them. This condition may be treated with a sling, ice, and pain medicine. Contact your doctor if the pain gets worse or you have new pain. Get help right away if your arm, hand, or fingers tingle or get numb, swollen, or painful. Keep all follow-up visits as told by your doctor. This is important. This information is not intended to replace advice given to you by your health care  provider. Make sure you discuss any questions you have with your health care provider. Document Revised: 01/17/2021 Document Reviewed: 01/17/2021 Elsevier Patient Education  [place], MD [facility]

## 2022-02-16 NOTE — Assessment & Plan Note (Signed)
Chronic and affecting quality of life. Needs orthopedic evaluation. Sports medicine referral placed today. 

## 2022-02-16 NOTE — Assessment & Plan Note (Signed)
Normal chest x-ray.  No symptoms of congestive heart failure.  No red flag signs or symptoms.

## 2022-02-16 NOTE — Patient Instructions (Signed)

## 2022-02-16 NOTE — Assessment & Plan Note (Signed)
Stable.  Diet and nutrition discussed. Continue atorvastatin 40 mg daily. 

## 2022-02-16 NOTE — Assessment & Plan Note (Signed)
Stable.  Continue Breztri twice a day.

## 2022-02-16 NOTE — Assessment & Plan Note (Signed)
Stable and well-controlled.  No recent use of sublingual nitroglycerin.  No recent anginal episodes.

## 2022-02-17 ENCOUNTER — Ambulatory Visit (INDEPENDENT_AMBULATORY_CARE_PROVIDER_SITE_OTHER): Payer: Medicare Other

## 2022-02-17 ENCOUNTER — Ambulatory Visit (INDEPENDENT_AMBULATORY_CARE_PROVIDER_SITE_OTHER): Payer: Medicare Other | Admitting: Sports Medicine

## 2022-02-17 VITALS — BP 130/80 | HR 76 | Ht 74.0 in | Wt 215.0 lb

## 2022-02-17 DIAGNOSIS — G8929 Other chronic pain: Secondary | ICD-10-CM

## 2022-02-17 DIAGNOSIS — M25511 Pain in right shoulder: Secondary | ICD-10-CM

## 2022-02-17 NOTE — Progress Notes (Signed)
Adam Peters D.[place] Phone: [phone]   Assessment and Plan:     1. Chronic right shoulder pain -Chronic with exacerbation, initial sports medicine visit - Unclear etiology of right shoulder pain x8 months.  Differential includes rotator cuff tendinitis versus biceps tendinitis versus strain of pectoralis musculature - Patient elected for subacromial CSI.  Tolerated well per note below - Do not recommend NSAID use with past medical history of cardiac stenting - Start HEP.  Recommend 1 week rest from upper extremity workouts.  May restart in week 2, starting at 50% and if asymptomatic may increase to 75% and if asymptomatic may increase to 100% - DG Shoulder Right; Future  -X-ray obtained in clinic.  My interpretation: No acute fracture or dislocation.  Mild AC arthritis  Procedure: Subacromial Injection Side: Right  Risks explained and consent was given verbally. The site was cleaned with alcohol prep. A steroid injection was performed from posterior approach using 62m of 1% lidocaine without epinephrine and 185mof kenalog '40mg'$ /ml. This was well tolerated and resulted in symptomatic relief.  Needle was removed, hemostasis achieved, and post injection instructions were explained.   Pt was advised to call or return to clinic if these symptoms worsen or fail to improve as anticipated.   Pertinent previous records reviewed include internal medicine note 02/16/2022, CXR 02/16/2022   Follow Up: 3 weeks for reevaluation.  If no improvement or worsening of symptoms, could consider ultrasound versus MRI versus biceps tendon sheath CSI   Subjective:   I, Adam Peters, am serving as a scEducation administratoror Doctor BeGlennon MacChief Complaint: right shoulder pain   HPI:   02/17/22 Patient is a [age]ear old male complaining of right shoulder pain. Patient states  taht he lifts weight and when he is doing a chest press  he has pain from the shoulder down to his bicep , been going for some time, no meds for the pain , has a heart stent, normal Rom its only when he has weights in his hand, he feels better when he pulls down doing lats   Relevant Historical Information: History of cardiac stent, hypertension  Additional pertinent review of systems negative.   Current Outpatient Medications:    aspirin EC 81 MG tablet, Take 81 mg by mouth daily., Disp: , Rfl:    atorvastatin (LIPITOR) 40 MG tablet, Take 1 tablet (40 mg total) by mouth daily., Disp: 90 tablet, Rfl: 3   azelastine (ASTELIN) 0.1 % nasal spray, Place 1 spray into both nostrils 2 (two) times daily. Use in each nostril as directed, Disp: 90 mL, Rfl: 1   BREZTRI AEROSPHERE 160-9-4.8 MCG/ACT AERO, USE 2 INHALATIONS IN THE MORNING AND AT BEDTIME, Disp: 32.1 g, Rfl: 1   buPROPion (WELLBUTRIN XL) 150 MG 24 hr tablet, Take 150 mg by mouth every morning., Disp: , Rfl:    Cholecalciferol (VITAMIN D) 50 MCG (2000 UT) tablet, Take by mouth., Disp: , Rfl:    escitalopram (LEXAPRO) 20 MG tablet, Take 25 mg by mouth daily., Disp: , Rfl:    fexofenadine-pseudoephedrine (ALLEGRA-D 24) 180-240 MG 24 hr tablet, Take 1 tablet by mouth daily. , Disp: , Rfl:    fluticasone (FLONASE) 50 MCG/ACT nasal spray, USE 2 SPRAYS IN EACH NOSTRIL DAILY, Disp: 16 g, Rfl: 5   nitroGLYCERIN (NITROSTAT) 0.4 MG SL tablet, DISSOLVE 1 TABLET UNDER THE TONGUE EVERY 5 MINUTES AS NEEDED FOR CHEST PAIN, Disp: 25  tablet, Rfl: 11   olmesartan (BENICAR) 20 MG tablet, Take 1 tablet (20 mg total) by mouth daily., Disp: 90 tablet, Rfl: 1   omeprazole (PRILOSEC) 20 MG capsule, TAKE 2 CAPSULES TWICE A DAY BEFORE MEALS, Disp: 360 capsule, Rfl: 3   silodosin (RAPAFLO) 8 MG CAPS capsule, Take 8 mg by mouth daily., Disp: , Rfl:    Objective:     Vitals:   02/17/22 0825  BP: 130/80  Pulse: 76  SpO2: 97%  Weight: 215 lb (97.5 kg)  Height: '6\' 2"'$  (1.88 m)      Body mass index is 27.6 kg/m.     Physical Exam:    Gen: Appears well, nad, nontoxic and pleasant Neuro:sensation intact, strength is 5/5 with df/pf/inv/ev, muscle tone wnl Skin: no suspicious lesion or defmority Psych: A&O, appropriate mood and affect  Right shoulder: no deformity, swelling or muscle wasting No scapular winging FF 180, abd 180, int 0, ext 90 NTTP over the [place], clavicle, ac, coracoid, biceps groove, humerus, deltoid, trapezius, cervical spine Positive speeds Neg neer, hawkings, empty can, subscap liftoff, yergenson, obriens, crossarm Neg ant drawer, sulcus sign, apprehension Negative Spurling's test bilat FROM of neck    Electronically signed by:  Adam Peters D.[facility] 8:49 AM 02/17/22

## 2022-02-17 NOTE — Patient Instructions (Addendum)
Good to see yo After 1 week can restart workouts at 50 % if no symptoms the next work out 75 % and then 100 %  Shoulder HEP 3 week follow up

## 2022-02-24 ENCOUNTER — Other Ambulatory Visit: Payer: Self-pay | Admitting: Emergency Medicine

## 2022-02-24 DIAGNOSIS — I10 Essential (primary) hypertension: Secondary | ICD-10-CM

## 2022-02-25 ENCOUNTER — Encounter: Payer: Self-pay | Admitting: Emergency Medicine

## 2022-03-03 ENCOUNTER — Encounter: Payer: Self-pay | Admitting: Emergency Medicine

## 2022-03-03 ENCOUNTER — Ambulatory Visit (INDEPENDENT_AMBULATORY_CARE_PROVIDER_SITE_OTHER): Payer: Medicare Other | Admitting: Emergency Medicine

## 2022-03-03 VITALS — BP 122/68 | HR 70 | Temp 98.0°F | Ht 74.0 in | Wt 222.2 lb

## 2022-03-03 DIAGNOSIS — I739 Peripheral vascular disease, unspecified: Secondary | ICD-10-CM

## 2022-03-03 DIAGNOSIS — I10 Essential (primary) hypertension: Secondary | ICD-10-CM

## 2022-03-03 DIAGNOSIS — I2510 Atherosclerotic heart disease of native coronary artery without angina pectoris: Secondary | ICD-10-CM | POA: Diagnosis not present

## 2022-03-03 DIAGNOSIS — E785 Hyperlipidemia, unspecified: Secondary | ICD-10-CM | POA: Diagnosis not present

## 2022-03-03 NOTE — Assessment & Plan Note (Signed)
Well-controlled hypertension. Continue olmesartan 20 mg daily and amlodipine 10 mg daily. BP Readings from Last 3 Encounters:  03/03/22 122/68  02/17/22 130/80  02/16/22 130/72

## 2022-03-03 NOTE — Assessment & Plan Note (Signed)
Active and affecting quality of life. Continue daily baby aspirin as well as blood pressure control and cholesterol medication. Needs evaluation by vascular surgeon. Referral placed today. Continue amlodipine 10 mg and daily baby aspirin.

## 2022-03-03 NOTE — Progress Notes (Signed)
[name] 71 y.o.   Chief Complaint  Patient presents with   Follow-up    Patient having issues with his calf muscles, he states his calves are achy, cramps.     HISTORY OF PRESENT ILLNESS: This is a 70 y.o. male with history of hypertension seen by me on 02/16/2022 and clinically diagnosed with peripheral vascular disease after complaining of intermittent claudication symptoms.  Had arterial Doppler ultrasounds done last week at the [facility] and diagnosed with peripheral artery disease. Needs referral for vascular specialist.  HPI   Prior to Admission medications   Medication Sig Start Date End Date Taking? Authorizing Provider  amLODipine (NORVASC) 10 MG tablet Take 10 mg by mouth daily.   Yes [provider]  aspirin EC 81 MG tablet Take 81 mg by mouth daily.   Yes [provider]  atorvastatin (LIPITOR) 40 MG tablet Take 1 tablet (40 mg total) by mouth daily. 08/14/21  Yes [name], Nadean Corwin, MD  azelastine (ASTELIN) 0.1 % nasal spray Place 1 spray into both nostrils 2 (two) times daily. Use in each nostril as directed 12/05/21  Yes Valentina Shaggy, MD  BREZTRI AEROSPHERE 160-9-4.8 MCG/ACT AERO USE 2 INHALATIONS IN THE MORNING AND AT BEDTIME 10/23/21  Yes Valentina Shaggy, MD  buPROPion (WELLBUTRIN XL) 150 MG 24 hr tablet Take 150 mg by mouth every morning. 12/10/19  Yes [provider]  Cholecalciferol (VITAMIN D) 50 MCG (2000 UT) tablet Take by mouth. 12/03/20  Yes [provider]  escitalopram (LEXAPRO) 20 MG tablet Take 25 mg by mouth daily.   Yes [provider]  fexofenadine-pseudoephedrine (ALLEGRA-D 24) 180-240 MG 24 hr tablet Take 1 tablet by mouth daily.    Yes [provider]  fluticasone (FLONASE) 50 MCG/ACT nasal spray USE 2 SPRAYS IN EACH NOSTRIL DAILY 07/03/21  Yes Valentina Shaggy, MD  nitroGLYCERIN (NITROSTAT) 0.4 MG SL tablet DISSOLVE 1 TABLET UNDER THE TONGUE EVERY 5 MINUTES AS NEEDED FOR CHEST  PAIN 09/16/21  Yes [name], Nadean Corwin, MD  olmesartan (BENICAR) 20 MG tablet TAKE 1 TABLET DAILY 02/24/22  Yes Horald Pollen, MD  omeprazole (PRILOSEC) 20 MG capsule TAKE 2 CAPSULES TWICE A DAY BEFORE MEALS 09/15/21  Yes [name], Nadean Corwin, MD  silodosin (RAPAFLO) 8 MG CAPS capsule Take 8 mg by mouth daily. 09/19/21  Yes [provider]    Allergies  Allergen Reactions   Amoxicillin Diarrhea    GI upset.    Patient Active Problem List   Diagnosis Date Noted   Intermittent claudication ([place]) 02/16/2022   Chronic right shoulder pain 02/16/2022   BPH with obstruction/lower urinary tract symptoms 09/08/2021   Moderate persistent asthma, uncomplicated 16/02/9603   OSA on CPAP 03/10/2021   Asthma 10/03/2020   Seasonal and perennial allergic rhinitis 04/04/2019   Ex-smoker 04/04/2019   Mild persistent asthma, uncomplicated 54/01/8118   Dyspnea on exertion 01/23/2016   Essential hypertension    Dyslipidemia    History of tobacco use 07/08/2012   Glaucoma 04/08/2012   DDD (degenerative disc disease), cervical    CAD- LAD BMS '97, RCA DES 08/02/13 08/17/2011    Past Medical History:  Diagnosis Date   Allergy    seasonal   Anemia    Anxiety    Asthma    CAD (coronary artery disease)    a. 1997 PCI/BMS to distal LAD (J&J PS BMS);  b. 03/2011 Neg MV;  c. 07/2013 NSTEMI/PCI: LM 20d, LAD 55m 50d isr, LCX  nl, OM1/2 nl, RCA dom 95-55m(3.5x16 Promus premier DES), EF 55%, mild basal inf HK.   Cataract    Coronary artery disease    s/p PCI to LAD (1997) - Dr. ARockne Menghini  GERD (gastroesophageal reflux disease)    Hepatitis C    History of tobacco use    Hyperlipidemia    Hypertension    Schizophrenia ([place] 1978    Past Surgical History:  [place]cedure [place]ality Date   [place]   patent LAD stent (Dr RGeorges Lynch   [place]   prox LAD stenting 80% to 0% (Dr. ARockne Menghini - repeat cath in 11/1999 showed  no significant coronary disease & normal systolic function (Dr. RGerrie Nordmann   CORONARY STENT PLACEMENT  08/02/2013   RCA   DES         DR [place]  knife wound repair  [place] COR[place] ANGIO[place] N/A 08/02/2013   Procedure: LEFT HEART CATHETERIZATION WITH CORONARY ANGIOGRAM;  Surgeon: MBlane Ohara MD;  [facility];  Service: Cardiovascular;  Laterality: N/A;   TRANSTHORACIC ECHOCARDIOGRAM  11/13/2008   EF 50-55%, normal LV systolic function; RA mildly dilated; trace MR/TR/PR    Social History   Socioeconomic History   Marital status: Married    Spouse name: Not on file   Number of children: Not on file   Years of education: B.A.   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  Education 4 yr college.  WorK: retired.     Social Determinants of Health   Financial Resource Strain: Low Risk  (11/12/2021)   Overall Financial Resource Strain (CARDIA)    Difficulty of Paying Living Expenses: Not hard at all  Food Insecurity: No Food Insecurity (11/12/2021)   Hunger Vital Sign    Worried About Running Out of Food in the Last Year: Never true    Ran Out of Food in the Last Year: Never true  Transportation Needs: No Transportation Needs (11/12/2021)   PRAPARE - THydrologist(Medical): No    Lack of Transportation (Non-Medical): No  Physical  Activity: Sufficiently Active (11/12/2021)   Exercise Vital Sign    Days of Exercise per Week: 7 days    Minutes of Exercise per Session: 60 min  Stress: No Stress Concern Present (11/12/2021)   [place]   Feeling of Stress : Not at all  Social Connections: SBrack[place](11/12/2021)   Social Connection and Isolation Panel [NHANES]    Frequency of Communication with Friends and Family: More than three times a week    Frequency of Social Gatherings  with Friends and Family: More than three times a week    Attends Religious Services: More than 4 times per year    Active Member of Clubs or Organizations: Yes    Attends Archivist Meetings: More than 4 times per year    Marital Status: Married  Human resources officer Violence: Not At Risk (11/12/2021)   Humiliation, Afraid, Rape, and Kick questionnaire    Fear of Current or Ex-Partner: No    Emotionally Abused: No    Physically Abused: No    Sexually Abused: No    Family History  Problem Relation Age of Onset   Asthma Mother    Crohn's disease Mother    Heart disease Mother    Hepatitis C Brother    Testicular cancer Brother    Dementia Father    Diabetes Maternal Grandmother    Emphysema Maternal Aunt    Cancer Maternal Aunt    Colon cancer Neg Hx      Review of Systems  Constitutional: Negative.  Negative for chills and fever.  HENT: Negative.  Negative for congestion and sore throat.   Respiratory: Negative.  Negative for cough and shortness of breath.   Cardiovascular:  Positive for claudication. Negative for chest pain.  Gastrointestinal:  Negative for abdominal pain, diarrhea, nausea and vomiting.  Skin: Negative.  Negative for rash.  Neurological: Negative.  Negative for dizziness and headaches.  All other systems reviewed and are negative.  Today's Vitals   03/03/22 1429  BP: 122/68  Pulse: 70  Temp: 98 F (36.7 C)  TempSrc: Oral   SpO2: 95%  Weight: 222 lb 4 oz (100.8 kg)  Height: '6\' 2"'$  (1.88 m)   Body mass index is 28.54 kg/m.   Physical Exam Vitals reviewed.  Constitutional:      Appearance: Normal appearance.  HENT:     Head: Normocephalic.     Mouth/Throat:     Mouth: Mucous membranes are moist.     Pharynx: Oropharynx is clear.  Eyes:     Extraocular Movements: Extraocular movements intact.     Conjunctiva/sclera: Conjunctivae normal.     Pupils: Pupils are equal, round, and reactive to light.  Cardiovascular:     Rate and Rhythm: Normal rate and regular rhythm.     Pulses: Normal pulses.     Heart sounds: Normal heart sounds.  Pulmonary:     Effort: Pulmonary effort is normal.     Breath sounds: Normal breath sounds.  Skin:    General: Skin is warm and dry.  Neurological:     General: No focal deficit present.     Mental Status: He is alert and oriented to person, place, and time.  Psychiatric:        Mood and Affect: Mood normal.        Behavior: Behavior normal.      ASSESSMENT & PLAN: A total of 45 minutes was spent with the patient and counseling/coordination of care regarding preparing for this visit, review of most recent office visit notes, review of most recent blood work results, review of multiple chronic medical problems and their management, review of all medications, need for vascular surgeon evaluation, prognosis, documentation, and need for follow-up.  Problem List Items Addressed This Visit       Cardiovascular and Mediastinum   Essential hypertension (Chronic)    Well-controlled hypertension. Continue olmesartan 20 mg daily and amlodipine 10 mg daily. BP Readings from Last 3 Encounters:  03/03/22 122/68  02/17/22 130/80  02/16/22 130/72         Relevant Medications   amLODipine (NORVASC) 10 MG tablet   Peripheral arterial disease (HCC) - Primary    Active and affecting quality of life. Continue daily baby aspirin as well as blood pressure control and  cholesterol medication. Needs evaluation by vascular surgeon. Referral placed today. Continue amlodipine 10 mg and daily baby aspirin.      Relevant Medications   amLODipine (NORVASC) 10 MG tablet   Other Relevant Orders   Ambulatory referral to Vascular Surgery     Other   Dyslipidemia    Stable.  Diet and nutrition discussed. Continue atorvastatin 40 mg daily.      Intermittent claudication (HCC)    Active and affecting quality of life. Recent arterial Doppler ultrasound of lower extremities confirms diagnosis of peripheral artery disease but report not available at present time.      Relevant Orders   Ambulatory referral to Vascular Surgery   Patient Instructions  Intermittent Claudication Intermittent claudication is pain in one leg or both legs that occurs when walking or exercising and goes away when resting. This condition is a symptom of peripheral vascular disease (PVD). PVD is a disease of the blood vessels. This condition is commonly treated with physical activity, medicine, and lifestyle changes. If medical management does not improve symptoms, surgery may be done to restore blood flow. This surgery is called revascularization. What are the causes?  This condition is caused by a buildup of fatty material and other substances (plaque) within the arteries (atherosclerosis). Plaque makes arteries stiff and narrow, preventing proper blood flow to the leg muscles. Pain occurs when you walk or exercise because your muscles need more blood when you are moving and exercising but cannot get it because of poor blood flow. What increases the risk? The following factors may make you more likely to develop this condition: Smoking cigarettes. A personal history of stroke or heart disease. Age. The older you are, the higher the risk. Being inactive (sedentary lifestyle) or being overweight. A family history of atherosclerosis. Having another health condition, such  as: Diabetes. High blood pressure. High cholesterol. What are the signs or symptoms? Symptoms of this condition can be in one leg or both legs, and can occur in the feet, calf, thigh, hip, or buttock over time. Symptoms may include: Aches or pains when walking. Cramps. A feeling of tightness, weakness, or heaviness. A wound on the lower leg or foot that heals poorly or does not heal. How is this diagnosed? This condition may be diagnosed based on: Your symptoms. Your medical history. A physical exam. Tests, such as: Ankle-brachial index (ABI) to check blood pressure in the legs and compare it to the pressure in the arms. Exercise ankle-brachial test. For this test, you walk on a treadmill. Tests are done to check how the condition affects your ability to walk or exercise. Arterial duplex ultrasound to view how blood flows within arteries. CT angiogram (CTA). An X-ray machine is used to take pictures of the blood vessels after dye is injected. Magnetic resonance angiogram (MRA). This creates images of blood vessels and blood flow within them. Angiogram. In this procedure, dye is injected into arteries and then X-rays are taken. Blood tests. How is this treated? Treatment for this condition involves treating the underlying cause and managing risk factors, such as high blood pressure, high cholesterol, or diabetes. Treatment may include: Lifestyle changes, such as: Starting a supervised or home-based exercise program. Losing weight. Quitting  smoking. Medicines to help restore blood flow through your legs. If you have symptoms that affect your everyday activities, or have a wound that is not healing, treatment may include: Angioplasty to open a blocked artery using an inflated balloon. Stent implant to open a blocked artery using a mesh-like tube. Surgery to restore blood flow by creating a bypass around a blocked area. Follow these instructions at home: Lifestyle  Maintain a healthy  weight. Eat a diet that is low in saturated fats and calories. Consider working with a dietitianto help you make healthy food choices. Do not use any products that contain nicotine or tobacco. These products include cigarettes, chewing tobacco, and vaping devices, such as e-cigarettes. If you need help quitting, ask your health care provider. If your health care provider recommended an exercise program for you, follow it as directed. Your exercise program may involve: Walking three or more times a week. Walking until you have certain symptoms of intermittent claudication, resting until your symptoms go away, and then resuming your walk. Gradually increasing your total walking time to about 50 minutes a day. General instructions Work with your health care provider to manage other health conditions that may increase your risk, including diabetes, high blood pressure, or high cholesterol. Take over-the-counter and prescription medicines only as told by your health care provider. Keep all follow-up visits. This is important. Contact a health care provider if: Your pain does not go away with rest. You have sores on your legs that do not heal or have pus or a bad smell. Your condition gets worse or does not improve with treatment. Get help right away if: You have chest pain. You have trouble breathing. Your foot or leg is cold or it changes color. Your foot or leg becomes numb. You have any symptoms of a stroke. "BE FAST" is an easy way to remember the main warning signs of a stroke: B - Balance. Signs are dizziness, sudden trouble walking, or loss of balance. E - Eyes. Signs are trouble seeing or a sudden change in vision. F - Face. Signs are sudden weakness or numbness of the face, or the face or eyelid drooping on one side. A - Arms. Signs are weakness or numbness in an arm. This happens suddenly and usually on one side of the body. S - Speech. Signs are sudden trouble speaking, slurred speech,  or trouble understanding what people say. T - Time. Time to call emergency services. Write down what time symptoms started. You have other signs of a stroke, such as: A sudden, severe headache with no known cause. Nausea or vomiting. Seizure. These symptoms may represent a serious problem that is an emergency. Do not wait to see if the symptoms resolve. Get medical help right away. Call your local emergency services (911 in the [place]). Do not drive yourself to the hospital.  Summary Intermittent claudication is pain in the leg or legs that occurs when walking or exercising and goes away when resting. This condition is caused by a buildup of plaque in the arteries. Plaque makes arteries stiff and narrow, which prevents proper blood flow to the leg. Intermittent claudication can be treated with medicine and lifestyle changes. If these fail, surgery may be done to restore blood flow to the affected area. Work with your health care provider to manage other health conditions you may have, including diabetes, high blood pressure, or high cholesterol. This information is not intended to replace advice given to you by your health care provider.  Make sure you discuss any questions you have with your health care provider. Document Revised: 11/06/2019 Document Reviewed: 11/06/2019 Elsevier Patient Education  La Ward, MD [facility]

## 2022-03-03 NOTE — Assessment & Plan Note (Signed)
Active and affecting quality of life. Recent arterial Doppler ultrasound of lower extremities confirms diagnosis of peripheral artery disease but report not available at present time.

## 2022-03-03 NOTE — Assessment & Plan Note (Signed)
Stable.  Diet and nutrition discussed. Continue atorvastatin 40 mg daily. 

## 2022-03-03 NOTE — Patient Instructions (Signed)
Intermittent Claudication Intermittent claudication is pain in one leg or both legs that occurs when walking or exercising and goes away when resting. This condition is a symptom of peripheral vascular disease (PVD). PVD is a disease of the blood vessels. This condition is commonly treated with physical activity, medicine, and lifestyle changes. If medical management does not improve symptoms, surgery may be done to restore blood flow. This surgery is called revascularization. What are the causes?  This condition is caused by a buildup of fatty material and other substances (plaque) within the arteries (atherosclerosis). Plaque makes arteries stiff and narrow, preventing proper blood flow to the leg muscles. Pain occurs when you walk or exercise because your muscles need more blood when you are moving and exercising but cannot get it because of poor blood flow. What increases the risk? The following factors may make you more likely to develop this condition: Smoking cigarettes. A personal history of stroke or heart disease. Age. The older you are, the higher the risk. Being inactive (sedentary lifestyle) or being overweight. A family history of atherosclerosis. Having another health condition, such as: Diabetes. High blood pressure. High cholesterol. What are the signs or symptoms? Symptoms of this condition can be in one leg or both legs, and can occur in the feet, calf, thigh, hip, or buttock over time. Symptoms may include: Aches or pains when walking. Cramps. A feeling of tightness, weakness, or heaviness. A wound on the lower leg or foot that heals poorly or does not heal. How is this diagnosed? This condition may be diagnosed based on: Your symptoms. Your medical history. A physical exam. Tests, such as: Ankle-brachial index (ABI) to check blood pressure in the legs and compare it to the pressure in the arms. Exercise ankle-brachial test. For this test, you walk on a treadmill.  Tests are done to check how the condition affects your ability to walk or exercise. Arterial duplex ultrasound to view how blood flows within arteries. CT angiogram (CTA). An X-ray machine is used to take pictures of the blood vessels after dye is injected. Magnetic resonance angiogram (MRA). This creates images of blood vessels and blood flow within them. Angiogram. In this procedure, dye is injected into arteries and then X-rays are taken. Blood tests. How is this treated? Treatment for this condition involves treating the underlying cause and managing risk factors, such as high blood pressure, high cholesterol, or diabetes. Treatment may include: Lifestyle changes, such as: Starting a supervised or home-based exercise program. Losing weight. Quitting smoking. Medicines to help restore blood flow through your legs. If you have symptoms that affect your everyday activities, or have a wound that is not healing, treatment may include: Angioplasty to open a blocked artery using an inflated balloon. Stent implant to open a blocked artery using a mesh-like tube. Surgery to restore blood flow by creating a bypass around a blocked area. Follow these instructions at home: Lifestyle  Maintain a healthy weight. Eat a diet that is low in saturated fats and calories. Consider working with a dietitianto help you make healthy food choices. Do not use any products that contain nicotine or tobacco. These products include cigarettes, chewing tobacco, and vaping devices, such as e-cigarettes. If you need help quitting, ask your health care provider. If your health care provider recommended an exercise program for you, follow it as directed. Your exercise program may involve: Walking three or more times a week. Walking until you have certain symptoms of intermittent claudication, resting until your symptoms go   away, and then resuming your walk. Gradually increasing your total walking time to about 50 minutes  a day. General instructions Work with your health care provider to manage other health conditions that may increase your risk, including diabetes, high blood pressure, or high cholesterol. Take over-the-counter and prescription medicines only as told by your health care provider. Keep all follow-up visits. This is important. Contact a health care provider if: Your pain does not go away with rest. You have sores on your legs that do not heal or have pus or a bad smell. Your condition gets worse or does not improve with treatment. Get help right away if: You have chest pain. You have trouble breathing. Your foot or leg is cold or it changes color. Your foot or leg becomes numb. You have any symptoms of a stroke. "BE FAST" is an easy way to remember the main warning signs of a stroke: B - Balance. Signs are dizziness, sudden trouble walking, or loss of balance. E - Eyes. Signs are trouble seeing or a sudden change in vision. F - Face. Signs are sudden weakness or numbness of the face, or the face or eyelid drooping on one side. A - Arms. Signs are weakness or numbness in an arm. This happens suddenly and usually on one side of the body. S - Speech. Signs are sudden trouble speaking, slurred speech, or trouble understanding what people say. T - Time. Time to call emergency services. Write down what time symptoms started. You have other signs of a stroke, such as: A sudden, severe headache with no known cause. Nausea or vomiting. Seizure. These symptoms may represent a serious problem that is an emergency. Do not wait to see if the symptoms resolve. Get medical help right away. Call your local emergency services (911 in the [place]). Do not drive yourself to the hospital.  Summary Intermittent claudication is pain in the leg or legs that occurs when walking or exercising and goes away when resting. This condition is caused by a buildup of plaque in the arteries. Plaque makes arteries stiff and  narrow, which prevents proper blood flow to the leg. Intermittent claudication can be treated with medicine and lifestyle changes. If these fail, surgery may be done to restore blood flow to the affected area. Work with your health care provider to manage other health conditions you may have, including diabetes, high blood pressure, or high cholesterol. This information is not intended to replace advice given to you by your health care provider. Make sure you discuss any questions you have with your health care provider. Document Revised: 11/06/2019 Document Reviewed: 11/06/2019 Elsevier Patient Education  2023 Elsevier Inc.  

## 2022-03-04 ENCOUNTER — Ambulatory Visit (INDEPENDENT_AMBULATORY_CARE_PROVIDER_SITE_OTHER): Payer: Medicare Other | Admitting: Vascular Surgery

## 2022-03-04 ENCOUNTER — Encounter: Payer: Self-pay | Admitting: Vascular Surgery

## 2022-03-04 VITALS — BP 146/82 | HR 80 | Temp 98.0°F | Resp 20 | Ht 74.0 in | Wt 217.0 lb

## 2022-03-04 DIAGNOSIS — I70213 Atherosclerosis of native arteries of extremities with intermittent claudication, bilateral legs: Secondary | ICD-10-CM | POA: Diagnosis not present
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
## 2022-03-06 NOTE — Progress Notes (Unsigned)
    Benito Mccreedy D.[facility] [place] Phone: [phone]   Assessment and Plan:     There are no diagnoses linked to this encounter.  ***   Pertinent previous records reviewed include ***   Follow Up: ***     Subjective:   I, Adam Peters, am serving as a Education administrator for Doctor Glennon Mac   Chief Complaint: right shoulder pain    HPI:    02/17/22 Patient is a 71 year old male complaining of right shoulder pain. Patient states  taht he lifts weight and when he is doing a chest press he has pain from the shoulder down to his bicep , been going for some time, no meds for the pain , has a heart stent, normal Rom its only when he has weights in his hand, he feels better when he pulls down doing lats   03/09/2022 Patient states   Relevant Historical Information: History of cardiac stent, hypertension  Additional pertinent review of systems negative.   Current Outpatient Medications:    amLODipine (NORVASC) 10 MG tablet, Take 10 mg by mouth daily., Disp: , Rfl:    aspirin EC 81 MG tablet, Take 81 mg by mouth daily., Disp: , Rfl:    atorvastatin (LIPITOR) 40 MG tablet, Take 1 tablet (40 mg total) by mouth daily., Disp: 90 tablet, Rfl: 3   azelastine (ASTELIN) 0.1 % nasal spray, Place 1 spray into both nostrils 2 (two) times daily. Use in each nostril as directed, Disp: 90 mL, Rfl: 1   BREZTRI AEROSPHERE 160-9-4.8 MCG/ACT AERO, USE 2 INHALATIONS IN THE MORNING AND AT BEDTIME, Disp: 32.1 g, Rfl: 1   buPROPion (WELLBUTRIN XL) 150 MG 24 hr tablet, Take 150 mg by mouth every morning., Disp: , Rfl:    Cholecalciferol (VITAMIN D) 50 MCG (2000 UT) tablet, Take by mouth., Disp: , Rfl:    escitalopram (LEXAPRO) 20 MG tablet, Take 25 mg by mouth daily., Disp: , Rfl:    fexofenadine-pseudoephedrine (ALLEGRA-D 24) 180-240 MG 24 hr tablet, Take 1 tablet by mouth daily. , Disp: , Rfl:    fluticasone (FLONASE) 50 MCG/ACT nasal  spray, USE 2 SPRAYS IN EACH NOSTRIL DAILY, Disp: 16 g, Rfl: 5   nitroGLYCERIN (NITROSTAT) 0.4 MG SL tablet, DISSOLVE 1 TABLET UNDER THE TONGUE EVERY 5 MINUTES AS NEEDED FOR CHEST PAIN, Disp: 25 tablet, Rfl: 11   olmesartan (BENICAR) 20 MG tablet, TAKE 1 TABLET DAILY, Disp: 90 tablet, Rfl: 3   omeprazole (PRILOSEC) 20 MG capsule, TAKE 2 CAPSULES TWICE A DAY BEFORE MEALS, Disp: 360 capsule, Rfl: 3   silodosin (RAPAFLO) 8 MG CAPS capsule, Take 8 mg by mouth daily., Disp: , Rfl:    Objective:     There were no vitals filed for this visit.    There is no height or weight on file to calculate BMI.    Physical Exam:    ***   Electronically signed by:  Benito Mccreedy D.[facility] 7:40 AM 03/06/22

## 2022-03-09 ENCOUNTER — Ambulatory Visit (INDEPENDENT_AMBULATORY_CARE_PROVIDER_SITE_OTHER): Payer: Medicare Other | Admitting: Sports Medicine

## 2022-03-09 VITALS — BP 120/80 | HR 80 | Ht 74.0 in | Wt 218.0 lb

## 2022-03-09 DIAGNOSIS — M25511 Pain in right shoulder: Secondary | ICD-10-CM

## 2022-03-09 DIAGNOSIS — G8929 Other chronic pain: Secondary | ICD-10-CM

## 2022-03-09 NOTE — Patient Instructions (Addendum)
Good to see you  Pt referral  As needed follow up

## 2022-03-10 ENCOUNTER — Ambulatory Visit: Payer: Medicare Other | Admitting: Sports Medicine
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
## 2022-03-13 ENCOUNTER — Ambulatory Visit: Payer: Medicare Other | Attending: Sports Medicine

## 2022-03-13 DIAGNOSIS — M5412 Radiculopathy, cervical region: Secondary | ICD-10-CM | POA: Insufficient documentation

## 2022-03-13 DIAGNOSIS — G8929 Other chronic pain: Secondary | ICD-10-CM | POA: Diagnosis not present

## 2022-03-13 DIAGNOSIS — M25511 Pain in right shoulder: Secondary | ICD-10-CM | POA: Insufficient documentation

## 2022-03-13 DIAGNOSIS — M25611 Stiffness of right shoulder, not elsewhere classified: Secondary | ICD-10-CM | POA: Insufficient documentation

## 2022-03-19 ENCOUNTER — Ambulatory Visit: Payer: Medicare Other | Attending: Sports Medicine | Admitting: Physical Therapy

## 2022-03-19 ENCOUNTER — Ambulatory Visit: Payer: Medicare Other | Admitting: Physical Therapy

## 2022-03-19 DIAGNOSIS — G8929 Other chronic pain: Secondary | ICD-10-CM | POA: Diagnosis not present

## 2022-03-19 DIAGNOSIS — M25511 Pain in right shoulder: Secondary | ICD-10-CM | POA: Insufficient documentation

## 2022-03-19 DIAGNOSIS — M5412 Radiculopathy, cervical region: Secondary | ICD-10-CM | POA: Insufficient documentation

## 2022-03-19 DIAGNOSIS — M25611 Stiffness of right shoulder, not elsewhere classified: Secondary | ICD-10-CM | POA: Diagnosis not present
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
## 2022-03-23 ENCOUNTER — Encounter: Payer: Self-pay | Admitting: Physical Therapy

## 2022-03-23 ENCOUNTER — Ambulatory Visit: Payer: Medicare Other | Admitting: Physical Therapy

## 2022-03-23 DIAGNOSIS — M5412 Radiculopathy, cervical region: Secondary | ICD-10-CM

## 2022-03-23 DIAGNOSIS — G8929 Other chronic pain: Secondary | ICD-10-CM | POA: Diagnosis not present

## 2022-03-23 DIAGNOSIS — N3941 Urge incontinence: Secondary | ICD-10-CM | POA: Diagnosis not present

## 2022-03-23 DIAGNOSIS — M25511 Pain in right shoulder: Secondary | ICD-10-CM | POA: Diagnosis not present

## 2022-03-23 DIAGNOSIS — N401 Enlarged prostate with lower urinary tract symptoms: Secondary | ICD-10-CM | POA: Diagnosis not present

## 2022-03-23 DIAGNOSIS — M25611 Stiffness of right shoulder, not elsewhere classified: Secondary | ICD-10-CM

## 2022-03-23 DIAGNOSIS — R350 Frequency of micturition: Secondary | ICD-10-CM | POA: Diagnosis not present
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
## 2022-03-30 ENCOUNTER — Ambulatory Visit: Payer: Medicare Other

## 2022-03-30 DIAGNOSIS — G8929 Other chronic pain: Secondary | ICD-10-CM

## 2022-03-30 DIAGNOSIS — M25611 Stiffness of right shoulder, not elsewhere classified: Secondary | ICD-10-CM

## 2022-03-30 DIAGNOSIS — M5412 Radiculopathy, cervical region: Secondary | ICD-10-CM | POA: Diagnosis not present

## 2022-03-30 DIAGNOSIS — M25511 Pain in right shoulder: Secondary | ICD-10-CM | POA: Diagnosis not present
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
## 2022-04-06 ENCOUNTER — Encounter: Payer: Self-pay | Admitting: Physical Therapy

## 2022-04-06 ENCOUNTER — Encounter: Payer: Self-pay | Admitting: Family Medicine

## 2022-04-06 ENCOUNTER — Ambulatory Visit (INDEPENDENT_AMBULATORY_CARE_PROVIDER_SITE_OTHER): Payer: Medicare Other | Admitting: Family Medicine

## 2022-04-06 ENCOUNTER — Ambulatory Visit: Payer: Medicare Other | Admitting: Physical Therapy

## 2022-04-06 VITALS — BP 115/68 | HR 78 | Temp 98.2°F | Ht 74.0 in | Wt 220.4 lb

## 2022-04-06 DIAGNOSIS — M5412 Radiculopathy, cervical region: Secondary | ICD-10-CM

## 2022-04-06 DIAGNOSIS — B349 Viral infection, unspecified: Secondary | ICD-10-CM | POA: Diagnosis not present

## 2022-04-06 DIAGNOSIS — G8929 Other chronic pain: Secondary | ICD-10-CM | POA: Diagnosis not present

## 2022-04-06 DIAGNOSIS — J4520 Mild intermittent asthma, uncomplicated: Secondary | ICD-10-CM | POA: Diagnosis not present

## 2022-04-06 DIAGNOSIS — R0981 Nasal congestion: Secondary | ICD-10-CM | POA: Diagnosis not present

## 2022-04-06 DIAGNOSIS — M25611 Stiffness of right shoulder, not elsewhere classified: Secondary | ICD-10-CM | POA: Diagnosis not present

## 2022-04-06 DIAGNOSIS — M25511 Pain in right shoulder: Secondary | ICD-10-CM | POA: Diagnosis not present

## 2022-04-06 MED ORDER — PREDNISONE 10 MG PO TABS: 10.0000 mg | ORAL_TABLET | Freq: Every day | ORAL | 0 refills | Status: AC

## 2022-04-06 NOTE — Progress Notes (Signed)
Established Patient Office Visit  Subjective   Patient ID: Adam Peters, male    DOB: [birth year]  Age: 71 y.o. MRN: [id number]  Chief Complaint  Patient presents with   Sinus Problem    Sinuses, congestion, cough, sneezing with lots of mucus. Symptoms x 4 days.     Sinus Problem Associated symptoms include congestion, coughing, headaches and a sore throat. Pertinent negatives include no chills or shortness of breath.   4-day history of fatigue, malaise, postnasal drip with sore throat, watery rhinorrhea cough cough with some wheeze.  Denies fever or chills.  There have been no myalgias or arthralgias.  History of asthma with adult onset.   5.    Review of Systems  Constitutional:  Positive for malaise/fatigue. Negative for chills and fever.  HENT:  Positive for congestion and sore throat.   Eyes:  Negative for blurred vision, discharge and redness.  Respiratory:  Positive for cough and wheezing. Negative for shortness of breath.   Cardiovascular: Negative.   Gastrointestinal:  Negative for abdominal pain.  Genitourinary: Negative.   Musculoskeletal: Negative.  Negative for joint pain and myalgias.  Skin:  Negative for rash.  Neurological:  Positive for headaches. Negative for tingling, loss of consciousness and weakness.  Endo/Heme/Allergies:  Negative for polydipsia.      Objective:     BP 115/68 (BP Location: Right Arm, Patient Position: Sitting, Cuff Size: Large)   Pulse 78   Temp 98.2 F (36.8 C) (Temporal)   Ht '6\' 2"'$  (1.88 m)   Wt 220 lb 6.4 oz (100 kg)   SpO2 96%   BMI 28.30 kg/m    Physical Exam Constitutional:      General: He is not in acute distress.    Appearance: Normal appearance. He is not ill-appearing, toxic-appearing or diaphoretic.  HENT:     Head: Normocephalic and atraumatic.     Right Ear: Tympanic membrane, ear canal and external ear normal.     Left Ear: Tympanic membrane, ear canal and external ear normal.     Mouth/Throat:      Mouth: Mucous membranes are moist.     Pharynx: Oropharynx is clear. No oropharyngeal exudate or posterior oropharyngeal erythema.  Eyes:     General: No scleral icterus.       Right eye: No discharge.        Left eye: No discharge.     Extraocular Movements: Extraocular movements intact.     Conjunctiva/sclera: Conjunctivae normal.     Pupils: Pupils are equal, round, and reactive to light.  Cardiovascular:     Rate and Rhythm: Normal rate and regular rhythm.  Pulmonary:     Effort: Pulmonary effort is normal. No respiratory distress.     Breath sounds: Normal breath sounds. No wheezing or rales.  Abdominal:     General: Bowel sounds are normal.     Tenderness: There is no abdominal tenderness. There is no guarding.  Musculoskeletal:     Cervical back: No rigidity or tenderness.  Skin:    General: Skin is warm and dry.  Neurological:     Mental Status: He is alert and oriented to person, place, and time.  Psychiatric:        Mood and Affect: Mood normal.        Behavior: Behavior normal.      No results found for any visits on 04/06/22.    The ASCVD Risk score (Arnett DK, et al., 2019) failed to calculate for the  following reasons:   The valid total cholesterol range is 130 to 320 mg/dL    Assessment & Plan:   Problem List Items Addressed This Visit   None Visit Diagnoses     Viral syndrome    -  Primary   Relevant Orders   COVID-19, Flu A+B and RSV   Mild intermittent reactive airway disease without complication       Relevant Medications   predniSONE (DELTASONE) 10 MG tablet       Return in about 1 week (around 04/13/2022), or if symptoms worsen or fail to improve. May use albuterol for cough and or wheeze or tightness.Libby Maw, MD

## 2022-04-06 NOTE — Therapy (Signed)
OUTPATIENT PHYSICAL THERAPY SHOULDER TREATMENT   Patient Name: Adam Peters MRN: [id number] DOB:12-10-1950, 71 y.o., male Today's Date: 04/06/2022   PT End of Session - 04/06/22 1300     Visit Number 5    Date for PT Re-Evaluation 05/15/22    PT Start Time 1300    PT Stop Time 1345    PT Time Calculation (min) 45 min    Activity Tolerance Patient tolerated treatment well    Behavior During Therapy [facility] for tasks assessed/performed              Past Medical History:  Diagnosis Date   Allergy    seasonal   Anemia    Anxiety    Asthma    CAD (coronary artery disease)    a. 1997 PCI/BMS to distal LAD (J&J PS BMS);  b. 03/2011 Neg MV;  c. 07/2013 NSTEMI/PCI: LM 20d, LAD 59m 50d isr, LCX nl, OM1/2 nl, RCA dom 95-962m3.5x16 Promus premier DES), EF 55%, mild basal inf HK.   Cataract    Coronary artery disease    s/p PCI to LAD (1997) - Dr. A.Rockne Menghini GERD (gastroesophageal reflux disease)    Hepatitis C    History of tobacco use    Hyperlipidemia    Hypertension    Schizophrenia (HCMoore1978   Past Surgical History:  Procedure Laterality Date   [place]   patent LAD stent (Dr RMGeorges Lynch  [place]/22/1997   prox LAD stenting 80% to 0% (Dr. A.Rockne Menghini- repeat cath in 11/1999 showed no significant coronary disease & normal systolic function (Dr. R.Gerrie Nordmann  CORONARY STENT PLACEMENT  08/02/2013   RCA   DES         [place]  [place] CORONARY ANGIOGRAM N/A 08/02/2013   Procedure: LEFT HEART CATHETERIZATION WITH CORONARY ANGIOGRAM;  Surgeon: MiBlane OharaMD;  [facility];  Service: Cardiovascular;  Laterality: N/A;   TRANSTHORACIC ECHOCARDIOGRAM  11/13/2008   EF 50-55%, normal LV systolic function; RA mildly dilated; trace MR/TR/PR   Patient Active Problem List   Diagnosis Date Noted   Peripheral  arterial disease (HCJoshua Tree10/17/2023   Intermittent claudication (HCWimbledon10/06/2021   Chronic right shoulder pain 02/16/2022   BPH with obstruction/lower urinary tract symptoms 09/08/2021   Moderate persistent asthma, uncomplicated 0210/28/9022 OSA on CPAP 03/10/2021   Asthma 10/03/2020   Seasonal and perennial allergic rhinitis 04/04/2019   Ex-smoker 04/04/2019   Mild persistent asthma, uncomplicated 1184/10/9859 Dyspnea on exertion 01/23/2016   Essential hypertension    Dyslipidemia    History of tobacco use 07/08/2012   Glaucoma 04/08/2012   DDD (degenerative disc disease), cervical    CAD- LAD BMS '97, RCA DES 08/02/13 08/17/2011    PCP: MiHorald PollenREFERRING PROVIDER: BeGlennon MacREFERRING DIAG: [phone] THERAPY DIAG:  Radiculopathy, cervical region  Chronic right shoulder pain  Stiffness of right shoulder, not elsewhere classified  Rationale for Evaluation and Treatment Rehabilitation  ONSET DATE: 03/09/22  SUBJECTIVE:  SUBJECTIVE STATEMENT: A little better, No numbness over the weekend   PERTINENT HISTORY: CAD, chronic R shoulder pain  PAIN:  Are you having pain? Yes: NPRS scale: 0/10 Pain location: R shoulder and radiates into my wrist Pain description: annoying, numbness Aggravating factors: weight lifting, driving Relieving factors: hot showers  PRECAUTIONS: None  WEIGHT BEARING RESTRICTIONS: No  FALLS:  Has patient fallen in last 6 months? No  LIVING ENVIRONMENT: Lives with: lives with their spouse Lives in: House/apartment Stairs: Yes: Internal: 30 steps; on right going up Has following equipment at home: None  OCCUPATION: Retired  PLOF: Independent  PATIENT GOALS:want to get the tingling sensation out my arm   OBJECTIVE:   DIAGNOSTIC  FINDINGS:    IMPRESSION: 1. Degenerative changes of the acromioclavicular and glenohumeral joints as above. No acute bony abnormality.    PATIENT SURVEYS:  FOTO 76  COGNITION: Overall cognitive status: Within functional limits for tasks assessed     SENSATION: WFL  POSTURE: Rounded shoulders, slight forward head  UPPER EXTREMITY ROM:   Active ROM Right eval Left eval  Shoulder flexion WFL   Shoulder extension [facility]   Shoulder abduction    Shoulder adduction    Shoulder internal rotation 55   Shoulder external rotation WFL   Elbow flexion    Elbow extension    Wrist flexion    Wrist extension    Wrist ulnar deviation    Wrist radial deviation    Wrist pronation    Wrist supination    (Blank rows = not tested)  UPPER EXTREMITY MMT: 5/5    SHOULDER SPECIAL TESTS: Impingement tests: Neer impingement test: positive , Hawkins/Kennedy impingement test: positive , and Painful arc test: positive    JOINT MOBILITY TESTING:  Normal, no increased tightness or muscle guarding     TODAY'S TREATMENT:                                                                                                                           DATE: 04/06/22 UBE L4 x6 mins Rows and Lats 35# 2x12 Chest press 25# 2x12  ER/IR Green 2x10 RUE abducted to 90 red ER 2x10 Shoulder Flex 4lb 2x10 Shoulder abd 3lb 2x10 PROM end range stretching  03/30/22 UBE L3 x41mns Shoulder ext 10# 2x10 Cable rows 15# 2x10 Rows and Lats 35# 2x10  Chest press 25# 2x10  Horizontal abd green 2x10 Diagonals greenTB 2x10 Pec stretch in doorway 30s  SA lifts against yellowTB 2x10 PROM end range stretching  03/23/22 UBE L3 x3 min each  Rows 35lb 2x10 Lats 35lb 2x10 Chin tucks yellow band 2x10 ER red 2x10 Shoulder flex 4lb 2x10 Shoulder abd 3lb 2x10 Supine RUE ER/IR 2lb x 10  PROM with focus on end range stretch and capsule stretch  03/19/22 performed HEP see below UBE L 3 2 min each way Neural  testing Scap stab on wall 3 pt 10 x each 5# UE stab ex with head on ball  maintain cervical retraction IR/ER supine 5# 2 sets 10 with PTA support to prevent shld compensation and get end range stretch PROM with focus on end range stretch and capsule stretch    03/13/22- Eval and POC   PATIENT EDUCATION: Education details: HEP and POC Person educated: Patient Education method: Explanation Education comprehension: verbalized understanding  HOME EXERCISE PROGRAM:  03/19/22 [id number]- scap stab and median nerve stretch   Pec stretch in doorway Upper trap stretch IR with towel   ASSESSMENT:  CLINICAL IMPRESSION:  Patient entrs doing well reporitng no numbness over the weekend. Session focused on scap/postural stab, no symptoms reported during session. End range shoulder tightness present with PROM into flexion and abd. Pt did have some RUE weakness with shoulder abducted doing external rotations.   REHAB POTENTIAL: Good  CLINICAL DECISION MAKING: Stable/uncomplicated  EVALUATION COMPLEXITY: Low   GOALS: Goals reviewed with patient? Yes  SHORT TERM GOALS: Target date: 04/10/22  Patient will be independent with initial HEP.  Goal status: met 03/19/22    LONG TERM GOALS: Target date: 05/15/22  Patient will be independent with advanced/ongoing HEP to improve outcomes and carryover.  Goal status: INITIAL  2.  Patient will be able to lift weights at the gym without pain  Goal status: INITIAL  3.  Patient will report no numbness in R UE with gym activities and driving  Goal status: INITIAL  4.  Patient to improve R shoulder internal rotation AROM to [facility] without pain provocation to allow for increased ease of ADLs.  Baseline: 55 Goal status: INITIAL PLAN:  PT FREQUENCY: 1x/week  PT DURATION: 8 weeks  PLANNED INTERVENTIONS: Therapeutic exercises, Therapeutic activity, Neuromuscular re-education, Balance training, Gait training, Patient/Family education, Self Care,  Joint mobilization, Dry Needling, Electrical stimulation, Cryotherapy, Moist heat, Traction, and Manual therapy  PLAN FOR NEXT SESSION: stretching and strengthening of RUE, maybe manual or cervical traction to help with numbness/tingling    Scot Jun, PTA 04/06/2022, 1:00 PM [address] [place], [place], [address] Phone: [phone]   Fax:  [phone]  Patient Details  Name: Adam Peters MRN: [id number] Date of Birth: [birth year] Referring Provider:  Horald Pollen, *  Encounter Date: 04/06/2022   Scot Jun, PTA 04/06/2022, 1:00 PM  [address] [place], [place], [address] Phone: [phone]   Fax:  [phone]

## 2022-04-07 ENCOUNTER — Ambulatory Visit: Payer: Medicare Other | Admitting: Emergency Medicine

## 2022-04-08 LAB — COVID-19, FLU A+B AND RSV
Influenza A, NAA: NOT DETECTED
Influenza B, NAA: NOT DETECTED
RSV, NAA: NOT DETECTED
SARS-CoV-2, NAA: DETECTED — AB

## 2022-04-08 NOTE — Therapy (Signed)
OUTPATIENT PHYSICAL THERAPY SHOULDER TREATMENT   Patient Name: Adam Peters MRN: [id number] DOB:01/29/1951, 71 y.o., male Today's Date: 04/13/2022   PT End of Session - 04/13/22 1315     Visit Number 6    Date for PT Re-Evaluation 05/15/22    PT Start Time 1315    PT Stop Time 1400    PT Time Calculation (min) 45 min    Activity Tolerance Patient tolerated treatment well    Behavior During Therapy [facility] for tasks assessed/performed               Past Medical History:  Diagnosis Date   Allergy    seasonal   Anemia    Anxiety    Asthma    CAD (coronary artery disease)    a. 1997 PCI/BMS to distal LAD (J&J PS BMS);  b. 03/2011 Neg MV;  c. 07/2013 NSTEMI/PCI: LM 20d, LAD 30m 50d isr, LCX nl, OM1/2 nl, RCA dom 95-960m3.5x16 Promus premier DES), EF 55%, mild basal inf HK.   Cataract    Coronary artery disease    s/p PCI to LAD (1997) - Dr. A.Rockne Menghini GERD (gastroesophageal reflux disease)    Hepatitis C    History of tobacco use    Hyperlipidemia    Hypertension    Schizophrenia (HCJefferson1978   Past Surgical History:  Procedure Laterality Date   [place]2001   patent LAD stent (Dr RMGeorges Lynch  [place]1997   prox LAD stenting 80% to 0% (Dr. A.Rockne Menghini- repeat cath in 11/1999 showed no significant coronary disease & normal systolic function (Dr. R.Gerrie Nordmann  CORONARY STENT PLACEMENT  08/02/2013   RCA   DES         [place]  [place] CORONARY ANGIOGRAM N/A 08/02/2013   Procedure: LEFT HEART CATHETERIZATION WITH CORONARY ANGIOGRAM;  Surgeon: MiBlane OharaMD;  [facility];  Service: Cardiovascular;  Laterality: N/A;   TRANSTHORACIC ECHOCARDIOGRAM  11/13/2008   EF 50-55%, normal LV systolic function; RA mildly dilated; trace MR/TR/PR   Patient Active Problem List   Diagnosis Date Noted   Peripheral  arterial disease (HCHowland Center10/17/2023   Intermittent claudication (HCStar Prairie10/06/2021   Chronic right shoulder pain 02/16/2022   BPH with obstruction/lower urinary tract symptoms 09/08/2021   Moderate persistent asthma, uncomplicated 0299/35/7017 OSA on CPAP 03/10/2021   Asthma 10/03/2020   Seasonal and perennial allergic rhinitis 04/04/2019   Ex-smoker 04/04/2019   Mild persistent asthma, uncomplicated 1179/39/0300 Dyspnea on exertion 01/23/2016   Essential hypertension    Dyslipidemia    History of tobacco use 07/08/2012   Glaucoma 04/08/2012   DDD (degenerative disc disease), cervical    CAD- LAD BMS '97, RCA DES 08/02/13 08/17/2011    PCP: MiHorald PollenREFERRING PROVIDER: BeGlennon MacREFERRING DIAG: [phone] THERAPY DIAG:  Radiculopathy, cervical region  Chronic right shoulder pain  Stiffness of right shoulder, not elsewhere classified  Rationale for Evaluation and Treatment Rehabilitation  ONSET DATE: 03/09/22  SUBJECTIVE:  SUBJECTIVE STATEMENT: shoulder is dong better, getting numbness just a little mostly when I'm driving.   PERTINENT HISTORY: CAD, chronic R shoulder pain  PAIN:  Are you having pain? Yes: NPRS scale: 0/10 Pain location: R shoulder and radiates into my wrist Pain description: annoying, numbness Aggravating factors: weight lifting, driving Relieving factors: hot showers  PRECAUTIONS: None  WEIGHT BEARING RESTRICTIONS: No  FALLS:  Has patient fallen in last 6 months? No  LIVING ENVIRONMENT: Lives with: lives with their spouse Lives in: House/apartment Stairs: Yes: Internal: 30 steps; on right going up Has following equipment at home: None  OCCUPATION: Retired  PLOF: Independent  PATIENT GOALS:want to get the tingling sensation out my arm    OBJECTIVE:   DIAGNOSTIC FINDINGS:    IMPRESSION: 1. Degenerative changes of the acromioclavicular and glenohumeral joints as above. No acute bony abnormality.    PATIENT SURVEYS:  FOTO 76  COGNITION: Overall cognitive status: Within functional limits for tasks assessed     SENSATION: WFL  POSTURE: Rounded shoulders, slight forward head  UPPER EXTREMITY ROM:   Active ROM Right eval Left eval  Shoulder flexion WFL   Shoulder extension [facility]   Shoulder abduction    Shoulder adduction    Shoulder internal rotation 55   Shoulder external rotation WFL   Elbow flexion    Elbow extension    Wrist flexion    Wrist extension    Wrist ulnar deviation    Wrist radial deviation    Wrist pronation    Wrist supination    (Blank rows = not tested)  UPPER EXTREMITY MMT: 5/5    SHOULDER SPECIAL TESTS: Impingement tests: Neer impingement test: positive , Hawkins/Kennedy impingement test: positive , and Painful arc test: positive    JOINT MOBILITY TESTING:  Normal, no increased tightness or muscle guarding     TODAY'S TREATMENT:                                                                                                                           DATE: 04/13/22 UBE with power bursts L4 x15mns  Shoulder ext 10# 2x10 Cable rows 15# 2x10 Bicep curls 55# 2x10 Tricep ext 75# 2x10 Rows and Lats 35# 2x10 Chest press 35# 2x10  ER/IR green 3 way shoulder greenTB   04/06/22 UBE L4 x6 mins Rows and Lats 35# 2x12 Chest press 25# 2x12  ER/IR Green 2x10 RUE abducted to 90 red ER 2x10 Shoulder Flex 4lb 2x10 Shoulder abd 3lb 2x10 PROM end range stretching  03/30/22 UBE L3 x672ms Shoulder ext 10# 2x10 Cable rows 15# 2x10 Rows and Lats 35# 2x10  Chest press 25# 2x10  Horizontal abd green 2x10 Diagonals greenTB 2x10 Pec stretch in doorway 30s  SA lifts against yellowTB 2x10 PROM end range stretching  03/23/22 UBE L3 x3 min each  Rows 35lb 2x10 Lats 35lb  2x10 Chin tucks yellow band 2x10 ER red 2x10 Shoulder flex 4lb 2x10 Shoulder abd 3lb 2x10 Supine RUE  ER/IR 2lb x 10  PROM with focus on end range stretch and capsule stretch  03/19/22 performed HEP see below UBE L 3 2 min each way Neural testing Scap stab on wall 3 pt 10 x each 5# UE stab ex with head on ball maintain cervical retraction IR/ER supine 5# 2 sets 10 with PTA support to prevent shld compensation and get end range stretch PROM with focus on end range stretch and capsule stretch    03/13/22- Eval and POC   PATIENT EDUCATION: Education details: HEP and POC Person educated: Patient Education method: Explanation Education comprehension: verbalized understanding  HOME EXERCISE PROGRAM:  03/19/22 [id number]- scap stab and median nerve stretch   Pec stretch in doorway Upper trap stretch IR with towel   ASSESSMENT:  CLINICAL IMPRESSION:  Patient entrs doing well reporitng just slight numbness mostly noticed when driving.  Session focused on scap/postural stab, no symptoms reported during session. Cues needed with cable rows.   REHAB POTENTIAL: Good  CLINICAL DECISION MAKING: Stable/uncomplicated  EVALUATION COMPLEXITY: Low   GOALS: Goals reviewed with patient? Yes  SHORT TERM GOALS: Target date: 04/10/22  Patient will be independent with initial HEP.  Goal status: met 03/19/22    LONG TERM GOALS: Target date: 05/15/22  Patient will be independent with advanced/ongoing HEP to improve outcomes and carryover.  Goal status: INITIAL  2.  Patient will be able to lift weights at the gym without pain  Goal status: INITIAL  3.  Patient will report no numbness in R UE with gym activities and driving  Goal status: INITIAL  4.  Patient to improve R shoulder internal rotation AROM to [facility] without pain provocation to allow for increased ease of ADLs.  Baseline: 55 Goal status: INITIAL PLAN:  PT FREQUENCY: 1x/week  PT DURATION: 8 weeks  PLANNED  INTERVENTIONS: Therapeutic exercises, Therapeutic activity, Neuromuscular re-education, Balance training, Gait training, Patient/Family education, Self Care, Joint mobilization, Dry Needling, Electrical stimulation, Cryotherapy, Moist heat, Traction, and Manual therapy  PLAN FOR NEXT SESSION: stretching and strengthening of RUE, maybe manual or cervical traction to help with numbness/tingling    Andris Baumann, PT 04/13/2022, 1:57 PM [address] [place], [place], [address] Phone: [phone]   Fax:  [phone]  Patient Details  Name: Adam Peters MRN: [id number] Date of Birth: 1950-06-25 Referring Provider:  Horald Pollen, *  Encounter Date: 04/13/2022   Andris Baumann, PT 04/13/2022, 1:57 PM  [address] [place], [place], [address] Phone: [phone]   Fax:  [phone]

## 2022-04-13 ENCOUNTER — Ambulatory Visit: Payer: Medicare Other | Admitting: Emergency Medicine

## 2022-04-13 ENCOUNTER — Ambulatory Visit (INDEPENDENT_AMBULATORY_CARE_PROVIDER_SITE_OTHER): Payer: Medicare Other | Admitting: Emergency Medicine

## 2022-04-13 ENCOUNTER — Ambulatory Visit: Payer: Medicare Other | Attending: Sports Medicine

## 2022-04-13 ENCOUNTER — Encounter: Payer: Self-pay | Admitting: Emergency Medicine

## 2022-04-13 VITALS — BP 108/62 | HR 80 | Temp 98.1°F | Ht 74.0 in | Wt 220.2 lb

## 2022-04-13 DIAGNOSIS — I739 Peripheral vascular disease, unspecified: Secondary | ICD-10-CM | POA: Diagnosis not present

## 2022-04-13 DIAGNOSIS — I10 Essential (primary) hypertension: Secondary | ICD-10-CM

## 2022-04-13 DIAGNOSIS — N401 Enlarged prostate with lower urinary tract symptoms: Secondary | ICD-10-CM | POA: Diagnosis not present

## 2022-04-13 DIAGNOSIS — G4733 Obstructive sleep apnea (adult) (pediatric): Secondary | ICD-10-CM | POA: Diagnosis not present

## 2022-04-13 DIAGNOSIS — I70213 Atherosclerosis of native arteries of extremities with intermittent claudication, bilateral legs: Secondary | ICD-10-CM

## 2022-04-13 DIAGNOSIS — I952 Hypotension due to drugs: Secondary | ICD-10-CM

## 2022-04-13 DIAGNOSIS — E785 Hyperlipidemia, unspecified: Secondary | ICD-10-CM | POA: Diagnosis not present

## 2022-04-13 DIAGNOSIS — N138 Other obstructive and reflux uropathy: Secondary | ICD-10-CM | POA: Diagnosis not present

## 2022-04-13 DIAGNOSIS — M25511 Pain in right shoulder: Secondary | ICD-10-CM | POA: Diagnosis not present

## 2022-04-13 DIAGNOSIS — J4540 Moderate persistent asthma, uncomplicated: Secondary | ICD-10-CM

## 2022-04-13 DIAGNOSIS — M25611 Stiffness of right shoulder, not elsewhere classified: Secondary | ICD-10-CM | POA: Diagnosis not present

## 2022-04-13 DIAGNOSIS — G8929 Other chronic pain: Secondary | ICD-10-CM | POA: Insufficient documentation

## 2022-04-13 DIAGNOSIS — M5412 Radiculopathy, cervical region: Secondary | ICD-10-CM | POA: Insufficient documentation

## 2022-04-13 MED ORDER — AMLODIPINE BESYLATE 5 MG PO TABS: 5.0000 mg | ORAL_TABLET | Freq: Every day | ORAL | 3 refills | Status: DC

## 2022-04-13 NOTE — Assessment & Plan Note (Signed)
Stable.  Advised to continue daily baby aspirin and atorvastatin 40 mg

## 2022-04-13 NOTE — Assessment & Plan Note (Signed)
Improving with Gemtesa 75 mg daily.

## 2022-04-13 NOTE — Assessment & Plan Note (Signed)
Stable.  Diet and nutrition discussed. Continue atorvastatin 40 mg daily. 

## 2022-04-13 NOTE — Assessment & Plan Note (Signed)
Stable.  On CPAP treatment. 

## 2022-04-13 NOTE — Assessment & Plan Note (Signed)
Stable.  Continue Breztri 2 puffs twice a day.

## 2022-04-13 NOTE — Assessment & Plan Note (Signed)
Currently low.  Continue Benicar 20 mg daily.  Decrease dose of amlodipine to 5 mg daily.

## 2022-04-13 NOTE — Patient Instructions (Signed)
Decrease amlodipine to 5 mg daily Continue rest of medications Continue monitoring blood pressure readings at home daily for the next several weeks and keep a log.  Contact the office if numbers persistently abnormal.  Hypotension As your heart beats, it forces blood through your body. This force is called blood pressure. If you have hypotension, you have low blood pressure.  When your blood pressure is too low, you may not get enough blood to your brain or other parts of your body. This may cause you to feel weak, light-headed, have a fast heartbeat, or even faint. Low blood pressure may be harmless, or it may cause serious problems. What are the causes? Blood loss. Not enough water in the body (dehydration). Heart problems. Hormone problems. Pregnancy. A very bad infection. Not having enough of certain nutrients. Very bad allergic reactions. Certain medicines. What increases the risk? Age. The risk increases as you get older. Conditions that affect the heart or the brain and spinal cord (central nervous system). What are the signs or symptoms? Feeling: Weak. Light-headed. Dizzy. Tired (fatigued). Blurred vision. Fast heartbeat. Fainting, in very bad cases. How is this treated? Changing your diet. This may involve drinking more water or including more salt (sodium) in your diet by eating high-salt foods. Taking medicines to raise your blood pressure. Changing how much you take (the dosage) of some of your medicines. Wearing compression stockings. These stockings help to prevent blood clots and reduce swelling in your legs. In some cases, you may need to go to the hospital to: Receive fluids through an IV tube. Receive donated blood through an IV tube (transfusion). Get treated for an infection or heart problems, if this applies. Be monitored while medicines that you are taking wear off. Follow these instructions at home: Eating and drinking  Drink enough fluids to keep your  pee (urine) pale yellow. Eat a healthy diet. Follow instructions from your doctor about what you can eat or drink. A healthy diet includes: Fresh fruits and vegetables. Whole grains. Low-fat (lean) meats. Low-fat dairy products. If told, include more salt in your diet. Do not add extra salt to your diet unless your doctor tells you to. Eat small meals often. Avoid standing up quickly after you eat. Medicines Take over-the-counter and prescription medicines only as told by your doctor. Follow instructions from your doctor about changing how much you take of your medicines, if this applies. Do not stop or change any of your medicines on your own. General instructions  Wear compression stockings as told by your doctor. Get up slowly from lying down or sitting. Avoid hot showers and a lot of heat as told by your doctor. Return to your normal activities when your doctor says that it is safe. Do not smoke or use any products that contain nicotine or tobacco. If you need help quitting, ask your doctor. Keep all follow-up visits. Contact a doctor if: You vomit. You have watery poop (diarrhea). You have a fever for more than 2-3 days. You feel more thirsty than normal. You feel weak and tired. Get help right away if: You have chest pain. You have a fast or uneven heartbeat. You lose feeling (have numbness) in any part of your body. You cannot move your arms or your legs. You have trouble talking. You get sweaty or feel light-headed. You faint. You have trouble breathing. You have trouble staying awake. You feel mixed up (confused). These symptoms may be an emergency. Get help right away. Call 911. Do  not wait to see if the symptoms will go away. Do not drive yourself to the hospital. Summary Hypotension is also called low blood pressure. It is when the force of blood pumping through your body is too weak. Hypotension may be harmless, or it may cause serious problems. Treatment may  include changing your diet and medicines, and wearing compression stockings. In very bad cases, you may need to go to the hospital. This information is not intended to replace advice given to you by your health care provider. Make sure you discuss any questions you have with your health care provider. Document Revised: 12/23/2020 Document Reviewed: 12/23/2020 Elsevier Patient Education  [place].
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
## 2022-04-13 NOTE — Assessment & Plan Note (Signed)
Symptomatic.  Recommend to decrease dose of amlodipine to 5 mg daily Advised to monitor blood pressure readings at home daily for the next several weeks and keep a log.  Advised to contact the office if numbers persistently abnormal.

## 2022-04-13 NOTE — Assessment & Plan Note (Signed)
Stable.  Continue aspirin and atorvastatin.

## 2022-04-14 ENCOUNTER — Telehealth: Payer: Self-pay | Admitting: Emergency Medicine

## 2022-04-14 ENCOUNTER — Other Ambulatory Visit: Payer: Self-pay | Admitting: *Deleted

## 2022-04-14 NOTE — Telephone Encounter (Signed)
Patient states he has been taking the Amlodipine 2.'5mg'$  one tab a day. Patient states that this medication was suppose to be decreased, please advise on the dosage of this medication.

## 2022-04-14 NOTE — Telephone Encounter (Signed)
According to my notes he was taking 10 mg daily and we reduced the dose to 5 mg daily.  Need better clarification please.  Thanks.

## 2022-04-14 NOTE — Telephone Encounter (Signed)
Called patient to inform him of provider recommendation. Patient is to stop amlodipine and continue to take Olmesartan. Patient to check his BP reading at home and inform provider.

## 2022-04-14 NOTE — Telephone Encounter (Signed)
Called patient to get clarification on the dosage he is taking of the Amlodipine

## 2022-04-14 NOTE — Telephone Encounter (Signed)
Pt is requesting a call back fromDon at [phone]

## 2022-04-14 NOTE — Telephone Encounter (Signed)
Patient is on amlodopine 2.5 already.  He does not need the 5.0 mg because he is already at 2.5 - has been on this for at least 6 months
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
## 2022-04-21 ENCOUNTER — Ambulatory Visit: Payer: Medicare Other | Attending: Sports Medicine

## 2022-04-21 DIAGNOSIS — M25511 Pain in right shoulder: Secondary | ICD-10-CM | POA: Diagnosis not present

## 2022-04-21 DIAGNOSIS — M25611 Stiffness of right shoulder, not elsewhere classified: Secondary | ICD-10-CM | POA: Diagnosis not present

## 2022-04-21 DIAGNOSIS — M5412 Radiculopathy, cervical region: Secondary | ICD-10-CM | POA: Diagnosis not present

## 2022-04-21 DIAGNOSIS — G8929 Other chronic pain: Secondary | ICD-10-CM | POA: Diagnosis not present

## 2022-04-28 ENCOUNTER — Ambulatory Visit: Payer: Medicare Other | Admitting: Physical Therapy

## 2022-04-28 ENCOUNTER — Encounter: Payer: Self-pay | Admitting: Physical Therapy

## 2022-04-28 DIAGNOSIS — G8929 Other chronic pain: Secondary | ICD-10-CM | POA: Diagnosis not present

## 2022-04-28 DIAGNOSIS — M25611 Stiffness of right shoulder, not elsewhere classified: Secondary | ICD-10-CM

## 2022-04-28 DIAGNOSIS — M5412 Radiculopathy, cervical region: Secondary | ICD-10-CM

## 2022-04-28 DIAGNOSIS — M25511 Pain in right shoulder: Secondary | ICD-10-CM | POA: Diagnosis not present
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
## 2022-05-04 ENCOUNTER — Ambulatory Visit: Payer: Medicare Other | Admitting: Physical Therapy

## 2022-05-04 ENCOUNTER — Encounter: Payer: Self-pay | Admitting: Physical Therapy

## 2022-05-04 DIAGNOSIS — N3941 Urge incontinence: Secondary | ICD-10-CM | POA: Diagnosis not present

## 2022-05-04 DIAGNOSIS — M25611 Stiffness of right shoulder, not elsewhere classified: Secondary | ICD-10-CM

## 2022-05-04 DIAGNOSIS — G8929 Other chronic pain: Secondary | ICD-10-CM

## 2022-05-04 DIAGNOSIS — M25511 Pain in right shoulder: Secondary | ICD-10-CM | POA: Diagnosis not present

## 2022-05-04 DIAGNOSIS — N401 Enlarged prostate with lower urinary tract symptoms: Secondary | ICD-10-CM | POA: Diagnosis not present

## 2022-05-04 DIAGNOSIS — M5412 Radiculopathy, cervical region: Secondary | ICD-10-CM | POA: Diagnosis not present
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
## 2022-05-06 ENCOUNTER — Other Ambulatory Visit: Payer: Self-pay | Admitting: Allergy & Immunology

## 2022-05-13 ENCOUNTER — Ambulatory Visit: Payer: Medicare Other | Admitting: Emergency Medicine

## 2022-05-14 ENCOUNTER — Ambulatory Visit: Payer: Medicare Other

## 2022-05-20 ENCOUNTER — Encounter: Payer: Self-pay | Admitting: Emergency Medicine

## 2022-05-20 ENCOUNTER — Ambulatory Visit (INDEPENDENT_AMBULATORY_CARE_PROVIDER_SITE_OTHER): Payer: Medicare Other | Admitting: Emergency Medicine

## 2022-05-20 VITALS — BP 126/74 | HR 74 | Temp 98.1°F | Ht 74.0 in | Wt 221.2 lb

## 2022-05-20 DIAGNOSIS — I739 Peripheral vascular disease, unspecified: Secondary | ICD-10-CM | POA: Diagnosis not present

## 2022-05-20 DIAGNOSIS — E785 Hyperlipidemia, unspecified: Secondary | ICD-10-CM

## 2022-05-20 DIAGNOSIS — I10 Essential (primary) hypertension: Secondary | ICD-10-CM | POA: Diagnosis not present

## 2022-05-20 DIAGNOSIS — R4189 Other symptoms and signs involving cognitive functions and awareness: Secondary | ICD-10-CM

## 2022-05-20 DIAGNOSIS — N138 Other obstructive and reflux uropathy: Secondary | ICD-10-CM | POA: Diagnosis not present

## 2022-05-20 DIAGNOSIS — N401 Enlarged prostate with lower urinary tract symptoms: Secondary | ICD-10-CM | POA: Diagnosis not present

## 2022-05-20 DIAGNOSIS — G4733 Obstructive sleep apnea (adult) (pediatric): Secondary | ICD-10-CM | POA: Diagnosis not present

## 2022-05-20 DIAGNOSIS — J4540 Moderate persistent asthma, uncomplicated: Secondary | ICD-10-CM

## 2022-05-20 NOTE — Assessment & Plan Note (Signed)
Stable.  Continues Breztri twice a day

## 2022-05-20 NOTE — Patient Instructions (Signed)
Health Maintenance After Age 73 After age 73, you are at a higher risk for certain long-term diseases and infections as well as injuries from falls. Falls are a major cause of broken bones and head injuries in people who are older than age 73. Getting regular preventive care can help to keep you healthy and well. Preventive care includes getting regular testing and making lifestyle changes as recommended by your health care provider. Talk with your health care provider about: Which screenings and tests you should have. A screening is a test that checks for a disease when you have no symptoms. A diet and exercise plan that is right for you. What should I know about screenings and tests to prevent falls? Screening and testing are the best ways to find a health problem early. Early diagnosis and treatment give you the best chance of managing medical conditions that are common after age 73. Certain conditions and lifestyle choices may make you more likely to have a fall. Your health care provider may recommend: Regular vision checks. Poor vision and conditions such as cataracts can make you more likely to have a fall. If you wear glasses, make sure to get your prescription updated if your vision changes. Medicine review. Work with your health care provider to regularly review all of the medicines you are taking, including over-the-counter medicines. Ask your health care provider about any side effects that may make you more likely to have a fall. Tell your health care provider if any medicines that you take make you feel dizzy or sleepy. Strength and balance checks. Your health care provider may recommend certain tests to check your strength and balance while standing, walking, or changing positions. Foot health exam. Foot pain and numbness, as well as not wearing proper footwear, can make you more likely to have a fall. Screenings, including: Osteoporosis screening. Osteoporosis is a condition that causes  the bones to get weaker and break more easily. Blood pressure screening. Blood pressure changes and medicines to control blood pressure can make you feel dizzy. Depression screening. You may be more likely to have a fall if you have a fear of falling, feel depressed, or feel unable to do activities that you used to do. Alcohol use screening. Using too much alcohol can affect your balance and may make you more likely to have a fall. Follow these instructions at home: Lifestyle Do not drink alcohol if: Your health care provider tells you not to drink. If you drink alcohol: Limit how much you have to: 0-1 drink a day for women. 0-2 drinks a day for men. Know how much alcohol is in your drink. In the [place] one drink equals one 12 oz bottle of beer (355 mL), one 5 oz glass of wine (148 mL), or one 1 oz glass of hard liquor (44 mL). Do not use any products that contain nicotine or tobacco. These products include cigarettes, chewing tobacco, and vaping devices, such as e-cigarettes. If you need help quitting, ask your health care provider. Activity  Follow a regular exercise program to stay fit. This will help you maintain your balance. Ask your health care provider what types of exercise are appropriate for you. If you need a cane or walker, use it as recommended by your health care provider. Wear supportive shoes that have nonskid soles. Safety  Remove any tripping hazards, such as rugs, cords, and clutter. Install safety equipment such as grab bars in bathrooms and safety rails on stairs. Keep rooms and walkways   well-lit. General instructions Talk with your health care provider about your risks for falling. Tell your health care provider if: You fall. Be sure to tell your health care provider about all falls, even ones that seem minor. You feel dizzy, tiredness (fatigue), or off-balance. Take over-the-counter and prescription medicines only as told by your health care provider. These include  supplements. Eat a healthy diet and maintain a healthy weight. A healthy diet includes low-fat dairy products, low-fat (lean) meats, and fiber from whole grains, beans, and lots of fruits and vegetables. Stay current with your vaccines. Schedule regular health, dental, and eye exams. Summary Having a healthy lifestyle and getting preventive care can help to protect your health and wellness after age 73. Screening and testing are the best way to find a health problem early and help you avoid having a fall. Early diagnosis and treatment give you the best chance for managing medical conditions that are more common for people who are older than age 73. Falls are a major cause of broken bones and head injuries in people who are older than age 73. Take precautions to prevent a fall at home. Work with your health care provider to learn what changes you can make to improve your health and wellness and to prevent falls. This information is not intended to replace advice given to you by your health care provider. Make sure you discuss any questions you have with your health care provider. Document Revised: 09/23/2020 Document Reviewed: 09/23/2020 Elsevier Patient Education  2023 [employer]  

## 2022-05-20 NOTE — Assessment & Plan Note (Signed)
Much improved after stenting.

## 2022-05-20 NOTE — Assessment & Plan Note (Signed)
Recently stented.  Doing well. Scheduled for right-sided stent next month Continue low-dose Xarelto 2.5 mg twice a day Was advised to continue Plavix 75 mg and daily baby aspirin by vascular surgeons

## 2022-05-20 NOTE — Assessment & Plan Note (Signed)
Persistent and slowly getting worse as per wife. Needs neuropsychiatric evaluation. Referral placed today.
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
## 2022-05-20 NOTE — Assessment & Plan Note (Signed)
Stable.  Continues Rapaflo 8 mg daily

## 2022-05-20 NOTE — Assessment & Plan Note (Signed)
Stable.  Diet and nutrition discussed. Continue atorvastatin 40 mg daily. 

## 2022-05-20 NOTE — Assessment & Plan Note (Signed)
Stable

## 2022-05-20 NOTE — Assessment & Plan Note (Signed)
Well-controlled hypertension. Continue olmesartan 20 mg daily BP Readings from Last 3 Encounters:  05/20/22 126/74  04/13/22 108/62  04/06/22 115/68

## 2022-05-22 ENCOUNTER — Telehealth: Payer: Self-pay

## 2022-05-22 ENCOUNTER — Encounter: Payer: Self-pay | Admitting: Psychology

## 2022-05-22 NOTE — Telephone Encounter (Signed)
Pt called requesting a copy of his records for his 03/04/22 appt.  Reviewed pt's chart, returned call for clarification, two identifiers used. Instructed pt to come to office during business hours and sign a release. He would get his records at that time. Confirmed understanding.

## 2022-05-25 ENCOUNTER — Other Ambulatory Visit: Payer: Self-pay | Admitting: Allergy & Immunology
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
## 2022-06-12 ENCOUNTER — Ambulatory Visit: Payer: Medicare Other | Attending: Sports Medicine

## 2022-06-19 ENCOUNTER — Other Ambulatory Visit: Payer: Self-pay | Admitting: Internal Medicine

## 2022-07-02 ENCOUNTER — Encounter: Payer: Self-pay | Admitting: Allergy & Immunology

## 2022-07-02 ENCOUNTER — Ambulatory Visit (INDEPENDENT_AMBULATORY_CARE_PROVIDER_SITE_OTHER): Payer: Medicare Other | Admitting: Family Medicine

## 2022-07-02 ENCOUNTER — Other Ambulatory Visit: Payer: Self-pay

## 2022-07-02 VITALS — BP 124/68 | HR 85 | Temp 97.7°F | Resp 18 | Ht 74.0 in | Wt 224.4 lb

## 2022-07-02 DIAGNOSIS — K219 Gastro-esophageal reflux disease without esophagitis: Secondary | ICD-10-CM

## 2022-07-02 DIAGNOSIS — J4540 Moderate persistent asthma, uncomplicated: Secondary | ICD-10-CM

## 2022-07-02 DIAGNOSIS — J3089 Other allergic rhinitis: Secondary | ICD-10-CM

## 2022-07-02 DIAGNOSIS — J302 Other seasonal allergic rhinitis: Secondary | ICD-10-CM

## 2022-07-02 NOTE — Progress Notes (Signed)
[place] [address] Dept: [phone]  FOLLOW UP NOTE  Patient ID: Adam Peters, male    DOB: April 12, 1951  Age: 72 y.o. MRN: [id number] Date of Office Visit: 07/02/2022  Assessment  Chief Complaint: Asthma, Allergic Rhinitis , and Follow-up  HPI Adam Peters is a 72 year old male who presents to the clinic for follow-up visit.  He was last seen in this clinic on 07/03/2021 by Dr. Ernst Bowler for evaluation of asthma and allergic rhinitis.  At today's visit, he reports his asthma has been moderately well-controlled with the main symptom being shortness of breath with moderate to vigorous activity.  He denies cough or wheeze with activity or rest.  He continues Breztri 2 puffs twice a day and has not used his albuterol inhaler since his last visit to this clinic.  At today's visit he does report that he continues to have a raspy sound to his voice.  He reports that sometime before he needed to begin inhalers or reflux control he had a baritone voice and now he reports that he has a scratchy tenor voice.  He is interested in trying to regain control of his throat symptoms and previous voice quality.  Allergic rhinitis is reported as moderately well-controlled with symptoms including sneezing and postnasal drainage occurring year-round.  He continues Allegra 180 mg once a day, Flonase daily, azelastine twice a day, and is not currently using nasal saline rinses.  His last environmental allergy skin testing was on 02/22/2019 and was positive to grass pollen, ragweed pollen, tree pollen, cat, dog, and mold.  Reflux is reported as moderately well-controlled with no symptoms including heartburn or vomiting.  He continues omeprazole 40 mg twice a day.  He does report that he takes 11 medications a day and is difficult for him to separate these out throughout the day.  He is not currently taking omeprazole 30 minutes before a meal.  His current medications are listed in the  chart.   Drug Allergies:  Allergies  Allergen Reactions   Amoxicillin Diarrhea    GI upset.    Physical Exam: BP 124/68   Pulse 85   Temp 97.7 F (36.5 C) (Temporal)   Resp 18   Ht 6' 2"$  (1.88 m)   Wt 224 lb 6.4 oz (101.8 kg)   SpO2 96%   BMI 28.81 kg/m    Physical Exam Vitals reviewed.  Constitutional:      Appearance: Normal appearance.  HENT:     Head: Normocephalic and atraumatic.     Right Ear: Tympanic membrane normal.     Left Ear: Tympanic membrane normal.     Nose:     Comments: Bilateral naris slightly edematous and pale with clear nasal drainage noted.  Pharynx slightly erythematous with no exudate.  Ears normal.  Eyes normal. Eyes:     Conjunctiva/sclera: Conjunctivae normal.  Cardiovascular:     Rate and Rhythm: Normal rate and regular rhythm.     Heart sounds: Normal heart sounds. No murmur heard. Pulmonary:     Effort: Pulmonary effort is normal.     Breath sounds: Normal breath sounds.     Comments: Lungs clear to auscultation Musculoskeletal:        General: Normal range of motion.     Cervical back: Normal range of motion and neck supple.  Skin:    General: Skin is warm and dry.  Neurological:     Mental Status: He is alert and oriented to person, place,  and time.  Psychiatric:        Mood and Affect: Mood normal.        Behavior: Behavior normal.        Thought Content: Thought content normal.        Judgment: Judgment normal.    Diagnostics: FVC 3.52 which is 87% of predicted value.  FEV1 2.73 which is 90% of predicted value.  Spirometry indicates normal ventilatory function.  Assessment and Plan: 1. Moderate persistent asthma, uncomplicated   2. Seasonal and perennial allergic rhinitis   3. Gastroesophageal reflux disease, unspecified whether esophagitis present     Meds ordered this encounter  Medications   albuterol (VENTOLIN HFA) 108 (90 Base) MCG/ACT inhaler    Sig: Inhale 2 puffs into the lungs every 4 (four) hours as needed  for wheezing or shortness of breath.    Dispense:  18 g    Refill:  1   fexofenadine (ALLEGRA) 180 MG tablet    Sig: Take 1 tablet (180 mg total) by mouth daily.    Dispense:  30 tablet    Refill:  2   fluticasone (FLONASE) 50 MCG/ACT nasal spray    Sig: Place 2 sprays into both nostrils daily.    Dispense:  16 g    Refill:  3   azelastine (ASTELIN) 0.1 % nasal spray    Sig: 2 sprays per nostril 1-2 times daily.    Dispense:  30 mL    Refill:  3    Patient Instructions  1. Moderate persistent asthma, uncomplicated - Lung testing looks great today - Daily controller medication(s):  Stilolto two puffs once daily with spacer - Prior to physical activity: albuterol 2 puffs 10-15 minutes before physical activity. - Rescue medications: albuterol 4 puffs every 4-6 hours as needed - Asthma control goals:  * Full participation in all desired activities (may need albuterol before activity) * Albuterol use two time or less a week on average (not counting use with activity) * Cough interfering with sleep two time or less a month * Oral steroids no more than once a year * No hospitalizations  2. Seasonal and perennial allergic rhinitis (grasses, ragweed, trees, indoor molds, outdoor molds, cat and dog) - Continue with: Allegra (fexofenadine) 165m tablet once daily and Flonase (fluticasone) two sprays per nostril daily and Astelin (azelastine) 2 sprays per nostril 1-2 times daily. - Continue with salt water rinses before the nose sprays. - Allergy shots might be the best treatment option in the long run since that can decrease mucous production, but we will keep this in our back pocket.   3. Reflux Continue dietary and lifestyle modifications as listed below Continue pantoprazole as previously prescribed. Take this medication at least 30 minutes before a meal for best result  Call the clinic if this treatment plan is not working well for you  Follow up in 3 months or sooner if  needed.   Return in about 3 months (around 09/30/2022), or if symptoms worsen or fail to improve.    Thank you for the opportunity to care for this patient.  Please do not hesitate to contact me with questions.  AGareth Morgan FNP Allergy and AHamblenof [place]

## 2022-07-02 NOTE — Patient Instructions (Addendum)
1. Moderate persistent asthma, uncomplicated - Lung testing looks great today - Daily controller medication(s):  Stilolto two puffs once daily with spacer - Prior to physical activity: albuterol 2 puffs 10-15 minutes before physical activity. - Rescue medications: albuterol 4 puffs every 4-6 hours as needed - Asthma control goals:  * Full participation in all desired activities (may need albuterol before activity) * Albuterol use two time or less a week on average (not counting use with activity) * Cough interfering with sleep two time or less a month * Oral steroids no more than once a year * No hospitalizations  2. Seasonal and perennial allergic rhinitis (grasses, ragweed, trees, indoor molds, outdoor molds, cat and dog) - Continue with: Allegra (fexofenadine) 169m tablet once daily and Flonase (fluticasone) two sprays per nostril daily and Astelin (azelastine) 2 sprays per nostril 1-2 times daily. - Continue with salt water rinses before the nose sprays. - Allergy shots might be the best treatment option in the long run since that can decrease mucous production, but we will keep this in our back pocket.   3. Reflux Continue dietary and lifestyle modifications as listed below Continue pantoprazole as previously prescribed. Take this medication at least 30 minutes before a meal for best result  Call the clinic if this treatment plan is not working well for you  Follow up in 3 months or sooner if needed.   Please inform u[place]oreaof any Emergency Department visits, hospitalizations, or changes in symptoms. Call uKoreabefore going to the ED for breathing or allergy symptoms since we might be able to fit you in for a sick visit. Feel free to contact uKoreaanytime with any questions, problems, or concerns.  It was a pleasure to meet you today!  Websites that have reliable patient information: 1. American Academy of Asthma, Allergy, and Immunology: www.aaaai.org 2. Food Allergy Research and Education  (FARE): foodallergy.org 3. Mothers of Asthmatics: http://www.asthmacommunitynetwork.org 4. American College of Allergy, Asthma, and Immunology: www.acaai.org   COVID-19 Vaccine Information can be found at: [email or web address] questions related to vaccine distribution or appointments, please email [email or web address] or call [phone]     "Like" u[place]on Facebook and Instagram for our latest updates!       Make sure you are registered to vote! If you have moved or changed any of your contact information, you will need to get this updated before voting!  In some cases, you MAY be able to register to vote online: [email or web address]

## 2022-07-03 ENCOUNTER — Encounter: Payer: Self-pay | Admitting: Family Medicine

## 2022-07-03 DIAGNOSIS — K219 Gastro-esophageal reflux disease without esophagitis: Secondary | ICD-10-CM | POA: Insufficient documentation

## 2022-07-03 MED ORDER — FEXOFENADINE HCL 180 MG PO TABS: 180.0000 mg | ORAL_TABLET | Freq: Every day | ORAL | 2 refills | Status: DC

## 2022-07-03 MED ORDER — FLUTICASONE PROPIONATE 50 MCG/ACT NA SUSP: 2.0000 | Freq: Every day | NASAL | 3 refills | Status: DC

## 2022-07-03 MED ORDER — AZELASTINE HCL 0.1 % NA SOLN: NASAL | 3 refills | Status: DC

## 2022-07-03 MED ORDER — ALBUTEROL SULFATE HFA 108 (90 BASE) MCG/ACT IN AERS: 2.0000 | INHALATION_SPRAY | RESPIRATORY_TRACT | 1 refills | Status: DC | PRN

## 2022-07-16 HISTORY — PX: CORONARY STENT PLACEMENT: SHX1402

## 2022-07-22 ENCOUNTER — Other Ambulatory Visit: Payer: Self-pay | Admitting: Internal Medicine

## 2022-07-23 ENCOUNTER — Other Ambulatory Visit: Payer: Self-pay | Admitting: *Deleted

## 2022-07-23 MED ORDER — FEXOFENADINE HCL 180 MG PO TABS: 180.0000 mg | ORAL_TABLET | Freq: Every day | ORAL | 3 refills | Status: DC

## 2022-07-29 ENCOUNTER — Other Ambulatory Visit: Payer: Self-pay | Admitting: Allergy & Immunology

## 2022-07-29 NOTE — Telephone Encounter (Signed)
Deny please. Thank you. Ordered stiolto at the last visit. Thank you

## 2022-07-29 NOTE — Telephone Encounter (Signed)
Please advise to breztri refill

## 2022-08-03 ENCOUNTER — Telehealth: Payer: Self-pay | Admitting: Family Medicine

## 2022-08-03 MED ORDER — STIOLTO RESPIMAT 2.5-2.5 MCG/ACT IN AERS: 2.0000 | INHALATION_SPRAY | Freq: Every day | RESPIRATORY_TRACT | 5 refills | Status: DC

## 2022-08-03 MED ORDER — AZELASTINE HCL 0.1 % NA SOLN: NASAL | 3 refills | Status: DC

## 2022-08-03 MED ORDER — STIOLTO RESPIMAT 2.5-2.5 MCG/ACT IN AERS: 2.0000 | INHALATION_SPRAY | Freq: Every day | RESPIRATORY_TRACT | 3 refills | Status: DC

## 2022-08-03 NOTE — Addendum Note (Signed)
Addended by: Tommas Olp B on: 08/03/2022 05:33 PM   Modules accepted: Orders

## 2022-08-03 NOTE — Telephone Encounter (Signed)
Patient called back and stated he needs both medications Stiolto and Azelastine sent to [facility], [place] AT [facility] [address] (Ph: [phone].

## 2022-08-03 NOTE — Telephone Encounter (Signed)
Patient called and stated he needs a refill on Stiolto and azelastine azelastine (ASTELIN) 0.1 % nasal spray  to call and wanted it called into [facility], [address] AT [address] (Ph: [phone]

## 2022-08-03 NOTE — Telephone Encounter (Signed)
Patient called back in  - DOB/Pharmacy verified - requested Azelastine (Astelin) 0.1% Nasal Spray refill prescription be sent in to [facility] as well.  Medication Refill sent.  Patient verbalized understanding, no questions.

## 2022-08-03 NOTE — Telephone Encounter (Addendum)
Patient called in - DOB verified - stated [facility] advised him they are out of Stiolto request alternative and prescription be sent in to [facility].  Called [facility], Pharmacy Technician - DOB verified - prescription sent in for [employer]. Prescription also sent to [facility].  Per Webb Silversmith - Patient can use AStepro until 08/21/22 - refill on Asterlin will be effective - same directions.  Patient stated he will come buy office - [address] - to pick up samples - tomorrow, Tuesday, 08/04/22.  Called patient - spoke to spouse - DOB verified/ NEED updated DPR - advised of all above notation.  Spouse verbalized understanding to all, no further questions.

## 2022-08-10 ENCOUNTER — Other Ambulatory Visit: Payer: Self-pay | Admitting: Internal Medicine

## 2022-08-13 ENCOUNTER — Telehealth: Payer: Self-pay

## 2022-08-13 MED ORDER — AZELASTINE HCL 0.1 % NA SOLN: NASAL | 1 refills | Status: DC

## 2022-08-13 MED ORDER — STIOLTO RESPIMAT 2.5-2.5 MCG/ACT IN AERS: 2.0000 | INHALATION_SPRAY | Freq: Every day | RESPIRATORY_TRACT | 3 refills | Status: DC

## 2022-08-13 NOTE — Telephone Encounter (Signed)
Patient called in - DOB/Pharmacy verified - requested 90 day supply for: Azelastine (Astelin) and Stiolto.  Prescriptions  were sent to [facility].

## 2022-08-24 ENCOUNTER — Encounter: Payer: Self-pay | Admitting: Psychology

## 2022-08-24 ENCOUNTER — Encounter: Payer: Medicare Other | Attending: Psychology | Admitting: Psychology

## 2022-08-24 DIAGNOSIS — R413 Other amnesia: Secondary | ICD-10-CM | POA: Diagnosis not present

## 2022-08-24 DIAGNOSIS — I739 Peripheral vascular disease, unspecified: Secondary | ICD-10-CM | POA: Insufficient documentation

## 2022-08-24 DIAGNOSIS — I10 Essential (primary) hypertension: Secondary | ICD-10-CM | POA: Diagnosis not present

## 2022-08-24 DIAGNOSIS — G4733 Obstructive sleep apnea (adult) (pediatric): Secondary | ICD-10-CM | POA: Diagnosis not present
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
## 2022-08-26 ENCOUNTER — Ambulatory Visit: Payer: Medicare Other | Admitting: Internal Medicine

## 2022-09-01 ENCOUNTER — Encounter: Payer: Self-pay | Admitting: Emergency Medicine

## 2022-09-01 ENCOUNTER — Ambulatory Visit (INDEPENDENT_AMBULATORY_CARE_PROVIDER_SITE_OTHER): Payer: Medicare Other | Admitting: Emergency Medicine

## 2022-09-01 VITALS — BP 130/72 | HR 71 | Temp 98.4°F | Ht 74.0 in | Wt 226.2 lb

## 2022-09-01 DIAGNOSIS — E785 Hyperlipidemia, unspecified: Secondary | ICD-10-CM | POA: Diagnosis not present

## 2022-09-01 DIAGNOSIS — I10 Essential (primary) hypertension: Secondary | ICD-10-CM

## 2022-09-01 DIAGNOSIS — I739 Peripheral vascular disease, unspecified: Secondary | ICD-10-CM

## 2022-09-01 DIAGNOSIS — X509XXA Other and unspecified overexertion or strenuous movements or postures, initial encounter: Secondary | ICD-10-CM | POA: Insufficient documentation

## 2022-09-01 NOTE — Assessment & Plan Note (Signed)
Chronic stable condition Continue atorvastatin 40 mg daily

## 2022-09-01 NOTE — Assessment & Plan Note (Signed)
Clinically stable.  No red flag signs or symptoms Had a near fall. Advised to decrease amount of physical activity and avoid point of exhaustion. Fall precautions given. Advised to stay well-hydrated and get plenty of rest Advised to maintain balance between resting and physical activity

## 2022-09-01 NOTE — Patient Instructions (Signed)
Health Maintenance After Age 73 After age 73, you are at a higher risk for certain long-term diseases and infections as well as injuries from falls. Falls are a major cause of broken bones and head injuries in people who are older than age 73. Getting regular preventive care can help to keep you healthy and well. Preventive care includes getting regular testing and making lifestyle changes as recommended by your health care provider. Talk with your health care provider about: Which screenings and tests you should have. A screening is a test that checks for a disease when you have no symptoms. A diet and exercise plan that is right for you. What should I know about screenings and tests to prevent falls? Screening and testing are the best ways to find a health problem early. Early diagnosis and treatment give you the best chance of managing medical conditions that are common after age 73. Certain conditions and lifestyle choices may make you more likely to have a fall. Your health care provider may recommend: Regular vision checks. Poor vision and conditions such as cataracts can make you more likely to have a fall. If you wear glasses, make sure to get your prescription updated if your vision changes. Medicine review. Work with your health care provider to regularly review all of the medicines you are taking, including over-the-counter medicines. Ask your health care provider about any side effects that may make you more likely to have a fall. Tell your health care provider if any medicines that you take make you feel dizzy or sleepy. Strength and balance checks. Your health care provider may recommend certain tests to check your strength and balance while standing, walking, or changing positions. Foot health exam. Foot pain and numbness, as well as not wearing proper footwear, can make you more likely to have a fall. Screenings, including: Osteoporosis screening. Osteoporosis is a condition that causes  the bones to get weaker and break more easily. Blood pressure screening. Blood pressure changes and medicines to control blood pressure can make you feel dizzy. Depression screening. You may be more likely to have a fall if you have a fear of falling, feel depressed, or feel unable to do activities that you used to do. Alcohol use screening. Using too much alcohol can affect your balance and may make you more likely to have a fall. Follow these instructions at home: Lifestyle Do not drink alcohol if: Your health care provider tells you not to drink. If you drink alcohol: Limit how much you have to: 0-1 drink a day for women. 0-2 drinks a day for men. Know how much alcohol is in your drink. In the [place] one drink equals one 12 oz bottle of beer (355 mL), one 5 oz glass of wine (148 mL), or one 1 oz glass of hard liquor (44 mL). Do not use any products that contain nicotine or tobacco. These products include cigarettes, chewing tobacco, and vaping devices, such as e-cigarettes. If you need help quitting, ask your health care provider. Activity  Follow a regular exercise program to stay fit. This will help you maintain your balance. Ask your health care provider what types of exercise are appropriate for you. If you need a cane or walker, use it as recommended by your health care provider. Wear supportive shoes that have nonskid soles. Safety  Remove any tripping hazards, such as rugs, cords, and clutter. Install safety equipment such as grab bars in bathrooms and safety rails on stairs. Keep rooms and walkways   well-lit. General instructions Talk with your health care provider about your risks for falling. Tell your health care provider if: You fall. Be sure to tell your health care provider about all falls, even ones that seem minor. You feel dizzy, tiredness (fatigue), or off-balance. Take over-the-counter and prescription medicines only as told by your health care provider. These include  supplements. Eat a healthy diet and maintain a healthy weight. A healthy diet includes low-fat dairy products, low-fat (lean) meats, and fiber from whole grains, beans, and lots of fruits and vegetables. Stay current with your vaccines. Schedule regular health, dental, and eye exams. Summary Having a healthy lifestyle and getting preventive care can help to protect your health and wellness after age 73. Screening and testing are the best way to find a health problem early and help you avoid having a fall. Early diagnosis and treatment give you the best chance for managing medical conditions that are more common for people who are older than age 73. Falls are a major cause of broken bones and head injuries in people who are older than age 73. Take precautions to prevent a fall at home. Work with your health care provider to learn what changes you can make to improve your health and wellness and to prevent falls. This information is not intended to replace advice given to you by your health care provider. Make sure you discuss any questions you have with your health care provider. Document Revised: 09/23/2020 Document Reviewed: 09/23/2020 Elsevier Patient Education  2023 [employer]  

## 2022-09-01 NOTE — Assessment & Plan Note (Signed)
Stable. Presently on baby aspirin and Plavix 75 mg daily

## 2022-09-01 NOTE — Progress Notes (Signed)
Adam Peters 72 y.o.   Chief Complaint  Patient presents with   Fall    Patient states he was walking yesterday and tried to catch his breath and patient fell,     HISTORY OF PRESENT ILLNESS: This is a 72 y.o. male complaining of near fall yesterday States he was walking and overdid it.  States he walked close to 3 miles.  At the end he felt winded and nearly falls. History of peripheral vascular disease.  Recently stented.  Started on Plavix. Denies chest pain or any other associated symptoms No other complaints or medical concerns today Accompanied by wife today. BP Readings from Last 3 Encounters:  09/01/22 130/72  07/02/22 124/68  05/20/22 126/74     Fall Pertinent negatives include no abdominal pain, fever, headaches, hematuria, nausea or vomiting.     Prior to Admission medications   Medication Sig Start Date End Date Taking? Authorizing Provider  aspirin EC 81 MG tablet Take 81 mg by mouth daily.   Yes [provider]  atorvastatin (LIPITOR) 40 MG tablet TAKE 1 TABLET DAILY 08/10/22  Yes [name], Lisette Abu, MD  azelastine (ASTELIN) 0.1 % nasal spray 2 sprays per nostril 1-2 times daily. 08/13/22  Yes Ambs, Norvel Richards, FNP  buPROPion (WELLBUTRIN XL) 150 MG 24 hr tablet Take 150 mg by mouth every morning. 12/10/19  Yes [provider]  Cholecalciferol (VITAMIN D) 50 MCG (2000 UT) tablet Take by mouth. 12/03/20  Yes [provider]  clopidogrel (PLAVIX) 75 MG tablet Take 1 tablet by mouth daily. 05/14/22  Yes [provider]  escitalopram (LEXAPRO) 20 MG tablet Take 25 mg by mouth daily.   Yes [provider]  fexofenadine (ALLEGRA) 180 MG tablet Take 1 tablet (180 mg total) by mouth daily. 07/23/22  Yes Ambs, Norvel Richards, FNP  fluticasone (FLONASE) 50 MCG/ACT nasal spray Place 2 sprays into both nostrils daily. 07/03/22  Yes Ambs, Norvel Richards, FNP  nitroGLYCERIN (NITROSTAT) 0.4 MG SL tablet DISSOLVE 1 TABLET UNDER THE TONGUE EVERY 5 MINUTES AS  NEEDED FOR CHEST PAIN 09/16/21  Yes [name], Lisette Abu, MD  olmesartan (BENICAR) 20 MG tablet TAKE 1 TABLET DAILY 02/24/22  Yes Georgina Quint, MD  omeprazole (PRILOSEC) 20 MG capsule TAKE 2 CAPSULES TWICE A DAY BEFORE MEALS 07/22/22  Yes [name], Lisette Abu, MD  silodosin (RAPAFLO) 8 MG CAPS capsule Take 8 mg by mouth daily. 09/19/21  Yes [provider]  STIOLTO RESPIMAT 2.5-2.5 MCG/ACT AERS Inhale 2 puffs into the lungs daily. 08/13/22  Yes Ambs, Norvel Richards, FNP  Vibegron (GEMTESA) 75 MG TABS  03/25/22  Yes [provider]  XARELTO 2.5 MG TABS tablet TAKE ONE TABLET BY MOUTH TWICE A DAY (TAKE WITH FOOD)*CAUTION: BLOOD THINNER* 05/14/22 05/15/23 Yes [provider]  amLODipine (NORVASC) 5 MG tablet Take 5 mg by mouth daily. Patient not taking: Reported on 07/02/2022 04/13/22   [provider]    Allergies  Allergen Reactions   Amoxicillin Diarrhea    GI upset.    Patient Active Problem List   Diagnosis Date Noted   Gastroesophageal reflux disease 07/03/2022   Cognitive changes 05/20/2022   Peripheral arterial disease 03/03/2022   Intermittent claudication 02/16/2022   Chronic right shoulder pain 02/16/2022   BPH with obstruction/lower urinary tract symptoms 09/08/2021   Moderate persistent asthma, uncomplicated 07/03/2021   OSA on CPAP 03/10/2021   Asthma 10/03/2020   Seasonal and perennial allergic rhinitis 04/04/2019   Ex-smoker 04/04/2019  Mild persistent asthma, uncomplicated 04/04/2019   Dyspnea on exertion 01/23/2016   Essential hypertension    Dyslipidemia    History of tobacco use 07/08/2012   Glaucoma 04/08/2012   DDD (degenerative disc disease), cervical    CAD- LAD BMS '97, RCA DES 08/02/13 08/17/2011    Past Medical History:  Diagnosis Date   Allergy    seasonal   Anemia    Anxiety    Asthma    CAD (coronary artery disease)    a. 1997 PCI/BMS to distal LAD ([facility] PS BMS);  b. 03/2011 Neg MV;  c. 07/2013 NSTEMI/PCI: LM 20d, LAD 85m,  50d isr, LCX nl, OM1/2 nl, RCA dom 95-4m (3.5x16 Promus premier DES), EF 55%, mild basal inf HK.   Cataract    Coronary artery disease    s/p PCI to LAD (1997) - Dr. Langston Reusing   GERD (gastroesophageal reflux disease)    Hepatitis C    History of tobacco use    Hyperlipidemia    Hypertension    Schizophrenia 1978    Past Surgical History:  Procedure Laterality Date   APPENDECTOMY  1965   CARDIAC CATHETERIZATION  2001   patent LAD stent (Dr Hiram Gash)   CORONARY ANGIOPLASTY WITH STENT PLACEMENT  06/09/1995   prox LAD stenting 80% to 0% (Dr. Langston Reusing) - repeat cath in 11/1999 showed no significant coronary disease & normal systolic function (Dr. Laurell Josephs)   CORONARY STENT PLACEMENT  08/02/2013   RCA   DES         DR COOPER   HERNIA REPAIR  1994   knife wound repair  1969   LEFT HEART CATHETERIZATION WITH CORONARY ANGIOGRAM N/A 08/02/2013   Procedure: LEFT HEART CATHETERIZATION WITH CORONARY ANGIOGRAM;  Surgeon: Micheline Chapman, MD;  Location: [facility] CATH LAB;  Service: Cardiovascular;  Laterality: N/A;   TRANSTHORACIC ECHOCARDIOGRAM  11/13/2008   EF 50-55%, normal LV systolic function; RA mildly dilated; trace MR/TR/PR    Social History   Socioeconomic History   Marital status: Married    Spouse name: Not on file   Number of children: Not on file   Years of education: B.A.   Highest education level: Not on file  Occupational History   Occupation: retired    Associate Professor: [employer]  Tobacco Use   Smoking status: Former    Packs/day: 1    Types: Cigarettes    Quit date: 05/18/1993    Years since quitting: 29.3   Smokeless tobacco: Never  Vaping Use   Vaping Use: Never used  Substance and Sexual Activity   Alcohol use: Yes    Alcohol/week: 3.0 standard drinks of alcohol    Types: 3 Glasses of wine per week    Comment: SOCIAL   Drug use: No   Sexual activity: Not Currently  Other Topics Concern   Not on file  Social History Narrative   Cafffeine" occasional.  Education 4 yr  college.  WorK: retired.     Social Determinants of Health   Financial Resource Strain: Low Risk  (11/12/2021)   Overall Financial Resource Strain (CARDIA)    Difficulty of Paying Living Expenses: Not hard at all  Food Insecurity: No Food Insecurity (11/12/2021)   Hunger Vital Sign    Worried About Running Out of Food in the Last Year: Never true    Ran Out of Food in the Last Year: Never true  Transportation Needs: No Transportation Needs (11/12/2021)   PRAPARE - Transportation  Lack of Transportation (Medical): No    Lack of Transportation (Non-Medical): No  Physical Activity: Sufficiently Active (11/12/2021)   Exercise Vital Sign    Days of Exercise per Week: 7 days    Minutes of Exercise per Session: 60 min  Stress: No Stress Concern Present (11/12/2021)   [employer] of Occupational Health - Occupational Stress Questionnaire    Feeling of Stress : Not at all  Social Connections: Socially Integrated (11/12/2021)   Social Connection and Isolation Panel [NHANES]    Frequency of Communication with Friends and Family: More than three times a week    Frequency of Social Gatherings with Friends and Family: More than three times a week    Attends Religious Services: More than 4 times per year    Active Member of [employer] or Organizations: Yes    Attends Engineer, structural: More than 4 times per year    Marital Status: Married  Catering manager Violence: Not At Risk (11/12/2021)   Humiliation, Afraid, Rape, and Kick questionnaire    Fear of Current or Ex-Partner: No    Emotionally Abused: No    Physically Abused: No    Sexually Abused: No    Family History  Problem Relation Age of Onset   Asthma Mother    Crohn's disease Mother    Heart disease Mother    Hepatitis C Brother    Testicular cancer Brother    Dementia Father    Diabetes Maternal Grandmother    Emphysema Maternal Aunt    Cancer Maternal Aunt    Colon cancer Neg Hx      Review of Systems   Constitutional: Negative.  Negative for chills and fever.  HENT: Negative.  Negative for congestion and sore throat.   Respiratory: Negative.  Negative for cough and shortness of breath.   Cardiovascular: Negative.  Negative for chest pain and palpitations.  Gastrointestinal:  Negative for abdominal pain, diarrhea, nausea and vomiting.  Genitourinary: Negative.  Negative for hematuria.  Skin: Negative.  Negative for rash.  Neurological: Negative.  Negative for dizziness and headaches.  All other systems reviewed and are negative.   Vitals:   09/01/22 1524  BP: 130/72  Pulse: 71  Temp: 98.4 F (36.9 C)  SpO2: 97%    Physical Exam Vitals reviewed.  Constitutional:      Appearance: Normal appearance.  HENT:     Head: Normocephalic.     Mouth/Throat:     Mouth: Mucous membranes are moist.     Pharynx: Oropharynx is clear.  Eyes:     Extraocular Movements: Extraocular movements intact.     Conjunctiva/sclera: Conjunctivae normal.     Pupils: Pupils are equal, round, and reactive to light.  Cardiovascular:     Rate and Rhythm: Normal rate and regular rhythm.     Pulses: Normal pulses.     Heart sounds: Normal heart sounds.  Pulmonary:     Effort: Pulmonary effort is normal.     Breath sounds: Normal breath sounds.  Abdominal:     Palpations: Abdomen is soft.     Tenderness: There is no abdominal tenderness.  Musculoskeletal:     Cervical back: No tenderness.     Right lower leg: No edema.     Left lower leg: No edema.  Lymphadenopathy:     Cervical: No cervical adenopathy.  Skin:    General: Skin is warm and dry.     Capillary Refill: Capillary refill takes less than 2 seconds.  Neurological:  General: No focal deficit present.     Mental Status: He is alert and oriented to person, place, and time.     Cranial Nerves: No cranial nerve deficit.     Sensory: No sensory deficit.     Motor: No weakness.     Coordination: Coordination normal.     Gait: Gait  normal.  Psychiatric:        Mood and Affect: Mood normal.        Behavior: Behavior normal.      ASSESSMENT & PLAN: A total of 44 minutes was spent with the patient and counseling/coordination of care regarding preparing for this visit, review of most recent office visit notes, review of multiple chronic medical conditions under management, review of all medications, prognosis, documentation, fall precautions, benefits of exercise, education on nutrition and need to stay well-hydrated, and need for follow-up.  Problem List Items Addressed This Visit       Cardiovascular and Mediastinum   Essential hypertension (Chronic)    Well-controlled hypertension. Continue olmesartan 20 mg daily      Peripheral arterial disease    Stable. Presently on baby aspirin and Plavix 75 mg daily        Other   Dyslipidemia    Chronic stable condition Continue atorvastatin 40 mg daily      Overexertion - Primary    Clinically stable.  No red flag signs or symptoms Had a near fall. Advised to decrease amount of physical activity and avoid point of exhaustion. Fall precautions given. Advised to stay well-hydrated and get plenty of rest Advised to maintain balance between resting and physical activity      Patient Instructions  Health Maintenance After Age 13 After age 67, you are at a higher risk for certain long-term diseases and infections as well as injuries from falls. Falls are a major cause of broken bones and head injuries in people who are older than age 93. Getting regular preventive care can help to keep you healthy and well. Preventive care includes getting regular testing and making lifestyle changes as recommended by your health care provider. Talk with your health care provider about: Which screenings and tests you should have. A screening is a test that checks for a disease when you have no symptoms. A diet and exercise plan that is right for you. What should I know about  screenings and tests to prevent falls? Screening and testing are the best ways to find a health problem early. Early diagnosis and treatment give you the best chance of managing medical conditions that are common after age 1. Certain conditions and lifestyle choices may make you more likely to have a fall. Your health care provider may recommend: Regular vision checks. Poor vision and conditions such as cataracts can make you more likely to have a fall. If you wear glasses, make sure to get your prescription updated if your vision changes. Medicine review. Work with your health care provider to regularly review all of the medicines you are taking, including over-the-counter medicines. Ask your health care provider about any side effects that may make you more likely to have a fall. Tell your health care provider if any medicines that you take make you feel dizzy or sleepy. Strength and balance checks. Your health care provider may recommend certain tests to check your strength and balance while standing, walking, or changing positions. Foot health exam. Foot pain and numbness, as well as not wearing proper footwear, can make you more likely  to have a fall. Screenings, including: Osteoporosis screening. Osteoporosis is a condition that causes the bones to get weaker and break more easily. Blood pressure screening. Blood pressure changes and medicines to control blood pressure can make you feel dizzy. Depression screening. You may be more likely to have a fall if you have a fear of falling, feel depressed, or feel unable to do activities that you used to do. Alcohol use screening. Using too much alcohol can affect your balance and may make you more likely to have a fall. Follow these instructions at home: Lifestyle Do not drink alcohol if: Your health care provider tells you not to drink. If you drink alcohol: Limit how much you have to: 0-1 drink a day for women. 0-2 drinks a day for men. Know how  much alcohol is in your drink. In the [place] one drink equals one 12 oz bottle of beer (355 mL), one 5 oz glass of wine (148 mL), or one 1 oz glass of hard liquor (44 mL). Do not use any products that contain nicotine or tobacco. These products include cigarettes, chewing tobacco, and vaping devices, such as e-cigarettes. If you need help quitting, ask your health care provider. Activity  Follow a regular exercise program to stay fit. This will help you maintain your balance. Ask your health care provider what types of exercise are appropriate for you. If you need a cane or walker, use it as recommended by your health care provider. Wear supportive shoes that have nonskid soles. Safety  Remove any tripping hazards, such as rugs, cords, and clutter. Install safety equipment such as grab bars in bathrooms and safety rails on stairs. Keep rooms and walkways well-lit. General instructions Talk with your health care provider about your risks for falling. Tell your health care provider if: You fall. Be sure to tell your health care provider about all falls, even ones that seem minor. You feel dizzy, tiredness (fatigue), or off-balance. Take over-the-counter and prescription medicines only as told by your health care provider. These include supplements. Eat a healthy diet and maintain a healthy weight. A healthy diet includes low-fat dairy products, low-fat (lean) meats, and fiber from whole grains, beans, and lots of fruits and vegetables. Stay current with your vaccines. Schedule regular health, dental, and eye exams. Summary Having a healthy lifestyle and getting preventive care can help to protect your health and wellness after age 78. Screening and testing are the best way to find a health problem early and help you avoid having a fall. Early diagnosis and treatment give you the best chance for managing medical conditions that are more common for people who are older than age 19. Falls are a  major cause of broken bones and head injuries in people who are older than age 53. Take precautions to prevent a fall at home. Work with your health care provider to learn what changes you can make to improve your health and wellness and to prevent falls. This information is not intended to replace advice given to you by your health care provider. Make sure you discuss any questions you have with your health care provider. Document Revised: 09/23/2020 Document Reviewed: 09/23/2020 Elsevier Patient Education  2023 Elsevier Inc.    [name], MD [facility]

## 2022-09-01 NOTE — Assessment & Plan Note (Signed)
Well-controlled hypertension. Continue olmesartan 20 mg daily

## 2022-09-21 DIAGNOSIS — R3914 Feeling of incomplete bladder emptying: Secondary | ICD-10-CM | POA: Diagnosis not present

## 2022-09-21 DIAGNOSIS — N401 Enlarged prostate with lower urinary tract symptoms: Secondary | ICD-10-CM | POA: Diagnosis not present

## 2022-09-21 DIAGNOSIS — N3941 Urge incontinence: Secondary | ICD-10-CM | POA: Diagnosis not present

## 2022-09-22 ENCOUNTER — Other Ambulatory Visit: Payer: Self-pay | Admitting: Allergy & Immunology

## 2022-09-23 ENCOUNTER — Other Ambulatory Visit: Payer: Self-pay | Admitting: Internal Medicine

## 2022-10-06 ENCOUNTER — Ambulatory Visit: Payer: Medicare Other | Admitting: Allergy & Immunology

## 2022-10-09 ENCOUNTER — Other Ambulatory Visit: Payer: Self-pay | Admitting: Family Medicine

## 2022-10-11 NOTE — Patient Instructions (Incomplete)
1. Moderate persistent asthma, uncomplicated -Will order a STAT chest x-ray due to productive cough. We will call you with results once they are back -Keep follow up with cardiologist in June- discuss shortness of breath -Consider lab work to see if he qualifies for biologic at next appointment - Daily controller medication(s):  Stilolto two puffs once daily. Reviewed proper technique. Try doing proper technique consistently to see if we get a decrease in symptoms. - Prior to physical activity: albuterol 2 puffs 10-15 minutes before physical activity. - Rescue medications: albuterol 4 puffs every 4-6 hours as needed - Asthma control goals:  * Full participation in all desired activities (may need albuterol before activity) * Albuterol use two time or less a week on average (not counting use with activity) * Cough interfering with sleep two time or less a month * Oral steroids no more than once a year * No hospitalizations  2. Seasonal and perennial allergic rhinitis (grasses, ragweed, trees, indoor molds, outdoor molds, cat and dog) - Continue with: Allegra (fexofenadine) 180mg  tablet once daily and Flonase (fluticasone) two sprays per nostril daily and Astelin (azelastine) 2 sprays per nostril 1-2 times daily. - Continue with salt water rinses before the nose sprays. - Allergy shots might be the best treatment option in the long run since that can decrease mucous production, but we will keep this in our back pocket.   3. Reflux Continue dietary and lifestyle modifications as listed below Continue Prilosec as previously prescribed. Take this medication at least 30 minutes before a meal for best result  Call the clinic if this treatment plan is not working well for you  Follow up in 4-6 weeks or sooner if needed.

## 2022-10-13 ENCOUNTER — Encounter: Payer: Self-pay | Admitting: Family

## 2022-10-13 ENCOUNTER — Ambulatory Visit (INDEPENDENT_AMBULATORY_CARE_PROVIDER_SITE_OTHER): Payer: Medicare Other | Admitting: Family

## 2022-10-13 ENCOUNTER — Other Ambulatory Visit: Payer: Self-pay

## 2022-10-13 ENCOUNTER — Ambulatory Visit (HOSPITAL_COMMUNITY)
Admission: RE | Admit: 2022-10-13 | Discharge: 2022-10-13 | Disposition: A | Discharge status: Home / Self Care | Payer: Medicare Other | Source: Ambulatory Visit | Attending: Family | Admitting: Family

## 2022-10-13 VITALS — BP 136/86 | HR 96 | Temp 98.2°F | Resp 18 | Ht 73.0 in | Wt 227.3 lb

## 2022-10-13 DIAGNOSIS — R058 Other specified cough: Secondary | ICD-10-CM

## 2022-10-13 DIAGNOSIS — K219 Gastro-esophageal reflux disease without esophagitis: Secondary | ICD-10-CM

## 2022-10-13 DIAGNOSIS — R059 Cough, unspecified: Secondary | ICD-10-CM | POA: Diagnosis not present

## 2022-10-13 DIAGNOSIS — J302 Other seasonal allergic rhinitis: Secondary | ICD-10-CM

## 2022-10-13 DIAGNOSIS — J3089 Other allergic rhinitis: Secondary | ICD-10-CM | POA: Diagnosis not present

## 2022-10-13 DIAGNOSIS — J4540 Moderate persistent asthma, uncomplicated: Secondary | ICD-10-CM | POA: Diagnosis not present

## 2022-10-13 DIAGNOSIS — J9811 Atelectasis: Secondary | ICD-10-CM | POA: Diagnosis not present

## 2022-10-13 MED ORDER — AZELASTINE HCL 0.1 % NA SOLN: NASAL | 1 refills | Status: DC

## 2022-10-13 MED ORDER — STIOLTO RESPIMAT 2.5-2.5 MCG/ACT IN AERS: 2.0000 | INHALATION_SPRAY | Freq: Every day | RESPIRATORY_TRACT | 3 refills | Status: DC

## 2022-10-13 MED ORDER — FLUTICASONE PROPIONATE 50 MCG/ACT NA SUSP: NASAL | 1 refills | Status: DC

## 2022-10-13 MED ORDER — FEXOFENADINE HCL 180 MG PO TABS: 180.0000 mg | ORAL_TABLET | Freq: Every day | ORAL | 1 refills | Status: DC | PRN

## 2022-10-13 NOTE — Telephone Encounter (Signed)
Refill sent today at today's office visit

## 2022-10-13 NOTE — Progress Notes (Signed)
[address] [place] [address] Dept: [phone]  FOLLOW UP NOTE  Patient ID: Adam Peters, male    DOB: [birth year]  Age: 72 y.o. MRN: [id number] Date of Office Visit: 10/13/2022  Assessment  Chief Complaint: Allergic Rhinitis  (Says he is well. ) and Asthma (Says he still has cough with colored discharge. )  HPI [name] is a 72 year old male who presents today for follow-up of moderate persistent asthma, seasonal and perennial allergic rhinitis, and gastroesophageal reflux disease.  He was last seen on July 02, 2022 by Thermon Leyland, FNP.  His wife is here with him today and provide history.  He reports that back in December/January and end of February he has had stents placed in his left and right leg.  He had 1 stent placed in his left leg and 5 stents in his right leg.  Moderate persistent asthma: He is currently using Stiolto 2 puffs once a day and his wife reports that he is not using his inhaler properly and is not using it the same way every day.  She has noticed when she works with him on it that his symptoms get better.  He was previously on [place], but this was stopped due to voice changes.  He reports he is no longer having voice changes since stopping [place].  He reports a productive cough with yellow phlegm for 3 months or better.  He also has shortness of breath.  He denies wheezing, tightness in chest, and nocturnal awakenings due to breathing problems.  Since last office visit he has not required any systemic steroids or made any trips to the emergency room or urgent care due to breathing problems.  He reports that he has been out of albuterol for at least a year. His wife also mentions that he has gained weight and wonders if this could be the cause of his shortness of breath.  Seasonal and perennial allergic rhinitis: He is currently taking Allegra 180 mg once a day, Flonase nasal spray daily, and azelastine nasal spray daily.   His wife also feels  like he does not do his nose sprays right and he forgets how to use them.  She reports that his depression has been worse and his memory is getting worse.  He reports rhinorrhea that at times will be brown, clear, or yellow.  He also has nasal congestion in the morning after taking off his CPAP.  He also reports postnasal drip.  He has not been treated for any sinus infections since we last saw him.  Reflux: He denies heartburn or reflux symptoms.  He is currently taking Prilosec twice a day.       Drug Allergies:  Allergies  Allergen Reactions   Amoxicillin Diarrhea    GI upset.    Review of Systems: Review of Systems  Constitutional:  Negative for chills and fever.  HENT:         Reports rhinorrhea that is sometimes clear sometimes brown, and sometimes yellow, nasal congestion, and postnasal drip  Eyes:        Denies itchy watery eyes  Respiratory:  Positive for cough and shortness of breath. Negative for wheezing.        Reports productive cough with yellow sputum for the past 3 months.  Also reports shortness of breath.  Denies wheezing, tightness in chest, and nocturnal awakenings due to breathing problems  Cardiovascular:  Negative for chest pain and palpitations.  Gastrointestinal:  Denies heartburn or reflux symptoms.  Currently on Prilosec twice a day.  Skin:  Positive for rash. Negative for itching.       Reports rash on back that the [place] has prescribed a cream for  Neurological:  Negative for headaches.  Endo/Heme/Allergies:  Positive for environmental allergies.     Physical Exam: BP 136/86   Pulse 96   Temp 98.2 F (36.8 C) (Temporal)   Resp 18   Ht 6\' 1"  (1.854 m)   Wt 227 lb 4.8 oz (103.1 kg)   SpO2 95%   BMI 29.99 kg/m    Physical Exam Exam conducted with a chaperone present.  Constitutional:      Appearance: Normal appearance.  HENT:     Head: Normocephalic and atraumatic.     Comments: Pharynx normal, eyes normal, ears normal, nose:  Bilateral lower turbinates mildly edematous and slightly erythematous with no drainage noted    Right Ear: Tympanic membrane, ear canal and external ear normal.     Left Ear: Tympanic membrane, ear canal and external ear normal.     Mouth/Throat:     Mouth: Mucous membranes are moist.     Pharynx: Oropharynx is clear.  Eyes:     Conjunctiva/sclera: Conjunctivae normal.  Cardiovascular:     Rate and Rhythm: Regular rhythm.     Heart sounds: Normal heart sounds.  Pulmonary:     Effort: Pulmonary effort is normal.     Breath sounds: Normal breath sounds.     Comments: Lungs clear to auscultation Musculoskeletal:     Cervical back: Neck supple.  Skin:    General: Skin is warm.  Neurological:     Mental Status: He is alert and oriented to person, place, and time.  Psychiatric:        Mood and Affect: Mood normal.        Behavior: Behavior normal.        Thought Content: Thought content normal.        Judgment: Judgment normal.     Diagnostics: FVC 4.26 L (97%), FEV1 2.87 L (85%).  Spirometry indicates normal spirometry.  Assessment and Plan: 1. Not well controlled moderate persistent asthma   2. Productive cough   3. Seasonal and perennial allergic rhinitis   4. Gastroesophageal reflux disease, unspecified whether esophagitis present     No orders of the defined types were placed in this encounter.   Patient Instructions  1. Moderate persistent asthma, uncomplicated -Will order a STAT chest x-ray due to productive cough. We will call you with results once they are back -Keep follow up with cardiologist in June- discuss shortness of breath -Consider lab work to see if he qualifies for biologic at next appointment - Daily controller medication(s):  Stilolto two puffs once daily. Reviewed proper technique. Try doing proper technique consistently to see if we get a decrease in symptoms. - Prior to physical activity: albuterol 2 puffs 10-15 minutes before physical activity. -  Rescue medications: albuterol 4 puffs every 4-6 hours as needed - Asthma control goals:  * Full participation in all desired activities (may need albuterol before activity) * Albuterol use two time or less a week on average (not counting use with activity) * Cough interfering with sleep two time or less a month * Oral steroids no more than once a year * No hospitalizations  2. Seasonal and perennial allergic rhinitis (grasses, ragweed, trees, indoor molds, outdoor molds, cat and dog) - Continue with: Allegra (fexofenadine) 180mg  tablet once  daily and Flonase (fluticasone) two sprays per nostril daily and Astelin (azelastine) 2 sprays per nostril 1-2 times daily. - Continue with salt water rinses before the nose sprays. - Allergy shots might be the best treatment option in the long run since that can decrease mucous production, but we will keep this in our back pocket.   3. Reflux Continue dietary and lifestyle modifications as listed below Continue Prilosec as previously prescribed. Take this medication at least 30 minutes before a meal for best result  Call the clinic if this treatment plan is not working well for you  Follow up in 4-6 weeks or sooner if needed.      Return in about 4 weeks (around 11/10/2022), or if symptoms worsen or fail to improve.    Thank you for the opportunity to care for this patient.  Please do not hesitate to contact me with questions.  Nehemiah Settle, FNP [facility]

## 2022-10-13 NOTE — Progress Notes (Signed)
Please let Adam Peters know that his chest x-ray is essentially normal

## 2022-10-19 ENCOUNTER — Other Ambulatory Visit: Payer: Self-pay | Admitting: Internal Medicine

## 2022-10-22 ENCOUNTER — Ambulatory Visit: Payer: Medicare Other | Attending: Internal Medicine | Admitting: Internal Medicine

## 2022-10-22 ENCOUNTER — Encounter: Payer: Self-pay | Admitting: Internal Medicine

## 2022-10-22 ENCOUNTER — Telehealth (HOSPITAL_COMMUNITY): Payer: Self-pay

## 2022-10-22 VITALS — BP 124/72 | HR 76 | Ht 74.0 in | Wt 233.0 lb

## 2022-10-22 DIAGNOSIS — I2510 Atherosclerotic heart disease of native coronary artery without angina pectoris: Secondary | ICD-10-CM

## 2022-10-22 DIAGNOSIS — R0600 Dyspnea, unspecified: Secondary | ICD-10-CM | POA: Insufficient documentation

## 2022-10-22 DIAGNOSIS — E785 Hyperlipidemia, unspecified: Secondary | ICD-10-CM | POA: Diagnosis not present

## 2022-10-22 DIAGNOSIS — I739 Peripheral vascular disease, unspecified: Secondary | ICD-10-CM

## 2022-10-22 NOTE — Patient Instructions (Signed)
Medication Instructions:  NO CHANGES  *If you need a refill on your cardiac medications before your next appointment, please call your pharmacy*   Lab Work: FASTING lab work to check cholesterol -- lipid panel, LPa  If you have labs (blood work) drawn today and your tests are completely normal, you will receive your results only by: MyChart Message (if you have MyChart) OR A paper copy in the mail If you have any lab test that is abnormal or we need to change your treatment, we will call you to review the results.   Testing/Procedures: Dr. Rennis Golden has ordered a Myocardial Perfusion Imaging Study.   The test will take approximately 3 to 4 hours to complete; you may bring reading material.  If someone comes with you to your appointment, they will need to remain in the main lobby due to limited space in the testing area. **If you are pregnant or breastfeeding, please notify the nuclear lab prior to your appointment**  You will need to hold the following medications prior to your stress test: beta-blockers (24 hours prior to test)   How to prepare for your Myocardial Perfusion Test: Do not eat or drink 3 hours prior to your test, except you may have water. Do not consume products containing caffeine (regular or decaffeinated) 12 hours prior to your test. (ex: coffee, chocolate, sodas, tea). Do wear comfortable clothes (no dresses or overalls) and walking shoes, tennis shoes preferred (No heels or open toe shoes are allowed). Do NOT wear cologne, perfume, aftershave, or lotions (deodorant is allowed). If these instructions are not followed, your test will have to be rescheduled.    Follow-Up: At [facility], you and your health needs are our priority.  As part of our continuing mission to provide you with exceptional heart care, we have created designated Provider Care Teams.  These Care Teams include your primary Cardiologist (physician) and Advanced Practice Providers (APPs -   Physician Assistants and Nurse Practitioners) who all work together to provide you with the care you need, when you need it.  We recommend signing up for the patient portal called "MyChart".  Sign up information is provided on this After Visit Summary.  MyChart is used to connect with patients for Virtual Visits (Telemedicine).  Patients are able to view lab/test results, encounter notes, upcoming appointments, etc.  Non-urgent messages can be sent to your provider as well.   To learn more about what you can do with MyChart, go to [email or web address].    Your next appointment:    After stress testing -- with Dr. Rennis Golden or NP/PA

## 2022-10-22 NOTE — Progress Notes (Signed)
OFFICE NOTE  Chief Complaint:  Shortness of breath  Primary Care Physician: Georgina Quint, MD  HPI:  Adam Peters is a 72 year old gentleman who I have been following for history of coronary disease status post PCI of the LAD in 1997 (with a Palmaz-Schatz stent). He has history of smoking in the past but has discontinued that. Echo in 2010 showed a low normal EF and he had a stress test which was negative. Recently, this past fall he was contemplating treatment for hepatitis C and underwent a stress test which was negative for ischemia. At that time, EF was 51%. He, unfortunately, did not undergo treatment at that time; however, has since been set up to see Dr. Jacqualine Mau at [facility] by you who was contemplating treatment starting in December. He is also contemplating retirement at the end of January of next year.   When I last saw him about 6 months ago he was doing fairly well. I understand he was seen just a few days ago in urgent care. He had been doing some new exercise routine and had noted tightness after exercise in his chest. The symptoms were certainly worse with exertion and ultimately did relieve themselves mostly at rest. Since that time he's had worsening shortness of breath and discomfort in his chest especially when walking up stairs or doing most activities. He appears visibly disturbed by this today. He did have a chest x-ray performed which showed no acute disease. An EKG performed at his primary care provider's office showed sinus bradycardia with no ischemic changes. A troponin was checked and was positive at 0.21.  Adam Peters was subsequently referred for cardiac catheterization and found to have acute right coronary artery occlusion. He underwent placement of a Promus Premier 3.5 x 16 mm drug-eluting stent to the mid right coronary artery. Since then he's had no significant anginal symptoms. He's been active and exercising. Unfortunately he has not been able to get  treatment for his hepatitis C due to a shortage of medications. He has however recently been having problems in his left shoulder and left upper chest. He was concerned about this being ischemia. The symptoms are worse when swimming particularly and he feels pain in his left shoulder when outstretching his arm.  Adam Peters returns today for follow-up. He is feeling quite well. He denies any chest pain or shortness of breath. He continues to be active and without complaints. He is scheduled to have treatment for his hepatitis C next month. Hopefully that can be cured. He's a wart a repeat check of his cholesterol. It's now been almost one year since his drug-eluting stent placement  I saw Adam Peters back in the office today. He is doing exceedingly well. He denies any chest pain or shortness of breath. He recently had treatment of his hepatitis C which is cured by Harvoni. He was having some reflux symptoms but those have improved with an increase in his Prilosec to 40 mg daily. Blood pressure is well-controlled.  01/23/2016  Adam Peters returns today for follow-up. He reports recently he started swimming again and he's noted some shortness of breath which is unusual for him. He's gone swimming 3 times and is completely exhausted after doing 1 lap in the pool. [place] it is a 50 m pool. He says that he doesn't get that short of breath when exercising on a treadmill or doing other activities. He's not sure if it's deconditioning. He does not have any chest pain.  It's now been 2 years since his last coronary stent. He is also not had a repeat lipid profile in one year. Previously his cholesterol is been well controlled.  02/21/2016  Adam Peters was seen today in follow-up. He underwent nuclear stress testing which was negative for ischemia. He still feels some fatigue and dyspnea with exertion. He's not exercising as much as he had been. He thinks it may be related to the beta  blocker.  08/24/2016  Adam Peters returns today for follow-up. He seems to be doing extremely well. He's exercising regularly at the [facility] and is lost almost 30 pounds from 235 down to 208 pounds. Blood pressure is actually low normal today 102/68. He says he feels great. He denies any worsening chest pain or shortness of breath. He was successfully treated for hepatitis C with Harvoni and is now cured. Of note his recent lipid profile in September showed total cholesterol 157, HDL-C 44, LDL-C 100 and triglycerides 66. Goal LDL-C less than 70. He is on low-dose Lipitor because of elevated liver enzymes however this is likely related to hepatitis C which is now cured.  02/22/2017   Adam Peters returns for follow-up. Over the past year he's done well. He continues to be physically active. He exercises regularly. He feels great. His last LDL was 74 6 months ago. This is down from 100 one year ago. He is on atorvastatin 20 mg daily. We have previously increase the dose from 10 mg. He denies any worsening shortness of breath. He occasionally gets some cramping in his legs when he runs. Blood pressures have been well controlled.  01/07/2018  Adam Peters seen today in follow-up.  He denies any chest pain or worsening shortness of breath.  Blood pressure is well controlled today 126/64.  He is physically active and exercises regularly.  In fact recently called in to see if he could increase his exercise and weight lifting which certainly is okay to do.  He is on low-dose atorvastatin.  This is primarily in the past due to hepatitis however that has been cured and notably recently his LDL was 83, still not at goal less than 70.  01/10/2019  Adam Peters is seen today for follow-up.  Overall seems to be doing well.  He was hospitalized in the fall for dehydration.  This is resolved and he is been more cautious about it.  Recently he was doing similar exercises and thinks he did an unusual type of push-up with  his arms extended wider than his shoulders.  He then developed some pain in the left shoulder.  This is been persistent and he is now stopped his exercises.  He is concerned about a possible rotator cuff injury.  He denies any cardiac chest pain.  He had reasonably controlled lipids with a recent increase in his atorvastatin about 9 months ago but is due for repeat assessment.  03/04/2020  Adam Peters returns today for routine follow-up.  He continues to do well.  He is physically active and is asymptomatic.  Blood pressure is excellent today 113/63.  He reports compliance with his medications.  He did have lipid testing about a year ago in August showing total cholesterol of 138, HDL 58, LDL of 71 and triglycerides 43.  Hemoglobin A1c is exceedingly low at 4.4.  EKG today shows sinus rhythm at 69 without ischemic changes.  He remains active and tries to exercise.  He was successfully treated and cured of hepatitis C.  07/17/2021  Adam Peters returns today for follow-up.  Recently has been having some issues with dizziness and positional dizziness as well as dry mouth.  Recently had been started on some additional medications including Latuda and benztropine for side effects such as akathisia.  The benztropine certainly could cause a dry mouth and some side effects and Latuda very well could be associated with some of his other side effects as well.  He really does not describe any chest pain.  He has no symptoms similar to what he had in 2015.  EKG is normal today.  Blood pressure is well controlled.  His cholesterol is at target with LDL 59 in October.  10/22/2022  Adam Peters is seen today in follow-up.  He has been followed closely at the [place].  He recently tells me that he had issues with claudication and underwent peripheral angiography by Dr. Loralie Champagne.  He then had stents placed in the SFA and left popliteal artery.  He reports his claudication has improved and he has follow-up with the [place]  tomorrow.  He is also had some worsening shortness of breath with exertion.  He has gained some weight, about 10 pounds over the past several months.  He says he has been eating more when going out to the bar with friends.  He reports his activity is about the same.  He says he gets short of breath when walking up hills and had 1 episode of chest discomfort which he took nitroglycerin for and it was promptly relieved.  PMHx:  Past Medical History:  Diagnosis Date   Allergy    seasonal   Anemia    Anxiety    Asthma    CAD (coronary artery disease)    a. 1997 PCI/BMS to distal LAD (J&J PS BMS);  b. 03/2011 Neg MV;  c. 07/2013 NSTEMI/PCI: LM 20d, LAD 67m, 50d isr, LCX nl, OM1/2 nl, RCA dom 95-63m (3.5x16 Promus premier DES), EF 55%, mild basal inf HK.   Cataract    Coronary artery disease    s/p PCI to LAD (1997) - Dr. Langston Reusing   GERD (gastroesophageal reflux disease)    Hepatitis C    History of tobacco use    Hyperlipidemia    Hypertension    Schizophrenia (HCC) 1978    Past Surgical History:  Procedure Laterality Date   APPENDECTOMY  1965   CARDIAC CATHETERIZATION  2001   patent LAD stent (Dr Hiram Gash)   CORONARY ANGIOPLASTY WITH STENT PLACEMENT  06/09/1995   prox LAD stenting 80% to 0% (Dr. Langston Reusing) - repeat cath in 11/1999 showed no significant coronary disease & normal systolic function (Dr. Laurell Josephs)   CORONARY STENT PLACEMENT  08/02/2013   RCA   DES         DR COOPER   CORONARY STENT PLACEMENT  07/16/2022   HERNIA REPAIR  1994   knife wound repair  1969   LEFT HEART CATHETERIZATION WITH CORONARY ANGIOGRAM N/A 08/02/2013   Procedure: LEFT HEART CATHETERIZATION WITH CORONARY ANGIOGRAM;  Surgeon: Micheline Chapman, MD;  Location: [facility] CATH LAB;  Service: Cardiovascular;  Laterality: N/A;   TRANSTHORACIC ECHOCARDIOGRAM  11/13/2008   EF 50-55%, normal LV systolic function; RA mildly dilated; trace MR/TR/PR    FAMHx:  Family History  Problem Relation Age of Onset   Asthma Mother     Crohn's disease Mother    Heart disease Mother    Hepatitis C Brother    Testicular cancer Brother    Dementia  Father    Diabetes Maternal Grandmother    Emphysema Maternal Aunt    Cancer Maternal Aunt    Colon cancer Neg Hx     SOCHx:   reports that he quit smoking about 29 years ago. His smoking use included cigarettes. He smoked an average of 1 pack per day. He has never used smokeless tobacco. He reports current alcohol use of about 3.0 standard drinks of alcohol per week. He reports that he does not use drugs.  ALLERGIES:  Allergies  Allergen Reactions   Amoxicillin Diarrhea    GI upset.    ROS: Pertinent items noted in HPI and remainder of comprehensive ROS otherwise negative.  HOME MEDS: Current Outpatient Medications  Medication Sig Dispense Refill   amLODipine (NORVASC) 5 MG tablet Take 5 mg by mouth daily.     aspirin EC 81 MG tablet Take 81 mg by mouth daily.     atorvastatin (LIPITOR) 40 MG tablet TAKE 1 TABLET DAILY 90 tablet 3   azelastine (ASTELIN) 0.1 % nasal spray Use 1 to 2 sprays in each nostril 1-2 times a day as needed for runny nose/drainage down throat 90 mL 1   buPROPion (WELLBUTRIN XL) 150 MG 24 hr tablet Take 150 mg by mouth every morning.     Cholecalciferol (VITAMIN D) 50 MCG (2000 UT) tablet Take by mouth.     clopidogrel (PLAVIX) 75 MG tablet Take 1 tablet by mouth daily.     escitalopram (LEXAPRO) 20 MG tablet Take 25 mg by mouth daily.     fexofenadine (ALLEGRA) 180 MG tablet Take 1 tablet (180 mg total) by mouth daily as needed for allergies or rhinitis (Can take an extra dose during flare ups.). 90 tablet 1   fluticasone (FLONASE) 50 MCG/ACT nasal spray Use 2 sprays in each nostril once a day as needed for stuffy nose 48 g 1   nitroGLYCERIN (NITROSTAT) 0.4 MG SL tablet DISSOLVE 1 TABLET UNDER THE TONGUE EVERY 5 MINUTES AS NEEDED FOR CHEST PAIN (SCHEDULE AN APPOINTMENT FOR FUTURE REFILLS) 25 tablet 11   olmesartan (BENICAR) 20 MG tablet TAKE  1 TABLET DAILY 90 tablet 3   omeprazole (PRILOSEC) 20 MG capsule TAKE 2 CAPSULES TWICE A DAY BEFORE MEALS 360 capsule 3   silodosin (RAPAFLO) 8 MG CAPS capsule Take 8 mg by mouth daily.     STIOLTO RESPIMAT 2.5-2.5 MCG/ACT AERS Inhale 2 puffs into the lungs daily. 4 g 3   Vibegron (GEMTESA) 75 MG TABS      XARELTO 2.5 MG TABS tablet TAKE ONE TABLET BY MOUTH TWICE A DAY (TAKE WITH FOOD)*CAUTION: BLOOD THINNER*     No current facility-administered medications for this visit.    LABS/IMAGING: No results found for this or any previous visit (from the past 48 hour(s)). No results found.  VITALS: BP 124/72 (BP Location: Left Arm, Patient Position: Sitting, Cuff Size: Normal)   Pulse 76   Ht 6\' 2"  (1.88 m)   Wt 233 lb (105.7 kg)   SpO2 95%   BMI 29.92 kg/m   EXAM: General appearance: alert and no distress Neck: no carotid bruit and no JVD Lungs: clear to auscultation bilaterally Heart: regular rate and rhythm, S1, S2 normal, no murmur, click, rub or gallop Abdomen: soft, non-tender; bowel sounds normal; no masses,  no organomegaly Extremities: extremities normal, atraumatic, no cyanosis or edema Pulses: 2+ and symmetric Skin: Skin color, texture, turgor normal. No rashes or lesions Neurologic: Grossly normal Psych: Pleasant  FUX:NATFT rhythm at  76, minimal voltage criteria for LVH -personally reviewed  ASSESSMENT: Progressive dyspnea on exertion/chest tightness PAD status post left SFA and popliteal intervention by Dr. Loralie Champagne at the [facility] (2024) Coronary artery disease status post PCI in 1997 (PS Stent to proximal LAD) and 2015 (Promus premier DES to RCA) Hepatitis C-now cured after treatment with Harvoni Dyslipidemia Hypertension  PLAN: 1.   Adam Peters had apparently underwent recent peripheral angiography and stenting for PAD at the [facility] namely in the left SFA and popliteal arteries.  I believe he had some disease on the right as well.  Will try to get records from the [place].  He  is also describing a progressive dyspnea on exertion and chest tightness.  He had prior coronary stenting in 2015 and has not had any ischemic evaluation since 2017.  I recommend an exercise Myoview at this point he feels like he can walk without any claudication.  Will also obtain labs including a lipid profile and an LP(a).  Follow-up with me afterwards.  [name], MD, [facility], FACP  [place]  [facility] [place] [facility]  Medical Director of the [facility]  [facility]ovascular Risk Reduction [facility] Diplomate of the American Board of Clinical Lipidology Attending Cardiologist  Direct Dial: [phone]  Fax: [phone]  Website:  [email or web address] Nicely Clista Rainford 10/22/2022, 8:09 AM

## 2022-10-22 NOTE — Telephone Encounter (Signed)
Detailed instructions left on the patient's answering machine. Asked to call back with any questions. S.Linkin Vizzini EMT/CCT 

## 2022-10-23 DIAGNOSIS — E785 Hyperlipidemia, unspecified: Secondary | ICD-10-CM | POA: Diagnosis not present

## 2022-10-27 ENCOUNTER — Ambulatory Visit: Payer: Medicare Other | Admitting: Allergy & Immunology

## 2022-10-27 ENCOUNTER — Ambulatory Visit (HOSPITAL_COMMUNITY): Payer: Medicare Other | Attending: Cardiovascular Disease

## 2022-10-27 DIAGNOSIS — R0600 Dyspnea, unspecified: Secondary | ICD-10-CM | POA: Diagnosis not present

## 2022-10-27 DIAGNOSIS — I2510 Atherosclerotic heart disease of native coronary artery without angina pectoris: Secondary | ICD-10-CM

## 2022-10-27 LAB — MYOCARDIAL PERFUSION IMAGING
Angina Index: 0
Duke Treadmill Score: -5
Estimated workload: 6.5
Exercise duration (min): 4 min
Exercise duration (sec): 39 s
LV dias vol: 126 mL (ref 62–150)
LV sys vol: 66 mL
MPHR: 148 {beats}/min
Nuc Stress EF: 48 %
Peak HR: 150 {beats}/min
Percent HR: 101 %
Rest HR: 56 {beats}/min
Rest Nuclear Isotope Dose: 10.8 mCi
SDS: 3
SRS: 1
SSS: 4
ST Depression (mm): 2 mm
Stress Nuclear Isotope Dose: 32.5 mCi
TID: 0.95

## 2022-10-27 MED ORDER — TECHNETIUM TC 99M TETROFOSMIN IV KIT
10.8000 | PACK | Freq: Once | INTRAVENOUS | Status: AC | PRN
  Administered 2022-10-27: 10.8 via INTRAVENOUS

## 2022-10-27 MED ORDER — TECHNETIUM TC 99M TETROFOSMIN IV KIT
32.5000 | PACK | Freq: Once | INTRAVENOUS | Status: AC | PRN
  Administered 2022-10-27: 32.5 via INTRAVENOUS

## 2022-10-28 ENCOUNTER — Encounter: Payer: Self-pay | Admitting: Internal Medicine

## 2022-10-28 LAB — LIPID PANEL
Chol/HDL Ratio: 2.8 ratio (ref 0.0–5.0)
Cholesterol, Total: 130 mg/dL (ref 100–199)
HDL: 47 mg/dL (ref >39)
LDL Chol Calc (NIH): 73 mg/dL (ref 0–99)
Triglycerides: 44 mg/dL (ref 0–149)
VLDL Cholesterol Cal: 10 mg/dL (ref 5–40)

## 2022-10-28 LAB — LIPOPROTEIN A (LPA): Lipoprotein (a): 187.3 nmol/L — ABNORMAL HIGH (ref <75.0)

## 2022-11-02 ENCOUNTER — Telehealth: Payer: Self-pay

## 2022-11-02 ENCOUNTER — Other Ambulatory Visit (HOSPITAL_COMMUNITY): Payer: Self-pay

## 2022-11-02 ENCOUNTER — Other Ambulatory Visit: Payer: Self-pay | Admitting: *Deleted

## 2022-11-02 DIAGNOSIS — I2510 Atherosclerotic heart disease of native coronary artery without angina pectoris: Secondary | ICD-10-CM

## 2022-11-02 DIAGNOSIS — E785 Hyperlipidemia, unspecified: Secondary | ICD-10-CM

## 2022-11-02 DIAGNOSIS — I739 Peripheral vascular disease, unspecified: Secondary | ICD-10-CM

## 2022-11-02 DIAGNOSIS — E7841 Elevated Lipoprotein(a): Secondary | ICD-10-CM

## 2022-11-02 NOTE — Telephone Encounter (Signed)
Pharmacy Patient Advocate Encounter  Prior Authorization for REPATHA has been APPROVED by [employer] from 5.18.24 to 12/31.2099.   PA # [id number]

## 2022-11-03 MED ORDER — REPATHA SURECLICK 140 MG/ML ~~LOC~~ SOAJ: 140.0000 mg | SUBCUTANEOUS | 3 refills | Status: DC

## 2022-11-10 ENCOUNTER — Ambulatory Visit: Payer: Medicare Other | Admitting: Family

## 2022-11-16 ENCOUNTER — Ambulatory Visit (INDEPENDENT_AMBULATORY_CARE_PROVIDER_SITE_OTHER): Payer: Medicare Other

## 2022-11-16 DIAGNOSIS — Z0000 Encounter for general adult medical examination without abnormal findings: Secondary | ICD-10-CM

## 2022-11-16 NOTE — Progress Notes (Signed)
Subjective:   Adam Peters is a 72 y.o. male who presents for Medicare Annual/Subsequent preventive examination.  Visit Complete: Virtual  I connected with  Adam Peters on 11/16/22 by a audio enabled telemedicine application and verified that I am speaking with the correct person using two identifiers.  Patient Location: Home  Provider Location: Home Office  I discussed the limitations of evaluation and management by telemedicine. The patient expressed understanding and agreed to proceed.  Patient Medicare AWV questionnaire was completed by the patient on 11/16/2022; I have confirmed that all information answered by patient is correct and no changes since this date.  Cardiac Risk Factors include: advanced age (>86men, >55 women);male gender;hypertension     Objective:    Today's Vitals   There is no height or weight on file to calculate BMI.     11/16/2022   12:31 PM 03/13/2022   10:15 AM 10/04/2020    2:30 PM 09/15/2019    9:09 AM 01/11/2018   11:16 PM 01/11/2018    4:26 AM 10/20/2016    3:56 PM  Advanced Directives  Does Patient Have a Medical Advance Directive? Yes No No No  No No  Type of Estate agent of [place];Living will        Copy of Healthcare Power of Attorney in Chart? No - copy requested        Would patient like information on creating a medical advance directive?  No - Patient declined No - Patient declined Yes (ED - Information included in AVS) No - Patient declined      Current Medications (verified) Outpatient Encounter Medications as of 11/16/2022  Medication Sig   amLODipine (NORVASC) 5 MG tablet Take 5 mg by mouth daily.   aspirin EC 81 MG tablet Take 81 mg by mouth daily.   atorvastatin (LIPITOR) 40 MG tablet TAKE 1 TABLET DAILY   azelastine (ASTELIN) 0.1 % nasal spray Use 1 to 2 sprays in each nostril 1-2 times a day as needed for runny nose/drainage down throat   Cholecalciferol (VITAMIN D) 50 MCG (2000 UT) tablet  Take by mouth.   clopidogrel (PLAVIX) 75 MG tablet Take 1 tablet by mouth daily.   escitalopram (LEXAPRO) 20 MG tablet Take 25 mg by mouth daily.   Evolocumab (REPATHA SURECLICK) 140 MG/ML SOAJ Inject 140 mg into the skin every 14 (fourteen) days.   fexofenadine (ALLEGRA) 180 MG tablet Take 1 tablet (180 mg total) by mouth daily as needed for allergies or rhinitis (Can take an extra dose during flare ups.).   fluticasone (FLONASE) 50 MCG/ACT nasal spray Use 2 sprays in each nostril once a day as needed for stuffy nose   nitroGLYCERIN (NITROSTAT) 0.4 MG SL tablet DISSOLVE 1 TABLET UNDER THE TONGUE EVERY 5 MINUTES AS NEEDED FOR CHEST PAIN (SCHEDULE AN APPOINTMENT FOR FUTURE REFILLS)   olmesartan (BENICAR) 20 MG tablet TAKE 1 TABLET DAILY   omeprazole (PRILOSEC) 20 MG capsule TAKE 2 CAPSULES TWICE A DAY BEFORE MEALS   silodosin (RAPAFLO) 8 MG CAPS capsule Take 8 mg by mouth daily.   STIOLTO RESPIMAT 2.5-2.5 MCG/ACT AERS Inhale 2 puffs into the lungs daily.   Vibegron (GEMTESA) 75 MG TABS    [DISCONTINUED] buPROPion (WELLBUTRIN XL) 150 MG 24 hr tablet Take 150 mg by mouth every morning.   [DISCONTINUED] XARELTO 2.5 MG TABS tablet TAKE ONE TABLET BY MOUTH TWICE A DAY (TAKE WITH FOOD)*CAUTION: BLOOD THINNER*   No facility-administered encounter medications on file as of  11/16/2022.    Allergies (verified) Amoxicillin   History: Past Medical History:  Diagnosis Date   Allergy    seasonal   Anemia    Anxiety    Asthma    CAD (coronary artery disease)    a. 1997 PCI/BMS to distal LAD (J&J PS BMS);  b. 03/2011 Neg MV;  c. 07/2013 NSTEMI/PCI: LM 20d, LAD 51m, 50d isr, LCX nl, OM1/2 nl, RCA dom 95-46m (3.5x16 Promus premier DES), EF 55%, mild basal inf HK.   Cataract    Coronary artery disease    s/p PCI to LAD (1997) - Dr. Langston Reusing   GERD (gastroesophageal reflux disease)    Hepatitis C    History of tobacco use    Hyperlipidemia    Hypertension    Schizophrenia (HCC) 1978   Past Surgical  History:  Procedure Laterality Date   APPENDECTOMY  1965   CARDIAC CATHETERIZATION  2001   patent LAD stent (Dr Hiram Gash)   CORONARY ANGIOPLASTY WITH STENT PLACEMENT  06/09/1995   prox LAD stenting 80% to 0% (Dr. Langston Reusing) - repeat cath in 11/1999 showed no significant coronary disease & normal systolic function (Dr. Laurell Josephs)   CORONARY STENT PLACEMENT  08/02/2013   RCA   DES         DR COOPER   CORONARY STENT PLACEMENT  07/16/2022   HERNIA REPAIR  1994   knife wound repair  1969   LEFT HEART CATHETERIZATION WITH CORONARY ANGIOGRAM N/A 08/02/2013   Procedure: LEFT HEART CATHETERIZATION WITH CORONARY ANGIOGRAM;  Surgeon: Micheline Chapman, MD;  Location: [facility] CATH LAB;  Service: Cardiovascular;  Laterality: N/A;   TRANSTHORACIC ECHOCARDIOGRAM  11/13/2008   EF 50-55%, normal LV systolic function; RA mildly dilated; trace MR/TR/PR   Family History  Problem Relation Age of Onset   Asthma Mother    Crohn's disease Mother    Heart disease Mother    Hepatitis C Brother    Testicular cancer Brother    Dementia Father    Diabetes Maternal Grandmother    Emphysema Maternal Aunt    Cancer Maternal Aunt    Colon cancer Neg Hx    Social History   Socioeconomic History   Marital status: Married    Spouse name: Not on file   Number of children: Not on file   Years of education: B.A.   Highest education level: Not on file  Occupational History   Occupation: retired    Associate Professor: [employer]  Tobacco Use   Smoking status: Former    Packs/day: 1    Types: Cigarettes    Quit date: 05/18/1993    Years since quitting: 29.5   Smokeless tobacco: Never  Vaping Use   Vaping Use: Never used  Substance and Sexual Activity   Alcohol use: Yes    Alcohol/week: 3.0 standard drinks of alcohol    Types: 3 Glasses of wine per week    Comment: SOCIAL   Drug use: No   Sexual activity: Not Currently  Other Topics Concern   Not on file  Social History Narrative   Cafffeine" occasional.  Education 4  yr college.  WorK: retired.     Social Determinants of Health   Financial Resource Strain: Patient Declined (11/16/2022)   Overall Financial Resource Strain (CARDIA)    Difficulty of Paying Living Expenses: Patient declined  Food Insecurity: Food Insecurity Present (11/16/2022)   Hunger Vital Sign    Worried About Running Out of Food in the Last  Year: Sometimes true    Ran Out of Food in the Last Year: Sometimes true  Transportation Needs: No Transportation Needs (11/16/2022)   PRAPARE - Administrator, Civil Service (Medical): No    Lack of Transportation (Non-Medical): No  Physical Activity: Sufficiently Active (11/16/2022)   Exercise Vital Sign    Days of Exercise per Week: 5 days    Minutes of Exercise per Session: 90 min  Stress: No Stress Concern Present (11/16/2022)   [employer] of Occupational Health - Occupational Stress Questionnaire    Feeling of Stress : Not at all  Social Connections: Unknown (11/16/2022)   Social Connection and Isolation Panel [NHANES]    Frequency of Communication with Friends and Family: Once a week    Frequency of Social Gatherings with Friends and Family: More than three times a week    Attends Religious Services: Not on Marketing executive or Organizations: Yes    Attends Banker Meetings: Patient declined    Marital Status: Married    Tobacco Counseling Counseling given: Not Answered   Clinical Intake:  Pre-visit preparation completed: Yes  Pain : No/denies pain     Diabetes: No  How often do you need to have someone help you when you read instructions, pamphlets, or other written materials from your doctor or pharmacy?: 1 - Never What is the last grade level you completed in school?: bachelor degree  Interpreter Needed?: No      Activities of Daily Living    11/16/2022   12:27 PM 11/16/2022    6:48 AM  In your present state of health, do you have any difficulty performing the following  activities:  Hearing? 0 0  Vision? 0 0  Difficulty concentrating or making decisions? 0 1  Walking or climbing stairs? 0 0  Dressing or bathing? 0 0  Doing errands, shopping? 0 0  Preparing Food and eating ? N Y  Using the Toilet? N N  In the past six months, have you accidently leaked urine? N N  Do you have problems with loss of bowel control? N N  Managing your Medications? N Y  Managing your Finances? N Y  Housekeeping or managing your Housekeeping? N N    Patient Care Team: Georgina Quint, MD as PCP - General (Internal Medicine) Georgina Quint, MD (Internal Medicine) Janalyn Harder, MD (Inactive) as Consulting Physician (Dermatology) Augustin Schooling, MD as Consulting Physician (Ophthalmology)  Indicate any recent Medical Services you may have received from other than [facility] providers in the past year (date may be approximate).     Assessment:   This is a routine wellness examination for Adam Peters.  Hearing/Vision screen No results found.  Dietary issues and exercise activities discussed:     Goals Addressed             This Visit's Progress    Activity and Exercise Increased       Evidence-based guidance:  Review current exercise levels.  Assess patient perspective on exercise or activity level, barriers to increasing activity, motivation and readiness for change.  Recommend or set healthy exercise goal based on individual tolerance.  Encourage small steps toward making change in amount of exercise or activity.  Urge reduction of sedentary activities or screen time.  Promote group activities within the community or with family or support person.  Consider referral to rehabiliation therapist for assessment and exercise/activity plan.   Notes:  Depression Screen    11/16/2022   12:34 PM 11/16/2022   12:30 PM 09/01/2022    3:25 PM 05/20/2022   10:25 AM 04/13/2022    3:16 PM 04/06/2022   10:58 AM 03/03/2022    2:31 PM  PHQ 2/9 Scores  PHQ - 2  Score 3 3 0 0 0 0 0  PHQ- 9 Score 3 3         Fall Risk    11/16/2022   12:32 PM 11/16/2022    6:48 AM 09/01/2022    3:25 PM 05/20/2022   10:24 AM 04/13/2022    3:16 PM  Fall Risk   Falls in the past year? 1 1 1  0 0  Number falls in past yr: 0 0 0 0 0  Injury with Fall? 1 1 1  0 0  Risk for fall due to : History of fall(s)  Impaired balance/gait No Fall Risks No Fall Risks  Follow up Falls prevention discussed  Falls evaluation completed Falls evaluation completed Falls evaluation completed    MEDICARE RISK AT HOME:  Medicare Risk at Home - 11/16/22 1233     Any stairs in or around the home? Yes    If so, are there any without handrails? No    Home free of loose throw rugs in walkways, pet beds, electrical cords, etc? Yes    Adequate lighting in your home to reduce risk of falls? Yes    Life alert? No    Use of a cane, walker or w/c? No    Grab bars in the bathroom? No    Shower chair or bench in shower? Yes    Elevated toilet seat or a handicapped toilet? No             TIMED UP AND GO:  Was the test performed?  No    Cognitive Function:        11/16/2022   12:34 PM 11/12/2021    9:07 AM 09/15/2019    9:10 AM  6CIT Screen  What Year? 0 points 0 points 0 points  What month? 0 points 0 points 0 points  What time? 0 points 0 points 0 points  Count back from 20 0 points 0 points 0 points  Months in reverse 0 points 0 points 0 points  Repeat phrase 0 points 0 points 0 points  Total Score 0 points 0 points 0 points    Immunizations Immunization History  Administered Date(s) Administered   Fluad Quad(high Dose 65+) 01/31/2019, 01/14/2021, 06/30/2021, 02/24/2022   Influenza Split 03/12/2012, 04/04/2013   Influenza, High Dose Seasonal PF 02/24/2018   Influenza,inj,Quad PF,6+ Mos 03/11/2016, 03/01/2017   Influenza,inj,quad, With Preservative 02/15/2018   Influenza-Unspecified 01/17/2011, 04/04/2013, 02/05/2014, 02/07/2015, 06/20/2019, 02/16/2020, 03/13/2020   Moderna  Covid-19 Vaccine Bivalent Booster 88yrs & up 03/21/2021   Moderna Sars-Covid-2 Vaccination 06/20/2019, 07/17/2019, 08/17/2019, 03/18/2020, 08/29/2020   Pneumococcal Conjugate-13 10/22/2006, 11/29/2014   Pneumococcal Polysaccharide-23 04/04/2013, 06/07/2018, 12/26/2021   Rsv, Bivalent, Protein Subunit Rsvpref,pf Verdis Frederickson) 08/11/2022   Td (Adult) 06/09/2016   Tdap 07/08/2012, 12/07/2016   Unspecified SARS-COV-2 Vaccination 02/24/2022   Zoster Recombinant(Shingrix) 07/03/2020, 11/21/2020    TDAP status: Up to date  Flu Vaccine status: Up to date  Pneumococcal vaccine status: Up to date  Covid-19 vaccine status: Completed vaccines  Qualifies for Shingles Vaccine? Yes   Zostavax completed Yes   Shingrix Completed?: Yes  Screening Tests Health Maintenance  Topic Date Due   COVID-19 Vaccine (8 - 2023-24 season) 04/21/2022  INFLUENZA VACCINE  12/17/2022   Medicare Annual Wellness (AWV)  11/16/2023   Colonoscopy  11/04/2026   DTaP/Tdap/Td (4 - Td or Tdap) 12/08/2026   Pneumonia Vaccine 69+ Years old  Completed   Hepatitis C Screening  Completed   Zoster Vaccines- Shingrix  Completed   HPV VACCINES  Aged Out    Health Maintenance  Health Maintenance Due  Topic Date Due   COVID-19 Vaccine (8 - 2023-24 season) 04/21/2022    Colorectal cancer screening: Type of screening: Colonoscopy. Completed 11/03/2016. Repeat every 10 years  Lung Cancer Screening: (Low Dose CT Chest recommended if Age 48-80 years, 20 pack-year currently smoking OR have quit w/in 15years.) does not qualify.   Lung Cancer Screening Referral: na  Additional Screening:  Hepatitis C Screening: does not qualify; Completed   Vision Screening: Recommended annual ophthalmology exams for early detection of glaucoma and other disorders of the eye. Is the patient up to date with their annual eye exam?  Yes  Who is the provider or what is the name of the office in which the patient attends annual eye exams? [facility] If  pt is not established with a provider, would they like to be referred to a provider to establish care?  Established  .   Dental Screening: Recommended annual dental exams for proper oral hygiene  Diabetic Foot Exam:   Community Resource Referral / Chronic Care Management: CRR required this visit?  No   CCM required this visit?  No     Plan:     I have personally reviewed and noted the following in the patient's chart:   Medical and social history Use of alcohol, tobacco or illicit drugs  Current medications and supplements including opioid prescriptions. Patient is not currently taking opioid prescriptions. Functional ability and status Nutritional status Physical activity Advanced directives List of other physicians Hospitalizations, surgeries, and ER visits in previous 12 months Vitals Screenings to include cognitive, depression, and falls Referrals and appointments  In addition, I have reviewed and discussed with patient certain preventive protocols, quality metrics, and best practice recommendations. A written personalized care plan for preventive services as well as general preventive health recommendations were provided to patient.     Delana Meyer   11/16/2022   After Visit Summary: (MyChart) Due to this being a telephonic visit, the after visit summary with patients personalized plan was offered to patient via MyChart   Nurse Notes: none

## 2022-11-16 NOTE — Patient Instructions (Signed)
Mr. Adam Peters , Thank you for taking time to come for your Medicare Wellness Visit. I appreciate your ongoing commitment to your health goals. Please review the following plan we discussed and let me know if I can assist you in the future.   These are the goals we discussed:  Goals      Activity and Exercise Increased     Evidence-based guidance:  Review current exercise levels.  Assess patient perspective on exercise or activity level, barriers to increasing activity, motivation and readiness for change.  Recommend or set healthy exercise goal based on individual tolerance.  Encourage small steps toward making change in amount of exercise or activity.  Urge reduction of sedentary activities or screen time.  Promote group activities within the community or with family or support person.  Consider referral to rehabiliation therapist for assessment and exercise/activity plan.   Notes:      Increase physical activity     Would like to increase now that he is back to gym      Stay healthy and continue to be physically active in the gym.     Stay strong and healthy.        This is a list of the screening recommended for you and due dates:  Health Maintenance  Topic Date Due   COVID-19 Vaccine (8 - 2023-24 season) 04/21/2022   Flu Shot  12/17/2022   Medicare Annual Wellness Visit  11/16/2023   Colon Cancer Screening  11/04/2026   DTaP/Tdap/Td vaccine (4 - Td or Tdap) 12/08/2026   Pneumonia Vaccine  Completed   Hepatitis C Screening  Completed   Zoster (Shingles) Vaccine  Completed   HPV Vaccine  Aged Out   Health Maintenance, Male Adopting a healthy lifestyle and getting preventive care are important in promoting health and wellness. Ask your health care provider about: The right schedule for you to have regular tests and exams. Things you can do on your own to prevent diseases and keep yourself healthy. What should I know about diet, weight, and exercise? Eat a healthy  diet  Eat a diet that includes plenty of vegetables, fruits, low-fat dairy products, and lean protein. Do not eat a lot of foods that are high in solid fats, added sugars, or sodium. Maintain a healthy weight Body mass index (BMI) is a measurement that can be used to identify possible weight problems. It estimates body fat based on height and weight. Your health care provider can help determine your BMI and help you achieve or maintain a healthy weight. Get regular exercise Get regular exercise. This is one of the most important things you can do for your health. Most adults should: Exercise for at least 150 minutes each week. The exercise should increase your heart rate and make you sweat (moderate-intensity exercise). Do strengthening exercises at least twice a week. This is in addition to the moderate-intensity exercise. Spend less time sitting. Even light physical activity can be beneficial. Watch cholesterol and blood lipids Have your blood tested for lipids and cholesterol at 74 years of age, then have this test every 5 years. You may need to have your cholesterol levels checked more often if: Your lipid or cholesterol levels are high. You are older than 73 years of age. You are at high risk for heart disease. What should I know about cancer screening? Many types of cancers can be detected early and may often be prevented. Depending on your health history and family history, you may need to  have cancer screening at various ages. This may include screening for: Colorectal cancer. Prostate cancer. Skin cancer. Lung cancer. What should I know about heart disease, diabetes, and high blood pressure? Blood pressure and heart disease High blood pressure causes heart disease and increases the risk of stroke. This is more likely to develop in people who have high blood pressure readings or are overweight. Talk with your health care provider about your target blood pressure readings. Have your  blood pressure checked: Every 3-5 years if you are 71-32 years of age. Every year if you are 77 years old or older. If you are between the ages of 28 and 28 and are a current or former smoker, ask your health care provider if you should have a one-time screening for abdominal aortic aneurysm (AAA). Diabetes Have regular diabetes screenings. This checks your fasting blood sugar level. Have the screening done: Once every three years after age 19 if you are at a normal weight and have a low risk for diabetes. More often and at a younger age if you are overweight or have a high risk for diabetes. What should I know about preventing infection? Hepatitis B If you have a higher risk for hepatitis B, you should be screened for this virus. Talk with your health care provider to find out if you are at risk for hepatitis B infection. Hepatitis C Blood testing is recommended for: Everyone born from [birth year] through 1966. Anyone with known risk factors for hepatitis C. Sexually transmitted infections (STIs) You should be screened each year for STIs, including gonorrhea and chlamydia, if: You are sexually active and are younger than 73 years of age. You are older than 73 years of age and your health care provider tells you that you are at risk for this type of infection. Your sexual activity has changed since you were last screened, and you are at increased risk for chlamydia or gonorrhea. Ask your health care provider if you are at risk. Ask your health care provider about whether you are at high risk for HIV. Your health care provider may recommend a prescription medicine to help prevent HIV infection. If you choose to take medicine to prevent HIV, you should first get tested for HIV. You should then be tested every 3 months for as long as you are taking the medicine. Follow these instructions at home: Alcohol use Do not drink alcohol if your health care provider tells you not to drink. If you drink  alcohol: Limit how much you have to 0-2 drinks a day. Know how much alcohol is in your drink. In the [place] one drink equals one 12 oz bottle of beer (355 mL), one 5 oz glass of wine (148 mL), or one 1 oz glass of hard liquor (44 mL). Lifestyle Do not use any products that contain nicotine or tobacco. These products include cigarettes, chewing tobacco, and vaping devices, such as e-cigarettes. If you need help quitting, ask your health care provider. Do not use street drugs. Do not share needles. Ask your health care provider for help if you need support or information about quitting drugs. General instructions Schedule regular health, dental, and eye exams. Stay current with your vaccines. Tell your health care provider if: You often feel depressed. You have ever been abused or do not feel safe at home. Summary Adopting a healthy lifestyle and getting preventive care are important in promoting health and wellness. Follow your health care provider's instructions about healthy diet, exercising, and getting tested  or screened for diseases. Follow your health care provider's instructions on monitoring your cholesterol and blood pressure. This information is not intended to replace advice given to you by your health care provider. Make sure you discuss any questions you have with your health care provider. Document Revised: 09/23/2020 Document Reviewed: 09/23/2020 Elsevier Patient Education  2024 [employer].

## 2022-11-17 ENCOUNTER — Ambulatory Visit: Payer: Medicare Other | Attending: Cardiology | Admitting: Pharmacist Clinician (PhC)/ Clinical Pharmacy Specialist

## 2022-11-17 ENCOUNTER — Encounter: Payer: Self-pay | Admitting: Pharmacist Clinician (PhC)/ Clinical Pharmacy Specialist

## 2022-11-17 DIAGNOSIS — E785 Hyperlipidemia, unspecified: Secondary | ICD-10-CM

## 2022-11-17 NOTE — Progress Notes (Signed)
Patient in office today with wife, for injection teaching.  Was started on Repatha 140 mg, but has been hesitant to take first dose.   Reviewed injection technique with patient and had him use demo pen to get comfortable with the process.  Answered all questions for patient and wife.  Patient successfully gave dose of repatha in abdomen without concern

## 2022-11-20 ENCOUNTER — Ambulatory Visit: Payer: Medicare Other | Admitting: Internal Medicine

## 2022-11-23 NOTE — Patient Instructions (Signed)
1. Moderate persistent asthma, uncomplicated - Daily controller medication(s):  Stilolto two puffs once daily. Reviewed proper technique. Try doing proper technique consistently to see if we get a decrease in symptoms. - Prior to physical activity: albuterol 2 puffs 10-15 minutes before physical activity. - Rescue medications: albuterol 4 puffs every 4-6 hours as needed - Asthma control goals:  * Full participation in all desired activities (may need albuterol before activity) * Albuterol use two time or less a week on average (not counting use with activity) * Cough interfering with sleep two time or less a month * Oral steroids no more than once a year * No hospitalizations  2. Seasonal and perennial allergic rhinitis (grasses, ragweed, trees, indoor molds, outdoor molds, cat and dog) - Continue with: Allegra (fexofenadine) 180mg  tablet once daily and Flonase (fluticasone) two sprays per nostril daily and Astelin (azelastine) 2 sprays per nostril 1-2 times daily. - Continue with salt water rinses before the nose sprays. - Allergy shots might be the best treatment option in the long run since that can decrease mucous production, but we will keep this in our back pocket.   3. Reflux Continue dietary and lifestyle modifications as listed below Continue Prilosec as previously prescribed. Take this medication at least 30 minutes before a meal for best result  Call the clinic if this treatment plan is not working well for you  Follow up in months or sooner if needed.

## 2022-11-24 ENCOUNTER — Other Ambulatory Visit: Payer: Self-pay

## 2022-11-24 ENCOUNTER — Encounter: Payer: Self-pay | Admitting: Family

## 2022-11-24 ENCOUNTER — Ambulatory Visit: Payer: Medicare Other

## 2022-11-24 ENCOUNTER — Ambulatory Visit (INDEPENDENT_AMBULATORY_CARE_PROVIDER_SITE_OTHER): Payer: Medicare Other | Admitting: Family

## 2022-11-24 VITALS — BP 120/68 | HR 73 | Temp 98.4°F | Resp 20

## 2022-11-24 DIAGNOSIS — J4540 Moderate persistent asthma, uncomplicated: Secondary | ICD-10-CM | POA: Diagnosis not present

## 2022-11-24 DIAGNOSIS — J3089 Other allergic rhinitis: Secondary | ICD-10-CM | POA: Diagnosis not present

## 2022-11-24 DIAGNOSIS — J302 Other seasonal allergic rhinitis: Secondary | ICD-10-CM | POA: Diagnosis not present

## 2022-11-24 DIAGNOSIS — K219 Gastro-esophageal reflux disease without esophagitis: Secondary | ICD-10-CM

## 2022-11-24 NOTE — Progress Notes (Signed)
[address] [place] [address] Dept: [phone]  FOLLOW UP NOTE  Patient ID: Adam Peters, male    DOB: 1951/04/06  Age: 72 y.o. MRN: [id number] Date of Office Visit: 11/24/2022  Assessment  Chief Complaint: Follow-up  HPI Adam Peters is a 72 year old male who presents today for follow-up of not well-controlled moderate persistent asthma, productive cough, seasonal and perennial allergic rhinitis, and gastroesophageal reflux disease.  He was last seen on Oct 13, 2022 by myself.  He reports that his lipoprotein is high and was just started on Repatha.  He also reports that he saw his cardiologist and had a stress test since his last office visit and reports that it was unchanged. After reviewing stress test it shows: "Stress test shows no ischemia. 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 LVEF 48%.  No change compared to prior study. "  Moderate persistent asthma: He is currently taking Stiolto 2 puffs once a day.  He was previously on Breztri but had to stop due to voice changes.  He reports occasional dry cough that does not occur daily.  He also reports rare wheezing.  He denies tightness in chest, shortness of breath, and nocturnal awakenings due to breathing problems.  Since his last office visit he has not required any systemic steroids or made any trips to the emergency room or urgent care due to breathing problems.  He has not had to use his albuterol since his last office visit.  Seasonal and perennial allergic rhinitis is reported as controlled with Allegra 180 mg once a day, Flonase daily, and azelastine nasal spray twice a day.  He denies rhinorrhea, nasal congestion, and postnasal drip.  He has not been treated for any sinus infections since we last saw him.  Reflux is reported as controlled with Prilosec.  He denies any heartburn or reflux symptoms.   Drug Allergies:  Allergies  Allergen Reactions   Amoxicillin Diarrhea    GI upset.    Review of  Systems: Review of Systems  Constitutional:  Negative for chills and fever.  HENT:         Denies rhinorrhea, nasal congestion, and postnasal drip  Eyes:        Denies itchy watery eyes  Respiratory:  Positive for cough and wheezing. Negative for shortness of breath.        Reports occasional dry cough that does not occur daily.  Also reports rare wheezing.  Denies tightness in chest, shortness of breath, and nocturnal awakenings due to breathing problems  Cardiovascular:  Negative for chest pain and palpitations.  Gastrointestinal:        Denies heartburn or reflux symptoms  Skin:  Negative for itching and rash.  Neurological:  Negative for headaches.  Endo/Heme/Allergies:  Positive for environmental allergies.     Physical Exam: BP 120/68   Pulse 73   Temp 98.4 F (36.9 C) (Temporal)   Resp 20   SpO2 98%    Physical Exam Constitutional:      Appearance: Normal appearance.  HENT:     Head: Normocephalic and atraumatic.     Comments: Pharynx normal, eyes normal, ears normal, nose: Bilateral lower turbinates mildly edematous with no drainage noted    Right Ear: Tympanic membrane, ear canal and external ear normal.     Left Ear: Tympanic membrane, ear canal and external ear normal.     Mouth/Throat:     Mouth: Mucous membranes are moist.     Pharynx: Oropharynx  is clear.  Eyes:     Conjunctiva/sclera: Conjunctivae normal.  Cardiovascular:     Rate and Rhythm: Regular rhythm.     Heart sounds: Normal heart sounds.  Pulmonary:     Effort: Pulmonary effort is normal.     Breath sounds: Normal breath sounds.     Comments: Lungs clear to auscultation Musculoskeletal:     Cervical back: Neck supple.  Skin:    General: Skin is warm.  Neurological:     Mental Status: He is alert and oriented to person, place, and time.  Psychiatric:        Mood and Affect: Mood normal.        Behavior: Behavior normal.        Thought Content: Thought content normal.        Judgment:  Judgment normal.     Diagnostics:  None. Will get at next office visit  Assessment and Plan: 1. Moderate persistent asthma, uncomplicated   2. Seasonal and perennial allergic rhinitis   3. Gastroesophageal reflux disease, unspecified whether esophagitis present     No orders of the defined types were placed in this encounter.   Patient Instructions  1. Moderate persistent asthma, uncomplicated - Daily controller medication(s):  Stilolto two puffs once daily. Reviewed proper technique. Try doing proper technique consistently to see if we get a decrease in symptoms. - Prior to physical activity: albuterol 2 puffs 10-15 minutes before physical activity. - Rescue medications: albuterol 4 puffs every 4-6 hours as needed - Asthma control goals:  * Full participation in all desired activities (may need albuterol before activity) * Albuterol use two time or less a week on average (not counting use with activity) * Cough interfering with sleep two time or less a month * Oral steroids no more than once a year * No hospitalizations  2. Seasonal and perennial allergic rhinitis (grasses, ragweed, trees, indoor molds, outdoor molds, cat and dog) - Continue with: Allegra (fexofenadine) 180mg  tablet once daily and Flonase (fluticasone) two sprays per nostril daily and Astelin (azelastine) 2 sprays per nostril 1-2 times daily. - Continue with salt water rinses before the nose sprays. - Allergy shots might be the best treatment option in the long run since that can decrease mucous production, but we will keep this in our back pocket.   3. Reflux Continue dietary and lifestyle modifications as listed below Continue Prilosec as previously prescribed. Take this medication at least 30 minutes before a meal for best result  Call the clinic if this treatment plan is not working well for you  Follow up in 3 months or sooner if needed.     Return in about 3 months (around 02/24/2023), or if symptoms  worsen or fail to improve.    Thank you for the opportunity to care for this patient.  Please do not hesitate to contact me with questions.  Nehemiah Settle, FNP [facility]

## 2022-11-30 ENCOUNTER — Ambulatory Visit: Payer: Medicare Other | Attending: Physician Assistant | Admitting: Physician Assistant

## 2022-11-30 ENCOUNTER — Encounter: Payer: Self-pay | Admitting: Physician Assistant

## 2022-11-30 VITALS — BP 122/82 | HR 70 | Ht 74.0 in | Wt 226.2 lb

## 2022-11-30 DIAGNOSIS — I10 Essential (primary) hypertension: Secondary | ICD-10-CM | POA: Insufficient documentation

## 2022-11-30 DIAGNOSIS — E785 Hyperlipidemia, unspecified: Secondary | ICD-10-CM | POA: Insufficient documentation

## 2022-11-30 DIAGNOSIS — I2510 Atherosclerotic heart disease of native coronary artery without angina pectoris: Secondary | ICD-10-CM | POA: Insufficient documentation

## 2022-11-30 NOTE — Progress Notes (Unsigned)
  Cardiology Office Note:  .   Date:  11/30/2022  ID:  Adam Peters, DOB 1950-12-15, MRN [id number] PCP: Georgina Quint, MD   [facility] Providers Cardiologist:  [name], MD { Click to update primary MD,subspecialty MD or APP then REFRESH:1}   History of Present Illness: Adam Kitchen   Adam Peters is a 72 y.o. male with past medical history of CAD, asthma, hypertension, hyperlipidemia, PAD, cured hepatitis C with Harvoni and a history of schizophrenia.  Patient had PCI of LAD in 1997.  He later had DES to mid RCA due to acute RCA occlusion.  He recently underwent peripheral angiography by Dr. Loralie Champagne for claudication symptom.  Postprocedure, his claudication symptom has significantly improved.  He was seen by Dr. Rennis Golden on 10/22/2022 at which time he complained of chest discomfort and shortness of breath with exertion.  A Myoview was ordered which showed 2 mm horizontal ST depression in the inferolateral leads, there was no evidence of ischemia, however there was evidence of infarction with small defect of mild reduction in uptake present in the apical inferior location that is fixed, EF 48%.  His lipoprotein a was elevated at 187.3.  Lipid panel showed LDL at 73.  LDL goal less than 55.  Repatha was added to his medical regimen.  Patient presents today for follow-up.  He has not had any further chest discomfort.  He has no lower extremity edema, orthopnea or PND.  Overall, he has been doing well without further issue.  He is tolerating the Repatha.  He will need repeat fasting blood work in 3 months.  He can follow-up with Dr. Rennis Golden in 1 year.   ROS: ***  Studies Reviewed: .        *** Risk Assessment/Calculations:   {Does this patient have ATRIAL FIBRILLATION?:[phone]}         Physical Exam:   VS:  BP 122/82   Pulse 70   Ht 6\' 2"  (1.88 m)   Wt 226 lb 3.2 oz (102.6 kg)   SpO2 98%   BMI 29.04 kg/m    Wt Readings from Last 3 Encounters:  11/30/22 226 lb 3.2  oz (102.6 kg)  10/27/22 233 lb (105.7 kg)  10/22/22 233 lb (105.7 kg)    GEN: Well nourished, well developed in no acute distress NECK: No JVD; No carotid bruits CARDIAC: ***RRR, no murmurs, rubs, gallops RESPIRATORY:  Clear to auscultation without rales, wheezing or rhonchi  ABDOMEN: Soft, non-tender, non-distended EXTREMITIES:  No edema; No deformity   ASSESSMENT AND PLAN: .   ***    {Are you ordering a CV Procedure (e.g. stress test, cath, DCCV, TEE, etc)?   Press F2        :[id number]}  Dispo: ***  Signed, Azalee Course, PA

## 2022-11-30 NOTE — Patient Instructions (Signed)
Medication Instructions:  Your physician recommends that you continue on your current medications as directed. Please refer to the Current Medication list given to you today.  *If you need a refill on your cardiac medications before your next appointment, please call your pharmacy*   Lab Work: Return for fastin labs in 3-4 months If you have labs (blood work) drawn today and your tests are completely normal, you will receive your results only by: MyChart Message (if you have MyChart) OR A paper copy in the mail If you have any lab test that is abnormal or we need to change your treatment, we will call you to review the results.   Testing/Procedures: none   Follow-Up: At [facility], you and your health needs are our priority.  As part of our continuing mission to provide you with exceptional heart care, we have created designated Provider Care Teams.  These Care Teams include your primary Cardiologist (physician) and Advanced Practice Providers (APPs -  Physician Assistants and Nurse Practitioners) who all work together to provide you with the care you need, when you need it.  We recommend signing up for the patient portal called "MyChart".  Sign up information is provided on this After Visit Summary.  MyChart is used to connect with patients for Virtual Visits (Telemedicine).  Patients are able to view lab/test results, encounter notes, upcoming appointments, etc.  Non-urgent messages can be sent to your provider as well.   To learn more about what you can do with MyChart, go to [email or web address].    Your next appointment:   1 year(s)  Provider:   [name], MD

## 2022-12-04 NOTE — Progress Notes (Signed)
Order(s) created erroneously. Erroneous order ID: [id number]  Order moved by: Ian Malkin  Order move date/time: 12/04/2022 1:55 PM  Source Patient: [id number]  Source Contact: 11/30/2022  Destination Patient: [id number]  Destination Contact: 10/28/2022

## 2022-12-28 ENCOUNTER — Telehealth: Payer: Self-pay | Admitting: Allergy & Immunology

## 2022-12-28 NOTE — Telephone Encounter (Signed)
Patient states he needs a prescription refill due to his supply at [facility] is saying he only has a 30 day supply and he needs a 90 day supply. Please call back @ [phone].

## 2022-12-29 ENCOUNTER — Other Ambulatory Visit: Payer: Self-pay

## 2022-12-29 MED ORDER — FEXOFENADINE HCL 180 MG PO TABS: 180.0000 mg | ORAL_TABLET | Freq: Every day | ORAL | 1 refills | Status: DC | PRN

## 2022-12-29 NOTE — Telephone Encounter (Signed)
I called patient to get more information on which medication it is that he needs a 90 day supply. I left a message to call the office back.

## 2022-12-29 NOTE — Telephone Encounter (Signed)
Pt called back and confirmed allegra is what he needed [facility] to [facility] so sent in rx

## 2023-01-12 ENCOUNTER — Other Ambulatory Visit: Payer: Self-pay | Admitting: Family

## 2023-02-17 ENCOUNTER — Other Ambulatory Visit: Payer: Self-pay | Admitting: *Deleted

## 2023-02-17 DIAGNOSIS — I70213 Atherosclerosis of native arteries of extremities with intermittent claudication, bilateral legs: Secondary | ICD-10-CM

## 2023-02-19 DIAGNOSIS — I10 Essential (primary) hypertension: Secondary | ICD-10-CM | POA: Diagnosis not present

## 2023-02-19 DIAGNOSIS — H40013 Open angle with borderline findings, low risk, bilateral: Secondary | ICD-10-CM | POA: Diagnosis not present

## 2023-02-19 DIAGNOSIS — H2513 Age-related nuclear cataract, bilateral: Secondary | ICD-10-CM | POA: Diagnosis not present

## 2023-02-25 ENCOUNTER — Other Ambulatory Visit: Payer: Self-pay | Admitting: Emergency Medicine

## 2023-02-25 DIAGNOSIS — I10 Essential (primary) hypertension: Secondary | ICD-10-CM

## 2023-02-26 DIAGNOSIS — E785 Hyperlipidemia, unspecified: Secondary | ICD-10-CM | POA: Diagnosis not present

## 2023-02-26 DIAGNOSIS — I739 Peripheral vascular disease, unspecified: Secondary | ICD-10-CM | POA: Diagnosis not present

## 2023-02-26 DIAGNOSIS — E7841 Elevated Lipoprotein(a): Secondary | ICD-10-CM | POA: Diagnosis not present

## 2023-02-26 DIAGNOSIS — I2510 Atherosclerotic heart disease of native coronary artery without angina pectoris: Secondary | ICD-10-CM | POA: Diagnosis not present

## 2023-02-28 LAB — LIPOPROTEIN A (LPA): Lipoprotein (a): 120.3 nmol/L — ABNORMAL HIGH (ref <75.0)

## 2023-02-28 LAB — NMR, LIPOPROFILE
Cholesterol, Total: 82 mg/dL — ABNORMAL LOW (ref 100–199)
HDL Particle Number: 25.1 umol/L — ABNORMAL LOW (ref >=30.5)
HDL-C: 49 mg/dL (ref >39)
LDL Particle Number: <300 nmol/L (ref <1000)
LDL-C (NIH Calc): 20 mg/dL (ref 0–99)
LP-IR Score: <25 (ref <=45)
Small LDL Particle Number: <90 nmol/L (ref <=527)
Triglycerides: 54 mg/dL (ref 0–149)

## 2023-03-03 ENCOUNTER — Ambulatory Visit (HOSPITAL_COMMUNITY)
Admission: RE | Admit: 2023-03-03 | Discharge: 2023-03-03 | Disposition: A | Discharge status: Home / Self Care | Payer: Medicare Other | Source: Ambulatory Visit | Attending: Vascular Surgery | Admitting: Vascular Surgery

## 2023-03-03 ENCOUNTER — Ambulatory Visit (INDEPENDENT_AMBULATORY_CARE_PROVIDER_SITE_OTHER): Payer: Medicare Other | Admitting: Physician Assistant

## 2023-03-03 VITALS — BP 149/89 | HR 58 | Temp 97.4°F | Resp 20 | Ht 74.0 in | Wt 223.9 lb

## 2023-03-03 DIAGNOSIS — I70213 Atherosclerosis of native arteries of extremities with intermittent claudication, bilateral legs: Secondary | ICD-10-CM

## 2023-03-03 NOTE — Progress Notes (Signed)
HISTORY AND PHYSICAL     CC:  follow up. Requesting Provider:  Georgina Quint, *  HPI: This is a 72 y.o. male who is here today for follow up for PAD.  Pt has hx of claudication.    Pt was last seen 03/04/2022 and at that time, he was having claudication at 0.1 mile on the track prior to cramping.  He was encouraged to continue walking regimen, asa, statin and f/u in one year with ABI or sooner if needed.   He has hx of cardiac stenting, HLD, HTN.  He is a former smoker.   The pt returns today for follow up.  He states he is doing very well.  In February at the [facility] in [place], he states he had stents placed in both legs and has his cards for this.  He is able to walk without any difficulty now.  He states they have not said whether or not they will follow the stents.  He is taking asa and statin.  He has stopped his plavix.    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  He is excited about [employer] homecoming this weekend.   The pt is on a statin for cholesterol management.    The pt is on an aspirin.    Other AC:  plavix The pt is on ARB, CCB for hypertension.  The pt is not on medication for diabetes. Tobacco hx:  former-quit 1995  Pt does not have family hx of AAA.  Past Medical History:  Diagnosis Date   Allergy    seasonal   Anemia    Anxiety    Asthma    CAD (coronary artery disease)    a. 1997 PCI/BMS to distal LAD (J&J PS BMS);  b. 03/2011 Neg MV;  c. 07/2013 NSTEMI/PCI: LM 20d, LAD 63m, 50d isr, LCX nl, OM1/2 nl, RCA dom 95-30m (3.5x16 Promus premier DES), EF 55%, mild basal inf HK.   Cataract    Coronary artery disease    s/p PCI to LAD (1997) - Dr. Langston Reusing   GERD (gastroesophageal reflux disease)    Hepatitis C    History of tobacco use    Hyperlipidemia    Hypertension    Schizophrenia (HCC) 1978    Past Surgical History:  Procedure Laterality Date   APPENDECTOMY  1965   CARDIAC  CATHETERIZATION  2001   patent LAD stent (Dr Hiram Gash)   CORONARY ANGIOPLASTY WITH STENT PLACEMENT  06/09/1995   prox LAD stenting 80% to 0% (Dr. Langston Reusing) - repeat cath in 11/1999 showed no significant coronary disease & normal systolic function (Dr. Laurell Josephs)   CORONARY STENT PLACEMENT  08/02/2013   RCA   DES         DR COOPER   CORONARY STENT PLACEMENT  07/16/2022   HERNIA REPAIR  1994   knife wound repair  1969   LEFT HEART CATHETERIZATION WITH CORONARY ANGIOGRAM N/A 08/02/2013   Procedure: LEFT HEART CATHETERIZATION WITH CORONARY ANGIOGRAM;  Surgeon: Micheline Chapman, MD;  [facility] CATH LAB;  Service: Cardiovascular;  Laterality: N/A;   TRANSTHORACIC ECHOCARDIOGRAM  11/13/2008   EF 50-55%, normal LV systolic function; RA mildly dilated; trace MR/TR/PR    Allergies  Allergen Reactions   Amoxicillin Diarrhea    GI upset.    Current Outpatient Medications  Medication Sig Dispense Refill   amLODipine (NORVASC) 5 MG tablet Take 5 mg by  mouth daily.     aspirin EC 81 MG tablet Take 81 mg by mouth daily.     atorvastatin (LIPITOR) 40 MG tablet TAKE 1 TABLET DAILY 90 tablet 3   azelastine (ASTELIN) 0.1 % nasal spray Use 1 to 2 sprays in each nostril 1-2 times a day as needed for runny nose/drainage down throat 90 mL 1   Cholecalciferol (VITAMIN D) 50 MCG (2000 UT) tablet Take by mouth.     clopidogrel (PLAVIX) 75 MG tablet Take 1 tablet by mouth daily.     escitalopram (LEXAPRO) 20 MG tablet Take 25 mg by mouth daily.     Evolocumab (REPATHA SURECLICK) 140 MG/ML SOAJ Inject 140 mg into the skin every 14 (fourteen) days. 6 mL 3   fexofenadine (ALLEGRA) 180 MG tablet Take 1 tablet (180 mg total) by mouth daily as needed for allergies or rhinitis (Can take an extra dose during flare ups.). 90 tablet 1   fluticasone (FLONASE) 50 MCG/ACT nasal spray Use 2 sprays in each nostril once a day as needed for stuffy nose 48 g 1   nitroGLYCERIN (NITROSTAT) 0.4 MG SL tablet DISSOLVE 1 TABLET UNDER  THE TONGUE EVERY 5 MINUTES AS NEEDED FOR CHEST PAIN (SCHEDULE AN APPOINTMENT FOR FUTURE REFILLS) 25 tablet 11   olmesartan (BENICAR) 20 MG tablet TAKE 1 TABLET DAILY 90 tablet 3   omeprazole (PRILOSEC) 20 MG capsule TAKE 2 CAPSULES TWICE A DAY BEFORE MEALS 360 capsule 3   silodosin (RAPAFLO) 8 MG CAPS capsule Take 8 mg by mouth daily.     STIOLTO RESPIMAT 2.5-2.5 MCG/ACT AERS USE 2 INHALATIONS DAILY 4 g 5   Vibegron (GEMTESA) 75 MG TABS      No current facility-administered medications for this visit.    Family History  Problem Relation Age of Onset   Asthma Mother    Crohn's disease Mother    Heart disease Mother    Hepatitis C Brother    Testicular cancer Brother    Dementia Father    Diabetes Maternal Grandmother    Emphysema Maternal Aunt    Cancer Maternal Aunt    Colon cancer Neg Hx     Social History   Socioeconomic History   Marital status: Married    Spouse name: Not on file   Number of children: Not on file   Years of education: B.A.   Highest education level: Not on file  Occupational History   Occupation: retired    Associate Professor: [employer]  Tobacco Use   Smoking status: Former    Current packs/day: 0.00    Types: Cigarettes    Quit date: 05/18/1993    Years since quitting: 29.8   Smokeless tobacco: Never  Vaping Use   Vaping status: Never Used  Substance and Sexual Activity   Alcohol use: Yes    Alcohol/week: 3.0 standard drinks of alcohol    Types: 3 Glasses of wine per week    Comment: SOCIAL   Drug use: No   Sexual activity: Not Currently  Other Topics Concern   Not on file  Social History Narrative   Cafffeine" occasional.  Education 4 yr college.  WorK: retired.     Social Determinants of Health   Financial Resource Strain: Patient Declined (11/16/2022)   Overall Financial Resource Strain (CARDIA)    Difficulty of Paying Living Expenses: Patient declined  Food Insecurity: Food Insecurity Present (11/16/2022)   Hunger Vital Sign    Worried  About Programme researcher, broadcasting/film/video in  the Last Year: Sometimes true    Ran Out of Food in the Last Year: Sometimes true  Transportation Needs: No Transportation Needs (11/16/2022)   PRAPARE - Administrator, Civil Service (Medical): No    Lack of Transportation (Non-Medical): No  Physical Activity: Sufficiently Active (11/16/2022)   Exercise Vital Sign    Days of Exercise per Week: 5 days    Minutes of Exercise per Session: 90 min  Stress: No Stress Concern Present (11/16/2022)   [employer] of Occupational Health - Occupational Stress Questionnaire    Feeling of Stress : Not at all  Social Connections: Unknown (11/16/2022)   Social Connection and Isolation Panel [NHANES]    Frequency of Communication with Friends and Family: Once a week    Frequency of Social Gatherings with Friends and Family: More than three times a week    Attends Religious Services: Not on file    Active Member of Clubs or Organizations: Yes    Attends Banker Meetings: Patient declined    Marital Status: Married  Catering manager Violence: Not At Risk (11/16/2022)   Humiliation, Afraid, Rape, and Kick questionnaire    Fear of Current or Ex-Partner: No    Emotionally Abused: No    Physically Abused: No    Sexually Abused: No     REVIEW OF SYSTEMS:   [X]  denotes positive finding, [ ]  denotes negative finding Cardiac  Comments:  Chest pain or chest pressure:    Shortness of breath upon exertion:    Short of breath when lying flat:    Irregular heart rhythm:        Vascular    Pain in calf, thigh, or hip brought on by ambulation:    Pain in feet at night that wakes you up from your sleep:     Blood clot in your veins:    Leg swelling:         Pulmonary    Oxygen at home:    Productive cough:     Wheezing:         Neurologic    Sudden weakness in arms or legs:     Sudden numbness in arms or legs:     Sudden onset of difficulty speaking or slurred speech:    Temporary loss of vision in  one eye:     Problems with dizziness:         Gastrointestinal    Blood in stool:     Vomited blood:         Genitourinary    Burning when urinating:     Blood in urine:        Psychiatric    Major depression:         Hematologic    Bleeding problems:    Problems with blood clotting too easily:        Skin    Rashes or ulcers:        Constitutional    Fever or chills:      PHYSICAL EXAMINATION:  Today's Vitals   03/03/23 1416 03/03/23 1419  BP: (!) 149/89   Pulse: (!) 58   Resp: 20   Temp: (!) 97.4 F (36.3 C)   SpO2: 97%   Weight: 223 lb 14.4 oz (101.6 kg)   Height: 6\' 2"  (1.88 m)   PainSc: 0-No pain 0-No pain   Body mass index is 28.75 kg/m.   General:  WDWN in NAD; vital signs documented above Gait:  Not observed HENT: WNL, normocephalic Pulmonary: normal non-labored breathing , without wheezing Cardiac: regular HR, without carotid bruits Abdomen: soft, NT; aortic pulse is not palpable Skin: without rashes Vascular Exam/Pulses:  Right Left  Radial 2+ (normal) 2+ (normal)  DP 2+ (normal) 2+ (normal)   Extremities: without ischemic changes, without Gangrene , without cellulitis; without open wounds Musculoskeletal: no muscle wasting or atrophy  Neurologic: A&O X 3 Psychiatric:  The pt has Normal affect.   Non-Invasive Vascular Imaging:   ABI's/TBI's on 03/03/2023: Right:  1.06/0.91 - Great toe pressure: 151 Left:  0.95/0.92 - Great toe pressure: 153     ASSESSMENT/PLAN:: 72 y.o. male here for follow up for PAD with hx of claudication    -since his last visit, he underwent stent placement of BLE as above.  He has palpable DP pulses bilaterally and currently without claudication, rest pain or non healing wounds.   -he states there was no mention of following his stents at the [facility] so we will have him return in 6 months for BLE arterial duplex and ABI.   -continue asa/statin.  He has stopped his Plavix.   -encouraged him to continue walking and  staying active.  -pt will f/u in 6 months with ABI and arterial duplex bilaterally.  He will call sooner if he has any issues before then.   Doreatha Massed, [facility] [phone]  Clinic MD:   Randie Heinz

## 2023-03-04 LAB — VAS US ABI WITH/WO TBI
Left ABI: 0.95
Right ABI: 1.06

## 2023-03-10 ENCOUNTER — Encounter: Payer: Self-pay | Admitting: Internal Medicine

## 2023-03-26 ENCOUNTER — Other Ambulatory Visit: Payer: Self-pay

## 2023-03-26 DIAGNOSIS — I70213 Atherosclerosis of native arteries of extremities with intermittent claudication, bilateral legs: Secondary | ICD-10-CM

## 2023-04-28 ENCOUNTER — Other Ambulatory Visit: Payer: Self-pay | Admitting: Internal Medicine

## 2023-05-18 ENCOUNTER — Encounter: Payer: Self-pay | Admitting: Psychology

## 2023-05-18 ENCOUNTER — Encounter: Payer: Medicare Other | Attending: Psychology | Admitting: Psychology

## 2023-05-18 DIAGNOSIS — G4733 Obstructive sleep apnea (adult) (pediatric): Secondary | ICD-10-CM | POA: Diagnosis not present

## 2023-05-18 DIAGNOSIS — R413 Other amnesia: Secondary | ICD-10-CM | POA: Diagnosis not present
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
## 2023-06-01 ENCOUNTER — Encounter: Payer: Medicare Other | Attending: Psychology

## 2023-06-01 DIAGNOSIS — R413 Other amnesia: Secondary | ICD-10-CM | POA: Insufficient documentation

## 2023-06-01 DIAGNOSIS — G4733 Obstructive sleep apnea (adult) (pediatric): Secondary | ICD-10-CM | POA: Insufficient documentation

## 2023-06-01 DIAGNOSIS — I10 Essential (primary) hypertension: Secondary | ICD-10-CM | POA: Insufficient documentation

## 2023-06-01 NOTE — Progress Notes (Signed)
 Behavioral Observations:  The patient appeared well-groomed and appropriately dressed. His manners were polite and appropriate to the situation. The patient's attitude towards testing was positive and his effort was good. The patient especially struggled with the [employer] of the WAIS-IV.  Neuropsychology Note  Adam Peters completed 110 minutes of neuropsychological testing with technician, Josue Ned, BA, under the supervision of Norleen Asa, PsyD., Clinical Neuropsychologist. The patient did not appear overtly distressed by the testing session, per behavioral observation or via self-report to the technician. Rest breaks were offered.   Clinical Decision Making: In considering the patient's current level of functioning, level of presumed impairment, nature of symptoms, emotional and behavioral responses during clinical interview, level of literacy, and observed level of motivation/effort, a battery of tests was selected by Dr. Asa during initial consultation on 08/24/2022. This was communicated to the technician. Communication between the neuropsychologist and technician was ongoing throughout the testing session and changes were made as deemed necessary based on patient performance on testing, technician observations and additional pertinent factors such as those listed above.  Tests Administered: Controlled Oral Word Association Test (COWAT; FAS & Animals)  Wechsler Adult Intelligence Scale, 4th Edition (WAIS-IV) Wechsler Memory Scale, 4th Edition (WMS-IV); Older Adult Battery   Results:  COWAT: FAS total=25 Z= -0.77 Animals total= 14 Z=-0.53  WAIS-IV:   Composite Score Summary  Scale Sum of Scaled Scores Composite Score Percentile Rank 95% Conf. Interval Qualitative Description  Verbal Comprehension 25 VCI 91 27 86-97 Average  Perceptual Reasoning 18 PRI 77 6 72-84 Borderline  Working Memory 16 WMI 89 23 83-96 Low Average  Processing Speed  11 PSI 76 5 70-87 Borderline  Full Scale 70 FSIQ 79 8 75-83 Borderline  General Ability 43 GAI 81 10 77-87 Low Average  Confidence Intervals are based on the Overall Average SEMs.  Verbal Comprehension Subtests Summary  Subtest Raw Score Scaled Score Percentile Rank Reference Group Scaled Score SEM  Similarities 20 8 25 7  0.95  Vocabulary 29 8 25 8  0.67  Information 13 9 37 10 0.73  The scaled scores in the Reference Group Scaled Score column are based on the performance of examinees aged 20:0-34:11 (i.e., the reference group). See Chapter 6 of the WAIS-IV Technical and Interpretive Manual for more information.  Perceptual Reasoning Subtests Summary  Subtest Raw Score Scaled Score Percentile Rank Reference Group Scaled Score SEM  Block Design 4 2 0.4 1 0.99  Matrix Reasoning 11 9 37 6 0.90  Visual Puzzles 7 7 16 5  0.99   Working Librarian, Academic Raw Score Scaled Score Percentile Rank Reference Group Scaled Score SEM  Digit Span 23 9 37 7 0.73  Arithmetic 10 7 16 7  1.20   Processing Speed Subtests Summary  Subtest Raw Score Scaled Score Percentile Rank Reference Group Scaled Score SEM  Symbol Search 17 7 16 4  1.12  Coding 22 4 2 1  1.12    WMS-IV:  Index Score Summary  Index Sum of Scaled Scores Index Score Percentile Rank 95% Confidence Interval Qualitative Descriptor  Auditory Memory (AMI) 37 95 37 89-101 Average  Visual Memory (VMI) 11 76 5 72-82 Borderline  Immediate Memory (IMI) 22 83 13 78-90 Low Average  Delayed Memory (DMI) 26 91 27 84-99 Average      Primary Subtest Scaled Score Summary  Subtest Domain Raw Score Scaled Score Percentile Rank  Logical Memory I AM 12 3 1   Logical Memory II AM 10 7 16   Verbal  Paired Associates I AM 28 13 84  Verbal Paired Associates II AM 9 14 91  Visual Reproduction I VM 21 6 9   Visual Reproduction II VM 5 5 5   Symbol Span VWM 9 6 9     Auditory Memory Process Score Summary  Process Score Raw Score Scaled  Score Percentile Rank Cumulative Percentage (Base Rate)  LM II Recognition 16 - - 17-25%  VPA II Recognition 29 - - >75%   Visual Memory Process Score Summary  Process Score Raw Score Scaled Score Percentile Rank Cumulative Percentage (Base Rate)  VR II Recognition 3 - - 17-25%   ABILITY-MEMORY ANALYSIS  Ability Score:  VCI: 91 Date of Testing:  WAIS-IV; WMS-IV 2023/06/01  Predicted Difference Method   Index Predicted WMS-IV Index Score Actual WMS-IV Index Score Difference Critical Value  Significant Difference Y/N Base Rate  Auditory Memory 95 95 0 10.41 N   Visual Memory 96 76 20 7.35 Y 5-10%  Immediate Memory 95 83 12 9.69 Y 15-20%  Delayed Memory 95 91 4 12.22 N   Statistical significance (critical value) at the .01 level.    Feedback to Patient: Adam Peters will return on 12/29/2023 for an interactive feedback session with Dr. Corina at which time his test performances, clinical impressions and treatment recommendations will be reviewed in detail. The patient understands he can contact our office should he require our assistance before this time.  110 minutes spent face-to-face with patient administering standardized tests, 30 minutes spent scoring radiographer, therapeutic). [CPT [id number], [id number]]  Full report to follow.

## 2023-06-11 ENCOUNTER — Emergency Department (HOSPITAL_BASED_OUTPATIENT_CLINIC_OR_DEPARTMENT_OTHER)
Admission: EM | Admit: 2023-06-11 | Discharge: 2023-06-11 | Disposition: A | Discharge status: Home / Self Care | Payer: Medicare Other | Attending: Emergency Medicine | Admitting: Emergency Medicine

## 2023-06-11 ENCOUNTER — Encounter (HOSPITAL_BASED_OUTPATIENT_CLINIC_OR_DEPARTMENT_OTHER): Payer: Self-pay

## 2023-06-11 ENCOUNTER — Ambulatory Visit: Payer: Self-pay | Admitting: Emergency Medicine

## 2023-06-11 ENCOUNTER — Other Ambulatory Visit: Payer: Self-pay

## 2023-06-11 DIAGNOSIS — Z79899 Other long term (current) drug therapy: Secondary | ICD-10-CM | POA: Diagnosis not present

## 2023-06-11 DIAGNOSIS — Z20822 Contact with and (suspected) exposure to covid-19: Secondary | ICD-10-CM | POA: Diagnosis not present

## 2023-06-11 DIAGNOSIS — R001 Bradycardia, unspecified: Secondary | ICD-10-CM | POA: Insufficient documentation

## 2023-06-11 DIAGNOSIS — R197 Diarrhea, unspecified: Secondary | ICD-10-CM | POA: Insufficient documentation

## 2023-06-11 DIAGNOSIS — Z7982 Long term (current) use of aspirin: Secondary | ICD-10-CM | POA: Insufficient documentation

## 2023-06-11 DIAGNOSIS — I444 Left anterior fascicular block: Secondary | ICD-10-CM | POA: Insufficient documentation

## 2023-06-11 DIAGNOSIS — Z7901 Long term (current) use of anticoagulants: Secondary | ICD-10-CM | POA: Insufficient documentation

## 2023-06-11 DIAGNOSIS — R42 Dizziness and giddiness: Secondary | ICD-10-CM | POA: Diagnosis not present

## 2023-06-11 LAB — COMPREHENSIVE METABOLIC PANEL
ALT: 24 U/L (ref 0–44)
AST: 26 U/L (ref 15–41)
Albumin: 3.8 g/dL (ref 3.5–5.0)
Alkaline Phosphatase: 81 U/L (ref 38–126)
Anion gap: 6 (ref 5–15)
BUN: 14 mg/dL (ref 8–23)
CO2: 25 mmol/L (ref 22–32)
Calcium: 9.3 mg/dL (ref 8.9–10.3)
Chloride: 105 mmol/L (ref 98–111)
Creatinine, Ser: 1.34 mg/dL — ABNORMAL HIGH (ref 0.61–1.24)
GFR, Estimated: 56 mL/min — ABNORMAL LOW (ref >60)
Glucose, Bld: 105 mg/dL — ABNORMAL HIGH (ref 70–99)
Potassium: 4.4 mmol/L (ref 3.5–5.1)
Sodium: 136 mmol/L (ref 135–145)
Total Bilirubin: 0.6 mg/dL (ref 0.0–1.2)
Total Protein: 7.4 g/dL (ref 6.5–8.1)

## 2023-06-11 LAB — URINALYSIS, ROUTINE W REFLEX MICROSCOPIC
Bilirubin Urine: NEGATIVE
Glucose, UA: NEGATIVE mg/dL
Hgb urine dipstick: NEGATIVE
Ketones, ur: NEGATIVE mg/dL
Leukocytes,Ua: NEGATIVE
Nitrite: NEGATIVE
Protein, ur: NEGATIVE mg/dL
Specific Gravity, Urine: 1.01 (ref 1.005–1.030)
pH: 5.5 (ref 5.0–8.0)

## 2023-06-11 LAB — RESP PANEL BY RT-PCR (RSV, FLU A&B, COVID)  RVPGX2
Influenza A by PCR: NEGATIVE
Influenza B by PCR: NEGATIVE
Resp Syncytial Virus by PCR: NEGATIVE
SARS Coronavirus 2 by RT PCR: NEGATIVE

## 2023-06-11 LAB — CBC
HCT: 39.7 % (ref 39.0–52.0)
Hemoglobin: 12.8 g/dL — ABNORMAL LOW (ref 13.0–17.0)
MCH: 30.2 pg (ref 26.0–34.0)
MCHC: 32.2 g/dL (ref 30.0–36.0)
MCV: 93.6 fL (ref 80.0–100.0)
Platelets: 157 10*3/uL (ref 150–400)
RBC: 4.24 MIL/uL (ref 4.22–5.81)
RDW: 12.1 % (ref 11.5–15.5)
WBC: 6.4 10*3/uL (ref 4.0–10.5)
nRBC: 0 % (ref 0.0–0.2)

## 2023-06-11 LAB — LIPASE, BLOOD: Lipase: 24 U/L (ref 11–51)

## 2023-06-11 NOTE — ED Notes (Signed)
Pt ambulated to bathroom unassisted.

## 2023-06-11 NOTE — ED Notes (Signed)

## 2023-06-11 NOTE — ED Notes (Signed)
Pt advised he had sudden diarrhea. He was laying in bed, stood up to go to the bathroom and didn't make it. Got dizzy when he was cleaning up after himself. No LOC. No recent illness. No pain or acute distress.

## 2023-06-11 NOTE — Telephone Encounter (Signed)
Chief Complaint: weakness Symptoms: weakness, stumbling, sweating, incontinence of bowel and bladder Frequency: symptoms began at 0730 today Pertinent Negatives: Patient denies fever, SOB, CP Disposition: [x] ED /[] Urgent Care (no appt availability in office) / [] Appointment(In office/virtual)/ []  [place] [facility]/ [] Home Care/ [] Refused Recommended Disposition /[] [facility]/ []  Follow-up with PCP Additional Notes: Pt reports weakness, sweating, and loss of bowel (diarrhea) and bladder control since 0730 today. Pt states he has been incontinent before. Pt denies fever, CP, SOB. Pt denies fever. Pt sounds weak to triager. Pt states "once I'm up, I'm ok, but if I am stooping over, I fall and I have to grab onto something." Pt states he has already fallen today - denies head injury. Given protocol, this RN advised pt to go to the ED. Pt agreeable. Wife nearby states she will take the pt. This RN advised pt go to [facility] ED at [place] since it is closest to pt's home. Pt verbalized understanding and states they will leave now.   Copied from CRM [id number]. Topic: Clinical - Red Word Triage >> Jun 11, 2023  8:40 AM Adele Barthel wrote: Red Word that prompted transfer to Nurse Triage: Patient has been having weakness and fatigue, making it hard to stand up. Has been sweating but no fever. Having difficulty controlling bladder and bowels. Reason for Disposition  Patient sounds very sick or weak to the triager  Answer Assessment - Initial Assessment Questions 1. DESCRIPTION: "Describe how you are feeling."     Weakness and stumbling, sweating, incontinence of bowel and bladder 2. SEVERITY: "How bad is it?"  "Can you stand and walk?"   - MILD (0-3): Feels weak or tired, but does not interfere with work, school or normal activities.   - MODERATE (4-7): Able to stand and walk; weakness interferes with work, school, or normal activities.   - SEVERE (8-10): Unable to stand or walk; unable to do  usual activities.     Severe - "I'm fine once I get up, but if I'm in a position of stooping, I fall down", states he fell this AM and no head injury. 3. ONSET: "When did these symptoms begin?" (e.g., hours, days, weeks, months)     0730 this AM 4. CAUSE: "What do you think is causing the weakness or fatigue?" (e.g., not drinking enough fluids, medical problem, trouble sleeping)     Not sure - pt has been drinking fluids, no new medications 5. NEW MEDICINES:  "Have you started on any new medicines recently?" (e.g., opioid pain medicines, benzodiazepines, muscle relaxants, antidepressants, antihistamines, neuroleptics, beta blockers)     No 6. OTHER SYMPTOMS: "Do you have any other symptoms?" (e.g., chest pain, fever, cough, SOB, vomiting, diarrhea, bleeding, other areas of pain)     Denies CP, denies SOB. Endorses diarrhea. No vomiting. Pt states he is stumbling, felt bad yesterady as well.  Protocols used: Weakness (Generalized) and Fatigue-A-AH

## 2023-06-11 NOTE — ED Triage Notes (Signed)
Pt reports feeling tired and fatigued last night. One episode of watery brown diarrhea this am. NO N/V. Denies pain

## 2023-06-11 NOTE — Discharge Instructions (Signed)
Be sure to drink appropriate amounts of fluids and increase your fluid intake while you are having diarrhea.  You may take Imodium per the package instructions if needed for diarrhea.  If you develop new or worsening weakness, pain, bloody diarrhea, fever, or any other new/concerning symptoms then return to the ER or call 911.

## 2023-06-11 NOTE — ED Provider Notes (Signed)
[facility] Provider Note   CSN: [id number] Arrival date & time: 06/11/23  1324     History  Chief Complaint  Patient presents with   Diarrhea    Adam Peters is a 73 y.o. male.  HPI 73 year old male presents with diarrhea. He seemed "off" to his significant other yesterday, but nothing specific. Today he woke up and had to have a BM. He was going to the bathroom but didn't make it in time and had diarrhea on the floor. His significant other explains he does this from time to time with urinating as he underestimates how quickly he needs to go to the bathroom.  His only happen 1 other time with diarrhea.  The patient does not think there was any blood in the stool.  He has no fever, abdominal pain, chest pain, shortness of breath, or focal weakness/numbness.  He has some memory issues/cognitive issues at baseline.  He has chronic back pain but no new or worse back pain.  No leg weakness or numbness.  He cleaned up the stool and at 1 point got lightheaded during that episode but did not pass out.  Right now he feels fine.  He ambulated into the ER.  Home Medications Prior to Admission medications   Medication Sig Start Date End Date Taking? Authorizing Provider  amLODipine (NORVASC) 5 MG tablet Take 5 mg by mouth daily. 04/13/22   [provider]  aspirin EC 81 MG tablet Take 81 mg by mouth daily.    [provider]  atorvastatin (LIPITOR) 40 MG tablet TAKE 1 TABLET DAILY 08/10/22   [name], Lisette Abu, MD  azelastine (ASTELIN) 0.1 % nasal spray Use 1 to 2 sprays in each nostril 1-2 times a day as needed for runny nose/drainage down throat 10/13/22   Nehemiah Settle, FNP  Cholecalciferol (VITAMIN D) 50 MCG (2000 UT) tablet Take by mouth. 12/03/20   [provider]  clopidogrel (PLAVIX) 75 MG tablet Take 1 tablet by mouth daily. 05/14/22   [provider]  escitalopram (LEXAPRO) 20 MG tablet Take 25 mg by  mouth daily.    [provider]  Evolocumab (REPATHA SURECLICK) 140 MG/ML SOAJ Inject 140 mg into the skin every 14 (fourteen) days. 11/03/22   [name], Lisette Abu, MD  fexofenadine (ALLEGRA) 180 MG tablet Take 1 tablet (180 mg total) by mouth daily as needed for allergies or rhinitis (Can take an extra dose during flare ups.). 12/29/22   Nehemiah Settle, FNP  fluticasone Aleda Grana) 50 MCG/ACT nasal spray Use 2 sprays in each nostril once a day as needed for stuffy nose 10/13/22   Nehemiah Settle, FNP  nitroGLYCERIN (NITROSTAT) 0.4 MG SL tablet DISSOLVE 1 TABLET UNDER THE TONGUE EVERY 5 MINUTES AS NEEDED FOR CHEST PAIN (SCHEDULE AN APPOINTMENT FOR FUTURE REFILLS) 10/20/22   [name], Lisette Abu, MD  olmesartan (BENICAR) 20 MG tablet TAKE 1 TABLET DAILY 02/25/23   Georgina Quint, MD  omeprazole (PRILOSEC) 20 MG capsule TAKE 2 CAPSULES TWICE A DAY BEFORE MEALS 04/28/23   [name], Lisette Abu, MD  silodosin (RAPAFLO) 8 MG CAPS capsule Take 8 mg by mouth daily. 09/19/21   [provider]  STIOLTO RESPIMAT 2.5-2.5 MCG/ACT AERS USE 2 INHALATIONS DAILY 01/12/23   Nehemiah Settle, FNP  Vibegron [facility]) 75 MG TABS  03/25/22   [provider]      Allergies    Amoxicillin    Review of Systems   Review  of Systems  Constitutional:  Negative for fever.  Respiratory:  Negative for shortness of breath.   Cardiovascular:  Negative for chest pain.  Gastrointestinal:  Positive for diarrhea. Negative for abdominal pain and blood in stool.  Genitourinary:  Negative for dysuria.  Neurological:  Negative for syncope.    Physical Exam Updated Vital Signs BP 138/83   Pulse (!) 57   Temp 98.1 F (36.7 C) (Oral)   Resp 18   Wt 102.1 kg   SpO2 99%   BMI 28.89 kg/m  Physical Exam Vitals and nursing note reviewed.  Constitutional:      General: He is not in acute distress.    Appearance: He is well-developed. He is not ill-appearing or diaphoretic.  HENT:     Head: Normocephalic and  atraumatic.  Eyes:     Extraocular Movements: Extraocular movements intact.     Pupils: Pupils are equal, round, and reactive to light.  Cardiovascular:     Rate and Rhythm: Regular rhythm. Bradycardia present.     Heart sounds: Normal heart sounds.  Pulmonary:     Effort: Pulmonary effort is normal.     Breath sounds: Normal breath sounds.  Abdominal:     Palpations: Abdomen is soft.     Tenderness: There is no abdominal tenderness.  Musculoskeletal:     Thoracic back: No tenderness.     Lumbar back: No tenderness.  Skin:    General: Skin is warm and dry.  Neurological:     Mental Status: He is alert.     Comments: CN 3-12 grossly intact. 5/5 strength in all 4 extremities. Grossly normal sensation. Normal finger to nose. Normal gait.     ED Results / Procedures / Treatments   Labs (all labs ordered are listed, but only abnormal results are displayed) Labs Reviewed  COMPREHENSIVE METABOLIC PANEL - Abnormal; Notable for the following components:      Result Value   Glucose, Bld 105 (*)    Creatinine, Ser 1.34 (*)    GFR, Estimated 56 (*)    All other components within normal limits  CBC - Abnormal; Notable for the following components:   Hemoglobin 12.8 (*)    All other components within normal limits  RESP PANEL BY RT-PCR (RSV, FLU A&B, COVID)  [id number]  LIPASE, BLOOD  URINALYSIS, ROUTINE W REFLEX MICROSCOPIC    EKG EKG Interpretation Date/Time:  Friday June 11 2023 11:38:27 EST Ventricular Rate:  59 PR Interval:  146 QRS Duration:  96 QT Interval:  417 QTC Calculation: 414 R Axis:   -45  Text Interpretation: Sinus bradycardia Left anterior fascicular block Abnormal R-wave progression, early transition Left ventricular hypertrophy no acute ST/T changes HR a little slower, but otherwise ECG similar to 2019 Confirmed by Pricilla Loveless [phone]) on 06/11/2023 11:49:06 AM  Radiology No results found.  Procedures Procedures    Medications Ordered in  ED Medications - No data to display  ED Course/ Medical Decision Making/ A&P                                 Medical Decision Making Amount and/or Complexity of Data Reviewed Labs: ordered.    Details: Stable chronic anemia ECG/medicine tests: ordered and independent interpretation performed.    Details: Mild sinus bradycardia, no ischemia   It seems like the patient had some diarrhea and didn't make it to the bathroom in time rather than true incontinence.  He feels completely asymptomatic at this time and has no concerning findings on workup or exam.  He had another episode of diarrhea here but was able to get to the bathroom in time.  No blood in the stool.  At this point, I discussed supportive care for diarrhea but I do not think he has a serious illness, acute neuro, cardiovascular or other emergency and I think he is stable for discharge.  Will give return precautions.       Final Clinical Impression(s) / ED Diagnoses Final diagnoses:  Diarrhea, unspecified type    Rx / DC Orders ED Discharge Orders     None         Pricilla Loveless, MD 06/11/23 1206

## 2023-06-11 NOTE — ED Notes (Signed)
Cannot discharge, registration in chart.

## 2023-06-30 ENCOUNTER — Other Ambulatory Visit: Payer: Self-pay | Admitting: Family

## 2023-06-30 ENCOUNTER — Other Ambulatory Visit: Payer: Self-pay | Admitting: *Deleted

## 2023-06-30 MED ORDER — STIOLTO RESPIMAT 2.5-2.5 MCG/ACT IN AERS: INHALATION_SPRAY | RESPIRATORY_TRACT | 1 refills | Status: DC

## 2023-08-25 ENCOUNTER — Ambulatory Visit: Payer: Medicare Other

## 2023-08-25 ENCOUNTER — Ambulatory Visit (HOSPITAL_COMMUNITY): Payer: Medicare Other

## 2023-08-25 ENCOUNTER — Ambulatory Visit (HOSPITAL_COMMUNITY): Payer: Medicare Other | Attending: Vascular Surgery

## 2023-08-25 NOTE — Progress Notes (Deleted)
 HISTORY AND PHYSICAL     CC:  follow up. Requesting Provider:  Georgina Quint, *  HPI: This is a 73 y.o. male who is here today for follow up for PAD.  Pt has hx of claudication.  In February 2024 at the [facility] in [place], he states he had stents placed in both legs and has has cards for this.   Pt was last seen 03/03/2023 and at that time, he was able to walk without difficulty.  He was compliant with his asa and statin and had stopped his plavix.  He had palpable DP pulses bilaterally.    The pt returns today for follow up.  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   Repatha The pt is on an aspirin.    Other AC:  none The pt is on CCB, ARB for hypertension.  The pt is not on medication for diabetes. Tobacco hx:  former  Pt does not have family hx of AAA.  Past Medical History:  Diagnosis Date   Allergy    seasonal   Anemia    Anxiety    Asthma    CAD (coronary artery disease)    a. 1997 PCI/BMS to distal LAD (J&J PS BMS);  b. 03/2011 Neg MV;  c. 07/2013 NSTEMI/PCI: LM 20d, LAD 48m, 50d isr, LCX nl, OM1/2 nl, RCA dom 95-71m (3.5x16 Promus premier DES), EF 55%, mild basal inf HK.   Cataract    Coronary artery disease    s/p PCI to LAD (1997) - Dr. Langston Reusing   GERD (gastroesophageal reflux disease)    Hepatitis C    History of tobacco use    Hyperlipidemia    Hypertension    Schizophrenia (HCC) 1978    Past Surgical History:  Procedure Laterality Date   APPENDECTOMY  1965   CARDIAC CATHETERIZATION  2001   patent LAD stent (Dr Hiram Gash)   CORONARY ANGIOPLASTY WITH STENT PLACEMENT  06/09/1995   prox LAD stenting 80% to 0% (Dr. Langston Reusing) - repeat cath in 11/1999 showed no significant coronary disease & normal systolic function (Dr. Laurell Josephs)   CORONARY STENT PLACEMENT  08/02/2013   RCA   DES         DR COOPER   CORONARY STENT PLACEMENT  07/16/2022   HERNIA REPAIR  1994   knife wound  repair  1969   LEFT HEART CATHETERIZATION WITH CORONARY ANGIOGRAM N/A 08/02/2013   Procedure: LEFT HEART CATHETERIZATION WITH CORONARY ANGIOGRAM;  Surgeon: Micheline Chapman, MD;  Location: [facility] CATH LAB;  Service: Cardiovascular;  Laterality: N/A;   TRANSTHORACIC ECHOCARDIOGRAM  11/13/2008   EF 50-55%, normal LV systolic function; RA mildly dilated; trace MR/TR/PR    Allergies  Allergen Reactions   Amoxicillin Diarrhea    GI upset.    Current Outpatient Medications  Medication Sig Dispense Refill   amLODipine (NORVASC) 5 MG tablet Take 5 mg by mouth daily.     aspirin EC 81 MG tablet Take 81 mg by mouth daily.     atorvastatin (LIPITOR) 40 MG tablet TAKE 1 TABLET DAILY 90 tablet 3   azelastine (ASTELIN) 0.1 % nasal spray USE 1 TO 2 SPRAYS IN EACH NOSTRIL 1 TO 2 TIMES A DAY AS NEEDED FOR RUNNY NOSE, DRAINAGE DOWN THROAT 90 mL 1   Cholecalciferol (VITAMIN D) 50 MCG (2000 UT) tablet Take by mouth.  clopidogrel (PLAVIX) 75 MG tablet Take 1 tablet by mouth daily.     escitalopram (LEXAPRO) 20 MG tablet Take 25 mg by mouth daily.     Evolocumab (REPATHA SURECLICK) 140 MG/ML SOAJ Inject 140 mg into the skin every 14 (fourteen) days. 6 mL 3   fexofenadine (ALLEGRA) 180 MG tablet Take 1 tablet (180 mg total) by mouth daily as needed for allergies or rhinitis (Can take an extra dose during flare ups.). 90 tablet 1   fluticasone (FLONASE) 50 MCG/ACT nasal spray Use 2 sprays in each nostril once a day as needed for stuffy nose 48 g 1   nitroGLYCERIN (NITROSTAT) 0.4 MG SL tablet DISSOLVE 1 TABLET UNDER THE TONGUE EVERY 5 MINUTES AS NEEDED FOR CHEST PAIN (SCHEDULE AN APPOINTMENT FOR FUTURE REFILLS) 25 tablet 11   olmesartan (BENICAR) 20 MG tablet TAKE 1 TABLET DAILY 90 tablet 3   omeprazole (PRILOSEC) 20 MG capsule TAKE 2 CAPSULES TWICE A DAY BEFORE MEALS 360 capsule 1   silodosin (RAPAFLO) 8 MG CAPS capsule Take 8 mg by mouth daily.     STIOLTO RESPIMAT 2.5-2.5 MCG/ACT AERS Inhale two puffs once  daily 12 g 1   Vibegron (GEMTESA) 75 MG TABS      No current facility-administered medications for this visit.    Family History  Problem Relation Age of Onset   Asthma Mother    Crohn's disease Mother    Heart disease Mother    Hepatitis C Brother    Testicular cancer Brother    Dementia Father    Diabetes Maternal Grandmother    Emphysema Maternal Aunt    Cancer Maternal Aunt    Colon cancer Neg Hx     Social History   Socioeconomic History   Marital status: Married    Spouse name: Not on file   Number of children: Not on file   Years of education: B.A.   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  Education 4 yr college.  WorK: retired.     Social Drivers of Health   Financial Resource Strain: Patient Declined (11/16/2022)   Overall Financial Resource Strain (CARDIA)    Difficulty of Paying Living Expenses: Patient declined  Food Insecurity: Food Insecurity Present (11/16/2022)   Hunger Vital Sign    Worried About Running Out of Food in the Last Year: Sometimes true    Ran Out of Food in the Last Year: Sometimes true  Transportation Needs: No Transportation Needs (11/16/2022)   PRAPARE - Administrator, Civil Service (Medical): No    Lack of Transportation (Non-Medical): No  Physical Activity: Sufficiently Active (11/16/2022)   Exercise Vital Sign    Days of Exercise per Week: 5 days     Minutes of Exercise per Session: 90 min  Stress:  No Stress Concern Present (11/16/2022)   [employer] of Occupational Health - Occupational Stress Questionnaire    Feeling of Stress : Not at all  Social Connections: Unknown (11/16/2022)   Social Connection and Isolation Panel [NHANES]    Frequency of Communication with Friends and Family: Once a week    Frequency of Social Gatherings with Friends and Family: More than three times a week    Attends Religious Services: Not on file    Active Member of Clubs or Organizations: Yes    Attends Banker Meetings: Patient declined    Marital Status: Married  Catering manager Violence: Not At Risk (11/16/2022)   Humiliation, Afraid, Rape, and Kick questionnaire    Fear of Current or Ex-Partner: No    Emotionally Abused: No    Physically Abused: No    Sexually Abused: No     REVIEW OF SYSTEMS:  *** [X]  denotes positive finding, [ ]  denotes negative finding Cardiac  Comments:  Chest pain or chest pressure:    Shortness of breath upon exertion:    Short of breath when lying flat:    Irregular heart rhythm:        Vascular    Pain in calf, thigh, or hip brought on by ambulation:    Pain in feet at night that wakes you up from your sleep:     Blood clot in your veins:    Leg swelling:         Pulmonary    Oxygen at home:    Productive cough:     Wheezing:         Neurologic    Sudden weakness in arms or legs:     Sudden numbness in arms or legs:     Sudden onset of difficulty speaking or slurred speech:    Temporary loss of vision in one eye:     Problems with dizziness:         Gastrointestinal    Blood in stool:     Vomited blood:         Genitourinary    Burning when urinating:     Blood in urine:        Psychiatric    Major depression:         Hematologic    Bleeding problems:    Problems with blood clotting too easily:        Skin    Rashes or ulcers:        Constitutional    Fever or chills:       PHYSICAL EXAMINATION:  ***  General:  WDWN in NAD; vital signs documented above Gait: Not observed HENT: WNL, normocephalic Pulmonary: normal non-labored breathing , without wheezing Cardiac: {Desc; regular/irreg:14544} HR, {With/Without:20273} carotid bruit*** Abdomen: soft, NT; aortic pulse is *** palpable Skin: {With/Without:20273} rashes Vascular Exam/Pulses:  Right Left  Radial {Exam; arterial pulse strength 0-4:30167} {Exam; arterial pulse strength 0-4:30167}  Femoral {Exam; arterial pulse strength 0-4:30167} {Exam; arterial pulse strength 0-4:30167}  Popliteal {Exam; arterial pulse strength 0-4:30167} {Exam; arterial pulse strength 0-4:30167}  DP {Exam; arterial pulse strength 0-4:30167} {Exam; arterial pulse strength 0-4:30167}  PT {Exam; arterial pulse strength 0-4:30167} {Exam; arterial pulse strength 0-4:30167}  Peroneal *** ***   Extremities: {With/Without:20273} ischemic changes, {With/Without:20273} Gangrene , {With/Without:20273} cellulitis; {With/Without:20273} open wounds Musculoskeletal: no muscle wasting or atrophy  Neurologic: A&O X 3 Psychiatric:  The pt has {Desc; normal/abnormal:11317::"Normal"} affect.   Non-Invasive Vascular Imaging:   ABI's/TBI's on 08/25/2023: Right:  *** -  Great toe pressure: *** Left:  *** - Great toe pressure: ***  Arterial duplex on 08/25/2023: ***  Previous ABI's/TBI's on 03/03/2023: Right:  1.06/0.91 - Great toe pressure: 151 Left:  0.95/0.92 - Great toe pressure:  153     ASSESSMENT/PLAN:: 73 y.o. male here for follow up for PAD with hx of BLE stenting at the [place] in February 2024   -*** -continue asa/statin -pt will f/u in *** with ***.   Doreatha Massed, [facility] [phone]  Clinic MD:   Randie Heinz

## 2023-08-26 ENCOUNTER — Ambulatory Visit (INDEPENDENT_AMBULATORY_CARE_PROVIDER_SITE_OTHER): Admitting: Allergy & Immunology

## 2023-08-26 ENCOUNTER — Telehealth: Payer: Self-pay | Admitting: Internal Medicine

## 2023-08-26 VITALS — BP 120/82 | HR 62 | Temp 98.1°F | Resp 16 | Ht 74.0 in | Wt 229.4 lb

## 2023-08-26 DIAGNOSIS — J4540 Moderate persistent asthma, uncomplicated: Secondary | ICD-10-CM | POA: Diagnosis not present

## 2023-08-26 DIAGNOSIS — K219 Gastro-esophageal reflux disease without esophagitis: Secondary | ICD-10-CM | POA: Diagnosis not present

## 2023-08-26 DIAGNOSIS — J302 Other seasonal allergic rhinitis: Secondary | ICD-10-CM

## 2023-08-26 DIAGNOSIS — J3089 Other allergic rhinitis: Secondary | ICD-10-CM | POA: Diagnosis not present

## 2023-08-26 NOTE — Progress Notes (Unsigned)
 FOLLOW UP  Date of Service/Encounter:  08/26/23   Assessment:   Seasonal and perennial allergic rhinitis  Moderate persistent asthma, uncomplicated  Gastroesophageal reflux disease  Stepping down therapy   Plan/Recommendations:   1. Moderate persistent asthma, uncomplicated - Lung testing looked good today.  - Daily controller medication(s): Stilolto two puffs once daily - Prior to physical activity: albuterol 2 puffs 10-15 minutes before physical activity. - Rescue medications: albuterol 4 puffs every 4-6 hours as needed - Asthma control goals:  * Full participation in all desired activities (may need albuterol before activity) * Albuterol use two time or less a week on average (not counting use with activity) * Cough interfering with sleep two time or less a month * Oral steroids no more than once a year * No hospitalizations  2. Seasonal and perennial allergic rhinitis (grasses, ragweed, trees, indoor molds, outdoor molds, cat and dog) - Stop taking: Astelin (azelastine) 2 sprays per nostril 1-2 times daily - Continue with: Allegra (fexofenadine) 180mg  tablet once daily and Flonase (fluticasone) two sprays per nostril daily and   Allergy shots might be the best treatment option in the long run since that can decrease mucous production, but we will keep this in our back pocket.  - Wait three months and then STOP the Flonase (fluticasone) and see how you do.   3. Reflux - Continue dietary and lifestyle modifications as listed below - Continue Prilosec as previously prescribed. Take this medication at least 30 minutes before a meal for best result  4. Return in about 6 months (around 02/25/2024). You can have the follow up appointment with Dr. Dellis Anes or a Nurse Practicioner (our Nurse Practitioners are excellent and always have Physician oversight!).   Subjective:   Adam Peters is a 73 y.o. male presenting today for follow up of No chief complaint on  file.   Adam Peters has a history of the following: Patient Active Problem List   Diagnosis Date Noted   Overexertion 09/01/2022   Gastroesophageal reflux disease 07/03/2022   Cognitive changes 05/20/2022   Peripheral arterial disease (HCC) 03/03/2022   Intermittent claudication (HCC) 02/16/2022   Chronic right shoulder pain 02/16/2022   BPH with obstruction/lower urinary tract symptoms 09/08/2021   Moderate persistent asthma, uncomplicated 07/03/2021   OSA on CPAP 03/10/2021   Asthma 10/03/2020   Seasonal and perennial allergic rhinitis 04/04/2019   Ex-smoker 04/04/2019   Mild persistent asthma, uncomplicated 04/04/2019   Dyspnea on exertion 01/23/2016   Essential hypertension    Dyslipidemia    History of tobacco use 07/08/2012   Glaucoma 04/08/2012   DDD (degenerative disc disease), cervical    CAD- LAD BMS '97, RCA DES 08/02/13 08/17/2011    History obtained from: chart review and patient.  Discussed the use of AI scribe software for clinical note transcription with the patient and/or guardian, who gave verbal consent to proceed.  Adam Peters is a 73 y.o. male presenting for a follow up visit.  He was last seen in July 2024 by Nehemiah Settle.  At that time, we continued with Stiolto 2 puffs once daily and albuterol as needed.  For his rhinitis, we continue with Allegra as well as Flonase and Astelin.  He also continue with salt water rinses used before the nose sprays.  For his reflux, we continue with omeprazole.  Since last visit, he is doing very well.  Asthma/Respiratory Symptom History: He remains on the Stiolto 2 puffs once daily.  And not sure when he  was changed to this, as he was on [place].  However, the Stiolto seems to be the trick so we will make any changes today.  He has not been on prednisone and has not been to the emergency room.  Overall, his symptoms are under excellent control.  ACT score is 25, indicating excellent asthma control.  Allergic Rhinitis  Symptom History: For his rhinitis, he remains on the Allegra as well as Flonase and Astelin.  He has remained on salt water rinses.  Interested in getting off of more medications today and is wondering what he can stop.  We did already stop the Singulair.  GERD Symptom History: Reflux is under good control with omeprazole daily.  He does not have any symptoms when he is on this medication.  Otherwise, there have been no changes to his past medical history, surgical history, family history, or social history.    Review of systems otherwise negative other than that mentioned in the HPI.    Objective:   Blood pressure 120/82, pulse 62, temperature 98.1 F (36.7 C), temperature source Temporal, resp. rate 16, height 6\' 2"  (1.88 m), weight 229 lb 6.4 oz (104.1 kg), SpO2 98%. Body mass index is 29.45 kg/m.    Physical Exam Vitals reviewed.  Constitutional:      Appearance: He is well-developed.     Comments: Pleasant.  Cooperative.  HENT:     Head: Normocephalic and atraumatic.     Right Ear: Tympanic membrane, ear canal and external ear normal.     Left Ear: Tympanic membrane, ear canal and external ear normal.     Nose: No nasal deformity, septal deviation, mucosal edema or rhinorrhea.     Right Turbinates: Enlarged, swollen and pale.     Left Turbinates: Enlarged, swollen and pale.     Right Sinus: No maxillary sinus tenderness or frontal sinus tenderness.     Left Sinus: No maxillary sinus tenderness or frontal sinus tenderness.     Comments: No polyps.    Mouth/Throat:     Mouth: Mucous membranes are not pale and not dry.     Pharynx: Uvula midline.  Eyes:     General: Lids are normal. No allergic shiner.       Right eye: No discharge.        Left eye: No discharge.     Conjunctiva/sclera: Conjunctivae normal.     Right eye: Right conjunctiva is not injected. No chemosis.    Left eye: Left conjunctiva is not injected. No chemosis.    Pupils: Pupils are equal, round, and  reactive to light.  Cardiovascular:     Rate and Rhythm: Normal rate and regular rhythm.     Heart sounds: Normal heart sounds.  Pulmonary:     Effort: Pulmonary effort is normal. No tachypnea, accessory muscle usage or respiratory distress.     Breath sounds: Normal breath sounds. No wheezing, rhonchi or rales.     Comments: Moving air well in all lung fields.  No increased work of breathing. Chest:     Chest wall: No tenderness.  Lymphadenopathy:     Cervical: No cervical adenopathy.  Skin:    General: Skin is warm.     Capillary Refill: Capillary refill takes less than 2 seconds.     Coloration: Skin is not pale.     Findings: No abrasion, erythema, petechiae or rash. Rash is not papular, urticarial or vesicular.     Comments: No eczematous or urticarial lesions noted.  Neurological:  Mental Status: He is alert.  Psychiatric:        Behavior: Behavior is cooperative.      Diagnostic studies:    Spirometry: results normal (FEV1: 2.63/88%, FVC: 3.93/99%, FEV1/FVC: 67%).    Spirometry consistent with normal pattern.    Allergy Studies: none       Malachi Bonds, MD  [facility]

## 2023-08-26 NOTE — Patient Instructions (Addendum)
 1. Moderate persistent asthma, uncomplicated - Lung testing looked good today.  - Daily controller medication(s): Stilolto two puffs once daily - Prior to physical activity: albuterol 2 puffs 10-15 minutes before physical activity. - Rescue medications: albuterol 4 puffs every 4-6 hours as needed - Asthma control goals:  * Full participation in all desired activities (may need albuterol before activity) * Albuterol use two time or less a week on average (not counting use with activity) * Cough interfering with sleep two time or less a month * Oral steroids no more than once a year * No hospitalizations  2. Seasonal and perennial allergic rhinitis (grasses, ragweed, trees, indoor molds, outdoor molds, cat and dog) - Stop taking: Astelin (azelastine) 2 sprays per nostril 1-2 times daily - Continue with: Allegra (fexofenadine) 180mg  tablet once daily and Flonase (fluticasone) two sprays per nostril daily and   Allergy shots might be the best treatment option in the long run since that can decrease mucous production, but we will keep this in our back pocket.  - Wait three months and then STOP the Flonase (fluticasone) and see how you do.   3. Reflux - Continue dietary and lifestyle modifications as listed below - Continue Prilosec as previously prescribed. Take this medication at least 30 minutes before a meal for best result  4. Return in about 6 months (around 02/25/2024). You can have the follow up appointment with Dr. Dellis Anes or a Nurse Practicioner (our Nurse Practitioners are excellent and always have Physician oversight!).    Please inform us of any Emergency Department visits, hospitalizations, or changes in symptoms. Call us before going to the ED for breathing or allergy symptoms since we might be able to fit you in for a sick visit. Feel free to contact us anytime with any questions, problems, or concerns.  It was a pleasure to see you again today!  Websites that have reliable  patient information: 1. American Academy of Asthma, Allergy, and Immunology: www.aaaai.org 2. Food Allergy Research and Education (FARE): foodallergy.org 3. Mothers of Asthmatics: http://www.asthmacommunitynetwork.org 4. American College of Allergy, Asthma, and Immunology: www.acaai.org      "Like" [place] on Facebook and Instagram for our latest updates!      A healthy democracy works best when [employer] participate! Make sure you are registered to vote! If you have moved or changed any of your contact information, you will need to get this updated before voting! Scan the QR codes below to learn more!

## 2023-08-26 NOTE — Telephone Encounter (Signed)
  Per MyChart scheduling message:  Statins drugs are making it difficult to get around. A lot of times sluggishly and with cramping.

## 2023-08-26 NOTE — Telephone Encounter (Signed)
 [name], MD  You23 minutes ago (4:12 PM)    Advise stop atorvastatin for 2 weeks - report if symptoms improve. Would continue Repatha injections every 2 weeks.  Dr H   Patient identification verified by 2 forms. Adam Rail, RN    Called and spoke to patient  Relayed provider message  Patient agrees with plan, no questions at this time

## 2023-08-27 ENCOUNTER — Encounter: Payer: Self-pay | Admitting: Allergy & Immunology

## 2023-09-01 ENCOUNTER — Encounter (HOSPITAL_COMMUNITY): Payer: Medicare Other

## 2023-09-01 ENCOUNTER — Ambulatory Visit: Payer: Medicare Other

## 2023-09-08 ENCOUNTER — Other Ambulatory Visit: Payer: Self-pay | Admitting: Internal Medicine

## 2023-09-09 ENCOUNTER — Ambulatory Visit: Admitting: Emergency Medicine

## 2023-09-09 ENCOUNTER — Encounter: Payer: Self-pay | Admitting: Emergency Medicine

## 2023-09-09 VITALS — BP 128/86 | HR 74 | Temp 98.0°F | Ht 74.0 in | Wt 231.0 lb

## 2023-09-09 DIAGNOSIS — N401 Enlarged prostate with lower urinary tract symptoms: Secondary | ICD-10-CM | POA: Diagnosis not present

## 2023-09-09 DIAGNOSIS — G4733 Obstructive sleep apnea (adult) (pediatric): Secondary | ICD-10-CM

## 2023-09-09 DIAGNOSIS — J4540 Moderate persistent asthma, uncomplicated: Secondary | ICD-10-CM | POA: Diagnosis not present

## 2023-09-09 DIAGNOSIS — I10 Essential (primary) hypertension: Secondary | ICD-10-CM

## 2023-09-09 DIAGNOSIS — E785 Hyperlipidemia, unspecified: Secondary | ICD-10-CM

## 2023-09-09 DIAGNOSIS — I739 Peripheral vascular disease, unspecified: Secondary | ICD-10-CM | POA: Diagnosis not present

## 2023-09-09 DIAGNOSIS — K219 Gastro-esophageal reflux disease without esophagitis: Secondary | ICD-10-CM

## 2023-09-09 DIAGNOSIS — N138 Other obstructive and reflux uropathy: Secondary | ICD-10-CM | POA: Diagnosis not present

## 2023-09-09 DIAGNOSIS — I2510 Atherosclerotic heart disease of native coronary artery without angina pectoris: Secondary | ICD-10-CM | POA: Diagnosis not present

## 2023-09-09 LAB — COMPREHENSIVE METABOLIC PANEL WITH GFR
ALT: 25 U/L (ref 0–53)
AST: 22 U/L (ref 0–37)
Albumin: 4.2 g/dL (ref 3.5–5.2)
Alkaline Phosphatase: 94 U/L (ref 39–117)
BUN: 17 mg/dL (ref 6–23)
CO2: 26 meq/L (ref 19–32)
Calcium: 9.5 mg/dL (ref 8.4–10.5)
Chloride: 106 meq/L (ref 96–112)
Creatinine, Ser: 1.2 mg/dL (ref 0.40–1.50)
GFR: 60 mL/min — ABNORMAL LOW (ref >60.00)
Glucose, Bld: 118 mg/dL — ABNORMAL HIGH (ref 70–99)
Potassium: 3.8 meq/L (ref 3.5–5.1)
Sodium: 138 meq/L (ref 135–145)
Total Bilirubin: 0.7 mg/dL (ref 0.2–1.2)
Total Protein: 7.5 g/dL (ref 6.0–8.3)

## 2023-09-09 LAB — LIPID PANEL
Cholesterol: 76 mg/dL (ref 0–200)
HDL: 47.4 mg/dL (ref >39.00)
LDL Cholesterol: 15 mg/dL (ref 0–99)
NonHDL: 28.99
Total CHOL/HDL Ratio: 2
Triglycerides: 69 mg/dL (ref 0.0–149.0)
VLDL: 13.8 mg/dL (ref 0.0–40.0)

## 2023-09-09 LAB — CBC WITH DIFFERENTIAL/PLATELET
Basophils Absolute: 0 10*3/uL (ref 0.0–0.1)
Basophils Relative: 0.5 % (ref 0.0–3.0)
Eosinophils Absolute: 0.1 10*3/uL (ref 0.0–0.7)
Eosinophils Relative: 2.4 % (ref 0.0–5.0)
HCT: 41.8 % (ref 39.0–52.0)
Hemoglobin: 13.6 g/dL (ref 13.0–17.0)
Lymphocytes Relative: 24.2 % (ref 12.0–46.0)
Lymphs Abs: 1.2 10*3/uL (ref 0.7–4.0)
MCHC: 32.6 g/dL (ref 30.0–36.0)
MCV: 93.7 fl (ref 78.0–100.0)
Monocytes Absolute: 0.3 10*3/uL (ref 0.1–1.0)
Monocytes Relative: 6.3 % (ref 3.0–12.0)
Neutro Abs: 3.4 10*3/uL (ref 1.4–7.7)
Neutrophils Relative %: 66.6 % (ref 43.0–77.0)
Platelets: 164 10*3/uL (ref 150.0–400.0)
RBC: 4.46 Mil/uL (ref 4.22–5.81)
RDW: 13.1 % (ref 11.5–15.5)
WBC: 5.1 10*3/uL (ref 4.0–10.5)

## 2023-09-09 NOTE — Patient Instructions (Signed)
 Health Maintenance After Age 74 After age 4, you are at a higher risk for certain long-term diseases and infections as well as injuries from falls. Falls are a major cause of broken bones and head injuries in people who are older than age 47. Getting regular preventive care can help to keep you healthy and well. Preventive care includes getting regular testing and making lifestyle changes as recommended by your health care provider. Talk with your health care provider about: Which screenings and tests you should have. A screening is a test that checks for a disease when you have no symptoms. A diet and exercise plan that is right for you. What should I know about screenings and tests to prevent falls? Screening and testing are the best ways to find a health problem early. Early diagnosis and treatment give you the best chance of managing medical conditions that are common after age 37. Certain conditions and lifestyle choices may make you more likely to have a fall. Your health care provider may recommend: Regular vision checks. Poor vision and conditions such as cataracts can make you more likely to have a fall. If you wear glasses, make sure to get your prescription updated if your vision changes. Medicine review. Work with your health care provider to regularly review all of the medicines you are taking, including over-the-counter medicines. Ask your health care provider about any side effects that may make you more likely to have a fall. Tell your health care provider if any medicines that you take make you feel dizzy or sleepy. Strength and balance checks. Your health care provider may recommend certain tests to check your strength and balance while standing, walking, or changing positions. Foot health exam. Foot pain and numbness, as well as not wearing proper footwear, can make you more likely to have a fall. Screenings, including: Osteoporosis screening. Osteoporosis is a condition that causes  the bones to get weaker and break more easily. Blood pressure screening. Blood pressure changes and medicines to control blood pressure can make you feel dizzy. Depression screening. You may be more likely to have a fall if you have a fear of falling, feel depressed, or feel unable to do activities that you used to do. Alcohol use screening. Using too much alcohol can affect your balance and may make you more likely to have a fall. Follow these instructions at home: Lifestyle Do not drink alcohol if: Your health care provider tells you not to drink. If you drink alcohol: Limit how much you have to: 0-1 drink a day for women. 0-2 drinks a day for men. Know how much alcohol is in your drink. In the [place] one drink equals one 12 oz bottle of beer (355 mL), one 5 oz glass of wine (148 mL), or one 1 oz glass of hard liquor (44 mL). Do not use any products that contain nicotine or tobacco. These products include cigarettes, chewing tobacco, and vaping devices, such as e-cigarettes. If you need help quitting, ask your health care provider. Activity  Follow a regular exercise program to stay fit. This will help you maintain your balance. Ask your health care provider what types of exercise are appropriate for you. If you need a cane or walker, use it as recommended by your health care provider. Wear supportive shoes that have nonskid soles. Safety  Remove any tripping hazards, such as rugs, cords, and clutter. Install safety equipment such as grab bars in bathrooms and safety rails on stairs. Keep rooms and walkways  well-lit. General instructions Talk with your health care provider about your risks for falling. Tell your health care provider if: You fall. Be sure to tell your health care provider about all falls, even ones that seem minor. You feel dizzy, tiredness (fatigue), or off-balance. Take over-the-counter and prescription medicines only as told by your health care provider. These include  supplements. Eat a healthy diet and maintain a healthy weight. A healthy diet includes low-fat dairy products, low-fat (lean) meats, and fiber from whole grains, beans, and lots of fruits and vegetables. Stay current with your vaccines. Schedule regular health, dental, and eye exams. Summary Having a healthy lifestyle and getting preventive care can help to protect your health and wellness after age 11. Screening and testing are the best way to find a health problem early and help you avoid having a fall. Early diagnosis and treatment give you the best chance for managing medical conditions that are more common for people who are older than age 28. Falls are a major cause of broken bones and head injuries in people who are older than age 48. Take precautions to prevent a fall at home. Work with your health care provider to learn what changes you can make to improve your health and wellness and to prevent falls. This information is not intended to replace advice given to you by your health care provider. Make sure you discuss any questions you have with your health care provider. Document Revised: 09/23/2020 Document Reviewed: 09/23/2020 Elsevier Patient Education  2024 [employer].

## 2023-09-09 NOTE — Progress Notes (Signed)
 Adam Peters 73 y.o.   Chief Complaint  Patient presents with   Follow-up    Patient here for follow up LOV was 09/01/2022. Here for med renewal. No other concerns     HISTORY OF PRESENT ILLNESS: This is a 73 y.o. male here for follow-up of multiple chronic medical conditions Overall doing well.  Has no complaints or medical concerns today.  HPI   Prior to Admission medications   Medication Sig Start Date End Date Taking? Authorizing Provider  ARIPiprazole (ABILIFY) 5 MG tablet Take 5 mg by mouth daily. 04/23/23  Yes [provider]  aspirin  EC 81 MG tablet Take 81 mg by mouth daily.   Yes [provider]  atorvastatin  (LIPITOR) 40 MG tablet TAKE 1 TABLET DAILY 09/09/23  Yes [name], Aviva Lemmings, MD  camphor-menthol [facility]) lotion Apply 1 Application topically as needed. 04/12/23  Yes [provider]  Cholecalciferol (VITAMIN D) 50 MCG (2000 UT) tablet Take by mouth. 12/03/20  Yes [provider]  clobetasol ointment (TEMOVATE) 0.05 % Apply 1 Application topically 2 (two) times daily. 04/12/23  Yes [provider]  escitalopram (LEXAPRO) 20 MG tablet Take 25 mg by mouth daily.   Yes [provider]  Evolocumab  (REPATHA  SURECLICK) 140 MG/ML SOAJ Inject 140 mg into the skin every 14 (fourteen) days. 11/03/22  Yes [name], Aviva Lemmings, MD  ferrous fumarate (HEMOCYTE - 106 MG FE) 325 (106 Fe) MG TABS tablet Take 1 tablet by mouth daily. 12/30/22  Yes [provider]  fexofenadine  (ALLEGRA ) 180 MG tablet Take 1 tablet (180 mg total) by mouth daily as needed for allergies or rhinitis (Can take an extra dose during flare ups.). 12/29/22  Yes Tinnie Forehand, FNP  fluticasone  (FLONASE ) 50 MCG/ACT nasal spray Use 2 sprays in each nostril once a day as needed for stuffy nose 10/13/22  Yes Tinnie Forehand, FNP  nitroGLYCERIN  (NITROSTAT ) 0.4 MG SL tablet DISSOLVE 1 TABLET UNDER THE TONGUE EVERY 5 MINUTES AS NEEDED FOR CHEST PAIN (SCHEDULE AN  APPOINTMENT FOR FUTURE REFILLS) 10/20/22  Yes [name], Aviva Lemmings, MD  olmesartan  (BENICAR ) 20 MG tablet TAKE 1 TABLET DAILY 02/25/23  Yes Ziare Cryder, Isidro Margo, MD  omeprazole  (PRILOSEC) 20 MG capsule TAKE 2 CAPSULES TWICE A DAY BEFORE MEALS 04/28/23  Yes [name], Aviva Lemmings, MD  silodosin (RAPAFLO) 8 MG CAPS capsule Take 8 mg by mouth daily. 09/19/21  Yes [provider]  STIOLTO RESPIMAT  2.5-2.5 MCG/ACT AERS Inhale two puffs once daily 06/30/23  Yes Rochester Chuck, MD  Vibegron [facility]) 75 MG TABS  03/25/22  Yes [provider]  azelastine  (ASTELIN ) 0.1 % nasal spray USE 1 TO 2 SPRAYS IN EACH NOSTRIL 1 TO 2 TIMES A DAY AS NEEDED FOR RUNNY NOSE, DRAINAGE DOWN THROAT 06/30/23   Tinnie Forehand, FNP    Allergies  Allergen Reactions   Amoxicillin  Diarrhea    GI upset.    Patient Active Problem List   Diagnosis Date Noted   Gastroesophageal reflux disease 07/03/2022   Cognitive changes 05/20/2022   Peripheral arterial disease (HCC) 03/03/2022   Intermittent claudication (HCC) 02/16/2022   Chronic right shoulder pain 02/16/2022   BPH with obstruction/lower urinary tract symptoms 09/08/2021   Moderate persistent asthma, uncomplicated 07/03/2021   OSA on CPAP 03/10/2021   Asthma 10/03/2020   Seasonal and perennial allergic rhinitis 04/04/2019   Ex-smoker 04/04/2019   Mild persistent asthma, uncomplicated 04/04/2019   Essential hypertension    Dyslipidemia    History of tobacco use  07/08/2012   Glaucoma 04/08/2012   DDD (degenerative disc disease), cervical    CAD- LAD BMS '97, RCA DES 08/02/13 08/17/2011    Past Medical History:  Diagnosis Date   Allergy     seasonal   Anemia    Anxiety    Asthma    CAD (coronary artery disease)    a. 1997 PCI/BMS to distal LAD (J&J PS BMS);  b. 03/2011 Neg MV;  c. 07/2013 NSTEMI/PCI: LM 20d, LAD 22m, 50d isr, LCX nl, OM1/2 nl, RCA dom 95-1m (3.5x16 Promus premier DES), EF 55%, mild basal inf HK.   Cataract    Coronary artery  disease    s/p PCI to LAD (1997) - Dr. Rande Bushy   GERD (gastroesophageal reflux disease)    Hepatitis C    History of tobacco use    Hyperlipidemia    Hypertension    Schizophrenia (HCC) 1978    Past Surgical History:  Procedure Laterality Date   APPENDECTOMY  1965   CARDIAC CATHETERIZATION  2001   patent LAD stent (Dr Delaney Fearing)   CORONARY ANGIOPLASTY WITH STENT PLACEMENT  06/09/1995   prox LAD stenting 80% to 0% (Dr. Rande Bushy) - repeat cath in 11/1999 showed no significant coronary disease & normal systolic function (Dr. Dina Francisco)   CORONARY STENT PLACEMENT  08/02/2013   RCA   DES         DR COOPER   CORONARY STENT PLACEMENT  07/16/2022   HERNIA REPAIR  1994   knife wound repair  1969   LEFT HEART CATHETERIZATION WITH CORONARY ANGIOGRAM N/A 08/02/2013   Procedure: LEFT HEART CATHETERIZATION WITH CORONARY ANGIOGRAM;  Surgeon: Arlander Bellman, MD;  Location: [facility] CATH LAB;  Service: Cardiovascular;  Laterality: N/A;   TRANSTHORACIC ECHOCARDIOGRAM  11/13/2008   EF 50-55%, normal LV systolic function; RA mildly dilated; trace MR/TR/PR    Social History   Socioeconomic History   Marital status: Married    Spouse name: Not on file   Number of children: Not on file   Years of education: B.A.   Highest education level: Not on file  Occupational History   Occupation: retired    Associate Professor: [employer]  Tobacco Use   Smoking status: Former    Current packs/day: 0.00    Types: Cigarettes    Quit date: 05/18/1993    Years since quitting: 30.3   Smokeless tobacco: Never  Vaping Use   Vaping status: Never Used  Substance and Sexual Activity   Alcohol use: Yes    Alcohol/week: 3.0 standard drinks of alcohol    Types: 3 Glasses of wine per week    Comment: SOCIAL   Drug use: No   Sexual activity: Not Currently  Other Topics Concern   Not on file  Social History Narrative   Cafffeine" occasional.  Education 4 yr college.  WorK: retired.     Social Drivers of Health    Financial Resource Strain: Patient Declined (11/16/2022)   Overall Financial Resource Strain (CARDIA)    Difficulty of Paying Living Expenses: Patient declined  Food Insecurity: Food Insecurity Present (11/16/2022)   Hunger Vital Sign    Worried About Running Out of Food in the Last Year: Sometimes true    Ran Out of Food in the Last Year: Sometimes true  Transportation Needs: No Transportation Needs (11/16/2022)   PRAPARE - Administrator, Civil Service (Medical): No    Lack of Transportation (Non-Medical): No  Physical Activity: Sufficiently Active (11/16/2022)  Exercise Vital Sign    Days of Exercise per Week: 5 days    Minutes of Exercise per Session: 90 min  Stress: No Stress Concern Present (11/16/2022)   [employer] of Occupational Health - Occupational Stress Questionnaire    Feeling of Stress : Not at all  Social Connections: Unknown (11/16/2022)   Social Connection and Isolation Panel [NHANES]    Frequency of Communication with Friends and Family: Once a week    Frequency of Social Gatherings with Friends and Family: More than three times a week    Attends Religious Services: Not on file    Active Member of Clubs or Organizations: Yes    Attends Banker Meetings: Patient declined    Marital Status: Married  Catering manager Violence: Not At Risk (11/16/2022)   Humiliation, Afraid, Rape, and Kick questionnaire    Fear of Current or Ex-Partner: No    Emotionally Abused: No    Physically Abused: No    Sexually Abused: No    Family History  Problem Relation Age of Onset   Asthma Mother    Crohn's disease Mother    Heart disease Mother    Hepatitis C Brother    Testicular cancer Brother    Dementia Father    Diabetes Maternal Grandmother    Emphysema Maternal Aunt    Cancer Maternal Aunt    Colon cancer Neg Hx      Review of Systems  Constitutional: Negative.  Negative for chills and fever.  HENT: Negative.  Negative for congestion and sore  throat.   Respiratory: Negative.  Negative for cough and shortness of breath.   Cardiovascular: Negative.  Negative for chest pain and palpitations.  Gastrointestinal:  Negative for abdominal pain, nausea and vomiting.  Genitourinary: Negative.  Negative for hematuria.  Skin: Negative.  Negative for rash.  Neurological: Negative.  Negative for dizziness and headaches.  All other systems reviewed and are negative.   Vitals:   09/09/23 1510  BP: 128/86  Pulse: 74  Temp: 98 F (36.7 C)  SpO2: 96%    Physical Exam Vitals reviewed.  Constitutional:      Appearance: Normal appearance.  HENT:     Head: Normocephalic.     Mouth/Throat:     Mouth: Mucous membranes are moist.     Pharynx: Oropharynx is clear.  Eyes:     Extraocular Movements: Extraocular movements intact.     Pupils: Pupils are equal, round, and reactive to light.  Cardiovascular:     Rate and Rhythm: Normal rate and regular rhythm.     Pulses: Normal pulses.     Heart sounds: Normal heart sounds.  Pulmonary:     Effort: Pulmonary effort is normal.     Breath sounds: Normal breath sounds.  Abdominal:     Palpations: Abdomen is soft.     Tenderness: There is no abdominal tenderness.  Musculoskeletal:     Cervical back: No tenderness.  Lymphadenopathy:     Cervical: No cervical adenopathy.  Skin:    General: Skin is warm and dry.     Capillary Refill: Capillary refill takes less than 2 seconds.  Neurological:     General: No focal deficit present.     Mental Status: He is alert and oriented to person, place, and time.  Psychiatric:        Mood and Affect: Mood normal.        Behavior: Behavior normal.      ASSESSMENT & PLAN:  A total of 46 minutes was spent with the patient and counseling/coordination of care regarding preparing for this visit, review of most recent office visit notes, review of multiple chronic medical conditions and their management, review of all medications, review of most recent  bloodwork results, review of health maintenance items, education on nutrition, prognosis, documentation, and need for follow up.   Problem List Items Addressed This Visit       Cardiovascular and Mediastinum   CAD- LAD BMS '97, RCA DES 08/02/13 (Chronic)   Stable and well-controlled. No recent use of sublingual nitroglycerin . No recent anginal episodes.       Essential hypertension - Primary (Chronic)   BP Readings from Last 3 Encounters:  09/09/23 128/86  08/26/23 120/82  06/11/23 138/83  Well-controlled hypertension. Continue olmesartan  20 mg daily       Relevant Orders   Comprehensive metabolic panel with GFR   CBC with Differential/Platelet   Peripheral arterial disease (HCC)   Stable.  Presently on baby aspirin .  No longer taking Plavix.        Respiratory   OSA on CPAP   Stable.      Moderate persistent asthma, uncomplicated   Stable.  Continues Breztri  twice a day        Digestive   Gastroesophageal reflux disease   Clinically stable. Continues omeprazole  20 mg as needed        Genitourinary   BPH with obstruction/lower urinary tract symptoms   Stable. Continues Rapaflo 8 mg daily  Also taking Gemtesa 75 mg daily        Other   Dyslipidemia   Chronic stable condition Continue atorvastatin  40 mg daily Also taking Repatha  140 mg every 14 days      Relevant Orders   Lipoprotein A (LPA)   Lipid panel   Comprehensive metabolic panel with GFR   Intermittent claudication (HCC)   Much improved after stenting.       Patient Instructions  Health Maintenance After Age 16 After age 39, you are at a higher risk for certain long-term diseases and infections as well as injuries from falls. Falls are a major cause of broken bones and head injuries in people who are older than age 71. Getting regular preventive care can help to keep you healthy and well. Preventive care includes getting regular testing and making lifestyle changes as recommended by your  health care provider. Talk with your health care provider about: Which screenings and tests you should have. A screening is a test that checks for a disease when you have no symptoms. A diet and exercise plan that is right for you. What should I know about screenings and tests to prevent falls? Screening and testing are the best ways to find a health problem early. Early diagnosis and treatment give you the best chance of managing medical conditions that are common after age 19. Certain conditions and lifestyle choices may make you more likely to have a fall. Your health care provider may recommend: Regular vision checks. Poor vision and conditions such as cataracts can make you more likely to have a fall. If you wear glasses, make sure to get your prescription updated if your vision changes. Medicine review. Work with your health care provider to regularly review all of the medicines you are taking, including over-the-counter medicines. Ask your health care provider about any side effects that may make you more likely to have a fall. Tell your health care provider if any medicines that you take  make you feel dizzy or sleepy. Strength and balance checks. Your health care provider may recommend certain tests to check your strength and balance while standing, walking, or changing positions. Foot health exam. Foot pain and numbness, as well as not wearing proper footwear, can make you more likely to have a fall. Screenings, including: Osteoporosis screening. Osteoporosis is a condition that causes the bones to get weaker and break more easily. Blood pressure screening. Blood pressure changes and medicines to control blood pressure can make you feel dizzy. Depression screening. You may be more likely to have a fall if you have a fear of falling, feel depressed, or feel unable to do activities that you used to do. Alcohol use screening. Using too much alcohol can affect your balance and may make you more  likely to have a fall. Follow these instructions at home: Lifestyle Do not drink alcohol if: Your health care provider tells you not to drink. If you drink alcohol: Limit how much you have to: 0-1 drink a day for women. 0-2 drinks a day for men. Know how much alcohol is in your drink. In the [place] one drink equals one 12 oz bottle of beer (355 mL), one 5 oz glass of wine (148 mL), or one 1 oz glass of hard liquor (44 mL). Do not use any products that contain nicotine or tobacco. These products include cigarettes, chewing tobacco, and vaping devices, such as e-cigarettes. If you need help quitting, ask your health care provider. Activity  Follow a regular exercise program to stay fit. This will help you maintain your balance. Ask your health care provider what types of exercise are appropriate for you. If you need a cane or walker, use it as recommended by your health care provider. Wear supportive shoes that have nonskid soles. Safety  Remove any tripping hazards, such as rugs, cords, and clutter. Install safety equipment such as grab bars in bathrooms and safety rails on stairs. Keep rooms and walkways well-lit. General instructions Talk with your health care provider about your risks for falling. Tell your health care provider if: You fall. Be sure to tell your health care provider about all falls, even ones that seem minor. You feel dizzy, tiredness (fatigue), or off-balance. Take over-the-counter and prescription medicines only as told by your health care provider. These include supplements. Eat a healthy diet and maintain a healthy weight. A healthy diet includes low-fat dairy products, low-fat (lean) meats, and fiber from whole grains, beans, and lots of fruits and vegetables. Stay current with your vaccines. Schedule regular health, dental, and eye exams. Summary Having a healthy lifestyle and getting preventive care can help to protect your health and wellness after age  57. Screening and testing are the best way to find a health problem early and help you avoid having a fall. Early diagnosis and treatment give you the best chance for managing medical conditions that are more common for people who are older than age 83. Falls are a major cause of broken bones and head injuries in people who are older than age 50. Take precautions to prevent a fall at home. Work with your health care provider to learn what changes you can make to improve your health and wellness and to prevent falls. This information is not intended to replace advice given to you by your health care provider. Make sure you discuss any questions you have with your health care provider. Document Revised: 09/23/2020 Document Reviewed: 09/23/2020 Elsevier Patient Education  2024 Elsevier  Inc.    Maryagnes Small, MD [facility]

## 2023-09-09 NOTE — Assessment & Plan Note (Signed)
Stable and well-controlled.  No recent use of sublingual nitroglycerin.  No recent anginal episodes.

## 2023-09-09 NOTE — Assessment & Plan Note (Signed)
 Stable. Continues Rapaflo 8 mg daily  Also taking Gemtesa 75 mg daily

## 2023-09-09 NOTE — Assessment & Plan Note (Signed)
Stable.  Continues Breztri twice a day

## 2023-09-09 NOTE — Assessment & Plan Note (Signed)
 Stable.  Presently on baby aspirin .  No longer taking Plavix.

## 2023-09-09 NOTE — Assessment & Plan Note (Signed)
 Clinically stable. Continues omeprazole  20 mg as needed

## 2023-09-09 NOTE — Assessment & Plan Note (Signed)
 Stable

## 2023-09-09 NOTE — Assessment & Plan Note (Signed)
 Much improved after stenting.

## 2023-09-09 NOTE — Assessment & Plan Note (Signed)
 Chronic stable condition Continue atorvastatin  40 mg daily Also taking Repatha  140 mg every 14 days

## 2023-09-09 NOTE — Assessment & Plan Note (Signed)
 BP Readings from Last 3 Encounters:  09/09/23 128/86  08/26/23 120/82  06/11/23 138/83  Well-controlled hypertension. Continue olmesartan  20 mg daily

## 2023-09-13 ENCOUNTER — Other Ambulatory Visit: Payer: Self-pay | Admitting: Emergency Medicine

## 2023-09-13 DIAGNOSIS — I10 Essential (primary) hypertension: Secondary | ICD-10-CM

## 2023-09-13 NOTE — Telephone Encounter (Signed)
 Copied from CRM [id number]. Topic: Clinical - Medication Refill >> Sep 13, 2023 11:53 AM Howard Macho wrote: Most Recent Primary Care Visit:  Provider: Elvira Hammersmith  Department: [facility]  Visit Type: OFFICE VISIT  Date: 09/09/2023  Medication: olmesartan  (BENICAR ) 20 MG tablet  Has the patient contacted their pharmacy? No (Agent: If no, request that the patient contact the pharmacy for the refill. If patient does not wish to contact the pharmacy document the reason why and proceed with request.) (Agent: If yes, when and what did the pharmacy advise?)  Is this the correct pharmacy for this prescription? Yes If no, delete pharmacy and type the correct one.  This is the patient's preferred pharmacy:   [facility] - [place], [place] - [address] [place] [address] Phone: [phone] Fax: [phone]   Has the prescription been filled recently? No  Is the patient out of the medication? No  Has the patient been seen for an appointment in the last year OR does the patient have an upcoming appointment? Yes  Can we respond through MyChart? Yes  Agent: Please be advised that Rx refills may take up to 3 business days. We ask that you follow-up with your pharmacy.

## 2023-09-15 ENCOUNTER — Encounter (HOSPITAL_BASED_OUTPATIENT_CLINIC_OR_DEPARTMENT_OTHER): Payer: Self-pay | Admitting: Internal Medicine

## 2023-09-15 ENCOUNTER — Encounter: Payer: Self-pay | Admitting: Emergency Medicine

## 2023-09-15 LAB — LIPOPROTEIN A (LPA): Lipoprotein (a): 129 nmol/L — ABNORMAL HIGH (ref <75)

## 2023-09-15 NOTE — Telephone Encounter (Signed)
 Needs to discuss this with cardiologist who started him on Repatha .  Thanks.

## 2023-09-17 MED ORDER — OLMESARTAN MEDOXOMIL 20 MG PO TABS: 20.0000 mg | ORAL_TABLET | Freq: Every day | ORAL | 3 refills | Status: AC

## 2023-09-21 ENCOUNTER — Encounter: Payer: Self-pay | Admitting: Neurology

## 2023-09-21 ENCOUNTER — Encounter (HOSPITAL_COMMUNITY): Payer: Self-pay | Admitting: Neurology

## 2023-09-22 ENCOUNTER — Other Ambulatory Visit (HOSPITAL_COMMUNITY): Payer: Self-pay | Admitting: Neurology

## 2023-09-22 DIAGNOSIS — R251 Tremor, unspecified: Secondary | ICD-10-CM

## 2023-09-27 ENCOUNTER — Other Ambulatory Visit: Payer: Self-pay | Admitting: Family

## 2023-10-08 ENCOUNTER — Other Ambulatory Visit (HOSPITAL_COMMUNITY)

## 2023-10-08 ENCOUNTER — Encounter (HOSPITAL_COMMUNITY): Admission: RE | Admit: 2023-10-08 | Source: Ambulatory Visit

## 2023-10-18 DIAGNOSIS — H2513 Age-related nuclear cataract, bilateral: Secondary | ICD-10-CM | POA: Diagnosis not present

## 2023-10-18 DIAGNOSIS — I10 Essential (primary) hypertension: Secondary | ICD-10-CM | POA: Diagnosis not present

## 2023-10-18 DIAGNOSIS — H40013 Open angle with borderline findings, low risk, bilateral: Secondary | ICD-10-CM | POA: Diagnosis not present

## 2023-10-19 ENCOUNTER — Encounter (HOSPITAL_COMMUNITY)
Admission: RE | Admit: 2023-10-19 | Discharge: 2023-10-19 | Disposition: A | Discharge status: Home / Self Care | Source: Ambulatory Visit | Attending: Neurology | Admitting: Neurology

## 2023-10-19 DIAGNOSIS — R251 Tremor, unspecified: Secondary | ICD-10-CM | POA: Insufficient documentation

## 2023-10-19 DIAGNOSIS — R413 Other amnesia: Secondary | ICD-10-CM | POA: Diagnosis not present

## 2023-10-19 MED ORDER — POTASSIUM IODIDE (ANTIDOTE) 130 MG PO TABS
ORAL_TABLET | ORAL | Status: AC
Start: 2023-10-19 — End: ?
  Filled 2023-10-19: qty 1

## 2023-10-19 MED ORDER — IOFLUPANE I 123 185 MBQ/2.5ML IV SOLN
4.8000 | Freq: Once | INTRAVENOUS | Status: AC | PRN
  Administered 2023-10-19: 4.8 via INTRAVENOUS
  Filled 2023-10-19: qty 5

## 2023-10-25 DIAGNOSIS — I10 Essential (primary) hypertension: Secondary | ICD-10-CM | POA: Diagnosis not present

## 2023-10-25 DIAGNOSIS — H2513 Age-related nuclear cataract, bilateral: Secondary | ICD-10-CM | POA: Diagnosis not present

## 2023-10-25 DIAGNOSIS — H40013 Open angle with borderline findings, low risk, bilateral: Secondary | ICD-10-CM | POA: Diagnosis not present

## 2023-10-25 DIAGNOSIS — H40011 Open angle with borderline findings, low risk, right eye: Secondary | ICD-10-CM | POA: Diagnosis not present

## 2023-11-23 ENCOUNTER — Ambulatory Visit (INDEPENDENT_AMBULATORY_CARE_PROVIDER_SITE_OTHER)

## 2023-11-23 VITALS — Ht 74.0 in | Wt 231.0 lb

## 2023-11-23 DIAGNOSIS — Z0000 Encounter for general adult medical examination without abnormal findings: Secondary | ICD-10-CM | POA: Diagnosis not present

## 2023-11-23 NOTE — Progress Notes (Signed)
 Subjective:   Adam Peters is a 74 y.o. who presents for a Medicare Wellness preventive visit.  As a reminder, Annual Wellness Visits don't include a physical exam, and some assessments may be limited, especially if this visit is performed virtually. We may recommend an in-person follow-up visit with your provider if needed.  Visit Complete: Virtual I connected with  Adam Peters on 11/23/23 by a audio enabled telemedicine application and verified that I am speaking with the correct person using two identifiers.  Patient Location: Skilled Nursing Facility  Provider Location: Home Office  I discussed the limitations of evaluation and management by telemedicine. The patient expressed understanding and agreed to proceed.  Vital Signs: Because this visit was a virtual/telehealth visit, some criteria may be missing or patient reported. Any vitals not documented were not able to be obtained and vitals that have been documented are patient reported.  VideoDeclined- This patient declined Librarian, academic. Therefore the visit was completed with audio only.  Persons Participating in Visit: Patient.  AWV Questionnaire: No: Patient Medicare AWV questionnaire was not completed prior to this visit.  Cardiac Risk Factors include: advanced age (>50men, >5 women);male gender;hypertension;dyslipidemia;Other (see comment), Risk factor comments: PAD OSA     Objective:    Today's Vitals   11/23/23 1254  Weight: 231 lb (104.8 kg)  Height: 6' 2 (1.88 m)   Body mass index is 29.66 kg/m.     11/23/2023    1:03 PM 06/11/2023    9:56 AM 11/16/2022   12:31 PM 03/13/2022   10:15 AM 10/04/2020    2:30 PM 09/15/2019    9:09 AM 01/11/2018   11:16 PM  Advanced Directives  Does Patient Have a Medical Advance Directive? No No Yes No No No   Type of Surveyor, minerals;Living will      Copy of Healthcare Power of Attorney in Chart?   No  - copy requested      Would patient like information on creating a medical advance directive?    No - Patient declined No - Patient declined Yes (ED - Information included in AVS) No - Patient declined      Data saved with a previous flowsheet row definition    Current Medications (verified) Outpatient Encounter Medications as of 11/23/2023  Medication Sig   ARIPiprazole (ABILIFY) 5 MG tablet Take 5 mg by mouth daily.   aspirin  EC 81 MG tablet Take 81 mg by mouth daily.   atorvastatin  (LIPITOR) 40 MG tablet TAKE 1 TABLET DAILY   camphor-menthol (SARNA) lotion Apply 1 Application topically as needed.   Cholecalciferol (VITAMIN D) 50 MCG (2000 UT) tablet Take by mouth.   clobetasol ointment (TEMOVATE) 0.05 % Apply 1 Application topically 2 (two) times daily.   escitalopram (LEXAPRO) 20 MG tablet Take 25 mg by mouth daily.   ferrous fumarate (HEMOCYTE - 106 MG FE) 325 (106 Fe) MG TABS tablet Take 1 tablet by mouth daily.   fexofenadine  (ALLEGRA ) 180 MG tablet TAKE 1 TABLET DAILY AS NEEDED FOR ALLERGIES OR RHINITIS (CAN TAKE EXTRA DOSE DURING FLARE UPS)   nitroGLYCERIN  (NITROSTAT ) 0.4 MG SL tablet DISSOLVE 1 TABLET UNDER THE TONGUE EVERY 5 MINUTES AS NEEDED FOR CHEST PAIN (SCHEDULE AN APPOINTMENT FOR FUTURE REFILLS)   olmesartan  (BENICAR ) 20 MG tablet Take 1 tablet (20 mg total) by mouth daily.   omeprazole  (PRILOSEC) 20 MG capsule TAKE 2 CAPSULES TWICE A DAY BEFORE MEALS   REPATHA   SURECLICK 140 MG/ML SOAJ INJECT 140 MG UNDER THE SKIN EVERY 14 DAYS   silodosin (RAPAFLO) 8 MG CAPS capsule Take 8 mg by mouth daily.   STIOLTO RESPIMAT  2.5-2.5 MCG/ACT AERS Inhale two puffs once daily   Vibegron (GEMTESA) 75 MG TABS    fluticasone  (FLONASE ) 50 MCG/ACT nasal spray Use 2 sprays in each nostril once a day as needed for stuffy nose (Patient not taking: Reported on 11/23/2023)   No facility-administered encounter medications on file as of 11/23/2023.    Allergies (verified) Amoxicillin    History: Past  Medical History:  Diagnosis Date   Allergy     seasonal   Anemia    Anxiety    Asthma    CAD (coronary artery disease)    a. 1997 PCI/BMS to distal LAD (J&J PS BMS);  b. 03/2011 Neg MV;  c. 07/2013 NSTEMI/PCI: LM 20d, LAD 20m, 50d isr, LCX nl, OM1/2 nl, RCA dom 95-52m (3.5x16 Promus premier DES), EF 55%, mild basal inf HK.   Cataract    Coronary artery disease    s/p PCI to LAD (1997) - Dr. RONAL Ly   GERD (gastroesophageal reflux disease)    Hepatitis C    History of tobacco use    Hyperlipidemia    Hypertension    Schizophrenia (HCC) 1978   Past Surgical History:  Procedure Laterality Date   APPENDECTOMY  1965   CARDIAC CATHETERIZATION  2001   patent LAD stent (Dr MITZIE)   CORONARY ANGIOPLASTY WITH STENT PLACEMENT  06/09/1995   prox LAD stenting 80% to 0% (Dr. RONAL Ly) - repeat cath in 11/1999 showed no significant coronary disease & normal systolic function (Dr. FABIENE Hasten)   CORONARY STENT PLACEMENT  08/02/2013   RCA   DES         DR COOPER   CORONARY STENT PLACEMENT  07/16/2022   HERNIA REPAIR  1994   knife wound repair  1969   LEFT HEART CATHETERIZATION WITH CORONARY ANGIOGRAM N/A 08/02/2013   Procedure: LEFT HEART CATHETERIZATION WITH CORONARY ANGIOGRAM;  Surgeon: Adam JONETTA Fell, MD;  Location: [facility] CATH LAB;  Service: Cardiovascular;  Laterality: N/A;   TRANSTHORACIC ECHOCARDIOGRAM  11/13/2008   EF 50-55%, normal LV systolic function; RA mildly dilated; trace MR/TR/PR   Family History  Problem Relation Age of Onset   Asthma Mother    Crohn's disease Mother    Heart disease Mother    Hepatitis C Brother    Testicular cancer Brother    Dementia Father    Diabetes Maternal Grandmother    Emphysema Maternal Aunt    Cancer Maternal Aunt    Colon cancer Neg Hx    Social History   Socioeconomic History   Marital status: Married    Spouse name: Rock   Number of children: Not on file   Years of education: B.A.   Highest education level: Not on file  Occupational  History   Occupation: retired    Associate Professor: [employer]  Tobacco Use   Smoking status: Former    Current packs/day: 0.00    Types: Cigarettes    Quit date: 05/18/1993    Years since quitting: 30.5   Smokeless tobacco: Never  Vaping Use   Vaping status: Never Used  Substance and Sexual Activity   Alcohol use: Yes    Alcohol/week: 3.0 standard drinks of alcohol    Types: 3 Glasses of wine per week    Comment: SOCIAL   Drug use: No   Sexual activity:  Not Currently  Other Topics Concern   Not on file  Social History Narrative   Cafffeine occasional.  Education 4 yr college.  WorK: retired.     Lives with wife/2025   Social Drivers of Health   Financial Resource Strain: Low Risk  (11/23/2023)   Overall Financial Resource Strain (CARDIA)    Difficulty of Paying Living Expenses: Not very hard  Food Insecurity: No Food Insecurity (11/23/2023)   Hunger Vital Sign    Worried About Running Out of Food in the Last Year: Never true    Ran Out of Food in the Last Year: Never true  Transportation Needs: No Transportation Needs (11/23/2023)   PRAPARE - Administrator, Civil Service (Medical): No    Lack of Transportation (Non-Medical): No  Physical Activity: Sufficiently Active (11/23/2023)   Exercise Vital Sign    Days of Exercise per Week: 5 days    Minutes of Exercise per Session: 90 min  Stress: No Stress Concern Present (11/23/2023)   [employer] of Occupational Health - Occupational Stress Questionnaire    Feeling of Stress: Not at all  Social Connections: Moderately Isolated (11/23/2023)   Social Connection and Isolation Panel    Frequency of Communication with Friends and Family: Twice a week    Frequency of Social Gatherings with Friends and Family: More than three times a week    Attends Religious Services: Never    Database administrator or Organizations: No    Attends Engineer, structural: Never    Marital Status: Married    Tobacco  Counseling Counseling given: Not Answered    Clinical Intake:  Pre-visit preparation completed: Yes  Pain : No/denies pain     BMI - recorded: 29.66 Nutritional Status: BMI 25 -29 Overweight Nutritional Risks: None Diabetes: No  Lab Results  Component Value Date   HGBA1C 4.4 (L) 01/11/2018   HGBA1C 5.1 01/29/2011     How often do you need to have someone help you when you read instructions, pamphlets, or other written materials from your doctor or pharmacy?: 1 - Never  Interpreter Needed?: No  Information entered by :: Matteo Banke, RMA   Activities of Daily Living     11/23/2023   12:58 PM  In your present state of health, do you have any difficulty performing the following activities:  Hearing? 0  Vision? 0  Difficulty concentrating or making decisions? 0  Walking or climbing stairs? 0  Dressing or bathing? 0  Doing errands, shopping? 0  Preparing Food and eating ? N  Using the Toilet? N  In the past six months, have you accidently leaked urine? N  Do you have problems with loss of bowel control? N  Managing your Medications? N  Managing your Finances? N  Housekeeping or managing your Housekeeping? N    Patient Care Team: Purcell Emil Schanz, MD as PCP - General (Internal Medicine) [name], MD as PCP - Cardiology (Cardiology) Purcell Emil Schanz, MD (Internal Medicine) Livingston Rigg, MD (Inactive) as Consulting Physician (Dermatology) Austin Olam CROME, MD as Consulting Physician (Ophthalmology)  I have updated your Care Teams any recent Medical Services you may have received from other providers in the past year.     Assessment:   This is a routine wellness examination for Adam Peters.  Hearing/Vision screen Hearing Screening - Comments:: Denies hearing difficulties   Vision Screening - Comments:: Wears eyeglasses/Dr. Austin    Goals Addressed  This Visit's Progress    Stay strong and healthy.   On track      Depression     11/23/2023    1:05 PM 09/09/2023    3:16 PM 11/16/2022   12:34 PM 11/16/2022   12:30 PM 09/01/2022    3:25 PM 05/20/2022   10:25 AM 04/13/2022    3:16 PM  PHQ 2/9 Scores  PHQ - 2 Score 0 0 3 3 0 0 0  PHQ- 9 Score 0  3 3       Fall Risk     11/23/2023    1:03 PM 09/09/2023    3:15 PM 11/16/2022   12:32 PM 11/16/2022    6:48 AM 09/01/2022    3:25 PM  Fall Risk   Falls in the past year? 0 0 1 1 1   Number falls in past yr: 0 0 0 0 0  Injury with Fall? 0 0 1 1 1   Risk for fall due to :  No Fall Risks History of fall(s)  Impaired balance/gait  Follow up Falls evaluation completed;Falls prevention discussed Falls evaluation completed Falls prevention discussed  Falls evaluation completed    MEDICARE RISK AT HOME:  Medicare Risk at Home Any stairs in or around the home?: Yes If so, are there any without handrails?: No Home free of loose throw rugs in walkways, pet beds, electrical cords, etc?: Yes Adequate lighting in your home to reduce risk of falls?: Yes Life alert?: No Use of a cane, walker or w/c?: No Grab bars in the bathroom?: No Shower chair or bench in shower?: Yes Elevated toilet seat or a handicapped toilet?: Yes  TIMED UP AND GO:  Was the test performed?  No  Cognitive Function: Declined/Normal: No cognitive concerns noted by patient or family. Patient alert, oriented, able to answer questions appropriately and recall recent events. No signs of memory loss or confusion.        11/16/2022   12:34 PM 11/12/2021    9:07 AM 09/15/2019    9:10 AM  6CIT Screen  What Year? 0 points 0 points 0 points  What month? 0 points 0 points 0 points  What time? 0 points 0 points 0 points  Count back from 20 0 points 0 points 0 points  Months in reverse 0 points 0 points 0 points  Repeat phrase 0 points 0 points 0 points  Total Score 0 points 0 points 0 points    Immunizations Immunization History  Administered Date(s) Administered   Fluad Quad(high Dose 65+) 01/31/2019, 01/14/2021,  06/30/2021, 02/24/2022   Influenza Split 03/12/2012, 04/04/2013   Influenza, High Dose Seasonal PF 02/24/2018, 02/12/2023   Influenza,inj,Quad PF,6+ Mos 03/11/2016, 03/01/2017   Influenza,inj,quad, With Preservative 02/15/2018   Influenza-Unspecified 01/17/2011, 04/04/2013, 02/05/2014, 02/07/2015, 06/20/2019, 02/16/2020, 03/13/2020   Moderna Covid-19 Fall Seasonal Vaccine 60yrs & older 02/12/2023   Moderna Covid-19 Vaccine Bivalent Booster 38yrs & up 03/21/2021   Moderna Sars-Covid-2 Vaccination 06/20/2019, 07/17/2019, 08/17/2019, 03/18/2020, 08/29/2020   Pneumococcal Conjugate-13 10/22/2006, 11/29/2014   Pneumococcal Polysaccharide-23 04/04/2013, 06/07/2018, 12/26/2021   Rsv, Bivalent, Protein Subunit Rsvpref,pf Marlow) 08/11/2022   Td (Adult) 06/09/2016   Tdap 07/08/2012, 12/07/2016   Unspecified SARS-COV-2 Vaccination 02/24/2022   Zoster Recombinant(Shingrix) 07/03/2020, 11/21/2020    Screening Tests Health Maintenance  Topic Date Due   COVID-19 Vaccine (9 - 2024-25 season) 08/12/2023   INFLUENZA VACCINE  12/17/2023   Medicare Annual Wellness (AWV)  11/22/2024   Colonoscopy  11/04/2026   DTaP/Tdap/Td (4 - Td or  Tdap) 12/08/2026   Pneumococcal Vaccine: 50+ Years  Completed   Hepatitis C Screening  Completed   Zoster Vaccines- Shingrix  Completed   Hepatitis B Vaccines  Aged Out   HPV VACCINES  Aged Out   Meningococcal B Vaccine  Aged Out    Health Maintenance  Health Maintenance Due  Topic Date Due   COVID-19 Vaccine (9 - 2024-25 season) 08/12/2023   Health Maintenance Items Addressed: See Nurse Notes at the end of this note  Additional Screening:  Vision Screening: Recommended annual ophthalmology exams for early detection of glaucoma and other disorders of the eye. Would you like a referral to an eye doctor? No    Dental Screening: Recommended annual dental exams for proper oral hygiene  Community Resource Referral / Chronic Care Management: CRR required this  visit?  No   CCM required this visit?  No   Plan:    I have personally reviewed and noted the following in the patient's chart:   Medical and social history Use of alcohol, tobacco or illicit drugs  Current medications and supplements including opioid prescriptions. Patient is not currently taking opioid prescriptions. Functional ability and status Nutritional status Physical activity Advanced directives List of other physicians Hospitalizations, surgeries, and ER visits in previous 12 months Vitals Screenings to include cognitive, depression, and falls Referrals and appointments  In addition, I have reviewed and discussed with patient certain preventive protocols, quality metrics, and best practice recommendations. A written personalized care plan for preventive services as well as general preventive health recommendations were provided to patient.   Jasenia Weilbacher L Joscelyn Hardrick, CMA   11/23/2023   After Visit Summary: (MyChart) Due to this being a telephonic visit, the after visit summary with patients personalized plan was offered to patient via MyChart   Notes: Nothing significant to report at this time.

## 2023-11-23 NOTE — Patient Instructions (Signed)
 Adam Peters , Thank you for taking time out of your busy schedule to complete your Annual Wellness Visit with me. I enjoyed our conversation and look forward to speaking with you again next year. I, as well as your care team,  appreciate your ongoing commitment to your health goals. Please review the following plan we discussed and let me know if I can assist you in the future. Your Game plan/ To Do List   Follow up Visits: Next Medicare AWV with our clinical staff: 11/23/2024.   Have you seen your provider in the last 6 months (3 months if uncontrolled diabetes)? Yes Next Office Visit with your provider: 03/13/2024.  Clinician Recommendations:  Aim for 30 minutes of exercise or brisk walking, 6-8 glasses of water, and 5 servings of fruits and vegetables each day. Keep up the good work.      This is a list of the screening recommended for you and due dates:  Health Maintenance  Topic Date Due   COVID-19 Vaccine (9 - 2024-25 season) 08/12/2023   Flu Shot  12/17/2023   Medicare Annual Wellness Visit  11/22/2024   Colon Cancer Screening  11/04/2026   DTaP/Tdap/Td vaccine (4 - Td or Tdap) 12/08/2026   Pneumococcal Vaccine for age over 19  Completed   Hepatitis C Screening  Completed   Zoster (Shingles) Vaccine  Completed   Hepatitis B Vaccine  Aged Out   HPV Vaccine  Aged Out   Meningitis B Vaccine  Aged Out    Advanced directives: (Declined) Advance directive discussed with you today. Even though you declined this today, please call our office should you change your mind, and we can give you the proper paperwork for you to fill out. Advance Care Planning is important because it:  [x]  Makes sure you receive the medical care that is consistent with your values, goals, and preferences  [x]  It provides guidance to your family and loved ones and reduces their decisional burden about whether or not they are making the right decisions based on your wishes.  Follow the link provided in your  after visit summary or read over the paperwork we have mailed to you to help you started getting your Advance Directives in place. If you need assistance in completing these, please reach out to us  so that we can help you!  See attachments for Preventive Care and Fall Prevention Tips.

## 2023-11-29 DIAGNOSIS — H2513 Age-related nuclear cataract, bilateral: Secondary | ICD-10-CM | POA: Diagnosis not present

## 2023-11-29 DIAGNOSIS — H40013 Open angle with borderline findings, low risk, bilateral: Secondary | ICD-10-CM | POA: Diagnosis not present

## 2023-11-29 DIAGNOSIS — I10 Essential (primary) hypertension: Secondary | ICD-10-CM | POA: Diagnosis not present

## 2023-12-06 ENCOUNTER — Ambulatory Visit: Admitting: Emergency Medicine

## 2023-12-07 ENCOUNTER — Encounter: Payer: Self-pay | Admitting: Psychology

## 2023-12-07 ENCOUNTER — Encounter: Attending: Psychology | Admitting: Psychology

## 2023-12-07 DIAGNOSIS — R41840 Attention and concentration deficit: Secondary | ICD-10-CM | POA: Diagnosis not present

## 2023-12-07 DIAGNOSIS — F09 Unspecified mental disorder due to known physiological condition: Secondary | ICD-10-CM

## 2023-12-07 DIAGNOSIS — G4733 Obstructive sleep apnea (adult) (pediatric): Secondary | ICD-10-CM | POA: Diagnosis not present

## 2023-12-07 DIAGNOSIS — R4689 Other symptoms and signs involving appearance and behavior: Secondary | ICD-10-CM

## 2023-12-07 DIAGNOSIS — R413 Other amnesia: Secondary | ICD-10-CM
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
## 2023-12-16 ENCOUNTER — Ambulatory Visit: Payer: Medicare Other | Admitting: Psychology

## 2023-12-23 ENCOUNTER — Other Ambulatory Visit: Payer: Self-pay | Admitting: Internal Medicine

## 2023-12-29 ENCOUNTER — Encounter: Payer: Medicare Other | Attending: Psychology | Admitting: Psychology

## 2023-12-29 ENCOUNTER — Encounter: Payer: Self-pay | Admitting: Psychology

## 2023-12-29 ENCOUNTER — Telehealth: Payer: Self-pay | Admitting: Internal Medicine

## 2023-12-29 DIAGNOSIS — F09 Unspecified mental disorder due to known physiological condition: Secondary | ICD-10-CM | POA: Insufficient documentation

## 2023-12-29 DIAGNOSIS — G4733 Obstructive sleep apnea (adult) (pediatric): Secondary | ICD-10-CM | POA: Insufficient documentation

## 2023-12-29 DIAGNOSIS — Z87820 Personal history of traumatic brain injury: Secondary | ICD-10-CM | POA: Diagnosis not present

## 2023-12-29 DIAGNOSIS — R413 Other amnesia: Secondary | ICD-10-CM | POA: Diagnosis not present

## 2023-12-29 NOTE — Telephone Encounter (Signed)
 Already filled, pt need appt

## 2023-12-29 NOTE — Telephone Encounter (Signed)
*  STAT* If patient is at the pharmacy, call can be transferred to refill team.   1. Which medications need to be refilled? (please list name of each medication and dose if known) atorvastatin  (LIPITOR) 40 MG tablet   2. Which pharmacy/location (including street and city if local pharmacy) is medication to be sent to? [facility] - [place], [place] - [address]    3. Do they need a 30 day or 90 day supply? 90
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
## 2024-02-13 ENCOUNTER — Other Ambulatory Visit: Payer: Self-pay | Admitting: Internal Medicine

## 2024-02-15 ENCOUNTER — Other Ambulatory Visit: Payer: Self-pay

## 2024-02-15 ENCOUNTER — Emergency Department (HOSPITAL_BASED_OUTPATIENT_CLINIC_OR_DEPARTMENT_OTHER)
Admission: EM | Admit: 2024-02-15 | Discharge: 2024-02-15 | Disposition: A | Discharge status: Home / Self Care | Attending: Emergency Medicine | Admitting: Emergency Medicine

## 2024-02-15 ENCOUNTER — Encounter (HOSPITAL_BASED_OUTPATIENT_CLINIC_OR_DEPARTMENT_OTHER): Payer: Self-pay

## 2024-02-15 ENCOUNTER — Emergency Department (HOSPITAL_BASED_OUTPATIENT_CLINIC_OR_DEPARTMENT_OTHER)

## 2024-02-15 DIAGNOSIS — M5134 Other intervertebral disc degeneration, thoracic region: Secondary | ICD-10-CM | POA: Diagnosis not present

## 2024-02-15 DIAGNOSIS — I10 Essential (primary) hypertension: Secondary | ICD-10-CM | POA: Insufficient documentation

## 2024-02-15 DIAGNOSIS — I2510 Atherosclerotic heart disease of native coronary artery without angina pectoris: Secondary | ICD-10-CM | POA: Diagnosis not present

## 2024-02-15 DIAGNOSIS — M5137 Other intervertebral disc degeneration, lumbosacral region with discogenic back pain only: Secondary | ICD-10-CM | POA: Diagnosis not present

## 2024-02-15 DIAGNOSIS — M5136 Other intervertebral disc degeneration, lumbar region with discogenic back pain only: Secondary | ICD-10-CM | POA: Diagnosis not present

## 2024-02-15 DIAGNOSIS — M5416 Radiculopathy, lumbar region: Secondary | ICD-10-CM

## 2024-02-15 DIAGNOSIS — M545 Low back pain, unspecified: Secondary | ICD-10-CM | POA: Insufficient documentation

## 2024-02-15 DIAGNOSIS — M5124 Other intervertebral disc displacement, thoracic region: Secondary | ICD-10-CM | POA: Diagnosis not present

## 2024-02-15 DIAGNOSIS — Z7982 Long term (current) use of aspirin: Secondary | ICD-10-CM | POA: Insufficient documentation

## 2024-02-15 DIAGNOSIS — M48061 Spinal stenosis, lumbar region without neurogenic claudication: Secondary | ICD-10-CM | POA: Diagnosis not present

## 2024-02-15 DIAGNOSIS — Z79899 Other long term (current) drug therapy: Secondary | ICD-10-CM | POA: Insufficient documentation

## 2024-02-15 DIAGNOSIS — M47816 Spondylosis without myelopathy or radiculopathy, lumbar region: Secondary | ICD-10-CM | POA: Diagnosis not present

## 2024-02-15 DIAGNOSIS — M47814 Spondylosis without myelopathy or radiculopathy, thoracic region: Secondary | ICD-10-CM | POA: Diagnosis not present

## 2024-02-15 DIAGNOSIS — M4804 Spinal stenosis, thoracic region: Secondary | ICD-10-CM | POA: Diagnosis not present

## 2024-02-15 LAB — CBC WITH DIFFERENTIAL/PLATELET
Abs Immature Granulocytes: 0.02 K/uL (ref 0.00–0.07)
Basophils Absolute: 0 K/uL (ref 0.0–0.1)
Basophils Relative: 0 %
Eosinophils Absolute: 0.1 K/uL (ref 0.0–0.5)
Eosinophils Relative: 2 %
HCT: 39.5 % (ref 39.0–52.0)
Hemoglobin: 12.7 g/dL — ABNORMAL LOW (ref 13.0–17.0)
Immature Granulocytes: 0 %
Lymphocytes Relative: 17 %
Lymphs Abs: 1 K/uL (ref 0.7–4.0)
MCH: 30.5 pg (ref 26.0–34.0)
MCHC: 32.2 g/dL (ref 30.0–36.0)
MCV: 94.7 fL (ref 80.0–100.0)
Monocytes Absolute: 0.6 K/uL (ref 0.1–1.0)
Monocytes Relative: 9 %
Neutro Abs: 4.3 K/uL (ref 1.7–7.7)
Neutrophils Relative %: 72 %
Platelets: 168 K/uL (ref 150–400)
RBC: 4.17 MIL/uL — ABNORMAL LOW (ref 4.22–5.81)
RDW: 12.4 % (ref 11.5–15.5)
WBC: 6.1 K/uL (ref 4.0–10.5)
nRBC: 0 % (ref 0.0–0.2)

## 2024-02-15 LAB — URINALYSIS, ROUTINE W REFLEX MICROSCOPIC
Bilirubin Urine: NEGATIVE
Glucose, UA: NEGATIVE mg/dL
Hgb urine dipstick: NEGATIVE
Ketones, ur: NEGATIVE mg/dL
Leukocytes,Ua: NEGATIVE
Nitrite: NEGATIVE
Protein, ur: NEGATIVE mg/dL
Specific Gravity, Urine: <=1.005 (ref 1.005–1.030)
pH: 6.5 (ref 5.0–8.0)

## 2024-02-15 LAB — BASIC METABOLIC PANEL WITH GFR
Anion gap: 9 (ref 5–15)
BUN: 19 mg/dL (ref 8–23)
CO2: 25 mmol/L (ref 22–32)
Calcium: 9.3 mg/dL (ref 8.9–10.3)
Chloride: 105 mmol/L (ref 98–111)
Creatinine, Ser: 1.19 mg/dL (ref 0.61–1.24)
GFR, Estimated: >60 mL/min (ref >60)
Glucose, Bld: 93 mg/dL (ref 70–99)
Potassium: 4 mmol/L (ref 3.5–5.1)
Sodium: 139 mmol/L (ref 135–145)

## 2024-02-15 MED ORDER — LIDOCAINE 5 % EX PTCH: 1.0000 | MEDICATED_PATCH | CUTANEOUS | 0 refills | Status: AC

## 2024-02-15 MED ORDER — LIDOCAINE 5 % EX PTCH
1.0000 | MEDICATED_PATCH | CUTANEOUS | Status: DC
  Administered 2024-02-15: 1 via TRANSDERMAL
  Filled 2024-02-15: qty 1

## 2024-02-15 MED ORDER — HYDROCODONE-ACETAMINOPHEN 5-325 MG PO TABS
1.0000 | ORAL_TABLET | Freq: Once | ORAL | Status: AC
  Administered 2024-02-15: 1 via ORAL
  Filled 2024-02-15: qty 1

## 2024-02-15 MED ORDER — HYDROCODONE-ACETAMINOPHEN 5-325 MG PO TABS: 1.0000 | ORAL_TABLET | ORAL | 0 refills | Status: DC | PRN

## 2024-02-15 MED ORDER — LORAZEPAM 1 MG PO TABS
0.5000 mg | ORAL_TABLET | ORAL | Status: AC | PRN
  Administered 2024-02-15: 0.5 mg via ORAL
  Filled 2024-02-15: qty 1

## 2024-02-15 MED ORDER — CYCLOBENZAPRINE HCL 5 MG PO TABS
5.0000 mg | ORAL_TABLET | Freq: Once | ORAL | Status: DC
  Filled 2024-02-15: qty 1

## 2024-02-15 MED ORDER — GADOBUTROL 1 MMOL/ML IV SOLN
10.0000 mL | Freq: Once | INTRAVENOUS | Status: AC | PRN
  Administered 2024-02-15: 10 mL via INTRAVENOUS

## 2024-02-15 NOTE — ED Notes (Signed)
 Pt aware repeat urine collection needed, provided with urine collection cup.

## 2024-02-15 NOTE — Discharge Instructions (Addendum)
 You were seen for your back pain in the emergency department.   At home, please use over-the-counter Tylenol  and lidocaine  patches.  You may also use the norco we have prescribed you as needed for pain.  Do not take the norco before driving or operating heavy machinery.    Follow-up with your primary doctor in 2-3 days regarding your visit.  Follow-up with the spine clinic as soon as possible regarding your symptoms.  Return immediately to the emergency department if you experience any of the following: Numbness or weakness of your legs, bowel or bladder incontinence, numbness while wiping after pooping or urinating, or any other concerning symptoms.    Thank you for visiting our Emergency Department. It was a pleasure taking care of you today.

## 2024-02-15 NOTE — ED Provider Notes (Signed)
 [place] EMERGENC[facility] DEPARTMENT AT [facility] Provider Note   CSN: [id number] Arrival date & time: 02/15/24  9076     Patient presents with: Back Pain   Adam Peters is a 73 y.o. male.    Back Pain    73 year old male with medical history significant for CAD, degenerative disc disease, HTN, HLD, PAD and intermittent claudication status post stenting in the lower extremities presenting to the Emergency Department with back pain.  The patient has had chronic back pain over the past year in his lower lumbar spine.  He states that over the past several weeks he has had worsening back pain.  He denies any recent.  He any fevers or chills.  He denies any IV drug use.  He has had radicular pain down the legs bilaterally and is feeling like his legs are slightly more weak.  He denies any urinary or fecal incontinence.  Prior to Admission medications   Medication Sig Start Date End Date Taking? Authorizing Provider  ARIPiprazole (ABILIFY) 5 MG tablet Take 5 mg by mouth daily. 04/23/23   [provider]  aspirin  EC 81 MG tablet Take 81 mg by mouth daily.    [provider]  atorvastatin  (LIPITOR) 40 MG tablet TAKE 1 TABLET DAILY (MUST SCHEDULE A FOLLOW UP APPOINTMENT FOR FURTHER REFILLS, [phone]) 02/14/24   [name], [name], MD  camphor-menthol [facility]) lotion Apply 1 Application topically as needed. 04/12/23   [provider]  Cholecalciferol (VITAMIN D) 50 MCG (2000 UT) tablet Take by mouth. 12/03/20   [provider]  clobetasol ointment (TEMOVATE) 0.05 % Apply 1 Application topically 2 (two) times daily. 04/12/23   [provider]  escitalopram (LEXAPRO) 20 MG tablet Take 25 mg by mouth daily.    [provider]  ferrous fumarate (HEMOCYTE - 106 MG FE) 325 (106 Fe) MG TABS tablet Take 1 tablet by mouth daily. 12/30/22   [provider]  fexofenadine  (ALLEGRA ) 180 MG tablet TAKE 1 TABLET DAILY AS NEEDED FOR  ALLERGIES OR RHINITIS (CAN TAKE EXTRA DOSE DURING FLARE UPS) 09/27/23   Cheryl Reusing, FNP  fluticasone  (FLONASE ) 50 MCG/ACT nasal spray Use 2 sprays in each nostril once a day as needed for stuffy nose Patient not taking: Reported on 11/23/2023 10/13/22   Cheryl Reusing, FNP  nitroGLYCERIN  (NITROSTAT ) 0.4 MG SL tablet DISSOLVE 1 TABLET UNDER THE TONGUE EVERY 5 MINUTES AS NEEDED FOR CHEST PAIN (SCHEDULE AN APPOINTMENT FOR FUTURE REFILLS) 10/20/22   [name], [name], MD  olmesartan  (BENICAR ) 20 MG tablet Take 1 tablet (20 mg total) by mouth daily. 09/17/23   Purcell Emil Schanz, MD  omeprazole  (PRILOSEC) 20 MG capsule TAKE 2 CAPSULES TWICE A DAY BEFORE MEALS 04/28/23   [name], MD  REPATHA  SURECLICK 140 MG/ML SOAJ INJECT 140 MG UNDER THE SKIN EVERY 14 DAYS 09/10/23   [name], [name], MD  silodosin (RAPAFLO) 8 MG CAPS capsule Take 8 mg by mouth daily. 09/19/21   [provider]  STIOLTO RESPIMAT  2.5-2.5 MCG/ACT AERS Inhale two puffs once daily 06/30/23   Iva Marty Saltness, MD  Vibegron [facility]) 75 MG TABS  03/25/22   [provider]    Allergies: Amoxicillin     Review of Systems  Musculoskeletal:  Positive for back pain.  All other systems reviewed and are negative.   Updated Vital Signs BP (!) 154/83   Pulse (!) 57   Temp 98 F (36.7 C) (Oral)   Resp 15  SpO2 97%   Physical Exam Vitals and nursing note reviewed.  Constitutional:      General: He is not in acute distress.    Appearance: He is well-developed.  HENT:     Head: Normocephalic and atraumatic.  Eyes:     Conjunctiva/sclera: Conjunctivae normal.  Cardiovascular:     Rate and Rhythm: Normal rate and regular rhythm.     Heart sounds: No murmur heard. Pulmonary:     Effort: Pulmonary effort is normal. No respiratory distress.     Breath sounds: Normal breath sounds.  Abdominal:     Palpations: Abdomen is soft.     Tenderness: There is no abdominal tenderness.  Musculoskeletal:         General: No swelling.     Cervical back: Neck supple.     Comments: Positive straight leg raise test bilaterally.  Midline tenderness to palpation in the lumbar spine, some paraspinal muscle tenderness bilaterally.  Skin:    General: Skin is warm and dry.     Capillary Refill: Capillary refill takes less than 2 seconds.  Neurological:     Mental Status: He is alert.     Comments: 5-5 strength in the bilateral upper extremities, 4-5 strength in the left lower extremity, 5 out of 5 strength in the right lower extremity.  Unable to obtain the prepatellar reflexes in the bilateral lower extremities.  Psychiatric:        Mood and Affect: Mood normal.     (all labs ordered are listed, but only abnormal results are displayed) Labs Reviewed  CBC WITH DIFFERENTIAL/PLATELET - Abnormal; Notable for the following components:      Result Value   RBC 4.17 (*)    Hemoglobin 12.7 (*)    All other components within normal limits  BASIC METABOLIC PANEL WITH GFR  URINALYSIS, ROUTINE W REFLEX MICROSCOPIC    EKG: None  Radiology: No results found.   Procedures   Medications Ordered in the ED  lidocaine  (LIDODERM ) 5 % 1 patch (1 patch Transdermal Patch Applied 02/15/24 1057)  cyclobenzaprine  (FLEXERIL ) tablet 5 mg (5 mg Oral Not Given 02/15/24 1056)  HYDROcodone -acetaminophen  (NORCO/VICODIN) 5-325 MG per tablet 1 tablet (1 tablet Oral Given 02/15/24 1056)  LORazepam (ATIVAN) tablet 0.5 mg (0.5 mg Oral Given 02/15/24 1357)                                    Medical Decision Making Amount and/or Complexity of Data Reviewed Labs: ordered. Radiology: ordered.  Risk Prescription drug management.    73 year old male with medical history significant for CAD, degenerative disc disease, HTN, HLD, PAD and intermittent claudication status post stenting in the lower extremities presenting to the Emergency Department with back pain.  The patient has had chronic back pain over the past year in his lower  lumbar spine.  He states that over the past several weeks he has had worsening back pain.  He denies any recent.  He any fevers or chills.  He denies any IV drug use.  He has had radicular pain down the legs bilaterally and is feeling like his legs are slightly more weak.  He denies any urinary or fecal incontinence.  On arrival, the patient was afebrile, not tachycardic or tachypneic, hemodynamically stable, saturating well on room air.  On exam, the patient had intact strength and sensation, slight left lower extremity weakness.  I was unable to obtain prepatellar  reflexes in the patient.  He denies urinary or fecal incontinence but in the setting of worsening back pain, will obtain MRI imaging of the thoracic and lumbar spine to further evaluate.  Suspect likely worsening acute on chronic back pain with associated sciatica.  Lower concern for cauda equina, low concern for epidural abscess, discitis or osteomyelitis however with absent reflexes and worsening pain imaging ordered.  Laboratory evaluation was obtained and revealed CBC without a leukocytosis, mild anemia to 12.7, BMP unremarkable, urinalysis collected and pending.  UA collected and pending.  The patient was administered Norco for pain control in addition to a lidocaine  patch.  Flexeril  was ordered.  MRI imaging pending at time of signout.  Signout given to Dr. Jakie at 1500 pending results of MRI, ultimate disposition pending results of diagnostic testing and reassessment.     Final diagnoses:  None    ED Discharge Orders     None          Jerrol Agent, MD 02/15/24 1404

## 2024-02-15 NOTE — ED Triage Notes (Signed)
 L sided lower back, radiates to BLE for 2 weeks.  Denies known injury, denies urinary symptoms.

## 2024-02-15 NOTE — ED Provider Notes (Signed)
  Physical Exam  BP (!) 149/90   Pulse (!) 50   Temp 97.9 F (36.6 C) (Oral)   Resp 16   SpO2 98%   Physical Exam  Procedures  Procedures  ED Course / MDM   Clinical Course as of 02/15/24 1827  Tue Feb 15, 2024  1500 Assumed care from Dr Jerrol. 73 yo M who presented with atruamatic back pain over 2 weeks. Worsened reflexes. LLE weakness at home. No incontinence. No hx of IVDU. Not AC.  [RP]  1757 MR Lumbar Spine W Wo Contrast  Mild-to-moderate left subarticular stenosis with some crowding of the descending left L5 nerve root. Moderate bilateral neural foraminal narrowing (greater on the left).  [RP]  8197 Patient reassessed.  Says that he is having some numbness on the lateral aspect of his left buttocks and thigh.  Says that his leg does feel like it is about to give out on him occasionally.  No bowel or bladder incontinence.  He has 5/5 strength in his right lower extremity.  His left lower extremity is 4/5 strength in hip flexion with 5/5 strength in knee extension, flexion, and toe plantarflexion and dorsiflexion.  5/5 strength in left toe dorsiflexion.  Intact sensation to light touch of all dermatomes of the left lower extremity [RP]  1818 Discussed with Camie Pickle from neurosurgery regarding his pain and weakness.  Does not feel that he would benefit from emergent surgery.  Feels the patient to be started on Medrol  Dosepak and pain medication and follow-up as an outpatient. [RP]  1826 Patient was updated.  Reports he just finished a Medrol  Dosepak on Sunday so we will hold off on the additional steroids.  Says that the Norco was very helpful so we will have him take this.  Instructed to follow-up with neurosurgery in the next 1 to 2 weeks and to return to the emergency department should he experience any concerning symptoms [RP]    Clinical Course User Index [RP] [name], MD   Medical Decision Making Amount and/or Complexity of Data Reviewed Labs:  ordered. Radiology: ordered. Decision-making details documented in ED Course.  Risk Prescription drug management.      [name], MD 02/15/24 [phone]

## 2024-02-15 NOTE — ED Notes (Signed)
 ED Provider at bedside.

## 2024-02-15 NOTE — ED Notes (Signed)
 Need another U/A due to spilling out in bag

## 2024-02-16 HISTORY — PX: BACK SURGERY: SHX140

## 2024-02-17 ENCOUNTER — Encounter: Payer: Self-pay | Admitting: Emergency Medicine

## 2024-02-17 ENCOUNTER — Ambulatory Visit (INDEPENDENT_AMBULATORY_CARE_PROVIDER_SITE_OTHER): Admitting: Emergency Medicine

## 2024-02-17 VITALS — BP 120/62 | HR 67 | Temp 98.2°F | Ht 74.0 in | Wt 227.0 lb

## 2024-02-17 DIAGNOSIS — Z09 Encounter for follow-up examination after completed treatment for conditions other than malignant neoplasm: Secondary | ICD-10-CM

## 2024-02-17 DIAGNOSIS — I10 Essential (primary) hypertension: Secondary | ICD-10-CM | POA: Diagnosis not present

## 2024-02-17 DIAGNOSIS — Z23 Encounter for immunization: Secondary | ICD-10-CM | POA: Diagnosis not present

## 2024-02-17 DIAGNOSIS — M5134 Other intervertebral disc degeneration, thoracic region: Secondary | ICD-10-CM

## 2024-02-17 DIAGNOSIS — I739 Peripheral vascular disease, unspecified: Secondary | ICD-10-CM

## 2024-02-17 DIAGNOSIS — M51362 Other intervertebral disc degeneration, lumbar region with discogenic back pain and lower extremity pain: Secondary | ICD-10-CM | POA: Diagnosis not present

## 2024-02-17 NOTE — Assessment & Plan Note (Signed)
 Much improved after stenting.

## 2024-02-17 NOTE — Assessment & Plan Note (Signed)
 Stable.  Presently on baby aspirin .  No longer taking Plavix.

## 2024-02-17 NOTE — Assessment & Plan Note (Signed)
 Significant findings on recent MRI lumbar spine accounting for presenting symptoms. Pain management discussed Has appointment to see spine surgeon tomorrow We will follow-up after that

## 2024-02-17 NOTE — Progress Notes (Signed)
 Adam Peters 73 y.o.   Chief Complaint  Patient presents with   Hospitalization Follow-up    Patient here for HFU, went on 9/30 for lumbar radiculopathy. Patient states he still feels the same.     HISTORY OF PRESENT ILLNESS: This is a 73 y.o. male here for follow-up of emergency department visit on 02/15/2024 when he presented with low back pain and sciatica MRI of thoracic and lumbar spine shows significant degenerative disc disease Was referred to spine surgeon.  Has appointment to see them tomorrow. Denies bowel or bladder problems.  Has occasional numbness and tingling to thighs.  Trouble going up and down stairs.  Unable to run. Walking slowly.  No other associated symptoms No other complaints or medical concerns today.  HPI   Prior to Admission medications   Medication Sig Start Date End Date Taking? Authorizing Provider  ARIPiprazole (ABILIFY) 5 MG tablet Take 5 mg by mouth daily. 04/23/23  Yes [provider]  aspirin  EC 81 MG tablet Take 81 mg by mouth daily.   Yes [provider]  atorvastatin  (LIPITOR) 40 MG tablet TAKE 1 TABLET DAILY (MUST SCHEDULE A FOLLOW UP APPOINTMENT FOR FURTHER REFILLS, [phone]) 02/14/24  Yes [name], [name], MD  camphor-menthol [facility]) lotion Apply 1 Application topically as needed. 04/12/23  Yes [provider]  Cholecalciferol (VITAMIN D) 50 MCG (2000 UT) tablet Take by mouth. 12/03/20  Yes [provider]  clobetasol ointment (TEMOVATE) 0.05 % Apply 1 Application topically 2 (two) times daily. 04/12/23  Yes [provider]  escitalopram (LEXAPRO) 20 MG tablet Take 25 mg by mouth daily.   Yes [provider]  ferrous fumarate (HEMOCYTE - 106 MG FE) 325 (106 Fe) MG TABS tablet Take 1 tablet by mouth daily. 12/30/22  Yes [provider]  fexofenadine  (ALLEGRA ) 180 MG tablet TAKE 1 TABLET DAILY AS NEEDED FOR ALLERGIES OR RHINITIS (CAN TAKE EXTRA DOSE DURING FLARE UPS) 09/27/23  Yes  Cheryl Reusing, FNP  fluticasone  (FLONASE ) 50 MCG/ACT nasal spray Use 2 sprays in each nostril once a day as needed for stuffy nose 10/13/22  Yes Cheryl Reusing, FNP  HYDROcodone -acetaminophen  (NORCO/VICODIN) 5-325 MG tablet Take 1 tablet by mouth every 4 (four) hours as needed. 02/15/24  Yes [name], MD  lidocaine  (LIDODERM ) 5 % Place 1 patch onto the skin daily. Remove & Discard patch within 12 hours or as directed by MD 02/15/24  Yes [name], MD  nitroGLYCERIN  (NITROSTAT ) 0.4 MG SL tablet DISSOLVE 1 TABLET UNDER THE TONGUE EVERY 5 MINUTES AS NEEDED FOR CHEST PAIN (SCHEDULE AN APPOINTMENT FOR FUTURE REFILLS) 10/20/22  Yes [name], [name], MD  olmesartan  (BENICAR ) 20 MG tablet Take 1 tablet (20 mg total) by mouth daily. 09/17/23  Yes Purcell Emil Schanz, MD  omeprazole  (PRILOSEC) 20 MG capsule TAKE 2 CAPSULES TWICE A DAY BEFORE MEALS 04/28/23  Yes [name], MD  REPATHA  SURECLICK 140 MG/ML SOAJ INJECT 140 MG UNDER THE SKIN EVERY 14 DAYS 09/10/23  Yes [name], [name], MD  silodosin (RAPAFLO) 8 MG CAPS capsule Take 8 mg by mouth daily. 09/19/21  Yes [provider]  Vibegron (GEMTESA) 75 MG TABS  03/25/22  Yes [provider]  STIOLTO RESPIMAT  2.5-2.5 MCG/ACT AERS Inhale two puffs once daily 06/30/23   Iva Marty Saltness, MD    Allergies  Allergen Reactions   Amoxicillin  Diarrhea    GI upset.    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 1997 PCI/BMS to distal LAD (J&J PS BMS);  b. 03/2011 Neg MV;  c. 07/2013 NSTEMI/PCI: LM 20d, LAD 67m, 50d isr, LCX nl, OM1/2 nl, RCA dom 95-42m (3.5x16 Promus premier DES), EF 55%, mild basal inf HK.   Cataract    Coronary artery disease    s/p PCI to LAD (1997) - Dr. RONAL Ly   GERD (gastroesophageal reflux disease)    Hepatitis C    History of tobacco use    Hyperlipidemia    Hypertension    Schizophrenia (HCC) 1978    Past Surgical History:  Procedure Laterality Date   APPENDECTOMY  1965   CARDIAC CATHETERIZATION  2001   patent LAD stent (Dr MITZIE)   CORONARY ANGIOPLASTY WITH STENT PLACEMENT  06/09/1995   prox LAD stenting 80% to 0% (Dr. RONAL Ly) - repeat cath in 11/1999 showed no significant coronary disease & normal systolic function (Dr. FABIENE Hasten)   CORONARY STENT PLACEMENT  08/02/2013   RCA   DES         DR COOPER   CORONARY STENT PLACEMENT  07/16/2022   HERNIA REPAIR  1994   knife wound repair  1969   LEFT HEART CATHETERIZATION WITH CORONARY ANGIOGRAM N/A 08/02/2013   Procedure: LEFT HEART CATHETERIZATION WITH CORONARY ANGIOGRAM;  Surgeon: Adam JONETTA Fell, MD;  Location: [facility] CATH LAB;  Service: Cardiovascular;  Laterality: N/A;   TRANSTHORACIC ECHOCARDIOGRAM  11/13/2008   EF 50-55%, normal LV systolic function; RA mildly dilated; trace MR/TR/PR    Social History   Socioeconomic History   Marital status: Married    Spouse name: Rock   Number of children: Not on file   Years of education: B.A.   Highest education level: Bachelor's degree (e.g., BA, AB, BS)  Occupational History   Occupation: retired    Associate Professor: [employer]  Tobacco Use    Smoking status: Former    Current packs/day: 0.00    Types: Cigarettes    Quit date: 05/18/1993    Years since quitting: 30.7   Smokeless tobacco: Never  Vaping Use   Vaping status: Never Used  Substance and Sexual Activity   Alcohol use: Yes    Alcohol/week: 3.0 standard drinks of alcohol    Types: 3 Glasses of wine per week    Comment: SOCIAL   Drug use: No   Sexual activity: Not Currently  Other Topics Concern   Not on file  Social History Narrative   Cafffeine occasional.  Education 4 yr college.  WorK: retired.     Lives with wife/2025   Social Drivers of Health   Financial Resource Strain: Low  Risk  (12/04/2023)   Overall Financial Resource Strain (CARDIA)    Difficulty of Paying Living Expenses: Not hard at all  Food Insecurity: Food Insecurity Present (12/04/2023)   Hunger Vital Sign    Worried About Running Out of Food in the Last Year: Sometimes true    Ran Out of Food in the Last Year: Never true  Transportation Needs: No Transportation Needs (12/04/2023)   PRAPARE - Administrator, Civil Service (Medical): No    Lack of Transportation (Non-Medical): No  Physical Activity: Sufficiently Active (12/04/2023)   Exercise Vital Sign    Days of Exercise per Week: 5 days    Minutes of Exercise per Session: 80 min  Stress: No Stress Concern Present (12/04/2023)   [employer] of Occupational Health - Occupational Stress Questionnaire    Feeling of Stress: Not at all  Social Connections: Unknown (12/04/2023)   Social Connection and Isolation Panel    Frequency of Communication with Friends and Family: Patient declined    Frequency of Social Gatherings with Friends and Family: Patient declined    Attends Religious Services: Patient declined    Database administrator or Organizations: Yes    Attends Banker Meetings: Patient declined    Marital Status: Married  Recent Concern: Social Connections - Moderately Isolated (11/23/2023)   Social  Connection and Isolation Panel    Frequency of Communication with Friends and Family: Twice a week    Frequency of Social Gatherings with Friends and Family: More than three times a week    Attends Religious Services: Never    Database administrator or Organizations: No    Attends Banker Meetings: Never    Marital Status: Married  Catering manager Violence: Not At Risk (11/23/2023)   Humiliation, Afraid, Rape, and Kick questionnaire    Fear of Current or Ex-Partner: No    Emotionally Abused: No    Physically Abused: No    Sexually Abused: No    Family History  Problem Relation Age of Onset   Asthma Mother    Crohn's disease Mother    Heart disease Mother    Hepatitis C Brother    Testicular cancer Brother    Dementia Father    Diabetes Maternal Grandmother    Emphysema Maternal Aunt    Cancer Maternal Aunt    Colon cancer Neg Hx      Review of Systems  Constitutional: Negative.  Negative for chills and fever.  HENT: Negative.  Negative for congestion and sore throat.   Respiratory: Negative.  Negative for cough and shortness of breath.   Cardiovascular: Negative.  Negative for chest pain and palpitations.  Gastrointestinal:  Negative for abdominal pain, diarrhea, nausea and vomiting.  Genitourinary: Negative.  Negative for dysuria and hematuria.  Skin: Negative.  Negative for rash.  Neurological: Negative.  Negative for dizziness and headaches.  All other systems reviewed and are negative.   Vitals:   02/17/24 1009  BP: 120/62  Pulse: 67  Temp: 98.2 F (36.8 C)  SpO2: 97%    Physical Exam Vitals reviewed.  Constitutional:      Appearance: Normal appearance.  HENT:     Head: Normocephalic.  Eyes:     Extraocular Movements: Extraocular movements intact.  Cardiovascular:     Rate and Rhythm: Normal rate.  Pulmonary:     Effort: Pulmonary effort is normal.  Skin:    General: Skin is warm and dry.  Neurological:  Mental Status: He is alert  and oriented to person, place, and time.     Sensory: No sensory deficit.     Motor: No weakness.  Psychiatric:        Mood and Affect: Mood normal.        Behavior: Behavior normal.   MR THORACIC SPINE W WO CONTRAST Result Date: 02/15/2024 CLINICAL DATA:  Provided history: Myelopathy, acute, thoracic spine. EXAM: MRI THORACIC WITHOUT AND WITH CONTRAST TECHNIQUE: Multiplanar and multiecho pulse sequences of the thoracic spine were obtained without and with intravenous contrast. CONTRAST:  10mL GADAVIST GADOBUTROL 1 MMOL/ML IV SOLN COMPARISON:  None. FINDINGS: Alignment:  No significant spondylolisthesis. Vertebrae: No thoracic vertebral compression fracture. Small Schmorl node within the T11 inferior endplate. Mild degenerative endplate edema enhancement at T8-T9. Partially imaged marrow edema and enhancement also present within the posterior elements on the left at T9 and within the adjacent posterior left ninth rib. Small multilevel ventral osteophytes. Cord: No signal abnormality identified within the thoracic spinal cord. No pathologic spinal cord enhancement. Paraspinal and other soft tissues: No acute finding within included portions of the thorax or upper abdomen/retroperitoneum. No paraspinal mass or collection. Disc levels: Multilevel disc degeneration, greatest at T2-T3, T3-T4, T4-T5, T5-T6, T6-T7, T7-T8 and T8-T9 (moderate-to-advanced at these levels). T1-T2: Disc bulge. Mild relative spinal canal narrowing (without spinal cord mass effect). No significant foraminal stenosis. T2-T3: Disc bulge with endplate spurring. Mild relative spinal canal narrowing (without spinal cord mass effect). Bilateral neural foraminal narrowing (moderate right, mild left). T3-T4: Disc bulge. Superimposed small left center disc protrusion. Mild relative spinal canal narrowing (without spinal cord mass effect). Mild-to-moderate left neural foraminal narrowing. T4-T5: Disc bulge. Superimposed broad-based right  center/foraminal disc protrusion. No significant spinal canal stenosis. Mild-to-moderate right neural foraminal narrowing. T5-T6: Disc bulge. No significant spinal canal stenosis. Mild relative bilateral neural foraminal narrowing. T6-T7: Disc bulge. Mild facet arthropathy. No significant spinal canal stenosis. Mild relative bilateral neural foraminal narrowing. T7-T8: Disc bulge. No significant spinal canal or foraminal stenosis. T8-T9: Disc bulge. Superimposed left foraminal disc protrusion. No significant spinal canal stenosis. Mild-to-moderate left neural foraminal narrowing. T9-T10: Disc bulge. Superimposed broad-based right center/foraminal disc protrusion. No significant spinal canal stenosis. Mild right neural foraminal narrowing. T10-T11: No significant disc herniation or stenosis. T11-T12: No significant disc herniation or stenosis. IMPRESSION: 1. Thoracic spondylosis as outlined within the body of the report. 2. No more than mild spinal canal stenosis. 3. Multilevel foraminal stenosis as detailed, greatest on the right at T2-T3 (moderate at this site). 4. Disc degeneration is greatest at T2-T3, T3-T4, T4-T5, T5-T6, T6-T7, T7-T8 and T8-T9 (moderate-to-advanced at these levels). 5. Mild degenerative endplate edema and enhancement at T8-T9. 6. Partially imaged marrow edema and enhancement within the posterior elements on the left at T9 and within the adjacent posterior left ninth rib. Barring any recent trauma, these findings are also likely degenerative. Electronically Signed   By: Rockey Childs D.O.   On: 02/15/2024 17:15   MR Lumbar Spine W Wo Contrast Result Date: 02/15/2024 CLINICAL DATA:  Provided history: Myelopathy, acute, lumbar spine. Additional history provided: Left-sided low back pain, pain radiating to bilateral lower extremities for two weeks. EXAM: MRI LUMBAR SPINE WITHOUT AND WITH CONTRAST TECHNIQUE: Multiplanar and multiecho pulse sequences of the lumbar spine were obtained without and  with intravenous contrast. CONTRAST:  10mL GADAVIST GADOBUTROL 1 MMOL/ML IV SOLN COMPARISON:  None. FINDINGS: Segmentation: 5 lumbar vertebrae. The caudal most well-formed intervertebral disc space is designated L5-S1. Alignment: 5 mm  L4-L5 grade 1 anterolisthesis. 3 mm L5-S1 grade 1 retrolisthesis. Vertebrae: No lumbar vertebral compression fracture. Degenerative endplate irregularity and degenerative endplate edema/enhancement at L5-S1. Marrow edema and enhancement within the posterior elements on the right at L5, likely degenerative and related to facet arthropathy. Conus medullaris and cauda equina: Conus extends to the L1 level. No signal abnormality identified within the visualized distal spinal cord. No pathologic enhancement of the visualized distal spinal cord or cauda equina nerve roots. Paraspinal and other soft tissues: No acute finding within included portions of the abdomen/retroperitoneum. No paraspinal mass or collection. Disc levels: Moderate disc degeneration at L4-L5. Moderate-to-advanced disc degeneration at L5-S1. No more than mild disc degeneration at the remaining levels. T12-L1: A small disc protrusion (with associated endplate spurring) results in mild left neural foraminal narrowing (series 8, image 9). L1-L2: No significant disc herniation or stenosis. L2-L3: Mild facet and ligamentum flavum hypertrophy. No significant spinal canal stenosis. Mild inferior neural foraminal narrowing, bilaterally. L3-L4: Disc bulge. No significant spinal canal stenosis. Mild bilateral neural foraminal narrowing. L4-L5: Grade 1 anterolisthesis. Partial disc uncovering with disc bulge. Advanced facet arthropathy with ligamentum flavum hypertrophy. Small facet joint effusions bilaterally. Mild-to-moderate left subarticular stenosis with some crowding of the descending left L5 nerve root (series 11, image 24). No significant central canal stenosis. Moderate bilateral neural foraminal narrowing (greater on the  left). L5-S1: Grade 1 retrolisthesis. Disc bulge with endplate spurring. Superimposed broad-based central disc protrusion (eccentric to the right). Mild facet arthropathy. Small facet joint effusions bilaterally. The disc protrusion mildly effaces the ventral thecal sac, abutting the descending right S1 nerve root within the right subarticular zone (series 11, image 31). Bilateral neural foraminal narrowing (moderate right, moderate-to-severe left). IMPRESSION: 1. Lumbar spondylosis as outlined within the body of the report. 2. At L4-L5, there is moderate disc degeneration. Grade 1 anterolisthesis. Disc bulge. Advanced facet arthropathy with ligamentum flavum hypertrophy. Mild-to-moderate left subarticular stenosis with some crowding of the descending left L5 nerve root. Moderate bilateral neural foraminal narrowing (greater on the left). 3. At L5-S1, there is moderate-to-advanced disc degeneration (with degenerative endplate edema/enhancement). Grade 1 retrolisthesis. Disc bulge with endplate spurring. Superimposed broad-based central disc protrusion. The disc protrusion mildly effaces the ventral thecal sac, abutting the descending right S1 nerve root in the right subarticular zone. Bilateral neural foraminal narrowing (moderate right, moderate-to-severe left). 4. No significant spinal canal stenosis, and no more than mild neural foraminal narrowing, at the remaining levels. 5. Marrow edema and enhancement within the posterior elements on the right at L5, likely degenerative and related to facet arthropathy. 6. Small facet joint effusions bilaterally at L4-L5 and L5-S1. Electronically Signed   By: Rockey Childs D.O.   On: 02/15/2024 16:49      ASSESSMENT & PLAN: Problem List Items Addressed This Visit       Cardiovascular and Mediastinum   Essential hypertension (Chronic)   BP Readings from Last 3 Encounters:  02/17/24 120/62  02/15/24 (!) 177/88  09/09/23 128/86  Well-controlled  hypertension. Continue olmesartan  20 mg daily       Peripheral arterial disease   Stable.  Presently on baby aspirin .  No longer taking Plavix.        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 Pain management discussed Has appointment to see spine surgeon tomorrow We will follow-up after that  Degenerative disc disease, thoracic   Significant findings on recent MRI lumbar spine accounting for presenting symptoms. Pain management discussed Has appointment to see spine surgeon tomorrow We will follow-up after that        Other   Intermittent claudication   Much improved after stenting.       Other Visit Diagnoses       Need for vaccination       Relevant Orders   Flu vaccine HIGH DOSE PF(Fluzone Trivalent) (Completed)     Hospital discharge follow-up          Patient Instructions  Breakdown of Disks in the Spine (Degenerative Disk Disease): What to Know  Degenerative disk disease happens because of changes that affect the disks in your spine as you get older. These soft disks are located between the bones of your spine. They act like shock absorbers. Degenerative disk disease can affect the whole spine. But it's most common in the neck and lower back. As you age, many changes can occur in the spinal disks, such as: Disks may dry out and shrink. Small tears may occur in the tough outer covering of a disk. The disk space may become smaller due to loss of water. Abnormal growths in the bone (spurs) may occur. This can put pressure on the nerve roots leaving the spinal canal, causing pain. The spinal canal may become narrow. What are the causes? Degenerative disk disease may be caused by: Normal wear and tear as you age. Injuries. Certain activities or sports that cause damage. What  increases the risk? Being overweight. Having a family history of degenerative disk disease. Smoking or using nicotine or tobacco. Sudden injury. Doing work that requires heavy lifting. What are the signs or symptoms? Symptoms include: Pain that varies in intensity. Some people have no pain, while others have severe pain. The location of the pain depends on the part of your spine that's affected. You may have: Pain in your neck or arm if a disk in your neck area is affected. Pain in your back, butt, or legs if a disk in your lower back is affected. Pain that gets worse while bending, reaching up, or doing twisting movements. Pain that may start slowly and get worse as time passes. It may also start after a major or minor injury. Numbness or tingling in the arms or legs. How is this diagnosed? Degenerative disk disease may be diagnosed based on: Your symptoms and medical history. A physical exam. Imaging tests, including: X-ray of the spine. CT scan. MRI. How is this treated? Treatment may include: Medicines. Injection of steroids into the back. Rehab exercises. These activities aim to strengthen muscles in your back and belly to better support your spine. If treatments don't help to relieve your symptoms or if you have severe pain, you may need surgery. Follow these instructions at home: Medicines Take your medicines only as told. You may need to take steps to help treat or prevent trouble pooping (constipation), such as: Taking medicines to help you poop. Eating foods high in fiber, like beans, whole grains, and fresh fruits and vegetables. Drinking more fluids as told. Ask your health care provider if it's safe to drive or use machines while taking your medicine. Activity Rest as told. Get up to take short walks many times during the day. This helps you breathe better and keeps your blood flowing. Ask for help if you feel weak or unsteady. Ask what things are safe for you to  do  at home. Ask when you can go back to work or school. Do relaxation exercises as told by your provider. Maintain good posture. Follow proper lifting and walking techniques as told. Managing pain, stiffness, and swelling     Use ice or an ice pack as told. Icing can help to relieve pain. Place a towel between your skin and the ice. Leave the ice on for 20 minutes, 2-3 times a day. Use heat as told. Heat can reduce the stiffness of your muscles. Use the heat source that your provider recommends, such as a moist heat pack or a heating pad. Do this as often as told. Place a towel between your skin and the heat source. Leave the heat on for 20-30 minutes. If your skin turns red, take off the ice or heat right away to prevent skin damage. The risk of damage is higher if you can't feel pain, heat, or cold. General instructions Change your sitting, standing, and sleeping habits as told by your provider. Avoid sitting in the same position for long periods of time. Change positions often. Lose weight or stay at a healthy weight as told. Do not smoke, vape, or use nicotine or tobacco. Wear supportive footwear. Keep all follow-up visits. This may include visits for physical therapy. Contact a health care provider if: You have pain that doesn't go away within 1-4 weeks. You lose your appetite. You lose weight without trying. You have fevers or night sweats. Get help right away if: You have severe pain. You have weakness in your arms, hands, or legs. You start to lose control of when you pee or poop. This information is not intended to replace advice given to you by your health care provider. Make sure you discuss any questions you have with your health care provider. Document Revised: 05/27/2023 Document Reviewed: 05/27/2023 Elsevier Patient Education  2025 Elsevier Inc.     Emil Schaumann, MD  Primary Care at [facility]

## 2024-02-17 NOTE — Assessment & Plan Note (Signed)
 BP Readings from Last 3 Encounters:  02/17/24 120/62  02/15/24 (!) 177/88  09/09/23 128/86  Well-controlled hypertension. Continue olmesartan  20 mg daily

## 2024-02-17 NOTE — Patient Instructions (Signed)
 Breakdown of Disks in the Spine (Degenerative Disk Disease): What to Know  Degenerative disk disease happens because of changes that affect the disks in your spine as you get older. These soft disks are located between the bones of your spine. They act like shock absorbers. Degenerative disk disease can affect the whole spine. But it's most common in the neck and lower back. As you age, many changes can occur in the spinal disks, such as: Disks may dry out and shrink. Small tears may occur in the tough outer covering of a disk. The disk space may become smaller due to loss of water. Abnormal growths in the bone (spurs) may occur. This can put pressure on the nerve roots leaving the spinal canal, causing pain. The spinal canal may become narrow. What are the causes? Degenerative disk disease may be caused by: Normal wear and tear as you age. Injuries. Certain activities or sports that cause damage. What increases the risk? Being overweight. Having a family history of degenerative disk disease. Smoking or using nicotine or tobacco. Sudden injury. Doing work that requires heavy lifting. What are the signs or symptoms? Symptoms include: Pain that varies in intensity. Some people have no pain, while others have severe pain. The location of the pain depends on the part of your spine that's affected. You may have: Pain in your neck or arm if a disk in your neck area is affected. Pain in your back, butt, or legs if a disk in your lower back is affected. Pain that gets worse while bending, reaching up, or doing twisting movements. Pain that may start slowly and get worse as time passes. It may also start after a major or minor injury. Numbness or tingling in the arms or legs. How is this diagnosed? Degenerative disk disease may be diagnosed based on: Your symptoms and medical history. A physical exam. Imaging tests, including: X-ray of the spine. CT scan. MRI. How is this  treated? Treatment may include: Medicines. Injection of steroids into the back. Rehab exercises. These activities aim to strengthen muscles in your back and belly to better support your spine. If treatments don't help to relieve your symptoms or if you have severe pain, you may need surgery. Follow these instructions at home: Medicines Take your medicines only as told. You may need to take steps to help treat or prevent trouble pooping (constipation), such as: Taking medicines to help you poop. Eating foods high in fiber, like beans, whole grains, and fresh fruits and vegetables. Drinking more fluids as told. Ask your health care provider if it's safe to drive or use machines while taking your medicine. Activity Rest as told. Get up to take short walks many times during the day. This helps you breathe better and keeps your blood flowing. Ask for help if you feel weak or unsteady. Ask what things are safe for you to do at home. Ask when you can go back to work or school. Do relaxation exercises as told by your provider. Maintain good posture. Follow proper lifting and walking techniques as told. Managing pain, stiffness, and swelling     Use ice or an ice pack as told. Icing can help to relieve pain. Place a towel between your skin and the ice. Leave the ice on for 20 minutes, 2-3 times a day. Use heat as told. Heat can reduce the stiffness of your muscles. Use the heat source that your provider recommends, such as a moist heat pack or a heating pad. Do  this as often as told. Place a towel between your skin and the heat source. Leave the heat on for 20-30 minutes. If your skin turns red, take off the ice or heat right away to prevent skin damage. The risk of damage is higher if you can't feel pain, heat, or cold. General instructions Change your sitting, standing, and sleeping habits as told by your provider. Avoid sitting in the same position for long periods of time. Change  positions often. Lose weight or stay at a healthy weight as told. Do not smoke, vape, or use nicotine or tobacco. Wear supportive footwear. Keep all follow-up visits. This may include visits for physical therapy. Contact a health care provider if: You have pain that doesn't go away within 1-4 weeks. You lose your appetite. You lose weight without trying. You have fevers or night sweats. Get help right away if: You have severe pain. You have weakness in your arms, hands, or legs. You start to lose control of when you pee or poop. This information is not intended to replace advice given to you by your health care provider. Make sure you discuss any questions you have with your health care provider. Document Revised: 05/27/2023 Document Reviewed: 05/27/2023 [employer]vier Patient Education  2025 [employer].

## 2024-02-18 DIAGNOSIS — Z6828 Body mass index (BMI) 28.0-28.9, adult: Secondary | ICD-10-CM | POA: Diagnosis not present

## 2024-02-18 DIAGNOSIS — M5416 Radiculopathy, lumbar region: Secondary | ICD-10-CM | POA: Diagnosis not present

## 2024-02-21 DIAGNOSIS — H40013 Open angle with borderline findings, low risk, bilateral: Secondary | ICD-10-CM | POA: Diagnosis not present

## 2024-02-21 DIAGNOSIS — H2513 Age-related nuclear cataract, bilateral: Secondary | ICD-10-CM | POA: Diagnosis not present

## 2024-02-21 DIAGNOSIS — I10 Essential (primary) hypertension: Secondary | ICD-10-CM | POA: Diagnosis not present

## 2024-02-22 ENCOUNTER — Ambulatory Visit: Admitting: Allergy & Immunology

## 2024-03-02 DIAGNOSIS — M5416 Radiculopathy, lumbar region: Secondary | ICD-10-CM | POA: Diagnosis not present

## 2024-03-09 DIAGNOSIS — M5459 Other low back pain: Secondary | ICD-10-CM | POA: Diagnosis not present

## 2024-03-09 DIAGNOSIS — M5416 Radiculopathy, lumbar region: Secondary | ICD-10-CM | POA: Diagnosis not present

## 2024-03-10 DIAGNOSIS — M5416 Radiculopathy, lumbar region: Secondary | ICD-10-CM | POA: Diagnosis not present

## 2024-03-10 DIAGNOSIS — M5459 Other low back pain: Secondary | ICD-10-CM | POA: Diagnosis not present

## 2024-03-13 ENCOUNTER — Ambulatory Visit: Admitting: Emergency Medicine

## 2024-03-17 DIAGNOSIS — M5459 Other low back pain: Secondary | ICD-10-CM | POA: Diagnosis not present

## 2024-03-17 DIAGNOSIS — M5416 Radiculopathy, lumbar region: Secondary | ICD-10-CM | POA: Diagnosis not present

## 2024-03-20 ENCOUNTER — Ambulatory Visit: Admitting: Emergency Medicine

## 2024-03-20 ENCOUNTER — Encounter: Payer: Self-pay | Admitting: Emergency Medicine

## 2024-03-20 VITALS — BP 120/70 | HR 56 | Temp 97.9°F | Ht 74.0 in | Wt 231.0 lb

## 2024-03-20 DIAGNOSIS — K219 Gastro-esophageal reflux disease without esophagitis: Secondary | ICD-10-CM | POA: Diagnosis not present

## 2024-03-20 DIAGNOSIS — N401 Enlarged prostate with lower urinary tract symptoms: Secondary | ICD-10-CM

## 2024-03-20 DIAGNOSIS — M51362 Other intervertebral disc degeneration, lumbar region with discogenic back pain and lower extremity pain: Secondary | ICD-10-CM

## 2024-03-20 DIAGNOSIS — I739 Peripheral vascular disease, unspecified: Secondary | ICD-10-CM | POA: Diagnosis not present

## 2024-03-20 DIAGNOSIS — I10 Essential (primary) hypertension: Secondary | ICD-10-CM | POA: Diagnosis not present

## 2024-03-20 DIAGNOSIS — N138 Other obstructive and reflux uropathy: Secondary | ICD-10-CM

## 2024-03-20 DIAGNOSIS — E785 Hyperlipidemia, unspecified: Secondary | ICD-10-CM

## 2024-03-20 NOTE — Assessment & Plan Note (Signed)
 BP Readings from Last 3 Encounters:  03/20/24 128/84  02/17/24 120/62  02/15/24 (!) 177/88  Well-controlled hypertension. Continue olmesartan  20 mg daily

## 2024-03-20 NOTE — Assessment & Plan Note (Signed)
 Stable. Continues Rapaflo 8 mg daily  Also taking Gemtesa 75 mg daily

## 2024-03-20 NOTE — Assessment & Plan Note (Signed)
 Significant findings on recent MRI lumbar spine accounting for presenting symptoms. Pain management discussed Was able to follow-up with spine surgeon and have procedure done Helping some

## 2024-03-20 NOTE — Assessment & Plan Note (Signed)
 Clinically stable. Continues omeprazole  20 mg as needed

## 2024-03-20 NOTE — Assessment & Plan Note (Signed)
 Stable.  Presently on baby aspirin .  No longer taking Plavix.

## 2024-03-20 NOTE — Progress Notes (Signed)
 Adam Peters 73 y.o.   Chief Complaint  Patient presents with   Follow-up    6 months / pt has been having back issues lower pain in the buttock area     HISTORY OF PRESENT ILLNESS: This is a 73 y.o. male here for 41-month follow-up of chronic medical conditions Was recently seen by spine surgeon for procedure.  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  HPI   Prior to Admission medications   Medication Sig Start Date End Date Taking? Authorizing Provider  ARIPiprazole (ABILIFY) 5 MG tablet Take 5 mg by mouth daily. 04/23/23   [provider]  aspirin  EC 81 MG tablet Take 81 mg by mouth daily.    [provider]  atorvastatin  (LIPITOR) 40 MG tablet TAKE 1 TABLET DAILY (MUST SCHEDULE A FOLLOW UP APPOINTMENT FOR FURTHER REFILLS, [phone]) 02/14/24   [name], [name], MD  camphor-menthol [facility]) lotion Apply 1 Application topically as needed. 04/12/23   [provider]  Cholecalciferol (VITAMIN D) 50 MCG (2000 UT) tablet Take by mouth. 12/03/20   [provider]  clobetasol ointment (TEMOVATE) 0.05 % Apply 1 Application topically 2 (two) times daily. 04/12/23   [provider]  escitalopram (LEXAPRO) 20 MG tablet Take 25 mg by mouth daily.    [provider]  ferrous fumarate (HEMOCYTE - 106 MG FE) 325 (106 Fe) MG TABS tablet Take 1 tablet by mouth daily. 12/30/22   [provider]  fexofenadine  (ALLEGRA ) 180 MG tablet TAKE 1 TABLET DAILY AS NEEDED FOR ALLERGIES OR RHINITIS (CAN TAKE EXTRA DOSE DURING FLARE UPS) 09/27/23   Cheryl Reusing, FNP  fluticasone  (FLONASE ) 50 MCG/ACT nasal spray Use 2 sprays in each nostril once a day as needed for stuffy nose 10/13/22   Cheryl Reusing, FNP  HYDROcodone -acetaminophen  (NORCO/VICODIN) 5-325 MG tablet Take 1 tablet by mouth every 4 (four) hours as  needed. 02/15/24   [name], MD  lidocaine  (LIDODERM ) 5 % Place 1 patch onto the skin daily. Remove & Discard patch within 12 hours or as directed by MD 02/15/24   [name], MD  nitroGLYCERIN  (NITROSTAT ) 0.4 MG SL tablet DISSOLVE 1 TABLET UNDER THE TONGUE EVERY 5 MINUTES AS NEEDED FOR CHEST PAIN (SCHEDULE AN APPOINTMENT FOR FUTURE REFILLS) 10/20/22   [name], [name], MD  olmesartan  (BENICAR ) 20 MG tablet Take 1 tablet (20 mg total) by mouth daily. 09/17/23   Purcell Emil Schanz, MD  omeprazole  (PRILOSEC) 20 MG capsule TAKE 2 CAPSULES TWICE A DAY BEFORE MEALS 04/28/23   [name], MD  REPATHA  SURECLICK 140 MG/ML SOAJ INJECT 140 MG UNDER THE SKIN EVERY 14 DAYS 09/10/23   [name], [name], MD  silodosin (RAPAFLO) 8 MG CAPS capsule Take 8 mg by mouth daily. 09/19/21   [provider]  Vibegron (GEMTESA) 75 MG TABS  03/25/22   [provider]    Allergies  Allergen Reactions   Amoxicillin  Diarrhea    GI upset.    Patient Active Problem List   Diagnosis Date Noted   Degenerative disc disease (DDD) of lumbar region with discogenic back pain and leg pain 02/17/2024   Degenerative disc disease, thoracic 02/17/2024   Gastroesophageal reflux disease 07/03/2022   Cognitive changes 05/20/2022   Peripheral arterial disease 03/03/2022   Intermittent claudication 02/16/2022   Chronic right shoulder pain 02/16/2022   BPH with obstruction/lower  urinary tract symptoms 09/08/2021   Moderate persistent asthma, uncomplicated 07/03/2021   OSA on CPAP 03/10/2021   Asthma 10/03/2020   Seasonal and perennial allergic rhinitis 04/04/2019   Ex-smoker 04/04/2019   Mild persistent asthma, uncomplicated 04/04/2019   Essential hypertension    Dyslipidemia    History of tobacco use 07/08/2012   Glaucoma 04/08/2012   DDD (degenerative disc disease), cervical    CAD- LAD BMS '97, RCA DES 08/02/13 08/17/2011    Past Medical History:  Diagnosis Date   Allergy     seasonal    Anemia    Anxiety    Asthma    CAD (coronary artery disease)    a. 1997 PCI/BMS to distal LAD (J&J PS BMS);  b. 03/2011 Neg MV;  c. 07/2013 NSTEMI/PCI: LM 20d, LAD 13m, 50d isr, LCX nl, OM1/2 nl, RCA dom 95-27m (3.5x16 Promus premier DES), EF 55%, mild basal inf HK.   Cataract    Coronary artery disease    s/p PCI to LAD (1997) - Dr. RONAL Ly   GERD (gastroesophageal reflux disease)    Hepatitis C    History of tobacco use    Hyperlipidemia    Hypertension    Schizophrenia (HCC) 1978    Past Surgical History:  Procedure Laterality Date   APPENDECTOMY  1965   CARDIAC CATHETERIZATION  2001   patent LAD stent (Dr MITZIE)   CORONARY ANGIOPLASTY WITH STENT PLACEMENT  06/09/1995   prox LAD stenting 80% to 0% (Dr. RONAL Ly) - repeat cath in 11/1999 showed no significant coronary disease & normal systolic function (Dr. FABIENE Hasten)   CORONARY STENT PLACEMENT  08/02/2013   RCA   DES         DR COOPER   CORONARY STENT PLACEMENT  07/16/2022   HERNIA REPAIR  1994   knife wound repair  1969   LEFT HEART CATHETERIZATION WITH CORONARY ANGIOGRAM N/A 08/02/2013   Procedure: LEFT HEART CATHETERIZATION WITH CORONARY ANGIOGRAM;  Surgeon: Adam JONETTA Fell, MD;  Location: [facility] CATH LAB;  Service: Cardiovascular;  Laterality: N/A;   TRANSTHORACIC ECHOCARDIOGRAM  11/13/2008   EF 50-55%, normal LV systolic function; RA mildly dilated; trace MR/TR/PR    Social History   Socioeconomic History   Marital status: Married    Spouse name: Adam Peters   Number of children: Not on file   Years of education: B.A.   Highest education level: Bachelor's degree (e.g., BA, AB, BS)  Occupational History   Occupation: retired    Associate Professor: [employer]  Tobacco Use   Smoking status: Former    Current packs/day: 0.00    Types: Cigarettes    Quit date: 05/18/1993    Years since quitting: 30.8   Smokeless tobacco: Never  Vaping Use   Vaping status: Never Used  Substance and Sexual Activity   Alcohol use: Yes     Alcohol/week: 3.0 standard drinks of alcohol    Types: 3 Glasses of wine per week    Comment: SOCIAL   Drug use: No   Sexual activity: Not Currently  Other Topics Concern   Not on file  Social History Narrative   Cafffeine occasional.  Education 4 yr college.  WorK: retired.     Lives with wife/2025   Social Drivers of Health   Financial Resource Strain: Low Risk  (12/04/2023)   Overall Financial Resource Strain (CARDIA)    Difficulty of Paying Living Expenses: Not hard at all  Food Insecurity: Food Insecurity Present (12/04/2023)   Hunger  Vital Sign    Worried About Programme Researcher, Broadcasting/film/video in the Last Year: Sometimes true    Ran Out of Food in the Last Year: Never true  Transportation Needs: No Transportation Needs (12/04/2023)   PRAPARE - Administrator, Civil Service (Medical): No    Lack of Transportation (Non-Medical): No  Physical Activity: Sufficiently Active (12/04/2023)   Exercise Vital Sign    Days of Exercise per Week: 5 days    Minutes of Exercise per Session: 80 min  Stress: No Stress Concern Present (12/04/2023)   [employer] of Occupational Health - Occupational Stress Questionnaire    Feeling of Stress: Not at all  Social Connections: Unknown (12/04/2023)   Social Connection and Isolation Panel    Frequency of Communication with Friends and Family: Patient declined    Frequency of Social Gatherings with Friends and Family: Patient declined    Attends Religious Services: Patient declined    Database Administrator or Organizations: Yes    Attends Banker Meetings: Patient declined    Marital Status: Married  Recent Concern: Social Connections - Moderately Isolated (11/23/2023)   Social Connection and Isolation Panel    Frequency of Communication with Friends and Family: Twice a week    Frequency of Social Gatherings with Friends and Family: More than three times a week    Attends Religious Services: Never    Database Administrator or  Organizations: No    Attends Banker Meetings: Never    Marital Status: Married  Catering Manager Violence: Not At Risk (11/23/2023)   Humiliation, Afraid, Rape, and Kick questionnaire    Fear of Current or Ex-Partner: No    Emotionally Abused: No    Physically Abused: No    Sexually Abused: No    Family History  Problem Relation Age of Onset   Asthma Mother    Crohn's disease Mother    Heart disease Mother    Hepatitis C Brother    Testicular cancer Brother    Dementia Father    Diabetes Maternal Grandmother    Emphysema Maternal Aunt    Cancer Maternal Aunt    Colon cancer Neg Hx      Review of Systems  Constitutional: Negative.  Negative for chills and fever.  HENT: Negative.  Negative for congestion and sore throat.   Respiratory: Negative.  Negative for cough and shortness of breath.   Cardiovascular: Negative.  Negative for chest pain and palpitations.  Gastrointestinal:  Negative for abdominal pain, diarrhea, nausea and vomiting.  Genitourinary: Negative.  Negative for dysuria and hematuria.  Skin: Negative.  Negative for rash.  Neurological: Negative.  Negative for dizziness and headaches.  All other systems reviewed and are negative.   Today's Vitals   03/20/24 0809 03/20/24 0836  BP: 128/84 120/70  Pulse: (!) 56   Temp: 97.9 F (36.6 C)   TempSrc: Oral   SpO2: 96%   Weight: 231 lb (104.8 kg)   Height: 6' 2 (1.88 m)    Body mass index is 29.66 kg/m.   Physical Exam Vitals reviewed.  Constitutional:      Appearance: Normal appearance.  HENT:     Head: Normocephalic.     Mouth/Throat:     Mouth: Mucous membranes are moist.     Pharynx: Oropharynx is clear.  Eyes:     Extraocular Movements: Extraocular movements intact.     Pupils: Pupils are equal, round, and reactive to light.  Cardiovascular:  Rate and Rhythm: Normal rate and regular rhythm.     Pulses: Normal pulses.     Heart sounds: Normal heart sounds.  Pulmonary:      Effort: Pulmonary effort is normal.     Breath sounds: Normal breath sounds.  Musculoskeletal:     Cervical back: No tenderness.  Lymphadenopathy:     Cervical: No cervical adenopathy.  Skin:    General: Skin is warm and dry.     Capillary Refill: Capillary refill takes less than 2 seconds.  Neurological:     General: No focal deficit present.     Mental Status: He is alert and oriented to person, place, and time.  Psychiatric:        Mood and Affect: Mood normal.        Behavior: Behavior normal.      ASSESSMENT & PLAN: A total of 42 minutes was spent with the patient and counseling/coordination of care regarding preparing for this visit, review of most recent office visit notes, review of multiple chronic medical conditions and their management, cardiovascular risks associated with hypertension, review of all medications, review of most recent bloodwork results, review of health maintenance items, education on nutrition, prognosis, documentation, and need for follow up.   Problem List Items Addressed This Visit       Cardiovascular and Mediastinum   Essential hypertension - Primary (Chronic)   BP Readings from Last 3 Encounters:  03/20/24 128/84  02/17/24 120/62  02/15/24 (!) 177/88  Well-controlled hypertension. Continue olmesartan  20 mg daily         Peripheral arterial disease   Stable. Presently on baby aspirin . No longer taking Plavix.         Digestive   Gastroesophageal reflux disease   Clinically stable. Continues omeprazole  20 mg as needed        Musculoskeletal and Integument   Degenerative disc disease (DDD) of lumbar region with discogenic back pain and leg pain   Significant findings on recent MRI lumbar spine accounting for presenting symptoms. Pain management discussed Was able to follow-up with spine surgeon and have procedure done Helping some        Genitourinary   BPH with obstruction/lower urinary tract symptoms   Stable. Continues  Rapaflo 8 mg daily  Also taking Gemtesa 75 mg daily        Other   Dyslipidemia   Chronic stable condition Continue atorvastatin  40 mg daily Also taking Repatha  140 mg every 14 days      Patient Instructions  Hypertension, Adult High blood pressure (hypertension) is when the force of blood pumping through the arteries is too strong. The arteries are the blood vessels that carry blood from the heart throughout the body. Hypertension forces the heart to work harder to pump blood and may cause arteries to become narrow or stiff. Untreated or uncontrolled hypertension can lead to a heart attack, heart failure, a stroke, kidney disease, and other problems. A blood pressure reading consists of a higher number over a lower number. Ideally, your blood pressure should be below 120/80. The first (top) number is called the systolic pressure. It is a measure of the pressure in your arteries as your heart beats. The second (bottom) number is called the diastolic pressure. It is a measure of the pressure in your arteries as the heart relaxes. What are the causes? The exact cause of this condition is not known. There are some conditions that result in high blood pressure. What increases the risk? Certain  factors may make you more likely to develop high blood pressure. Some of these risk factors are under your control, including: Smoking. Not getting enough exercise or physical activity. Being overweight. Having too much fat, sugar, calories, or salt (sodium) in your diet. Drinking too much alcohol. Other risk factors include: Having a personal history of heart disease, diabetes, high cholesterol, or kidney disease. Stress. Having a family history of high blood pressure and high cholesterol. Having obstructive sleep apnea. Age. The risk increases with age. What are the signs or symptoms? High blood pressure may not cause symptoms. Very high blood pressure (hypertensive crisis) may  cause: Headache. Fast or irregular heartbeats (palpitations). Shortness of breath. Nosebleed. Nausea and vomiting. Vision changes. Severe chest pain, dizziness, and seizures. How is this diagnosed? This condition is diagnosed by measuring your blood pressure while you are seated, with your arm resting on a flat surface, your legs uncrossed, and your feet flat on the floor. The cuff of the blood pressure monitor will be placed directly against the skin of your upper arm at the level of your heart. Blood pressure should be measured at least twice using the same arm. Certain conditions can cause a difference in blood pressure between your right and left arms. If you have a high blood pressure reading during one visit or you have normal blood pressure with other risk factors, you may be asked to: Return on a different day to have your blood pressure checked again. Monitor your blood pressure at home for 1 week or longer. If you are diagnosed with hypertension, you may have other blood or imaging tests to help your health care provider understand your overall risk for other conditions. How is this treated? This condition is treated by making healthy lifestyle changes, such as eating healthy foods, exercising more, and reducing your alcohol intake. You may be referred for counseling on a healthy diet and physical activity. Your health care provider may prescribe medicine if lifestyle changes are not enough to get your blood pressure under control and if: Your systolic blood pressure is above 130. Your diastolic blood pressure is above 80. Your personal target blood pressure may vary depending on your medical conditions, your age, and other factors. Follow these instructions at home: Eating and drinking  Eat a diet that is high in fiber and potassium, and low in sodium, added sugar, and fat. An example of this eating plan is called the DASH diet. DASH stands for Dietary Approaches to Stop  Hypertension. To eat this way: Eat plenty of fresh fruits and vegetables. Try to fill one half of your plate at each meal with fruits and vegetables. Eat whole grains, such as whole-wheat pasta, brown rice, or whole-grain bread. Fill about one fourth of your plate with whole grains. Eat or drink low-fat dairy products, such as skim milk or low-fat yogurt. Avoid fatty cuts of meat, processed or cured meats, and poultry with skin. Fill about one fourth of your plate with lean proteins, such as fish, chicken without skin, beans, eggs, or tofu. Avoid pre-made and processed foods. These tend to be higher in sodium, added sugar, and fat. Reduce your daily sodium intake. Many people with hypertension should eat less than 1,500 mg of sodium a day. Do not drink alcohol if: Your health care provider tells you not to drink. You are pregnant, may be pregnant, or are planning to become pregnant. If you drink alcohol: Limit how much you have to: 0-1 drink a day for women.  0-2 drinks a day for men. Know how much alcohol is in your drink. In the [place] one drink equals one 12 oz bottle of beer (355 mL), one 5 oz glass of wine (148 mL), or one 1 oz glass of hard liquor (44 mL). Lifestyle  Work with your health care provider to maintain a healthy body weight or to lose weight. Ask what an ideal weight is for you. Get at least 30 minutes of exercise that causes your heart to beat faster (aerobic exercise) most days of the week. Activities may include walking, swimming, or biking. Include exercise to strengthen your muscles (resistance exercise), such as Pilates or lifting weights, as part of your weekly exercise routine. Try to do these types of exercises for 30 minutes at least 3 days a week. Do not use any products that contain nicotine or tobacco. These products include cigarettes, chewing tobacco, and vaping devices, such as e-cigarettes. If you need help quitting, ask your health care provider. Monitor your  blood pressure at home as told by your health care provider. Keep all follow-up visits. This is important. Medicines Take over-the-counter and prescription medicines only as told by your health care provider. Follow directions carefully. Blood pressure medicines must be taken as prescribed. Do not skip doses of blood pressure medicine. Doing this puts you at risk for problems and can make the medicine less effective. Ask your health care provider about side effects or reactions to medicines that you should watch for. Contact a health care provider if you: Think you are having a reaction to a medicine you are taking. Have headaches that keep coming back (recurring). Feel dizzy. Have swelling in your ankles. Have trouble with your vision. Get help right away if you: Develop a severe headache or confusion. Have unusual weakness or numbness. Feel faint. Have severe pain in your chest or abdomen. Vomit repeatedly. Have trouble breathing. These symptoms may be an emergency. Get help right away. Call 911. Do not wait to see if the symptoms will go away. Do not drive yourself to the hospital. Summary Hypertension is when the force of blood pumping through your arteries is too strong. If this condition is not controlled, it may put you at risk for serious complications. Your personal target blood pressure may vary depending on your medical conditions, your age, and other factors. For most people, a normal blood pressure is less than 120/80. Hypertension is treated with lifestyle changes, medicines, or a combination of both. Lifestyle changes include losing weight, eating a healthy, low-sodium diet, exercising more, and limiting alcohol. This information is not intended to replace advice given to you by your health care provider. Make sure you discuss any questions you have with your health care provider. Document Revised: 03/11/2021 Document Reviewed: 03/11/2021 Elsevier Patient Education  2024  [employer]    Emil Schaumann, MD [facility]

## 2024-03-20 NOTE — Assessment & Plan Note (Signed)
 Chronic stable condition Continue atorvastatin  40 mg daily Also taking Repatha  140 mg every 14 days

## 2024-03-20 NOTE — Patient Instructions (Signed)
 Hypertension, Adult High blood pressure (hypertension) is when the force of blood pumping through the arteries is too strong. The arteries are the blood vessels that carry blood from the heart throughout the body. Hypertension forces the heart to work harder to pump blood and may cause arteries to become narrow or stiff. Untreated or uncontrolled hypertension can lead to a heart attack, heart failure, a stroke, kidney disease, and other problems. A blood pressure reading consists of a higher number over a lower number. Ideally, your blood pressure should be below 120/80. The first ("top") number is called the systolic pressure. It is a measure of the pressure in your arteries as your heart beats. The second ("bottom") number is called the diastolic pressure. It is a measure of the pressure in your arteries as the heart relaxes. What are the causes? The exact cause of this condition is not known. There are some conditions that result in high blood pressure. What increases the risk? Certain factors may make you more likely to develop high blood pressure. Some of these risk factors are under your control, including: Smoking. Not getting enough exercise or physical activity. Being overweight. Having too much fat, sugar, calories, or salt (sodium) in your diet. Drinking too much alcohol. Other risk factors include: Having a personal history of heart disease, diabetes, high cholesterol, or kidney disease. Stress. Having a family history of high blood pressure and high cholesterol. Having obstructive sleep apnea. Age. The risk increases with age. What are the signs or symptoms? High blood pressure may not cause symptoms. Very high blood pressure (hypertensive crisis) may cause: Headache. Fast or irregular heartbeats (palpitations). Shortness of breath. Nosebleed. Nausea and vomiting. Vision changes. Severe chest pain, dizziness, and seizures. How is this diagnosed? This condition is diagnosed by  measuring your blood pressure while you are seated, with your arm resting on a flat surface, your legs uncrossed, and your feet flat on the floor. The cuff of the blood pressure monitor will be placed directly against the skin of your upper arm at the level of your heart. Blood pressure should be measured at least twice using the same arm. Certain conditions can cause a difference in blood pressure between your right and left arms. If you have a high blood pressure reading during one visit or you have normal blood pressure with other risk factors, you may be asked to: Return on a different day to have your blood pressure checked again. Monitor your blood pressure at home for 1 week or longer. If you are diagnosed with hypertension, you may have other blood or imaging tests to help your health care provider understand your overall risk for other conditions. How is this treated? This condition is treated by making healthy lifestyle changes, such as eating healthy foods, exercising more, and reducing your alcohol intake. You may be referred for counseling on a healthy diet and physical activity. Your health care provider may prescribe medicine if lifestyle changes are not enough to get your blood pressure under control and if: Your systolic blood pressure is above 130. Your diastolic blood pressure is above 80. Your personal target blood pressure may vary depending on your medical conditions, your age, and other factors. Follow these instructions at home: Eating and drinking  Eat a diet that is high in fiber and potassium, and low in sodium, added sugar, and fat. An example of this eating plan is called the DASH diet. DASH stands for Dietary Approaches to Stop Hypertension. To eat this way: Eat  plenty of fresh fruits and vegetables. Try to fill one half of your plate at each meal with fruits and vegetables. Eat whole grains, such as whole-wheat pasta, brown rice, or whole-grain bread. Fill about one  fourth of your plate with whole grains. Eat or drink low-fat dairy products, such as skim milk or low-fat yogurt. Avoid fatty cuts of meat, processed or cured meats, and poultry with skin. Fill about one fourth of your plate with lean proteins, such as fish, chicken without skin, beans, eggs, or tofu. Avoid pre-made and processed foods. These tend to be higher in sodium, added sugar, and fat. Reduce your daily sodium intake. Many people with hypertension should eat less than 1,500 mg of sodium a day. Do not drink alcohol if: Your health care provider tells you not to drink. You are pregnant, may be pregnant, or are planning to become pregnant. If you drink alcohol: Limit how much you have to: 0-1 drink a day for women. 0-2 drinks a day for men. Know how much alcohol is in your drink. In the [place] one drink equals one 12 oz bottle of beer (355 mL), one 5 oz glass of wine (148 mL), or one 1 oz glass of hard liquor (44 mL). Lifestyle  Work with your health care provider to maintain a healthy body weight or to lose weight. Ask what an ideal weight is for you. Get at least 30 minutes of exercise that causes your heart to beat faster (aerobic exercise) most days of the week. Activities may include walking, swimming, or biking. Include exercise to strengthen your muscles (resistance exercise), such as Pilates or lifting weights, as part of your weekly exercise routine. Try to do these types of exercises for 30 minutes at least 3 days a week. Do not use any products that contain nicotine or tobacco. These products include cigarettes, chewing tobacco, and vaping devices, such as e-cigarettes. If you need help quitting, ask your health care provider. Monitor your blood pressure at home as told by your health care provider. Keep all follow-up visits. This is important. Medicines Take over-the-counter and prescription medicines only as told by your health care provider. Follow directions carefully. Blood  pressure medicines must be taken as prescribed. Do not skip doses of blood pressure medicine. Doing this puts you at risk for problems and can make the medicine less effective. Ask your health care provider about side effects or reactions to medicines that you should watch for. Contact a health care provider if you: Think you are having a reaction to a medicine you are taking. Have headaches that keep coming back (recurring). Feel dizzy. Have swelling in your ankles. Have trouble with your vision. Get help right away if you: Develop a severe headache or confusion. Have unusual weakness or numbness. Feel faint. Have severe pain in your chest or abdomen. Vomit repeatedly. Have trouble breathing. These symptoms may be an emergency. Get help right away. Call 911. Do not wait to see if the symptoms will go away. Do not drive yourself to the hospital. Summary Hypertension is when the force of blood pumping through your arteries is too strong. If this condition is not controlled, it may put you at risk for serious complications. Your personal target blood pressure may vary depending on your medical conditions, your age, and other factors. For most people, a normal blood pressure is less than 120/80. Hypertension is treated with lifestyle changes, medicines, or a combination of both. Lifestyle changes include losing weight, eating a healthy,  low-sodium diet, exercising more, and limiting alcohol. This information is not intended to replace advice given to you by your health care provider. Make sure you discuss any questions you have with your health care provider. Document Revised: 03/11/2021 Document Reviewed: 03/11/2021 Elsevier Patient Education  2024 [employer].

## 2024-03-21 DIAGNOSIS — M5459 Other low back pain: Secondary | ICD-10-CM | POA: Diagnosis not present

## 2024-03-21 DIAGNOSIS — M5416 Radiculopathy, lumbar region: Secondary | ICD-10-CM | POA: Diagnosis not present

## 2024-03-23 ENCOUNTER — Ambulatory Visit (INDEPENDENT_AMBULATORY_CARE_PROVIDER_SITE_OTHER): Admitting: Allergy & Immunology

## 2024-03-23 ENCOUNTER — Encounter: Payer: Self-pay | Admitting: Allergy & Immunology

## 2024-03-23 VITALS — BP 118/72 | HR 56 | Temp 98.3°F | Wt 228.2 lb

## 2024-03-23 DIAGNOSIS — J302 Other seasonal allergic rhinitis: Secondary | ICD-10-CM

## 2024-03-23 DIAGNOSIS — J3089 Other allergic rhinitis: Secondary | ICD-10-CM

## 2024-03-23 DIAGNOSIS — K219 Gastro-esophageal reflux disease without esophagitis: Secondary | ICD-10-CM | POA: Diagnosis not present

## 2024-03-23 DIAGNOSIS — J4540 Moderate persistent asthma, uncomplicated: Secondary | ICD-10-CM

## 2024-03-23 DIAGNOSIS — M5459 Other low back pain: Secondary | ICD-10-CM | POA: Diagnosis not present

## 2024-03-23 DIAGNOSIS — M5416 Radiculopathy, lumbar region: Secondary | ICD-10-CM | POA: Diagnosis not present

## 2024-03-23 MED ORDER — FEXOFENADINE HCL 180 MG PO TABS: 180.0000 mg | ORAL_TABLET | Freq: Every day | ORAL | 3 refills | Status: AC

## 2024-03-23 NOTE — Progress Notes (Signed)
 FOLLOW UP  Date of Service/Encounter:  03/23/24   Assessment:   Seasonal and perennial allergic rhinitis   Moderate persistent asthma, uncomplicated   Gastroesophageal reflux disease   Stepping down therapy     Plan/Recommendations:   1. Moderate persistent asthma, uncomplicated - Lung testing looked excellent today.  - Daily controller medication(s): NOTHING  - Prior to physical activity: albuterol  2 puffs 10-15 minutes before physical activity. - Rescue medications: albuterol  4 puffs every 4-6 hours as needed - Asthma control goals:  * Full participation in all desired activities (may need albuterol  before activity) * Albuterol  use two time or less a week on average (not counting use with activity) * Cough interfering with sleep two time or less a month * Oral steroids no more than once a year * No hospitalizations  2. Seasonal and perennial allergic rhinitis (grasses, ragweed, trees, indoor molds, outdoor molds, cat and dog) - Continue with: Allegra  (fexofenadine ) 180mg  tablet once daily - Continue with: Flonase  (fluticasone ) two sprays per nostril daily and   Allergy  shots might be the best treatment option in the long run since that can decrease mucous production, but we will keep this in our back pocket.  - Wait three months and then STOP the Flonase  (fluticasone ) and see how you do.   3. Reflux - Continue dietary and lifestyle modifications as listed below - Continue with omeprazole  daily.   4. Return in about 1 year (around 03/23/2025). You can have the follow up appointment with Dr. Iva or a Nurse Practicioner (our Nurse Practitioners are excellent and always have Physician oversight!).    Subjective:   Adam Peters is a 73 y.o. male presenting today for follow up of  Chief Complaint  Patient presents with   Follow-up    Pt is here for his 44mo f/u appt. He states his asthma and allergy  symptoms have been stable and not bothersome as they were  in the past.     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 02/17/2024   Degenerative disc disease, thoracic 02/17/2024   Gastroesophageal reflux disease 07/03/2022   Cognitive changes 05/20/2022   Peripheral arterial disease 03/03/2022   Intermittent claudication 02/16/2022   Chronic right shoulder pain 02/16/2022   BPH with obstruction/lower urinary tract symptoms 09/08/2021   Moderate persistent asthma, uncomplicated 07/03/2021   OSA on CPAP 03/10/2021   Asthma 10/03/2020   Seasonal and perennial allergic rhinitis 04/04/2019   Ex-smoker 04/04/2019   Mild persistent asthma, uncomplicated 04/04/2019   Essential hypertension    Dyslipidemia    History of tobacco use 07/08/2012   Glaucoma 04/08/2012   DDD (degenerative disc disease), cervical    CAD- LAD BMS '97, RCA DES 08/02/13 08/17/2011    History obtained from: chart review and patient.  Discussed the use of AI scribe software for clinical note transcription with the patient and/or guardian, who gave verbal consent to proceed.  Adam Peters is a 73 y.o. male presenting for a follow up visit.  He was last seen in April 2025.  At that time, lung testing looked great.  We continue with Stiolto 2 puffs once daily as well as albuterol  as needed.  For his allergic rhinitis, we stopped the Astelin  and continue with Allegra  and Flonase .  We talked about allergy  shots for long-term control.  We also gave him plans  to stop his Flonase  if cessation of Astelin  was tolerated.  For his reflux, he continue with Prilosec.    Since last visit, he has done very well.   Asthma/Respiratory Symptom History: He has not needed his rescue inhaler recently. He previously used [employer] but is no longer on it.  Allergic Rhinitis Symptom History: His environmental allergies are well-controlled with daily use of  Allegra , which he finds effective without causing drowsiness.  He has not had antibiotics for any sinus infections recently.  Skin Symptom History: He has eczema on his ears, for which he uses a cream at home and does not require a refill.  GERD Symptom History: He experiences persistent heartburn, managed with omeprazole .  He has a history of back surgery where silicone was placed between two discs to alleviate pain. The procedure was painful but effective. He has not been active at the [facility] for two months due to the surgery.  He is on Repatha , an injectable medication for high cholesterol, administered every two weeks.  Otherwise, there have been no changes to his past medical history, surgical history, family history, or social history.    Review of systems otherwise negative other than that mentioned in the HPI.    Objective:   Blood pressure 118/72, pulse (!) 56, temperature 98.3 F (36.8 C), temperature source Temporal, weight 228 lb 3.2 oz (103.5 kg), SpO2 99%. Body mass index is 29.3 kg/m.    Physical Exam Vitals reviewed.  Constitutional:      Appearance: He is well-developed.     Comments: Pleasant.  Cooperative.  HENT:     Head: Normocephalic and atraumatic.     Right Ear: Tympanic membrane, ear canal and external ear normal.     Left Ear: Tympanic membrane, ear canal and external ear normal.     Nose: No nasal deformity, septal deviation, mucosal edema or rhinorrhea.     Right Turbinates: Enlarged, swollen and pale.     Left Turbinates: Enlarged, swollen and pale.     Right Sinus: No maxillary sinus tenderness or frontal sinus tenderness.     Left Sinus: No maxillary sinus tenderness or frontal sinus tenderness.     Comments: No polyps. Nares largely clear today.     Mouth/Throat:     Mouth: Mucous membranes are not pale and not dry.     Pharynx: Uvula midline.  Eyes:     General: Lids are normal. No allergic shiner.       Right eye: No discharge.         Left eye: No discharge.     Conjunctiva/sclera: Conjunctivae normal.     Right eye: Right conjunctiva is not injected. No chemosis.    Left eye: Left conjunctiva is not injected. No chemosis.    Pupils: Pupils are equal, round, and reactive to light.  Cardiovascular:     Rate and Rhythm: Normal rate and regular rhythm.     Heart sounds: Normal heart sounds.  Pulmonary:     Effort: Pulmonary effort is normal. No tachypnea, accessory muscle usage or respiratory distress.     Breath sounds: Normal breath sounds. No wheezing, rhonchi or rales.     Comments: Moving air well in all lung fields.  No increased work of breathing. Chest:     Chest wall: No tenderness.  Lymphadenopathy:     Cervical: No cervical adenopathy.  Skin:    General: Skin is warm.     Capillary Refill: Capillary refill takes less than 2 seconds.  Coloration: Skin is not pale.     Findings: No abrasion, erythema, petechiae or rash. Rash is not papular, urticarial or vesicular.     Comments: No eczematous or urticarial lesions noted.  Neurological:     Mental Status: He is alert.  Psychiatric:        Behavior: Behavior is cooperative.      Diagnostic studies:    Spirometry: results normal (FEV1: 2.55/86%, FVC: 3.44/87%, FEV1/FVC: 74%).    Spirometry consistent with normal pattern.   Allergy  Studies: none      Marty Shaggy, MD  [facility] 

## 2024-03-23 NOTE — Patient Instructions (Addendum)
 1. Moderate persistent asthma, uncomplicated - Lung testing looked excellent today.  - Daily controller medication(s): NOTHING  - Prior to physical activity: albuterol  2 puffs 10-15 minutes before physical activity. - Rescue medications: albuterol  4 puffs every 4-6 hours as needed - Asthma control goals:  * Full participation in all desired activities (may need albuterol  before activity) * Albuterol  use two time or less a week on average (not counting use with activity) * Cough interfering with sleep two time or less a month * Oral steroids no more than once a year * No hospitalizations  2. Seasonal and perennial allergic rhinitis (grasses, ragweed, trees, indoor molds, outdoor molds, cat and dog) - Continue with: Allegra  (fexofenadine ) 180mg  tablet once daily - Continue with: Flonase  (fluticasone ) two sprays per nostril daily and   Allergy  shots might be the best treatment option in the long run since that can decrease mucous production, but we will keep this in our back pocket.  - Wait three months and then STOP the Flonase  (fluticasone ) and see how you do.   3. Reflux - Continue dietary and lifestyle modifications as listed below - Continue with omeprazole  daily.   4. Return in about 1 year (around 03/23/2025). You can have the follow up appointment with Dr. Iva or a Nurse Practicioner (our Nurse Practitioners are excellent and always have Physician oversight!).    Please inform us  of any Emergency Department visits, hospitalizations, or changes in symptoms. Call us  before going to the ED for breathing or allergy  symptoms since we might be able to fit you in for a sick visit. Feel free to contact us  anytime with any questions, problems, or concerns.  It was a pleasure to see you again today!  Websites that have reliable patient information: 1. American Academy of Asthma, Allergy , and Immunology: www.aaaai.org 2. Food Allergy  Research and Education (FARE): foodallergy.org 3.  Mothers of Asthmatics: http://www.asthmacommunitynetwork.org 4. [employer] of Allergy , Asthma, and Immunology: www.acaai.org      "Like" us  on Facebook and Instagram for our latest updates!      A healthy democracy works best when [employer] participate! Make sure you are registered to vote! If you have moved or changed any of your contact information, you will need to get this updated before voting! Scan the QR codes below to learn more!

## 2024-03-28 ENCOUNTER — Other Ambulatory Visit: Payer: Self-pay | Admitting: Internal Medicine

## 2024-03-29 DIAGNOSIS — M5459 Other low back pain: Secondary | ICD-10-CM | POA: Diagnosis not present

## 2024-03-29 DIAGNOSIS — M5416 Radiculopathy, lumbar region: Secondary | ICD-10-CM | POA: Diagnosis not present

## 2024-03-30 ENCOUNTER — Ambulatory Visit: Attending: Internal Medicine | Admitting: Internal Medicine

## 2024-03-30 DIAGNOSIS — M5416 Radiculopathy, lumbar region: Secondary | ICD-10-CM | POA: Diagnosis not present

## 2024-03-30 DIAGNOSIS — M5459 Other low back pain: Secondary | ICD-10-CM | POA: Diagnosis not present

## 2024-03-31 ENCOUNTER — Encounter: Payer: Self-pay | Admitting: Internal Medicine

## 2024-04-03 ENCOUNTER — Telehealth: Payer: Self-pay | Admitting: Allergy & Immunology

## 2024-04-03 MED ORDER — ALBUTEROL SULFATE HFA 108 (90 BASE) MCG/ACT IN AERS: 2.0000 | INHALATION_SPRAY | Freq: Four times a day (QID) | RESPIRATORY_TRACT | 2 refills | Status: AC | PRN

## 2024-04-03 NOTE — Telephone Encounter (Signed)
 Payne called and stated that per his last appointment he was to have been prescribed an Albuterol  rescue inhaler. He states he needs this to be sent in to [facility]. Best contact [phone]

## 2024-04-03 NOTE — Telephone Encounter (Signed)
 Albuterol  sent in. I called the patient and left a message to call the office back to inform.

## 2024-04-03 NOTE — Addendum Note (Signed)
 Addended by: MENDEZ-MUNGARAY, Rihanna Marseille M on: 04/03/2024 03:51 PM   Modules accepted: Orders

## 2024-04-06 DIAGNOSIS — M5416 Radiculopathy, lumbar region: Secondary | ICD-10-CM | POA: Diagnosis not present

## 2024-04-06 DIAGNOSIS — M5459 Other low back pain: Secondary | ICD-10-CM | POA: Diagnosis not present

## 2024-04-20 DIAGNOSIS — M5416 Radiculopathy, lumbar region: Secondary | ICD-10-CM | POA: Diagnosis not present

## 2024-04-20 DIAGNOSIS — M5459 Other low back pain: Secondary | ICD-10-CM | POA: Diagnosis not present

## 2024-05-02 DIAGNOSIS — M5416 Radiculopathy, lumbar region: Secondary | ICD-10-CM | POA: Diagnosis not present

## 2024-05-02 DIAGNOSIS — M5459 Other low back pain: Secondary | ICD-10-CM | POA: Diagnosis not present

## 2024-06-09 ENCOUNTER — Other Ambulatory Visit: Payer: Self-pay | Admitting: Internal Medicine

## 2024-06-09 ENCOUNTER — Telehealth: Payer: Self-pay | Admitting: Internal Medicine

## 2024-06-09 MED ORDER — REPATHA SURECLICK 140 MG/ML ~~LOC~~ SOAJ: 140.0000 mg | SUBCUTANEOUS | 0 refills | Status: AC

## 2024-06-09 NOTE — Telephone Encounter (Signed)
" °*  STAT* If patient is at the pharmacy, call can be transferred to refill team.   1. Which medications need to be refilled? (please list name of each medication and dose if known)   REPATHA  SURECLICK 140 MG/ML SOAJ     2. Would you like to learn more about the convenience, safety, & potential cost savings by using the [facility]? No    3. Are you open to using the [facility] (Type [facility].  ). No    4. Which pharmacy/location (including street and city if local pharmacy) is medication to be sent to?[facility] - [place], [place] - [address]    5. Do they need a 30 day or 90 day supply?   "

## 2024-06-13 ENCOUNTER — Telehealth: Payer: Self-pay | Admitting: Internal Medicine

## 2024-06-13 MED ORDER — OMEPRAZOLE 20 MG PO CPDR: 40.0000 mg | DELAYED_RELEASE_CAPSULE | Freq: Two times a day (BID) | ORAL | 0 refills | Status: AC

## 2024-06-13 NOTE — Telephone Encounter (Signed)
 Pt c/o medication issue:  1. Name of Medication:   omeprazole  (PRILOSEC) 20 MG capsule    2. How are you currently taking this medication (dosage and times per day)? As prescribed   3. Are you having a reaction (difficulty breathing--STAT)? no  4. What is your medication issue? [facility]. Pt has appt scheduled 2/16

## 2024-06-13 NOTE — Telephone Encounter (Signed)
 Refill until appointment, sent.

## 2024-07-03 ENCOUNTER — Ambulatory Visit: Admitting: Internal Medicine

## 2024-09-19 ENCOUNTER — Ambulatory Visit: Admitting: Emergency Medicine

## 2024-11-23 ENCOUNTER — Ambulatory Visit

## 2024-12-26 ENCOUNTER — Ambulatory Visit: Admitting: Psychology

## 2025-03-22 ENCOUNTER — Ambulatory Visit: Admitting: Allergy & Immunology
